# Patient Record
Sex: Male | Born: 1935 | Race: White | Hispanic: No | Marital: Married | Smoking: Former smoker
Health system: Southern US, Community
[De-identification: ages and names within clinical notes are randomized; demographics above are authoritative.]

## PROBLEM LIST (undated history)

## (undated) DIAGNOSIS — R252 Cramp and spasm: Secondary | ICD-10-CM

## (undated) DIAGNOSIS — I272 Pulmonary hypertension, unspecified: Secondary | ICD-10-CM

## (undated) DIAGNOSIS — I452 Bifascicular block: Secondary | ICD-10-CM

## (undated) DIAGNOSIS — R351 Nocturia: Secondary | ICD-10-CM

## (undated) DIAGNOSIS — B029 Zoster without complications: Secondary | ICD-10-CM

## (undated) DIAGNOSIS — I340 Nonrheumatic mitral (valve) insufficiency: Secondary | ICD-10-CM

## (undated) DIAGNOSIS — E785 Hyperlipidemia, unspecified: Secondary | ICD-10-CM

## (undated) DIAGNOSIS — I5020 Unspecified systolic (congestive) heart failure: Secondary | ICD-10-CM

## (undated) DIAGNOSIS — R7989 Other specified abnormal findings of blood chemistry: Secondary | ICD-10-CM

## (undated) DIAGNOSIS — H9190 Unspecified hearing loss, unspecified ear: Secondary | ICD-10-CM

## (undated) DIAGNOSIS — R001 Bradycardia, unspecified: Secondary | ICD-10-CM

## (undated) DIAGNOSIS — Z951 Presence of aortocoronary bypass graft: Secondary | ICD-10-CM

## (undated) DIAGNOSIS — R9430 Abnormal result of cardiovascular function study, unspecified: Secondary | ICD-10-CM

## (undated) DIAGNOSIS — R945 Abnormal results of liver function studies: Secondary | ICD-10-CM

## (undated) DIAGNOSIS — R42 Dizziness and giddiness: Secondary | ICD-10-CM

## (undated) DIAGNOSIS — I10 Essential (primary) hypertension: Secondary | ICD-10-CM

## (undated) DIAGNOSIS — R748 Abnormal levels of other serum enzymes: Secondary | ICD-10-CM

## (undated) DIAGNOSIS — E039 Hypothyroidism, unspecified: Secondary | ICD-10-CM

## (undated) DIAGNOSIS — I998 Other disorder of circulatory system: Secondary | ICD-10-CM

## (undated) DIAGNOSIS — I351 Nonrheumatic aortic (valve) insufficiency: Secondary | ICD-10-CM

## (undated) DIAGNOSIS — IMO0002 Reserved for concepts with insufficient information to code with codable children: Secondary | ICD-10-CM

## (undated) DIAGNOSIS — I219 Acute myocardial infarction, unspecified: Secondary | ICD-10-CM

## (undated) DIAGNOSIS — I2510 Atherosclerotic heart disease of native coronary artery without angina pectoris: Secondary | ICD-10-CM

## (undated) DIAGNOSIS — Z973 Presence of spectacles and contact lenses: Secondary | ICD-10-CM

## (undated) DIAGNOSIS — M1990 Unspecified osteoarthritis, unspecified site: Secondary | ICD-10-CM

## (undated) HISTORY — DX: Presence of aortocoronary bypass graft: Z95.1

## (undated) HISTORY — DX: Bifascicular block: I45.2

## (undated) HISTORY — DX: Pulmonary hypertension, unspecified: I27.20

## (undated) HISTORY — DX: Bradycardia, unspecified: R00.1

## (undated) HISTORY — DX: Atherosclerotic heart disease of native coronary artery without angina pectoris: I25.10

## (undated) HISTORY — DX: Unspecified systolic (congestive) heart failure: I50.20

## (undated) HISTORY — DX: Zoster without complications: B02.9

## (undated) HISTORY — DX: Nonrheumatic aortic (valve) insufficiency: I35.1

## (undated) HISTORY — DX: Nonrheumatic mitral (valve) insufficiency: I34.0

## (undated) HISTORY — DX: Hypothyroidism, unspecified: E03.9

## (undated) HISTORY — DX: Hyperlipidemia, unspecified: E78.5

## (undated) HISTORY — DX: Abnormal levels of other serum enzymes: R74.8

## (undated) HISTORY — DX: Cramp and spasm: R25.2

## (undated) HISTORY — DX: Reserved for concepts with insufficient information to code with codable children: IMO0002

## (undated) HISTORY — DX: Other specified abnormal findings of blood chemistry: R79.89

## (undated) HISTORY — DX: Dizziness and giddiness: R42

## (undated) HISTORY — PX: CARDIAC SURGERY: SHX584

## (undated) HISTORY — DX: Acute myocardial infarction, unspecified: I21.9

## (undated) HISTORY — DX: Essential (primary) hypertension: I10

## (undated) HISTORY — DX: Other disorder of circulatory system: I99.8

## (undated) HISTORY — DX: Abnormal results of liver function studies: R94.5

## (undated) HISTORY — DX: Abnormal result of cardiovascular function study, unspecified: R94.30

---

## 1994-05-17 HISTORY — PX: CORONARY ARTERY BYPASS GRAFT: SHX141

## 1997-05-17 HISTORY — PX: BACK SURGERY: SHX140

## 1997-10-22 ENCOUNTER — Inpatient Hospital Stay (HOSPITAL_COMMUNITY): Admission: RE | Admit: 1997-10-22 | Discharge: 1997-10-24 | Payer: Self-pay | Admitting: Neurosurgery

## 1998-01-29 ENCOUNTER — Encounter: Admission: RE | Admit: 1998-01-29 | Discharge: 1998-04-29 | Payer: Self-pay | Admitting: Anesthesiology

## 1998-03-07 ENCOUNTER — Encounter: Admission: RE | Admit: 1998-03-07 | Discharge: 1998-06-05 | Payer: Self-pay | Admitting: Anesthesiology

## 1998-05-01 ENCOUNTER — Encounter: Admission: RE | Admit: 1998-05-01 | Discharge: 1998-07-30 | Payer: Self-pay | Admitting: Anesthesiology

## 1998-05-17 HISTORY — PX: SPINAL FUSION: SHX223

## 2001-06-24 ENCOUNTER — Emergency Department (HOSPITAL_COMMUNITY): Admission: EM | Admit: 2001-06-24 | Discharge: 2001-06-24 | Payer: Self-pay | Admitting: Emergency Medicine

## 2001-06-24 ENCOUNTER — Encounter: Payer: Self-pay | Admitting: Emergency Medicine

## 2003-09-27 ENCOUNTER — Emergency Department (HOSPITAL_COMMUNITY): Admission: EM | Admit: 2003-09-27 | Discharge: 2003-09-28 | Payer: Self-pay | Admitting: Emergency Medicine

## 2004-03-12 ENCOUNTER — Encounter: Admission: RE | Admit: 2004-03-12 | Discharge: 2004-03-12 | Payer: Self-pay | Admitting: Internal Medicine

## 2004-06-09 ENCOUNTER — Ambulatory Visit: Payer: Self-pay | Admitting: Cardiology

## 2004-06-16 ENCOUNTER — Ambulatory Visit: Payer: Self-pay | Admitting: Cardiology

## 2004-07-20 ENCOUNTER — Ambulatory Visit: Payer: Self-pay | Admitting: Cardiology

## 2004-07-30 ENCOUNTER — Ambulatory Visit: Payer: Self-pay

## 2004-08-06 ENCOUNTER — Ambulatory Visit: Payer: Self-pay | Admitting: Cardiology

## 2004-08-20 ENCOUNTER — Ambulatory Visit: Payer: Self-pay | Admitting: Cardiology

## 2004-08-20 ENCOUNTER — Ambulatory Visit: Payer: Self-pay | Admitting: *Deleted

## 2004-08-20 ENCOUNTER — Ambulatory Visit (HOSPITAL_COMMUNITY): Admission: RE | Admit: 2004-08-20 | Discharge: 2004-08-20 | Payer: Self-pay | Admitting: Cardiology

## 2004-09-08 ENCOUNTER — Ambulatory Visit: Payer: Self-pay | Admitting: Cardiology

## 2004-12-01 ENCOUNTER — Ambulatory Visit: Payer: Self-pay | Admitting: Cardiology

## 2005-02-12 ENCOUNTER — Ambulatory Visit: Payer: Self-pay | Admitting: Internal Medicine

## 2005-04-05 ENCOUNTER — Ambulatory Visit: Payer: Self-pay | Admitting: Cardiology

## 2005-05-21 ENCOUNTER — Ambulatory Visit: Payer: Self-pay | Admitting: Cardiology

## 2005-07-13 ENCOUNTER — Ambulatory Visit: Payer: Self-pay | Admitting: Internal Medicine

## 2005-08-17 ENCOUNTER — Ambulatory Visit: Payer: Self-pay | Admitting: Cardiology

## 2005-09-21 ENCOUNTER — Ambulatory Visit: Payer: Self-pay | Admitting: Internal Medicine

## 2005-10-01 ENCOUNTER — Ambulatory Visit: Payer: Self-pay | Admitting: Internal Medicine

## 2005-10-14 ENCOUNTER — Ambulatory Visit: Payer: Self-pay | Admitting: Cardiology

## 2005-12-20 ENCOUNTER — Ambulatory Visit: Payer: Self-pay | Admitting: Cardiology

## 2006-07-13 ENCOUNTER — Ambulatory Visit: Payer: Self-pay | Admitting: Internal Medicine

## 2006-07-13 LAB — CONVERTED CEMR LAB
Creatinine,U: 90.6 mg/dL
Microalb Creat Ratio: 17.7 mg/g (ref 0.0–30.0)
Microalb, Ur: 1.6 mg/dL (ref 0.0–1.9)

## 2006-07-19 ENCOUNTER — Ambulatory Visit: Payer: Self-pay | Admitting: Internal Medicine

## 2006-11-22 ENCOUNTER — Ambulatory Visit: Payer: Self-pay | Admitting: Internal Medicine

## 2006-12-01 ENCOUNTER — Encounter (INDEPENDENT_AMBULATORY_CARE_PROVIDER_SITE_OTHER): Payer: Self-pay | Admitting: *Deleted

## 2006-12-12 ENCOUNTER — Ambulatory Visit: Payer: Self-pay | Admitting: Internal Medicine

## 2006-12-12 DIAGNOSIS — R42 Dizziness and giddiness: Secondary | ICD-10-CM

## 2007-01-11 ENCOUNTER — Encounter: Payer: Self-pay | Admitting: Internal Medicine

## 2007-01-13 ENCOUNTER — Telehealth (INDEPENDENT_AMBULATORY_CARE_PROVIDER_SITE_OTHER): Payer: Self-pay | Admitting: *Deleted

## 2007-01-17 ENCOUNTER — Encounter (INDEPENDENT_AMBULATORY_CARE_PROVIDER_SITE_OTHER): Payer: Self-pay | Admitting: *Deleted

## 2007-03-09 ENCOUNTER — Telehealth: Payer: Self-pay | Admitting: Internal Medicine

## 2007-03-09 ENCOUNTER — Inpatient Hospital Stay (HOSPITAL_COMMUNITY): Admission: EM | Admit: 2007-03-09 | Discharge: 2007-03-12 | Payer: Self-pay | Admitting: Emergency Medicine

## 2007-03-09 ENCOUNTER — Ambulatory Visit: Payer: Self-pay | Admitting: Cardiology

## 2007-03-14 ENCOUNTER — Ambulatory Visit: Payer: Self-pay | Admitting: Cardiology

## 2007-04-10 ENCOUNTER — Ambulatory Visit: Payer: Self-pay | Admitting: Cardiology

## 2007-06-23 ENCOUNTER — Ambulatory Visit: Payer: Self-pay | Admitting: Internal Medicine

## 2007-08-18 ENCOUNTER — Telehealth (INDEPENDENT_AMBULATORY_CARE_PROVIDER_SITE_OTHER): Payer: Self-pay | Admitting: *Deleted

## 2007-08-31 ENCOUNTER — Ambulatory Visit: Payer: Self-pay | Admitting: Cardiology

## 2007-09-26 ENCOUNTER — Telehealth (INDEPENDENT_AMBULATORY_CARE_PROVIDER_SITE_OTHER): Payer: Self-pay | Admitting: *Deleted

## 2007-10-02 ENCOUNTER — Ambulatory Visit: Payer: Self-pay | Admitting: Internal Medicine

## 2007-10-02 LAB — CONVERTED CEMR LAB
ALT: 27 units/L (ref 0–53)
AST: 24 units/L (ref 0–37)
Cholesterol: 135 mg/dL (ref 0–200)
Creatinine, Ser: 1.3 mg/dL (ref 0.4–1.5)
TSH: 3.96 microintl units/mL (ref 0.35–5.50)
Total CHOL/HDL Ratio: 4
Triglycerides: 66 mg/dL (ref 0–149)

## 2007-10-03 ENCOUNTER — Encounter: Payer: Self-pay | Admitting: Internal Medicine

## 2007-10-05 ENCOUNTER — Encounter (INDEPENDENT_AMBULATORY_CARE_PROVIDER_SITE_OTHER): Payer: Self-pay | Admitting: *Deleted

## 2007-10-16 ENCOUNTER — Ambulatory Visit: Payer: Self-pay | Admitting: Internal Medicine

## 2007-10-16 DIAGNOSIS — H40009 Preglaucoma, unspecified, unspecified eye: Secondary | ICD-10-CM | POA: Insufficient documentation

## 2007-11-16 ENCOUNTER — Ambulatory Visit: Payer: Self-pay | Admitting: Cardiology

## 2007-11-29 ENCOUNTER — Encounter: Payer: Self-pay | Admitting: Internal Medicine

## 2008-01-09 ENCOUNTER — Ambulatory Visit: Payer: Self-pay | Admitting: Internal Medicine

## 2008-01-10 LAB — CONVERTED CEMR LAB
Creatinine,U: 105.5 mg/dL
Potassium: 4 meq/L (ref 3.5–5.1)

## 2008-01-16 ENCOUNTER — Ambulatory Visit: Payer: Self-pay | Admitting: Internal Medicine

## 2008-01-16 DIAGNOSIS — E1365 Other specified diabetes mellitus with hyperglycemia: Secondary | ICD-10-CM

## 2008-01-16 DIAGNOSIS — E1351 Other specified diabetes mellitus with diabetic peripheral angiopathy without gangrene: Secondary | ICD-10-CM

## 2008-02-13 ENCOUNTER — Ambulatory Visit: Payer: Self-pay | Admitting: Cardiology

## 2008-02-13 LAB — CONVERTED CEMR LAB: Total CK: 149 units/L (ref 7–195)

## 2008-04-16 ENCOUNTER — Ambulatory Visit: Payer: Self-pay | Admitting: Internal Medicine

## 2008-04-23 ENCOUNTER — Ambulatory Visit: Payer: Self-pay | Admitting: Internal Medicine

## 2008-04-29 ENCOUNTER — Telehealth (INDEPENDENT_AMBULATORY_CARE_PROVIDER_SITE_OTHER): Payer: Self-pay | Admitting: *Deleted

## 2008-06-17 ENCOUNTER — Telehealth (INDEPENDENT_AMBULATORY_CARE_PROVIDER_SITE_OTHER): Payer: Self-pay | Admitting: *Deleted

## 2008-06-18 ENCOUNTER — Ambulatory Visit: Payer: Self-pay | Admitting: Internal Medicine

## 2008-06-19 ENCOUNTER — Ambulatory Visit: Payer: Self-pay | Admitting: Cardiology

## 2008-07-16 ENCOUNTER — Ambulatory Visit: Payer: Self-pay | Admitting: Internal Medicine

## 2008-07-16 DIAGNOSIS — T887XXA Unspecified adverse effect of drug or medicament, initial encounter: Secondary | ICD-10-CM | POA: Insufficient documentation

## 2008-07-23 LAB — CONVERTED CEMR LAB
Albumin: 4.2 g/dL (ref 3.5–5.2)
Basophils Absolute: 0 10*3/uL (ref 0.0–0.1)
Basophils Relative: 0.3 % (ref 0.0–3.0)
Cholesterol: 110 mg/dL (ref 0–200)
Creatinine,U: 90.5 mg/dL
Eosinophils Absolute: 0.1 10*3/uL (ref 0.0–0.7)
Hemoglobin: 13.5 g/dL (ref 13.0–17.0)
Hgb A1c MFr Bld: 6.2 % — ABNORMAL HIGH (ref 4.6–6.0)
MCHC: 34.2 g/dL (ref 30.0–36.0)
MCV: 93.6 fL (ref 78.0–100.0)
Microalb Creat Ratio: 110.5 mg/g — ABNORMAL HIGH (ref 0.0–30.0)
Microalb, Ur: 10 mg/dL — ABNORMAL HIGH (ref 0.0–1.9)
Neutro Abs: 4.2 10*3/uL (ref 1.4–7.7)
Neutrophils Relative %: 70.3 % (ref 43.0–77.0)
RBC: 4.22 M/uL (ref 4.22–5.81)
VLDL: 12 mg/dL (ref 0–40)

## 2008-07-24 ENCOUNTER — Encounter (INDEPENDENT_AMBULATORY_CARE_PROVIDER_SITE_OTHER): Payer: Self-pay | Admitting: *Deleted

## 2008-08-12 ENCOUNTER — Ambulatory Visit: Payer: Self-pay | Admitting: Internal Medicine

## 2008-08-12 DIAGNOSIS — D696 Thrombocytopenia, unspecified: Secondary | ICD-10-CM

## 2008-08-12 LAB — CONVERTED CEMR LAB: HDL goal, serum: 40 mg/dL

## 2008-10-04 ENCOUNTER — Encounter (INDEPENDENT_AMBULATORY_CARE_PROVIDER_SITE_OTHER): Payer: Self-pay | Admitting: *Deleted

## 2008-11-28 ENCOUNTER — Ambulatory Visit: Payer: Self-pay | Admitting: Cardiology

## 2008-11-28 DIAGNOSIS — R0602 Shortness of breath: Secondary | ICD-10-CM | POA: Insufficient documentation

## 2008-12-09 ENCOUNTER — Ambulatory Visit: Payer: Self-pay | Admitting: Internal Medicine

## 2008-12-15 LAB — CONVERTED CEMR LAB
CO2: 27 meq/L (ref 19–32)
Chloride: 107 meq/L (ref 96–112)
Sodium: 141 meq/L (ref 135–145)

## 2008-12-16 ENCOUNTER — Ambulatory Visit: Payer: Self-pay | Admitting: Internal Medicine

## 2009-02-01 ENCOUNTER — Encounter: Payer: Self-pay | Admitting: Cardiology

## 2009-02-03 ENCOUNTER — Ambulatory Visit: Payer: Self-pay | Admitting: Cardiology

## 2009-02-12 ENCOUNTER — Ambulatory Visit: Payer: Self-pay | Admitting: Internal Medicine

## 2009-02-12 DIAGNOSIS — M2550 Pain in unspecified joint: Secondary | ICD-10-CM | POA: Insufficient documentation

## 2009-02-12 DIAGNOSIS — IMO0001 Reserved for inherently not codable concepts without codable children: Secondary | ICD-10-CM

## 2009-02-14 ENCOUNTER — Telehealth (INDEPENDENT_AMBULATORY_CARE_PROVIDER_SITE_OTHER): Payer: Self-pay | Admitting: *Deleted

## 2009-02-14 LAB — CONVERTED CEMR LAB
ALT: 31 units/L (ref 0–53)
AST: 30 units/L (ref 0–37)
Alkaline Phosphatase: 61 units/L (ref 39–117)
Creatinine, Ser: 1.4 mg/dL (ref 0.4–1.5)
Eosinophils Relative: 3.2 % (ref 0.0–5.0)
HCT: 40.4 % (ref 39.0–52.0)
Hemoglobin: 13.9 g/dL (ref 13.0–17.0)
Hgb A1c MFr Bld: 6.7 % — ABNORMAL HIGH (ref 4.6–6.5)
Lymphocytes Relative: 27 % (ref 12.0–46.0)
Lymphs Abs: 1.4 10*3/uL (ref 0.7–4.0)
Monocytes Relative: 17.3 % — ABNORMAL HIGH (ref 3.0–12.0)
Neutro Abs: 2.7 10*3/uL (ref 1.4–7.7)
Platelets: 119 10*3/uL — ABNORMAL LOW (ref 150.0–400.0)
Rhuematoid fact SerPl-aCnc: 24.4 intl units/mL — ABNORMAL HIGH (ref 0.0–20.0)
Total Bilirubin: 0.8 mg/dL (ref 0.3–1.2)
Total CK: 153 units/L (ref 7–232)
Vit D, 25-Hydroxy: 25 ng/mL — ABNORMAL LOW (ref 30–89)
WBC: 5.2 10*3/uL (ref 4.5–10.5)

## 2009-02-15 ENCOUNTER — Ambulatory Visit: Payer: Self-pay | Admitting: Internal Medicine

## 2009-02-15 DIAGNOSIS — B029 Zoster without complications: Secondary | ICD-10-CM | POA: Insufficient documentation

## 2009-02-17 ENCOUNTER — Ambulatory Visit: Payer: Self-pay | Admitting: Internal Medicine

## 2009-02-17 DIAGNOSIS — E559 Vitamin D deficiency, unspecified: Secondary | ICD-10-CM | POA: Insufficient documentation

## 2009-02-18 ENCOUNTER — Ambulatory Visit: Payer: Self-pay | Admitting: Cardiology

## 2009-02-25 ENCOUNTER — Telehealth (INDEPENDENT_AMBULATORY_CARE_PROVIDER_SITE_OTHER): Payer: Self-pay | Admitting: *Deleted

## 2009-03-10 ENCOUNTER — Telehealth (INDEPENDENT_AMBULATORY_CARE_PROVIDER_SITE_OTHER): Payer: Self-pay | Admitting: *Deleted

## 2009-03-14 ENCOUNTER — Ambulatory Visit: Payer: Self-pay | Admitting: Internal Medicine

## 2009-03-15 LAB — CONVERTED CEMR LAB
BUN: 22 mg/dL (ref 6–23)
Basophils Absolute: 0 10*3/uL (ref 0.0–0.1)
Bilirubin, Direct: 0.1 mg/dL (ref 0.0–0.3)
Creatinine, Ser: 1.26 mg/dL (ref 0.40–1.50)
HCT: 38.4 % — ABNORMAL LOW (ref 39.0–52.0)
Indirect Bilirubin: 0.4 mg/dL (ref 0.0–0.9)
Lymphocytes Relative: 34 % (ref 12–46)
Lymphs Abs: 2.2 10*3/uL (ref 0.7–4.0)
Neutro Abs: 3.4 10*3/uL (ref 1.7–7.7)
Neutrophils Relative %: 51 % (ref 43–77)
Platelets: 187 10*3/uL (ref 150–400)
RDW: 14.3 % (ref 11.5–15.5)
Total Bilirubin: 0.5 mg/dL (ref 0.3–1.2)
WBC: 6.6 10*3/uL (ref 4.0–10.5)

## 2009-03-17 ENCOUNTER — Encounter (INDEPENDENT_AMBULATORY_CARE_PROVIDER_SITE_OTHER): Payer: Self-pay | Admitting: *Deleted

## 2009-03-17 ENCOUNTER — Encounter: Admission: RE | Admit: 2009-03-17 | Discharge: 2009-03-17 | Payer: Self-pay | Admitting: Internal Medicine

## 2009-03-19 ENCOUNTER — Telehealth (INDEPENDENT_AMBULATORY_CARE_PROVIDER_SITE_OTHER): Payer: Self-pay | Admitting: *Deleted

## 2009-03-26 ENCOUNTER — Ambulatory Visit: Payer: Self-pay | Admitting: Internal Medicine

## 2009-03-26 LAB — CONVERTED CEMR LAB
OCCULT 1: NEGATIVE
OCCULT 3: NEGATIVE

## 2009-03-27 ENCOUNTER — Encounter (INDEPENDENT_AMBULATORY_CARE_PROVIDER_SITE_OTHER): Payer: Self-pay | Admitting: *Deleted

## 2009-04-01 ENCOUNTER — Telehealth: Payer: Self-pay | Admitting: Internal Medicine

## 2009-04-01 ENCOUNTER — Ambulatory Visit: Payer: Self-pay | Admitting: Internal Medicine

## 2009-04-01 LAB — CONVERTED CEMR LAB
BUN: 17 mg/dL (ref 6–23)
Basophils Absolute: 0 10*3/uL (ref 0.0–0.1)
Creatinine, Ser: 1.3 mg/dL (ref 0.4–1.5)
Eosinophils Relative: 2.1 % (ref 0.0–5.0)
HCT: 37.6 % — ABNORMAL LOW (ref 39.0–52.0)
Lymphs Abs: 2 10*3/uL (ref 0.7–4.0)
Monocytes Absolute: 0.9 10*3/uL (ref 0.1–1.0)
Monocytes Relative: 11.5 % (ref 3.0–12.0)
Neutrophils Relative %: 58.6 % (ref 43.0–77.0)
Platelets: 176 10*3/uL (ref 150.0–400.0)
RDW: 15 % — ABNORMAL HIGH (ref 11.5–14.6)
WBC: 7.4 10*3/uL (ref 4.5–10.5)

## 2009-04-02 ENCOUNTER — Encounter (INDEPENDENT_AMBULATORY_CARE_PROVIDER_SITE_OTHER): Payer: Self-pay | Admitting: *Deleted

## 2009-04-04 ENCOUNTER — Ambulatory Visit: Payer: Self-pay | Admitting: Cardiology

## 2009-04-07 ENCOUNTER — Telehealth (INDEPENDENT_AMBULATORY_CARE_PROVIDER_SITE_OTHER): Payer: Self-pay | Admitting: *Deleted

## 2009-04-15 ENCOUNTER — Ambulatory Visit: Payer: Self-pay | Admitting: Cardiology

## 2009-04-17 ENCOUNTER — Ambulatory Visit: Payer: Self-pay | Admitting: Internal Medicine

## 2009-04-17 LAB — CONVERTED CEMR LAB
OCCULT 2: NEGATIVE
OCCULT 3: NEGATIVE

## 2009-04-18 ENCOUNTER — Encounter (INDEPENDENT_AMBULATORY_CARE_PROVIDER_SITE_OTHER): Payer: Self-pay | Admitting: *Deleted

## 2009-04-22 ENCOUNTER — Encounter: Payer: Self-pay | Admitting: Internal Medicine

## 2009-04-29 ENCOUNTER — Telehealth: Payer: Self-pay | Admitting: Internal Medicine

## 2009-04-29 DIAGNOSIS — B0229 Other postherpetic nervous system involvement: Secondary | ICD-10-CM

## 2009-05-13 ENCOUNTER — Encounter: Payer: Self-pay | Admitting: Internal Medicine

## 2009-06-06 ENCOUNTER — Telehealth (INDEPENDENT_AMBULATORY_CARE_PROVIDER_SITE_OTHER): Payer: Self-pay | Admitting: *Deleted

## 2009-07-07 ENCOUNTER — Telehealth (INDEPENDENT_AMBULATORY_CARE_PROVIDER_SITE_OTHER): Payer: Self-pay | Admitting: *Deleted

## 2009-07-07 ENCOUNTER — Ambulatory Visit: Payer: Self-pay | Admitting: Internal Medicine

## 2009-07-29 ENCOUNTER — Ambulatory Visit: Payer: Self-pay | Admitting: Cardiology

## 2009-09-02 ENCOUNTER — Telehealth (INDEPENDENT_AMBULATORY_CARE_PROVIDER_SITE_OTHER): Payer: Self-pay | Admitting: *Deleted

## 2009-09-08 ENCOUNTER — Ambulatory Visit: Payer: Self-pay | Admitting: Internal Medicine

## 2009-09-17 ENCOUNTER — Ambulatory Visit: Payer: Self-pay | Admitting: Internal Medicine

## 2009-10-01 ENCOUNTER — Telehealth (INDEPENDENT_AMBULATORY_CARE_PROVIDER_SITE_OTHER): Payer: Self-pay | Admitting: *Deleted

## 2009-12-25 ENCOUNTER — Ambulatory Visit: Payer: Self-pay | Admitting: Cardiology

## 2009-12-31 ENCOUNTER — Telehealth: Payer: Self-pay | Admitting: Internal Medicine

## 2010-01-12 ENCOUNTER — Ambulatory Visit: Payer: Self-pay | Admitting: Internal Medicine

## 2010-01-13 LAB — CONVERTED CEMR LAB
Alkaline Phosphatase: 43 units/L (ref 39–117)
BUN: 25 mg/dL — ABNORMAL HIGH (ref 6–23)
Bilirubin, Direct: 0.2 mg/dL (ref 0.0–0.3)
Creatinine, Ser: 1.5 mg/dL (ref 0.4–1.5)
HDL: 34.2 mg/dL — ABNORMAL LOW (ref >39.00)
Hgb A1c MFr Bld: 7.2 % — ABNORMAL HIGH (ref 4.6–6.5)
LDL Cholesterol: 91 mg/dL (ref 0–99)
Total Bilirubin: 0.8 mg/dL (ref 0.3–1.2)
Total CHOL/HDL Ratio: 4
Total Protein: 6.3 g/dL (ref 6.0–8.3)
Triglycerides: 91 mg/dL (ref 0.0–149.0)

## 2010-01-22 ENCOUNTER — Ambulatory Visit: Payer: Self-pay | Admitting: Internal Medicine

## 2010-02-09 ENCOUNTER — Encounter: Payer: Self-pay | Admitting: Internal Medicine

## 2010-02-14 DIAGNOSIS — B029 Zoster without complications: Secondary | ICD-10-CM

## 2010-02-14 HISTORY — DX: Zoster without complications: B02.9

## 2010-03-05 ENCOUNTER — Ambulatory Visit: Payer: Self-pay | Admitting: Internal Medicine

## 2010-04-02 ENCOUNTER — Ambulatory Visit: Payer: Self-pay | Admitting: Cardiology

## 2010-04-16 ENCOUNTER — Ambulatory Visit: Payer: Self-pay | Admitting: Internal Medicine

## 2010-04-20 LAB — CONVERTED CEMR LAB
Microalb Creat Ratio: 2.5 mg/g (ref 0.0–30.0)
Vit D, 25-Hydroxy: 43 ng/mL (ref 30–89)

## 2010-04-23 ENCOUNTER — Ambulatory Visit: Payer: Self-pay | Admitting: Internal Medicine

## 2010-05-05 ENCOUNTER — Telehealth: Payer: Self-pay | Admitting: Adult Health

## 2010-06-14 LAB — CONVERTED CEMR LAB
PSA: 1.34 ng/mL (ref 0.10–4.00)
Potassium: 4.8 meq/L (ref 3.5–5.1)
TSH: 3.65 microintl units/mL (ref 0.35–5.50)

## 2010-06-16 NOTE — Assessment & Plan Note (Signed)
Summary: per check out/saf      Allergies Added:   Visit Type:  Follow-up Primary Provider:  Burke Keels, MD  CC:  CAD.  History of Present Illness: The patient is seen for followup of coronary artery disease.  Fortunately his episode of shingles has resolved completely.  He is doing well.  He's not having any significant chest pain or shortness of breath.  Current Medications (verified): 1)  Levothyroxine Sodium 25 Mcg Tabs (Levothyroxine Sodium) .Marland Kitchen.. 1 By Mouth Qd 2)  Benazepril Hcl 40 Mg  Tabs (Benazepril Hcl) .... Once Daily 3)  Clonidine Hcl 0.1 Mg  Tabs (Clonidine Hcl) .Marland Kitchen.. 1 Am, 2 Evening 4)  Metformin Hcl 500 Mg Tabs (Metformin Hcl) .... Take One Tablet Two Times A Day With Largest Meals 5)  Accu-Chek Active   Strp (Glucose Blood) .... Use One Strip Daily To Test For Sugar Level 250.00 6)  Accu-Chek Soft Touch Lancets   Misc (Lancets) .... Use Daily To Check Sugar Level 7)  Carvedilol 3.125 Mg Tabs (Carvedilol) .... Take One Tablet By Mouth Twice A Day 8)  Glimepiride 2 Mg  Tabs (Glimepiride) .... 1/2 Q Am 9)  Bayer Aspirin 325 Mg Tabs (Aspirin) .Marland Kitchen.. 1 By Mouth Once Daily 10)  Vitamin D (Ergocalciferol) 50000 Unit Caps (Ergocalciferol) .Marland Kitchen.. 1 Pill Once Weekly- Due For Lab 11)  Acetaminophen 650 Mg  Tbcr (Acetaminophen) .... As Needed  Allergies (verified): 1)  ! Folic Acid 2)  ! Cardene  Past History:  Past Medical History: CAD.Marland KitchenMarland KitchenMarland KitchenBare metal stent SVG  Cx  2008 CABG Diabetes mellitus, type II Hyperlipidemia Hypertension Hypothyroidism Dizziness...mild Bradycardia...sinus RBBB.Marland Kitchen new November 28, 2008 Shortness of breath Leg and back cramps... mostly nighttime Shingles... with severe pain in the right flank....symptoms resolved. Question of swelling in the area of the shingles in the right flank.  Review of Systems       Patient denies fever, chills, headache, sweats, rash, change in vision, change in hearing, chest pain, cough, shortness of breath, nausea  vomiting, urinary symptoms.all other systems are reviewed and are negative  Vital Signs:  Patient profile:   77 year old male Height:      69 inches Weight:      185 pounds BMI:     27.42 Pulse rate:   60 / minute BP sitting:   146 / 82  (left arm) Cuff size:   regular  Vitals Entered By: Hardin Negus, RMA (July 29, 2009 9:25 AM)  Physical Exam  General:  patient looks quite good today. Eyes:  no xanthelasma. Neck:  no jugular venous distention. Lungs:  lungs are clear.  Respiratory effort is nonlabored. Heart:  cardiac exam reveals S1-S2.  No clicks or significant murmurs. Abdomen:  abdomen is soft. Extremities:  no peripheral edema. Psych:  patient is oriented to person time and place.  Affect is normal.   Impression & Recommendations:  Problem # 1:  POSTHERPETIC NEURALGIA (ICD-053.19) his post herpetic neuralgia is improved.  He is doing very well.  Problem # 2:  SHORTNESS OF BREATH (ICD-786.05) Breathing is stable.  No further workup.  Problem # 3:  BRADYCARDIA (ICD-427.89) With the adjustments we made in his medications his heart rate is stable.  Heart rate is 60.  No change in therapy.  Problem # 4:  HYPERTENSION (ICD-401.9) Blood pressure is under good control.  No change in therapy.  Problem # 5:  CORONARY ARTERY DISEASE (ICD-414.00) Coronary disease is stable.  No change in therapy.  See him  back in August, 2011.  Patient Instructions: 1)  Follow up in August   Visit Type:  Follow-up Primary Provider:  Burke Keels, MD  CC:  CAD.  History of Present Illness: The patient is seen for followup of coronary artery disease.  Fortunately his episode of shingles has resolved completely.  He is doing well.  He's not having any significant chest pain or shortness of breath.

## 2010-06-16 NOTE — Assessment & Plan Note (Signed)
Summary: review lab,diabetic check/cbs   Vital Signs:  Patient profile:   77 year old male Weight:      185.8 pounds Pulse rate:   56 / minute Resp:     17 per minute BP sitting:   148 / 80  (left arm) Cuff size:   large  Vitals Entered By: Shonna Chock (Sep 17, 2009 10:02 AM) CC: Follow-up visit: discuss labs (copy given), Hypertension Management Comments REVIEWED MED LIST, PATIENT AGREED DOSE AND INSTRUCTION CORRECT  **[facility] D [facility] per Dr.Iliyana Convey   Primary Care Provider:  Burke Keels, MD  CC:  Follow-up visit: discuss labs (copy given) and Hypertension Management.  History of Present Illness: Labs reviewed & risks discussed; A1c is @ goal . LDL minimal goal = < 100, ideally < 70 in context of  DM & PMH of CBAG & stents. Modified heart healthy diet;he has continued to drink Ensure daily since his shingles. He walks 1 mpd  2X/ week w/o symptoms.He D/Ced statin due to muscle pain.Chest wall pain  when supine @ night with changes in temp & humidity.AS Tylenol relieves that chest pain .See BP ;BP averages 142/74 @ home by hx.  Hypertension History:      He complains of chest pain, but denies headache, palpitations, dyspnea with exertion, orthopnea, PND, peripheral edema, visual symptoms, neurologic problems, and syncope.        Positive major cardiovascular risk factors include male age 84 years old or older, diabetes, hyperlipidemia, and hypertension.  Negative major cardiovascular risk factors include non-tobacco-user status.        Positive history for target organ damage include ASHD (either angina/prior MI/prior CABG).     Allergies: 1)  ! Folic Acid 2)  ! Cardene 3)  ! Lipitor (Atorvastatin Calcium)  Past History:  Past Medical History: CAD; S/P Bare metal stent SVG  Cx  2008 CABG Diabetes mellitus, type II Hyperlipidemia Hypertension Hypothyroidism Bradycardia...sinus RBBB.Marland Kitchen new November 28, 2008 Leg and back cramps... mostly nighttime Shingles  manifested by  severe pain in the right flank & subjective  swelling  Review of Systems Eyes:  Denies blurring, double vision, and vision loss-both eyes. CV:  Complains of lightheadness; denies leg cramps with exertion and near fainting; Occasional positional symptoms. Neuro:  Denies numbness, tingling, and weakness.  Physical Exam  General:  well-nourished,appears younger than age; alert,appropriate and cooperative throughout examination Chest Wall:  no tenderness.   Lungs:  Normal respiratory effort, chest expands symmetrically. Lungs are clear to auscultation, no crackles or wheezes. Heart:  normal rate, regular rhythm, no gallop, no rub, no JVD, and grade 1 /6 systolic murmur.   Pulses:  R and L carotid,radial,dorsalis pedis and posterior tibial pulses are full and equal bilaterally Extremities:  No clubbing, cyanosis, edema. Minor DIP finger  deformities  noted with normal full range of motion of all joints.   Neurologic:  alert & oriented X3.   Skin:  Intact without suspicious lesions or rashes Cervical Nodes:  No lymphadenopathy noted Axillary Nodes:  No palpable lymphadenopathy Psych:  memory intact for recent and remote, normally interactive, and good eye contact.     Impression & Recommendations:  Problem # 1:  DIABETES MELLITUS, CONTROLLED (ICD-250.00) adequate control His updated medication list for this problem includes:    Benazepril Hcl 40 Mg Tabs (Benazepril hcl) ..... Once daily    Metformin Hcl 500 Mg Tabs (Metformin hcl) .Marland Kitchen... Take one tablet two times a day with largest meals  Glimepiride 2 Mg Tabs (Glimepiride) .Marland Kitchen... 1/2 q am, lab appointment due    Bayer Aspirin 325 Mg Tabs (Aspirin) .Marland Kitchen... 1 by mouth once daily  Problem # 2:  HYPERTENSION (ICD-401.9)  not controlled His updated medication list for this problem includes:    Benazepril Hcl 40 Mg Tabs (Benazepril hcl) ..... Once daily    Clonidine Hcl 0.1 Mg Tabs (Clonidine hcl) .Marland Kitchen... 1 am, 2  evening  Orders: Prescription Created Electronically [phone])  Problem # 3:  CHEST PAIN (ICD-786.50) chest wall, non coronary artery pain suggested  Problem # 4:  VITAMIN D DEFICIENCY (ICD-268.9) corrected  Complete Medication List: 1)  Levothyroxine Sodium 25 Mcg Tabs (Levothyroxine sodium) .Marland Kitchen.. 1 by mouth qd 2)  Benazepril Hcl 40 Mg Tabs (Benazepril hcl) .... Once daily 3)  Clonidine Hcl 0.1 Mg Tabs (Clonidine hcl) .Marland Kitchen.. 1 am, 2 evening 4)  Metformin Hcl 500 Mg Tabs (Metformin hcl) .... Take one tablet two times a day with largest meals 5)  Accu-chek Active Strp (Glucose blood) .... Use one strip daily to test for sugar level 250.00 6)  Accu-chek Soft Touch Lancets Misc (Lancets) .... Use daily to check sugar level 7)  Carvedilol 6.25 Mg  .... Take one tablet by mouth twice a day 8)  Glimepiride 2 Mg Tabs (Glimepiride) .... 1/2 q am, lab appointment due 9)  Bayer Aspirin 325 Mg Tabs (Aspirin) .Marland Kitchen.. 1 by mouth once daily 10)  Vitamin D (ergocalciferol) 1000 Unit Caps (ergocalciferol)  .... 2 pills once daily 11)  Acetaminophen 650 Mg Tbcr (Acetaminophen) .... As needed 12)  Pravastatin Sodium 20 Mg Tabs (Pravastatin sodium) .Marland Kitchen.. 1 at bedtime  Hypertension Assessment/Plan:      The patient's hypertensive risk group is category C: Target organ damage and/or diabetes.  Today's blood pressure is 148/80.    Patient Instructions: 1)  Please schedule a follow-up appointment in 3 months. 2)  BUN,creat, K+ prior to visit, ICD-9:401.9 3)  Hepatic Panel ; CPK prior to visit, ICD-9:995.20 4)  Lipid Panel prior to visit, ICD-9:272.4 5)  HbgA1C prior to visit, ICD-9:250.00; vitamin D level: 268.9 Prescriptions: PRAVASTATIN SODIUM 20 MG TABS (PRAVASTATIN SODIUM) 1 at bedtime  #90 x 0   Entered and Authorized by:   Marga Melnick MD   Signed by:   Marga Melnick MD on 09/17/2009   Method used:   Print then Give to Patient   RxID:   [id number] CARVEDILOL 6.25 MG Take one tablet by mouth  twice a day  #180 x 1   Entered and Authorized by:   Marga Melnick MD   Signed by:   Marga Melnick MD on 09/17/2009   Method used:   [facility] ...       [facility]  [address] [address] [id number]       [address]       [place], [place]  [address]       Ph: [phone] or [phone]       Fax: [phone]   RxID:   [id number] VITAMIN D (ERGOCALCIFEROL) 50000 UNIT CAPS (ERGOCALCIFEROL) 1 pill ONCE WEEKLY-  #12 x 3   Entered by:   Shonna Chock   Authorized by:   Marga Melnick MD   Signed by:   Shonna Chock on 09/17/2009   Method used:   [facility]        [facility]  [address] [phone][facility]ail[facility]  [address]       [place], [place]  [address]       Ph: [phone] or [phone]       Fax: [phone]   RxID:   [id number]

## 2010-06-16 NOTE — Progress Notes (Signed)
Summary: RX  Phone Note Call from Patient Call back at Home Phone [phone]   Caller: Spouse Summary of Call: PT WIFE IS CALLING ASKING [place] TO SEND [facility] [address] [facility] [place]--[phone]. NAME GLIMEPIRIDE 2 MG, PRAVASTATAIN  Initial call taken by: Freddy Jaksch,  December 31, 2009 9:38 AM  Follow-up for Phone Call        Spouse notified and will schedule appt. Follow-up by: Lucious Groves CMA,  December 31, 2009 11:40 AM    Prescriptions: PRAVASTATIN SODIUM 20 MG TABS (PRAVASTATIN SODIUM) 1 at bedtime, **APPOINTMENT DUE**  #30 x 0   Entered by:   Lucious Groves CMA   Authorized by:   Marga Melnick MD   Signed by:   Lucious Groves CMA on 12/31/2009   Method used:   [facility]        [facility]  [address] [id number]       [address]       [place], [place]  [address]       Ph: [phone] or [phone]       Fax: [phone]   RxID:   [id number] GLIMEPIRIDE 2 MG  TABS (GLIMEPIRIDE) 1/2 q am  #30 x 0   Entered by:   Lucious Groves CMA   Authorized by:   Marga Melnick MD   Signed by:   Lucious Groves CMA on 12/31/2009   Method used:   [facility]        [facility]  [address] [address] [id number]       [address]       [place], [place]  [address]       Ph: [phone] or [phone]       Fax: [phone]   RxID:   [id number]

## 2010-06-16 NOTE — Progress Notes (Signed)
Summary: Refill Request/Appointment Due  Phone Note Refill Request Message from:  Fax from Pharmacy on September 02, 2009 2:07 PM  Refills Requested: Medication #1:  METFORMIN HCL 500 MG TABS take one tablet two times a day with largest meals [facility] [address] [place] fax [phone]   Method Requested: Fax to Local Pharmacy Next Appointment Scheduled: no appt Initial call taken by: Barb Merino,  September 02, 2009 2:09 PM  Follow-up for Phone Call        Patient is due for labs please call to schedule: Copied and pasted from 02/2009 visit Patient Instructions: 1)  Please schedule a follow-up appointment in 3 months. 2)  NMR Lipoprofile Lipid Panel prior to visit, ICD-9:272.4; vitamin D level,268.9; 3)  HbgA1C prior to visit, ICD-9:250.00. Less than 40 grams of sugar (< 10 tsp ) / day . Consider The New Sugar Busters Follow-up by: Shonna Chock,  September 02, 2009 2:50 PM  Additional Follow-up for Phone Call Additional follow up Details #1::        patient is scheduled for lab [phone] & ov [id number]  Additional Follow-up by: Okey Regal [place],  September 03, 2009 12:43 PM    Prescriptions: METFORMIN HCL 500 MG TABS (METFORMIN HCL) take one tablet two times a day with largest meals  #180 Each x 0   Entered by:   Shonna Chock   Authorized by:   Marga Melnick MD   Signed by:   Shonna Chock on 09/02/2009   Method used:   [facility]        [facility]  [address] [id number]       [address]       [place], [place]  [address]       Ph: [phone] or [phone]       Fax: [phone]   RxID:   [id number]

## 2010-06-16 NOTE — Assessment & Plan Note (Signed)
Summary: DISCUSS THE LABS//PH   Vital Signs:  Patient profile:   77 year old male Weight:      186.6 pounds BMI:     27.66 Temp:     97.7 degrees F oral Pulse rate:   72 / minute Resp:     17 per minute BP sitting:   148 / 86  (left arm) Cuff size:   large  Vitals Entered By: Shonna Chock CMA (January 22, 2010 11:45 AM) CC: Follow-up visit: discuss labs (copy given), Type 2 diabetes mellitus follow-up   Primary Care Provider:  Burke Keels, MD  CC:  Follow-up visit: discuss labs (copy given) and Type 2 diabetes mellitus follow-up.  History of Present Illness: Type 2 Diabetes Mellitus Follow-Up      This is an 77 year old man who presents for Type 2 diabetes mellitus follow-up.  The patient reports polyuria, but denies polydipsia, blurred vision, self managed hypoglycemia, weight loss, weight gain, and numbness of extremities.  Other symptoms include orthostatic symptoms occasionally .  The patient denies the following symptoms: neuropathic pain, chest pain, vomiting, poor wound healing, intermittent claudication, vision loss, and foot ulcer.  Since the last visit the patient reports poor dietary compliance( "I was eating ice cream a lot; I've quit") and compliance with medications.  The patient has been measuring capillary blood glucose before breakfast ,148- 158.  He has an eye care appt with his Ohthalmologist in near future. A1c was 7.2%. Hyperlipidemia Follow-Up      The patient also presents for Hyperlipidemia follow-up.  The patient denies muscle aches, GI upset, abdominal pain, flushing, itching, constipation, and diarrhea.  The patient denies the following symptoms: dypsnea, palpitations, syncope, and pedal edema.  Compliance with medications (by patient report) has been near 100%.  The patient reports exercising occasionally.  Adjunctive measures currently used by the patient include ASA and fish oil supplements.  Lipids are @ goal.                                                                  Vitamin D level 31 on 2000 International Units of vit D3 once daily .  Current Medications (verified): 1)  Levothyroxine Sodium 25 Mcg Tabs (Levothyroxine Sodium) .Marland Kitchen.. 1 By Mouth Qd 2)  Benazepril Hcl 40 Mg  Tabs (Benazepril Hcl) .... Once Daily 3)  Clonidine Hcl 0.1 Mg  Tabs (Clonidine Hcl) .Marland Kitchen.. 1 Am, 2 Evening 4)  Metformin Hcl 500 Mg Tabs (Metformin Hcl) .... Take One Tablet Two Times A Day With Largest Meals 5)  Accu-Chek Active   Strp (Glucose Blood) .... Use One Strip Daily To Test For Sugar Level 250.00 6)  Accu-Chek Soft Touch Lancets   Misc (Lancets) .... Use Daily To Check Sugar Level 7)  Glimepiride 2 Mg  Tabs (Glimepiride) .... 1/2 Q Am 8)  Bayer Aspirin 325 Mg Tabs (Aspirin) .Marland Kitchen.. 1 By Mouth Once Daily 9)  Vitamin D (Ergocalciferol) 1000 Unit Caps (Ergocalciferol) .... 2 Pills Once Daily 10)  Acetaminophen 650 Mg  Tbcr (Acetaminophen) .... As Needed 11)  Pravastatin Sodium 20 Mg Tabs (Pravastatin Sodium) .Marland Kitchen.. 1 At Bedtime, **appointment Due** 12)  Carvedilol 3.125 Mg Tabs (Carvedilol) .... Take One Tablet By Mouth Every Evening  Allergies: 1)  ! Folic  Acid 2)  ! Cardene 3)  ! Lipitor (Atorvastatin Calcium)  Physical Exam  General:  Appears younger than age,in no acute distress; alert,appropriate and cooperative throughout examination Lungs:  Normal respiratory effort, chest expands symmetrically. Lungs are clear to auscultation, no crackles or wheezes. Heart:  normal rate, regular rhythm, no gallop, no rub, no JVD, and grade 1 /6 systolic murmur.   Pulses:  R and L carotid,radial,dorsalis pedis and posterior tibial pulses are full and equal bilaterally Extremities:  No clubbing, cyanosis, edema. Neurologic:  alert & oriented X3.   Skin:  Intact without suspicious lesions or rashes Psych:  memory intact for recent and remote, normally interactive, and good eye contact.     Impression & Recommendations:  Problem # 1:  DIABETES MELLITUS, UNCONTROLLED  (ICD-250.02) Apparently due to dietary indiscretion His updated medication list for this problem includes:    Benazepril Hcl 40 Mg Tabs (Benazepril hcl) ..... Once daily    Metformin Hcl 500 Mg Tabs (Metformin hcl) .Marland Kitchen... Take one tablet two times a day with largest meals    Glimepiride 2 Mg Tabs (Glimepiride) .Marland Kitchen... 1/2 q am    Bayer Aspirin 325 Mg Tabs (Aspirin) .Marland Kitchen... 1 by mouth once daily  Problem # 2:  HYPERLIPIDEMIA (ICD-272.4) Lipids @ goal except HDL < 40 His updated medication list for this problem includes:    Pravastatin Sodium 20 Mg Tabs (Pravastatin sodium)   Problem # 3:  VITAMIN D DEFICIENCY (ICD-268.9)  Complete Medication List: 1)  Levothyroxine Sodium 25 Mcg Tabs (Levothyroxine sodium) .Marland Kitchen.. 1 by mouth qd 2)  Benazepril Hcl 40 Mg Tabs (Benazepril hcl) .... Once daily 3)  Clonidine Hcl 0.1 Mg Tabs (Clonidine hcl) .Marland Kitchen.. 1 am, 2 evening 4)  Metformin Hcl 500 Mg Tabs (Metformin hcl) .... Take one tablet two times a day with largest meals 5)  Accu-chek Active Strp (Glucose blood) .... Use one strip daily to test for sugar level 250.00 6)  Accu-chek Soft Touch Lancets Misc (Lancets) .... Use daily to check sugar level 7)  Glimepiride 2 Mg Tabs (Glimepiride) .... 1/2 q am 8)  Bayer Aspirin 325 Mg Tabs (Aspirin) .Marland Kitchen.. 1 by mouth once daily 9)  Vitamin D (ergocalciferol) 1000 Unit Caps (ergocalciferol)  .... 2 pills once daily except 3 on t,th & sat 10)  Acetaminophen 650 Mg Tbcr (Acetaminophen) .... As needed 11)  Pravastatin Sodium 20 Mg Tabs (Pravastatin sodium) .Marland Kitchen.. 1 at bedtime 12)  Carvedilol 3.125 Mg Tabs (Carvedilol) .... Take one tablet by mouth every evening  Patient Instructions: 1)  Consume LESS THAN 40 grams of HIgh Fructose Corn Syrup sugar / day. Isometric exercises pre standing ; support hose if no better. 2)  Please schedule a follow-up appointment in 3 months.Vit D level ,268.9. 3)  HbgA1C prior to visit, ICD-9:250.02 4)  Urine Microalbumin prior to visit,  ICD-9:250.02 Prescriptions: PRAVASTATIN SODIUM 20 MG TABS (PRAVASTATIN SODIUM) 1 at bedtime  #90 x 3   Entered and Authorized by:   Marga Melnick MD   Signed by:   Marga Melnick MD on 01/22/2010   Method used:   Print then Give to Patient   RxID:   [id number] ACCU-CHEK SOFT TOUCH LANCETS   MISC (LANCETS) use daily to check sugar level  #100 x 3   Entered by:   Shonna Chock CMA   Authorized by:   Marga Melnick MD   Signed by:   Shonna Chock CMA on 01/22/2010   Method used:   Electronically to  [facility]* (retail)       [address]       [place], [place]  [address]       Ph: [phone]       Fax: [phone]   RxID:   [id number] ACCU-CHEK ACTIVE   STRP (GLUCOSE BLOOD) use one strip daily to test for sugar level 250.00  #100 x 3   Entered by:   Shonna Chock CMA   Authorized by:   Marga Melnick MD   Signed by:   Shonna Chock CMA on 01/22/2010   Method used:   Electronically to        [facility]* (retail)       [address]       [place], [place]  [address]       Ph: [phone]       Fax: [phone]   RxID:   [id number]

## 2010-06-16 NOTE — Progress Notes (Signed)
  Phone Note Refill Request Message from:  Patient  Refills Requested: Medication #1:  GABAPENTIN 300 MG CAPS Take 1 cap  q 8 hrs as needed. pt called says he is out of meds need refill  Initial call taken by: Kandice Hams,  June 06, 2009 1:44 PM  Follow-up for Phone Call        left msg for pt rx sent to [facility], also referred to Pain Mgmt, referral ccordinator checking on status of appt .Kandice Hams  June 06, 2009 2:15 PM  Follow-up by: Kandice Hams,  June 06, 2009 2:15 PM    Prescriptions: GABAPENTIN 300 MG CAPS (GABAPENTIN) Take 1 cap  q 8 hrs as needed  #30 x 0   Entered by:   Kandice Hams   Authorized by:   Marga Melnick MD   Signed by:   Kandice Hams on 06/06/2009   Method used:   Faxed to ...       [facility] (retail)       [address]       [place], [place]  [address]       Ph: [phone]       Fax: [phone]   RxID:   [id number]

## 2010-06-16 NOTE — Letter (Signed)
Summary: [facility] Form/[facility]  [facility] Options Form/[place]   Imported By: Lanelle Bal 05/19/2009 08:23:43  _____________________________________________________________________  External Attachment:    Type:   Image     Comment:   External Document

## 2010-06-16 NOTE — Consult Note (Signed)
Summary: [facility]   Imported By: Lanelle Bal 05/29/2009 09:38:05  _____________________________________________________________________  External Attachment:    Type:   Image     Comment:   External Document

## 2010-06-16 NOTE — Progress Notes (Signed)
Summary: REFILL  Phone Note Refill Request Message from:  Pharmacy on July 07, 2009 2:56 PM  Refills Requested: Medication #1:  LEVOTHYROXINE SODIUM 25 MCG TABS 1 by mouth qd [facility] [place] FAX [phone]   Method Requested: Fax to Local Pharmacy Next Appointment Scheduled: NO APPT Initial call taken by: Barb Merino,  July 07, 2009 2:57 PM    Prescriptions: LEVOTHYROXINE SODIUM 25 MCG TABS (LEVOTHYROXINE SODIUM) 1 by mouth qd  #90 Tablet x 0   Entered by:   Shonna Chock   Authorized by:   Marga Melnick MD   Signed by:   Shonna Chock on 07/07/2009   Method used:   Electronically to        [facility] (retail)       [address]       [place], [place]  [address]       Ph: [phone]       Fax: [phone]   RxID:   [id number]  [id number]

## 2010-06-16 NOTE — Assessment & Plan Note (Signed)
Summary: COLD/POSSIBLE SINUS INFECTION/KB   Vital Signs:  Patient profile:   77 year old male Weight:      185.0 pounds BMI:     27.42 Temp:     97.9 degrees F oral Pulse rate:   72 / minute Resp:     16 per minute BP sitting:   146 / 80  (left arm) Cuff size:   large  Vitals Entered By: Shonna Chock CMA (March 05, 2010 1:37 PM) CC: Sinus Infection (?), URI symptoms   Primary Care Provider:  Burke Keels, MD  CC:  Sinus Infection (?) and URI symptoms.  History of Present Illness: RTI  Symptoms      This is an 77 year old man who presents with RTI  symptoms since 10/18, onset  as hoarseness.  The patient reports nasal congestion, purulent nasal discharge, and productive cough, but denies sore throat and earache.  The patient denies fever.  The patient denies headache.  The patient denies the following risk factors for Strep sinusitis: unilateral facial pain, tooth pain, Strep exposure, and tender adenopathy.  Rx: OTC allergy pill, Mucinex . FBS < 137 despite RTI symptoms.  Current Medications (verified): 1)  Levothyroxine Sodium 25 Mcg Tabs (Levothyroxine Sodium) .Marland Kitchen.. 1 By Mouth Qd 2)  Benazepril Hcl 40 Mg  Tabs (Benazepril Hcl) .... Once Daily 3)  Clonidine Hcl 0.1 Mg  Tabs (Clonidine Hcl) .Marland Kitchen.. 1 Am, 2 Evening 4)  Metformin Hcl 500 Mg Tabs (Metformin Hcl) .... Take One Tablet Two Times A Day With Largest Meals 5)  Accu-Chek Active   Strp (Glucose Blood) .... Use One Strip Daily To Test For Sugar Level 250.00 6)  Accu-Chek Soft Touch Lancets   Misc (Lancets) .... Use Daily To Check Sugar Level 7)  Glimepiride 2 Mg  Tabs (Glimepiride) .... 1/2 Q Am 8)  Bayer Aspirin 325 Mg Tabs (Aspirin) .Marland Kitchen.. 1 By Mouth Once Daily 9)  Vitamin D (Ergocalciferol) 1000 Unit Caps (Ergocalciferol) .... 2 Pills Once Daily Except 3 On T,th & Sat 10)  Acetaminophen 650 Mg  Tbcr (Acetaminophen) .... As Needed 11)  Pravastatin Sodium 20 Mg Tabs (Pravastatin Sodium) .Marland Kitchen.. 1 At Bedtime 12)  Carvedilol 3.125  Mg Tabs (Carvedilol) .... Take One Tablet By Mouth Every Evening  Allergies: 1)  ! Folic Acid 2)  ! Cardene 3)  ! Lipitor (Atorvastatin Calcium)  Review of Systems Allergy:  Complains of sneezing; denies itching eyes.  Physical Exam  General:  in no acute distress; alert,appropriate and cooperative throughout examination Ears:  External ear exam shows no significant lesions or deformities.  Otoscopic examination reveals clear canals, tympanic membranes are intact bilaterally without bulging, retraction, inflammation or discharge. Hearing is grossly normal bilaterally. Nose:  External nasal examination shows no deformity or inflammation. Nasal mucosa are pink and moist without lesions or exudates.Slight dilocation of septum Mouth:  Oral mucosa and oropharynx without lesions or exudates.  Hoarseness Lungs:  Normal respiratory effort, chest expands symmetrically. Lungs are clear to auscultation, no crackles or wheezes ,but dry cough. Cervical Nodes:  No lymphadenopathy noted Axillary Nodes:  No palpable lymphadenopathy   Impression & Recommendations:  Problem # 1:  BRONCHITIS-ACUTE (ICD-466.0)  His updated medication list for this problem includes:    Amoxicillin 500 Mg Caps (Amoxicillin) .Marland Kitchen... 1 three times a day    Hydromet 5-1.5 Mg/55ml Syrp (Hydrocodone-homatropine) .Marland Kitchen... 1 tsp every 6 hrs as needed for cough  Orders: Prescription Created Electronically [id number])  Problem # 2:  URI (ICD-465.9)  His updated medication list for this problem includes:    Bayer Aspirin 325 Mg Tabs (Aspirin) .Marland Kitchen... 1 by mouth once daily    Acetaminophen 650 Mg Tbcr (Acetaminophen) .Marland Kitchen... As needed    Hydromet 5-1.5 Mg/36ml Syrp (Hydrocodone-homatropine) .Marland Kitchen... 1 tsp every 6 hrs as needed for cough  Orders: Prescription Created Electronically [id number] [phone])  Complete Medication List: 1)  Levothyroxine Sodium 25 Mcg Tabs (Levothyroxine sodium) .Marland Kitchen.. 1 by mouth qd 2)  Benazepril Hcl 40 Mg Tabs (Benazepril hcl)  .... Once daily 3)  Clonidine Hcl 0.1 Mg Tabs (Clonidine hcl) .Marland Kitchen.. 1 am, 2 evening 4)  Metformin Hcl 500 Mg Tabs (Metformin hcl) .... Take one tablet two times a day with largest meals 5)  Accu-chek Active Strp (Glucose blood) .... Use one strip daily to test for sugar level 250.00 6)  Accu-chek Soft Touch Lancets Misc (Lancets) .... Use daily to check sugar level 7)  Glimepiride 2 Mg Tabs (Glimepiride) .... 1/2 q am 8)  Bayer Aspirin 325 Mg Tabs (Aspirin) .Marland Kitchen.. 1 by mouth once daily 9)  Vitamin D (ergocalciferol) 1000 Unit Caps (ergocalciferol)  .... 2 pills once daily except 3 on t,th & sat 10)  Acetaminophen 650 Mg Tbcr (Acetaminophen) .... As needed 11)  Pravastatin Sodium 20 Mg Tabs (Pravastatin sodium) .Marland Kitchen.. 1 at bedtime 12)  Carvedilol 3.125 Mg Tabs (Carvedilol) .... Take one tablet by mouth every evening 13)  Amoxicillin 500 Mg Caps (Amoxicillin) .Marland Kitchen.. 1 three times a day 14)  Fluticasone Propionate 50 Mcg/act Susp (Fluticasone propionate) .Marland Kitchen.. 1 spray two times a day after nasal rinse 15)  Hydromet 5-1.5 Mg/65ml Syrp (Hydrocodone-homatropine) .Marland Kitchen.. 1 tsp every 6 hrs as needed for cough  Patient Instructions: 1)  Use nasal spray two times a day after Neti rinse. 2)  Drink as much NON dairy  fluid as you can tolerate for the next few days. Prescriptions: HYDROMET 5-1.5 MG/5ML SYRP (HYDROCODONE-HOMATROPINE) 1 tsp every 6 hrs as needed for cough  #120cc x 0   Entered and Authorized by:   Marga Melnick MD   Signed by:   Marga Melnick MD on 03/05/2010   Method used:   Printed then faxed to ...       [facility]  [address] [address] [id number]       [address]       [place], [place]  [address]       Ph: [phone] or [phone]       Fax: [phone]   RxID:   [id number] FLUTICASONE PROPIONATE 50 MCG/ACT SUSP (FLUTICASONE PROPIONATE) 1 spray two times a day after nasal rinse  #1 x 5   Entered and Authorized by:   Marga Melnick MD   Signed by:   Marga Melnick MD  on 03/05/2010   Method used:   Printed then faxed to ...       [facility]  [address] [address] [id number]       [address]       [place], [place]  [address]       Ph: [phone] or [phone]       Fax: [phone]   RxID:   [id number] AMOXICILLIN 500 MG CAPS (AMOXICILLIN) 1 three times a day  #30 x 0   Entered and Authorized by:   Marga Melnick MD   Signed by:   Marga Melnick MD on 03/05/2010  Method used:   Printed then faxed to ...       [facility]  [address] [id number]       [address]       [place], [place]  [address]       Ph: [phone] or [phone]       Fax: [phone]   RxID:   [id number]    Orders Added: 1)  Est. Patient Level III [[address]] 2)  Prescription Created Electronically [phone]

## 2010-06-16 NOTE — Assessment & Plan Note (Signed)
Summary: 3 MONTH FOLLOWUP///SPH   Vital Signs:  Patient profile:   77 year old male Weight:      184.8 pounds BMI:     27.39 Pulse rate:   72 / minute Resp:     16 per minute BP sitting:   138 / 86  (left arm) Cuff size:   regular  Vitals Entered By: Shonna Chock CMA (April 23, 2010 10:12 AM) CC: 3 month folllow-up on labs (copy given) , Type 2 diabetes mellitus follow-up   Primary Care Keturah Yerby:  Burke Keels, MD  CC:  3 month folllow-up on labs (copy given)  and Type 2 diabetes mellitus follow-up.  History of Present Illness: Type 2 Diabetes Mellitus Follow-Up      This is an 77 year old man who presents for Type 2 diabetes mellitus follow-up.  The patient reports polyuria, but denies polydipsia, blurred vision, self managed hypoglycemia, weight loss, weight gain, and numbness of extremities.  Other symptoms include  occasional orthostatic symptoms.  The patient denies the following symptoms: neuropathic pain, chest pain, vomiting, poor wound healing, intermittent claudication, vision loss, and foot ulcer.  Since the last visit the patient reports good dietary compliance (" ice cream is gone")and not exercising regularly ( golf weekly).  The patient has been measuring capillary blood glucose before breakfast,130 -150.    Current Medications (verified): 1)  Levothyroxine Sodium 25 Mcg Tabs (Levothyroxine Sodium) .Marland Kitchen.. 1 By Mouth Qd 2)  Benazepril Hcl 40 Mg  Tabs (Benazepril Hcl) .... Once Daily 3)  Clonidine Hcl 0.1 Mg  Tabs (Clonidine Hcl) .Marland Kitchen.. 1 Am, 2 Evening 4)  Metformin Hcl 500 Mg Tabs (Metformin Hcl) .... Take One Tablet Two Times A Day With Largest Meals 5)  Accu-Chek Active   Strp (Glucose Blood) .... Use One Strip Daily To Test For Sugar Level 250.00 6)  Accu-Chek Soft Touch Lancets   Misc (Lancets) .... Use Daily To Check Sugar Level 7)  Glimepiride 2 Mg  Tabs (Glimepiride) .... 1/2 Q Am 8)  Bayer Aspirin 325 Mg Tabs (Aspirin) .Marland Kitchen.. 1 By Mouth Once Daily 9)  Vitamin D  (Ergocalciferol) 1000 Unit Caps (Ergocalciferol) .... 2 Pills Once Daily Except 3 On T,th & Sat 10)  Acetaminophen 650 Mg  Tbcr (Acetaminophen) .... As Needed 11)  Pravastatin Sodium 20 Mg Tabs (Pravastatin Sodium) .Marland Kitchen.. 1 At Bedtime 12)  Carvedilol 3.125 Mg Tabs (Carvedilol) .... Take One Tablet By Mouth Every Evening  Allergies: 1)  ! Folic Acid 2)  ! Cardene 3)  ! Lipitor (Atorvastatin Calcium)  Physical Exam  General:  well-nourished ,appears younger than age;alert,appropriate and cooperative throughout examination Heart:  normal rate, regular rhythm, no gallop, no rub, no JVD, and grade 1 /6 systolic murmur.   Pulses:  R and L carotid,radial  and posterior tibial pulses are full and equal bilaterally. decreased DPP w/o ischemic changes. Good nail health Extremities:  No clubbing, cyanosis, edema. Neurologic:  alert & oriented X3 and sensation intact to light touch over feet.   Skin:  Intact without suspicious lesions or rashes   Impression & Recommendations:  Problem # 1:  DIABETES MELLITUS, TYPE II, CONTROLLED (ICD-250.00)  His updated medication list for this problem includes:    Benazepril Hcl 40 Mg Tabs (Benazepril hcl) ..... Once daily    Metformin Hcl 500 Mg Tabs (Metformin hcl) .Marland Kitchen... Take one tablet two times a day with largest meals    Glimepiride 2 Mg Tabs (Glimepiride) .Marland Kitchen... 1/2 q am  Bayer Aspirin 325 Mg Tabs (Aspirin) .Marland Kitchen... 1 by mouth once daily  Complete Medication List: 1)  Levothyroxine Sodium 25 Mcg Tabs (Levothyroxine sodium) .Marland Kitchen.. 1 by mouth qd 2)  Benazepril Hcl 40 Mg Tabs (Benazepril hcl) .... Once daily 3)  Clonidine Hcl 0.1 Mg Tabs (Clonidine hcl) .Marland Kitchen.. 1 am, 2 evening 4)  Metformin Hcl 500 Mg Tabs (Metformin hcl) .... Take one tablet two times a day with largest meals 5)  Accu-chek Active Strp (Glucose blood) .... Use one strip daily to test for sugar level 250.00 6)  Accu-chek Soft Touch Lancets Misc (Lancets) .... Use daily to check sugar level 7)   Glimepiride 2 Mg Tabs (Glimepiride) .... 1/2 q am 8)  Bayer Aspirin 325 Mg Tabs (Aspirin) .Marland Kitchen.. 1 by mouth once daily 9)  Vitamin D (ergocalciferol) 1000 Unit Caps (ergocalciferol)  .... 2 pills once daily except 3 on t,th & sat 10)  Acetaminophen 650 Mg Tbcr (Acetaminophen) .... As needed 11)  Pravastatin Sodium 20 Mg Tabs (Pravastatin sodium) .Marland Kitchen.. 1 at bedtime 12)  Carvedilol 3.125 Mg Tabs (Carvedilol) .... Take one tablet by mouth every evening  Patient Instructions: 1)  Please schedule a follow-up appointment in 6 months. 2)  Check your blood sugars regularly. If your readings are usually above : 150 or below 90 you should contact our office. 3)  See your eye doctor yearly to check for diabetic eye damage. 4)  Check your feet each night for sore areas, calluses or signs of infection. 5)  BUN,creat,K+prior to visit, ICD-9:401.9 6)  Hepatic Panel prior to visit, ICD-9:995.20 7)  Lipid Panel prior to visit, ICD-9:272.4 8)  HbgA1C prior to visit, ICD-9:250.00 9)  Urine Microalbumin prior to visit, ICD-9:250.00   Orders Added: 1)  Est. Patient Level III [[address]]

## 2010-06-16 NOTE — Assessment & Plan Note (Signed)
Summary: 3 month rov/sl      Allergies Added:   Visit Type:  Follow-up Primary Provider:  Burke Keels, MD  CC:  CAD.  History of Present Illness: The patient is seen for followup for coronary artery disease.  He is doing well.  He was able to travel to [place] again.  He walked a lot and had no significant problems.  His dizziness is mild and chronic.  He does have some claudication but this is also chronic and stable.  He's not having any chest pain.  Current Medications (verified): 1)  Levothyroxine Sodium 25 Mcg Tabs (Levothyroxine Sodium) .Marland Kitchen.. 1 By Mouth Qd 2)  Benazepril Hcl 40 Mg  Tabs (Benazepril Hcl) .... Once Daily 3)  Clonidine Hcl 0.1 Mg  Tabs (Clonidine Hcl) .Marland Kitchen.. 1 Am, 2 Evening 4)  Metformin Hcl 500 Mg Tabs (Metformin Hcl) .... Take One Tablet Two Times A Day With Largest Meals 5)  Accu-Chek Active   Strp (Glucose Blood) .... Use One Strip Daily To Test For Sugar Level 250.00 6)  Accu-Chek Soft Touch Lancets   Misc (Lancets) .... Use Daily To Check Sugar Level 7)  Glimepiride 2 Mg  Tabs (Glimepiride) .... 1/2 Q Am 8)  Bayer Aspirin 325 Mg Tabs (Aspirin) .Marland Kitchen.. 1 By Mouth Once Daily 9)  Vitamin D (Ergocalciferol) 1000 Unit Caps (Ergocalciferol) .... 2 Pills Once Daily Except 3 On T,th & Sat 10)  Acetaminophen 650 Mg  Tbcr (Acetaminophen) .... As Needed 11)  Pravastatin Sodium 20 Mg Tabs (Pravastatin Sodium) .Marland Kitchen.. 1 At Bedtime 12)  Carvedilol 3.125 Mg Tabs (Carvedilol) .... Take One Tablet By Mouth Every Evening  Allergies (verified): 1)  ! Folic Acid 2)  ! Cardene 3)  ! Lipitor (Atorvastatin Calcium)  Past History:  Past Medical History: CAD; S/P Bare metal stent SVG  Cx  2008 CABG Diabetes mellitus, type II Hyperlipidemia Hypertension Hypothyroidism Bradycardia...sinus.. RBBB.Marland Kitchen new November 28, 2008 Leg and back cramps... mostly nighttime Shingles manifested by  severe pain in the right flank & subjective  swelling Dizziness  ... mild   August, 2011  Review of  Systems       Patient denies fever, chills, headache, sweats, rash, change in vision, change in hearing, chest pain, cough, nausea vomiting, urinary symptoms.  All other systems are reviewed and are negative.  Vital Signs:  Patient profile:   77 year old male Height:      69 inches Weight:      185 pounds BMI:     27.42 Pulse rate:   85 / minute BP sitting:   120 / 76  (left arm) Cuff size:   regular  Vitals Entered By: Hardin Negus, RMA (April 02, 2010 10:10 AM)  Physical Exam  General:  patient looks good today. Eyes:  no xanthelasma. Neck:  no jugular venous distention. Lungs:  lungs are clear.  Respiratory effort is nonlabored. Heart:  cardiac exam reveals S1-S2.  No clicks or significant murmurs. Abdomen:  abdomen is soft. Msk:  no musculoskeletal deformities. Extremities:  no peripheral edema. Psych:  patient is oriented to person time and place.  Affect is normal.   Impression & Recommendations:  Problem # 1:  * LEG AND BACK CRAMPS This is stable.  No further workup.  Problem # 2:  SHORTNESS OF BREATH (ICD-786.05)  His updated medication list for this problem includes:    Benazepril Hcl 40 Mg Tabs (Benazepril hcl) ..... Once daily    Bayer Aspirin 325 Mg Tabs (Aspirin) .Marland KitchenMarland KitchenMarland KitchenMarland Kitchen  1 by mouth once daily    Carvedilol 3.125 Mg Tabs (Carvedilol) .Marland Kitchen... Take one tablet by mouth every evening Is not having any shortness of breath.  No further workup.  Problem # 3:  DIZZINESS (ICD-780.4) His dizziness is stable.  No further workup.  Problem # 4:  CORONARY ARTERY DISEASE (ICD-414.00)  His updated medication list for this problem includes:    Benazepril Hcl 40 Mg Tabs (Benazepril hcl) ..... Once daily    Bayer Aspirin 325 Mg Tabs (Aspirin) .Marland Kitchen... 1 by mouth once daily    Carvedilol 3.125 Mg Tabs (Carvedilol) .Marland Kitchen... Take one tablet by mouth every evening Coronary disease is stable.  No change in therapy.  I will be reviewing the records to find when his left ventricular  function was assessed last.  I will then decide about the timing of his next echo.  Patient Instructions: 1)  Follow up in 3 months

## 2010-06-16 NOTE — Assessment & Plan Note (Signed)
Summary: f/u in Aug  Medications Added GLIMEPIRIDE 2 MG  TABS (GLIMEPIRIDE) 1/2 q am CARVEDILOL 3.125 MG TABS (CARVEDILOL) Take one tablet by mouth every evening        Visit Type:  Follow-up Primary Provider:  Burke Keels, MD  CC:  CAD.  History of Present Illness: The patient returns today for followup of coronary disease.  He's feeling well.  He is getting ready to make his 15 trip to [place].  He denied any chest pain or shortness of breath.  He has some mild dizziness.  This can occur randomly for a few seconds possibly a minute.  He does not have syncope.  This does not necessarily occur when he is standing up.  There is no room spinning.  Current Medications (verified): 1)  Levothyroxine Sodium 25 Mcg Tabs (Levothyroxine Sodium) .Marland Kitchen.. 1 By Mouth Qd 2)  Benazepril Hcl 40 Mg  Tabs (Benazepril Hcl) .... Once Daily 3)  Clonidine Hcl 0.1 Mg  Tabs (Clonidine Hcl) .Marland Kitchen.. 1 Am, 2 Evening 4)  Metformin Hcl 500 Mg Tabs (Metformin Hcl) .... Take One Tablet Two Times A Day With Largest Meals 5)  Accu-Chek Active   Strp (Glucose Blood) .... Use One Strip Daily To Test For Sugar Level 250.00 6)  Accu-Chek Soft Touch Lancets   Misc (Lancets) .... Use Daily To Check Sugar Level 7)  Glimepiride 2 Mg  Tabs (Glimepiride) .... 1/2 Q Am 8)  Bayer Aspirin 325 Mg Tabs (Aspirin) .Marland Kitchen.. 1 By Mouth Once Daily 9)  Vitamin D (Ergocalciferol) 1000 Unit Caps (Ergocalciferol) .... 2 Pills Once Daily 10)  Acetaminophen 650 Mg  Tbcr (Acetaminophen) .... As Needed 11)  Pravastatin Sodium 20 Mg Tabs (Pravastatin Sodium) .Marland Kitchen.. 1 At Bedtime, **appointment Due** 12)  Carvedilol 3.125 Mg Tabs (Carvedilol) .... Take One Tablet By Mouth Twice A Day  Allergies: 1)  ! Folic Acid 2)  ! Cardene 3)  ! Lipitor (Atorvastatin Calcium)  Past History:  Past Medical History: CAD; S/P Bare metal stent SVG  Cx  2008 CABG Diabetes mellitus, type II Hyperlipidemia Hypertension Hypothyroidism Bradycardia...sinus RBBB.Marland Kitchen new  November 28, 2008 Leg and back cramps... mostly nighttime Shingles manifested by  severe pain in the right flank & subjective  swelling Dizziness  ... mild   August, 2011  Review of Systems       Patient denies fever, chills, headache, sweats, rash, change in vision, change in hearing, chest pain, cough, nausea vomiting, urinary symptoms.  All of the systems are reviewed and are negative.  Vital Signs:  Patient profile:   77 year old male Height:      69 inches Weight:      188 pounds BMI:     27.86 Pulse rate:   52 / minute BP sitting:   142 / 84  (left arm)  Vitals Entered By: Laurance Flatten CMA (December 25, 2009 10:25 AM)  Physical Exam  General:  patient looks great today. Head:  head is atraumatic. Eyes:  no xanthelasma. Neck:  no jugular venous distention.  Chest Wall:  no chest wall tenderness. Lungs:  lungs are clear respiratory effort is nonlabored. Heart:  cardiac exam reveals S1-S2.  No clicks or significant murmurs. Abdomen:  abdomen is soft. Msk:  no musculoskeletal deformities. Extremities:  no peripheral edema. Skin:  no skin rashes. Psych:  patient is oriented to person time and place.  Affect is normal.   Impression & Recommendations:  Problem # 1:  RBBB (ICD-426.4)  His updated medication list  for this problem includes:    Benazepril Hcl 40 Mg Tabs (Benazepril hcl) ..... Once daily    Bayer Aspirin 325 Mg Tabs (Aspirin) .Marland Kitchen... 1 by mouth once daily    Carvedilol 3.125 Mg Tabs (Carvedilol) .Marland Kitchen... Take one tablet by mouth every evening EKG is done today reviewed by me.  He has old right bundle branch block.  He does have sinus bradycardia.  Problem # 2:  SHORTNESS OF BREATH (ICD-786.05)  His updated medication list for this problem includes:    Benazepril Hcl 40 Mg Tabs (Benazepril hcl) ..... Once daily    Bayer Aspirin 325 Mg Tabs (Aspirin) .Marland Kitchen... 1 by mouth once daily    Carvedilol 3.125 Mg Tabs (Carvedilol) .Marland Kitchen... Take one tablet by mouth every evening There  is no shortness of breath at this time.  Problem # 3:  DIZZINESS (ICD-780.4) The patient mentions his dizziness today.  This is a new problem as we have not seen it during recent visits.  I very hesitant to change his medications relative to his hypertension.  He does have sinus bradycardia.  He is on very low-dose beta blockade.  His carvedilol will be cut to one time daily with an evening dose only.  Problem # 4:  BRADYCARDIA (ICD-427.89)  His updated medication list for this problem includes:    Benazepril Hcl 40 Mg Tabs (Benazepril hcl) ..... Once daily    Bayer Aspirin 325 Mg Tabs (Aspirin) .Marland Kitchen... 1 by mouth once daily    Carvedilol 3.125 Mg Tabs (Carvedilol) .Marland Kitchen... Take one tablet by mouth every evening There is mild bradycardia.  He's had this before.  His carvedilol dose will be decreased further.  Orders: EKG w/ Interpretation (93000)  Problem # 5:  HYPERTENSION (ICD-401.9)  His updated medication list for this problem includes:    Benazepril Hcl 40 Mg Tabs (Benazepril hcl) ..... Once daily    Clonidine Hcl 0.1 Mg Tabs (Clonidine hcl) .Marland Kitchen... 1 am, 2 evening    Bayer Aspirin 325 Mg Tabs (Aspirin) .Marland Kitchen... 1 by mouth once daily    Carvedilol 3.125 Mg Tabs (Carvedilol) .Marland Kitchen... Take one tablet by mouth every evening Blood pressure is under reasonable control.  He is allowed to adjust his clonidine dose little bit during the day.  Problem # 6:  CORONARY ARTERY DISEASE (ICD-414.00)  His updated medication list for this problem includes:    Benazepril Hcl 40 Mg Tabs (Benazepril hcl) ..... Once daily    Bayer Aspirin 325 Mg Tabs (Aspirin) .Marland Kitchen... 1 by mouth once daily    Carvedilol 3.125 Mg Tabs (Carvedilol) .Marland Kitchen... Take one tablet by mouth every evening Coronary disease is stable.  EKG reveals old right bundle branch block.  No further workup is needed.  I have given him permission to make his trip to [place].  Patient Instructions: 1)  Stop AM dose of Carvedilol, take only 3.125mg  every  PM 2)  Follow up in 3 months

## 2010-06-16 NOTE — Letter (Signed)
Summary: CMN for Diabetic Supplies/[facility]  CMN for Diabetic Supplies/ Apothecary   Imported By: Lennie Odor 02/16/2010 10:42:13  _____________________________________________________________________  External Attachment:    Type:   Image     Comment:   External Document

## 2010-06-16 NOTE — Progress Notes (Signed)
Summary: refill  new pharmacy  Phone Note Refill Request   Refills Requested: Medication #1:  LEVOTHYROXINE SODIUM 25 MCG TABS 1 by mouth qd patient [facility] pharmacy - [facility] [id number]   Method Requested: Fax to Local Pharmacy Initial call taken by: Okey Regal [place],  Oct 01, 2009 10:35 AM    Prescriptions: LEVOTHYROXINE SODIUM 25 MCG TABS (LEVOTHYROXINE SODIUM) 1 by mouth qd  #90 Tablet x 1   Entered by:   Shonna Chock   Authorized by:   Marga Melnick MD   Signed by:   Shonna Chock on 10/01/2009   Method used:   [facility] to        [facility]  [address] [id number]       [address]       [place], [place]  [address]       Ph: [phone] or [phone]       Fax: [phone]   RxID:   [id number]

## 2010-06-18 NOTE — Progress Notes (Signed)
   Phone Note Call from Patient   Reason for Call: Acute Illness Action Taken: Patient advised to call 911 Summary of Call: Mr. [name] daughter [name] called to inform us that her father's BP was extremely high at 220/110.  She said he also stared off into space in the middle of taking his medications and was not responsive.  She rechecked his BP and it remained elevated.  I have advised her to give him a second dose of benezepril (20mg ) which is half of the dose he normally takes in the am.  I have advised that they call EMS to check him.  I am concerned about possible TIA vs pending CVA.  He has been compliant with his medications.   Initial call taken by: Joni Reining NP     Appended Document:  Herbert Seta, could you please follow up on this.  Appended Document:  spoke w/pts wife she states pt is doing better, he took the extra benazepril as directed and rested and his BP returned to normal, he has been watching his salt intake and BP has been doing fine, will cont. to monitor adn let us know if has anymore problems

## 2010-07-08 ENCOUNTER — Encounter: Payer: Self-pay | Admitting: Cardiology

## 2010-07-08 ENCOUNTER — Ambulatory Visit (INDEPENDENT_AMBULATORY_CARE_PROVIDER_SITE_OTHER): Payer: Medicare Other | Admitting: Cardiology

## 2010-07-08 DIAGNOSIS — I10 Essential (primary) hypertension: Secondary | ICD-10-CM

## 2010-07-08 DIAGNOSIS — I2510 Atherosclerotic heart disease of native coronary artery without angina pectoris: Secondary | ICD-10-CM

## 2010-07-14 NOTE — Assessment & Plan Note (Signed)
Summary: rov/sl/kl  Medications Added CLONIDINE HCL 0.1 MG  TABS (CLONIDINE HCL) take one by mouth two times a day AMLODIPINE BESYLATE 2.5 MG TABS (AMLODIPINE BESYLATE) Take one tablet by mouth daily        Visit Type:  Follow-up Primary Provider:  Burke Keels, MD  CC:  CAD.  History of Present Illness: Patient is seen for followup of coronary artery disease.  He has hypertension.  There is bradycardia we've been watching carefully.  He also has significant coronary disease.  He does mention having some chest tightness when walking his dog yesterday.  This has not been a recurrent problem.  He had no rest pain.  Current Medications (verified): 1)  Levothyroxine Sodium 25 Mcg Tabs (Levothyroxine Sodium) .Marland Kitchen.. 1 By Mouth Qd 2)  Benazepril Hcl 40 Mg  Tabs (Benazepril Hcl) .... Once Daily 3)  Clonidine Hcl 0.1 Mg  Tabs (Clonidine Hcl) .Marland Kitchen.. 1 Am, 2 Evening 4)  Metformin Hcl 500 Mg Tabs (Metformin Hcl) .... Take One Tablet Two Times A Day With Largest Meals 5)  Accu-Chek Active   Strp (Glucose Blood) .... Use One Strip Daily To Test For Sugar Level 250.00 6)  Accu-Chek Soft Touch Lancets   Misc (Lancets) .... Use Daily To Check Sugar Level 7)  Glimepiride 2 Mg  Tabs (Glimepiride) .... 1/2 Q Am 8)  Bayer Aspirin 325 Mg Tabs (Aspirin) .Marland Kitchen.. 1 By Mouth Once Daily 9)  Vitamin D (Ergocalciferol) 1000 Unit Caps (Ergocalciferol) .... 2 Pills Once Daily Except 3 On T,th & Sat 10)  Acetaminophen 650 Mg  Tbcr (Acetaminophen) .... As Needed 11)  Carvedilol 3.125 Mg Tabs (Carvedilol) .... Take One Tablet By Mouth Every Evening  Allergies: 1)  ! Folic Acid 2)  ! Cardene 3)  ! Lipitor (Atorvastatin Calcium)  Past History:  Past Medical History: CAD; S/P Bare metal stent SVG  Cx  2008.Marland Kitchen CABG Diabetes mellitus, type II Hyperlipidemia Hypertension Hypothyroidism Bradycardia...sinus.. RBBB.Marland Kitchen new November 28, 2008 Leg and back cramps... mostly nighttime Shingles manifested by  severe pain in  the right flank & subjective  swelling Dizziness  ... mild   August, 2011  Review of Systems       Patient denies fever, chills, headache, sweats, rash, change in vision, change in hearing, cough, nausea vomiting, urinary symptoms.  All other systems are reviewed and are negative  Vital Signs:  Patient profile:   77 year old male Height:      69 inches Weight:      186 pounds BMI:     27.57 Pulse rate:   48 / minute BP sitting:   158 / 74  (left arm)  Vitals Entered By: Laurance Flatten CMA (July 08, 2010 10:20 AM)  Physical Exam  General:  patient is stable today. Head:  head is atraumatic. Eyes:  no xanthelasma. Neck:  no jugular venous distention. Chest Wall:  no chest wall tenderness. Lungs:  lungs are clear.  Respiratory effort is not labored. Heart:  cardiac exam reveals S1-S2.  No clicks or significant murmurs. Abdomen:  abdomen is soft. Msk:  no musculoskeletal deformities. Extremities:  no peripheral edema. Skin:  no skin rashes. Psych:  patient is oriented to person time and place.  Affect is normal.   Impression & Recommendations:  Problem # 1:  CORONARY ARTERY DISEASE (ICD-414.00)  The patient has known coronary disease.  He did have some chest tightness with walking yesterday.  It is time for [place] to proceed with a pharmacologic stress  nuclear study.  LV function also needs to be reassessed with an echo.  These studies will be done and all seem for early followup.  Orders: Nuclear Stress Test (Nuc Stress Test) Echocardiogram (Echo)  Problem # 2:  RBBB (ICD-426.4) EKG is done today and reviewed by me.  There is sinus bradycardia.  There is no significant change from prior tracing.  There is old right bundle branch block with left axis deviation.  Problem # 3:  BRADYCARDIA (ICD-427.89) I am concerned about the patient's bradycardia.  There's been no syncope or presyncope.  However he may feel better with a higher heart rate.  He is on very low-dose beta  blocker.  Hesitant to stop this because of his chest discomfort.  He also uses clonidine.  He would be optimal to stop clonidine.  I will cut back the dose of his clonidine.  I discussed with him the fact that he may need a pacemaker.  I may have to try to walk in on a standard treadmill to see his heart rate response.  Problem # 4:  HYPERTENSION (ICD-401.9) We continue to adjust medications for his blood pressure.  Problem # 5:  I  Patient Instructions: 1)  .Your physician recommends that you schedule a follow-up appointment in: 2 weeks with Dr. Myrtis Ser 2)  Your physician has recommended you make the following change in your medication: Decrease Clonidine to 0.1 mg by mouth two times a day 3)  Start amlodipine 2.5 mg by mouth daily. 4)  Your physician has requested that you have an echocardiogram.  Echocardiography is a painless test that uses sound waves to create images of your heart. It provides your doctor with information about the size and shape of your heart and how well your heart's chambers and valves are working.  This procedure takes approximately one hour. There are no restrictions for this procedure. 5)  Your physician has requested that you have an adenosine myoview.  For further information please visit [email or web address]  Please follow instruction sheet, as given. Prescriptions: AMLODIPINE BESYLATE 2.5 MG TABS (AMLODIPINE BESYLATE) Take one tablet by mouth daily  #30 x 6   Entered by:   Dossie Arbour, RN, BSN   Authorized by:   Talitha Givens, MD, [facility]   Signed by:   Dossie Arbour, RN, BSN on 07/08/2010   Method used:   [facility]ron[facility] to        [facility]  [address] [address] [id number]       [address]       [place], [place]  [address]       Ph: [phone] or [phone]       Fax: [phone]   RxID:   [id number] CLONIDINE HCL 0.1 MG  TABS (CLONIDINE HCL) take one by mouth two times a day  #60 x 6   Entered by:   Dossie Arbour, RN,  BSN   Authorized by:   Talitha Givens, MD, [facility]   Signed by:   Dossie Arbour, RN, BSN on 07/08/2010   Method used:   [facility]  [address] [id number]       [address]       [place], [place]  [address]       Ph: [phone] or [phone]  Fax: [phone]   RxID:   [id number]

## 2010-07-16 ENCOUNTER — Telehealth (INDEPENDENT_AMBULATORY_CARE_PROVIDER_SITE_OTHER): Payer: Self-pay | Admitting: Radiology

## 2010-07-20 ENCOUNTER — Encounter: Payer: Self-pay | Admitting: Cardiovascular Disease

## 2010-07-20 ENCOUNTER — Ambulatory Visit (HOSPITAL_COMMUNITY): Payer: Medicare Other | Attending: Cardiology

## 2010-07-20 ENCOUNTER — Encounter: Payer: Self-pay | Admitting: Internal Medicine

## 2010-07-20 DIAGNOSIS — I10 Essential (primary) hypertension: Secondary | ICD-10-CM | POA: Insufficient documentation

## 2010-07-20 DIAGNOSIS — R42 Dizziness and giddiness: Secondary | ICD-10-CM | POA: Insufficient documentation

## 2010-07-20 DIAGNOSIS — I080 Rheumatic disorders of both mitral and aortic valves: Secondary | ICD-10-CM | POA: Insufficient documentation

## 2010-07-20 DIAGNOSIS — R0789 Other chest pain: Secondary | ICD-10-CM

## 2010-07-20 DIAGNOSIS — E039 Hypothyroidism, unspecified: Secondary | ICD-10-CM | POA: Insufficient documentation

## 2010-07-20 DIAGNOSIS — I2510 Atherosclerotic heart disease of native coronary artery without angina pectoris: Secondary | ICD-10-CM | POA: Insufficient documentation

## 2010-07-20 DIAGNOSIS — E785 Hyperlipidemia, unspecified: Secondary | ICD-10-CM | POA: Insufficient documentation

## 2010-07-20 DIAGNOSIS — I4510 Unspecified right bundle-branch block: Secondary | ICD-10-CM | POA: Insufficient documentation

## 2010-07-20 DIAGNOSIS — R0609 Other forms of dyspnea: Secondary | ICD-10-CM | POA: Insufficient documentation

## 2010-07-20 DIAGNOSIS — D696 Thrombocytopenia, unspecified: Secondary | ICD-10-CM | POA: Insufficient documentation

## 2010-07-20 DIAGNOSIS — E119 Type 2 diabetes mellitus without complications: Secondary | ICD-10-CM | POA: Insufficient documentation

## 2010-07-20 DIAGNOSIS — I498 Other specified cardiac arrhythmias: Secondary | ICD-10-CM | POA: Insufficient documentation

## 2010-07-20 DIAGNOSIS — R0989 Other specified symptoms and signs involving the circulatory and respiratory systems: Secondary | ICD-10-CM | POA: Insufficient documentation

## 2010-07-20 DIAGNOSIS — I25810 Atherosclerosis of coronary artery bypass graft(s) without angina pectoris: Secondary | ICD-10-CM | POA: Insufficient documentation

## 2010-07-23 NOTE — Progress Notes (Signed)
Summary: Nuclear Pre-Procedure  Phone Note Outgoing Call Call back at [facility] Phone [phone]   Call placed by: Stanton Kidney, EMT-P,  July 16, 2010 11:36 AM Action Taken: Phone Call Completed Reason for Call: Confirm/change Appt Summary of Call: Left message with information on Myoview Information Sheet (see scanned document for details). Stanton Kidney, EMT-P  July 16, 2010 11:36 AM      Nuclear Med Background Indications for Stress Test: Evaluation for Ischemia, Graft Patency, Stent Patency   History: CABG, Heart Catheterization, Myocardial Infarction, Myocardial Perfusion Study, Stents  History Comments: '94 Cabg ; '06 MPS -EF=44%; '06/"08 Cath SVG>Diag. occ.; '08 MI NSTEMI /Stent- SVG >CFX.  Symptoms: Chest Tightness with Exertion, Dizziness    Nuclear Pre-Procedure Cardiac Risk Factors: Hypertension, Lipids, NIDDM, RBBB Height (in): 69

## 2010-07-28 NOTE — Assessment & Plan Note (Addendum)
Summary: Cardiology Nuclear Testing  Nuclear Med Background Indications for Stress Test: Evaluation for Ischemia, Graft Patency, Stent Patency   History: CABG, Heart Catheterization, Myocardial Infarction, Myocardial Perfusion Study, Stents  History Comments: '94 CABG; '06 MPS:(+), EF=44%; "08 NSTEMI>Stent-SVG>CFX  Symptoms: Chest Tightness, Chest Tightness with Exertion, Dizziness, Near Syncope  Symptoms Comments: Last episode of XL:KGMWNUUV, 07/18/10.   Nuclear Pre-Procedure Cardiac Risk Factors: Family History - CAD, History of Smoking, Hypertension, Lipids, NIDDM, RBBB Caffeine/Decaff Intake: None NPO After: 8:00 AM Lungs: Clear.  O2 Sat 98% on RA. IV 0.9% NS with Angio Cath: 18g     IV Site: R Antecubital IV Started by: Stanton Kidney, EMT-P Chest Size (in) 42     Height (in): 69 Weight (lb): 182 BMI: 26.97 Tech Comments: Carvedilol held this a.m., per patient.  Nuclear Med Study 1 or 2 day study:  1 day     Stress Test Type:  Eugenie Birks Reading MD:  Dietrich Pates, MD     Referring MD:  Willa Rough, MD Resting Radionuclide:  Technetium 38m Tetrofosmin     Resting Radionuclide Dose:  11 mCi  Stress Radionuclide:  Technetium 35m Tetrofosmin     Stress Radionuclide Dose:  33 mCi   Stress Protocol   Lexiscan: 0.4 mg   Stress Test Technologist:  [employer]a [employer], CMA-N     Nuclear Technologist:  Domenic Polite, CNMT  Rest Procedure  Myocardial perfusion imaging was performed at rest 45 minutes following the intravenous administration of Technetium 61m Tetrofosmin.  Stress Procedure  The patient received IV Lexiscan 0.4 mg over 15-seconds.  Technetium 44m Tetrofosmin injected at 30-seconds.  There were no significant changes with infusion.  He did have a hypertensive response, 221/95.  Quantitative spect images were obtained after a 45 minute delay.  Patient given Carvedilol 3.125 mg after recovery.  QPS Raw Data Images:  Soft tissue (diaphragm) underlies heart. Stress Images:   Defect in the distal anterior, anterolateral and apical walls.  Also defect in the infeior /inferolateral base. Rest Images:  Visually no significant ischemia. Subtraction (SDS):  Borderline for ischemia. Transient Ischemic Dilatation:  1.13  (Normal <1.22)  Lung/Heart Ratio:  0.25  (Normal <0.45)  Quantitative Gated Spect Images QGS EDV:  178 ml QGS ESV:  103 ml QGS EF:  42 % QGS cine images:  Hypokinesis in the distal anterior and apical walls.   Overall Impression  Exercise Capacity: Lexiscan with no exercise. BP Response: Hypertensive blood pressure response. Clinical Symptoms: Chest tightness ECG Impression: No significant ST segment change suggestive of ischemia. Overall Impression Comments: Scar in the distal anterior/anterolateral and apical walls.  Basal inferior changes consistent with soft tissue attenuation and/or scar.  No ischemia.  LVEF calculated at 42%.  Appended Document: Cardiology Nuclear Testing pt aware

## 2010-07-29 ENCOUNTER — Encounter: Payer: Self-pay | Admitting: Cardiology

## 2010-07-30 ENCOUNTER — Ambulatory Visit (INDEPENDENT_AMBULATORY_CARE_PROVIDER_SITE_OTHER): Payer: Medicare Other | Admitting: Cardiology

## 2010-07-30 ENCOUNTER — Encounter: Payer: Self-pay | Admitting: Cardiology

## 2010-07-30 DIAGNOSIS — I10 Essential (primary) hypertension: Secondary | ICD-10-CM

## 2010-07-30 DIAGNOSIS — I2510 Atherosclerotic heart disease of native coronary artery without angina pectoris: Secondary | ICD-10-CM

## 2010-08-04 NOTE — Miscellaneous (Signed)
  Clinical Lists Changes  Observations: Added new observation of PAST MED HX: CAD; S/P Bare metal stent SVG  Cx  2008.. /  nuclear July 20, 2010... scar-distal anterior and anterolateral and apical... no ischemia... EF 42% EF 45%   echo... July 20, 2010 Aortic insufficiency   moderate... echo... March, 2012 Mitral regurgitation   moderate.... echo... March, 2012 Pulmonary hypertension     echo... March, 2012... 42 mmHg CABG Diabetes mellitus, type II Hyperlipidemia Hypertension Hypothyroidism Bradycardia...sinus.. RBBB.Marland Kitchen new November 28, 2008 Leg and back cramps... mostly nighttime Shingles manifested by  severe pain in the right flank & subjective  swelling Dizziness  ... mild   August, 2011 (07/29/2010 10:31) Added new observation of PRIMARY MD: Burke Keels, MD (07/29/2010 10:31)       Past History:  Past Medical History: CAD; S/P Bare metal stent SVG  Cx  2008.. /  nuclear July 20, 2010... scar-distal anterior and anterolateral and apical... no ischemia... EF 42% EF 45%   echo... July 20, 2010 Aortic insufficiency   moderate... echo... March, 2012 Mitral regurgitation   moderate.... echo... March, 2012 Pulmonary hypertension     echo... March, 2012... 42 mmHg CABG Diabetes mellitus, type II Hyperlipidemia Hypertension Hypothyroidism Bradycardia...sinus.. RBBB.Marland Kitchen new November 28, 2008 Leg and back cramps... mostly nighttime Shingles manifested by  severe pain in the right flank & subjective  swelling Dizziness  ... mild   August, 2011

## 2010-08-04 NOTE — Assessment & Plan Note (Signed)
Summary: f2w/per check out/jml  Medications Added AMLODIPINE BESYLATE 5 MG TABS (AMLODIPINE BESYLATE) Take one tablet by mouth daily      Allergies Added:   Visit Type:  Follow-up Primary Provider:  Burke Keels, MD  CC:  CAD.  History of Present Illness: The patient is seen for followup of coronary artery disease.  I also follow his hypertension.  I saw him last July 08, 2010.  At that time I added 2.5 mg of amlodipine for his blood pressure.  I am hopeful that this can be titrated up and his clonidine titrated off.  It was also necessary to proceed with further workup of his left ventricular function and to rule out ischemia.  Two-dimensional echo was done and there was a good result.  His ejection fraction is 45%.  His nuclear scan reveals old scar but no significant ischemia.  He feels well today.  Problems Prior to Update: 1)  Diabetes Mellitus, Type II, Controlled  (ICD-250.00) 2)  Chest Pain  (ICD-786.50) 3)  Postherpetic Neuralgia  (ICD-053.19) 4)  Vitamin D Deficiency  (ICD-268.9) 5)  Shingles  (ICD-053.9) 6)  Unspecified Myalgia and Myositis  (ICD-729.1) 7)  Arthralgia  (ICD-719.40) 8)  Rbbb  (ICD-426.4) 9)  Leg and Back Cramps  () 10)  Shortness of Breath  (ICD-786.05) 11)  Bradycardia  (ICD-427.89) 12)  Dizziness  (ICD-780.4) 13)  Hypertension  (ICD-401.9) 14)  Coronary Artery Bypass Graft, Hx of  (ICD-V45.81) 15)  Thrombocytopenia  (ICD-287.5) 16)  Adverse Drug Reaction  (ICD-995.20) 17)  Hypertension  (ICD-401.9) 18)  Hyperlipidemia  (ICD-272.4) 19)  Diabetes Mellitus, Type II  (ICD-250.00) 20)  Coronary Artery Disease  (ICD-414.00) 21)  Glaucoma, Borderline  (ICD-365.00) 22)  Orthostatic Dizziness  (ICD-780.4) 23)  Hypothyroidism  (ICD-244.9) 24)  Family History Diabetes 1st Degree Relative  (ICD-V18.0)  Current Medications (verified): 1)  Levothyroxine Sodium 25 Mcg Tabs (Levothyroxine Sodium) .Marland Kitchen.. 1 By Mouth Qd 2)  Benazepril Hcl 40 Mg  Tabs  (Benazepril Hcl) .... Once Daily 3)  Clonidine Hcl 0.1 Mg  Tabs (Clonidine Hcl) .... Take One By Mouth Two Times A Day 4)  Metformin Hcl 500 Mg Tabs (Metformin Hcl) .... Take One Tablet Two Times A Day With Largest Meals 5)  Accu-Chek Active   Strp (Glucose Blood) .... Use One Strip Daily To Test For Sugar Level 250.00 6)  Accu-Chek Soft Touch Lancets   Misc (Lancets) .... Use Daily To Check Sugar Level 7)  Glimepiride 2 Mg  Tabs (Glimepiride) .... 1/2 Q Am 8)  Bayer Aspirin 325 Mg Tabs (Aspirin) .Marland Kitchen.. 1 By Mouth Once Daily 9)  Vitamin D (Ergocalciferol) 1000 Unit Caps (Ergocalciferol) .... 2 Pills Once Daily Except 3 On T,th & Sat 10)  Acetaminophen 650 Mg  Tbcr (Acetaminophen) .... As Needed 11)  Carvedilol 3.125 Mg Tabs (Carvedilol) .... Take One Tablet By Mouth Every Evening 12)  Amlodipine Besylate 2.5 Mg Tabs (Amlodipine Besylate) .... Take One Tablet By Mouth Daily  Allergies (verified): 1)  ! Folic Acid 2)  ! Cardene 3)  ! Lipitor (Atorvastatin Calcium)  Past History:  Past Medical History: CAD; S/P Bare metal stent SVG  Cx  2008.. /  nuclear July 20, 2010... scar-distal anterior and anterolateral and apical... no ischemia... EF 42% EF 45%   echo... July 20, 2010 Aortic insufficiency   moderate... echo... March, 2012 Mitral regurgitation   moderate.... echo... March, 2012 Pulmonary hypertension     echo... March, 2012... 42 mmHg CABG Diabetes mellitus, type  II Hyperlipidemia Hypertension'' Hypothyroidism Bradycardia...sinus.. RBBB.Marland Kitchen new November 28, 2008 Leg and back cramps... mostly nighttime Shingles manifested by  severe pain in the right flank & subjective  swelling Dizziness  ... mild   August, 2011  Review of Systems       Patient denies fever, chills, headache, sweats, rash, change in vision, change in hearing, chest pain, cough, nausea vomiting, urinary symptoms.  All other systems are reviewed and are negative.  Vital Signs:  Patient profile:   77 year old  male Height:      69 inches Weight:      182.50 pounds BMI:     27.05 Pulse rate:   88 / minute Resp:     18 per minute BP sitting:   180 / 92  (left arm) Cuff size:   regular  Vitals Entered By: Celestia Khat, CMA (July 30, 2010 9:38 AM)  Physical Exam  General:  patient is stable today. Eyes:  no xanthelasma. Neck:  no jugular venous distention. Lungs:  lungs are clear.  Respiratory effort is nonlabored. Heart:  cardiac exam reveals an S1-S2.  There are no clicks or significant murmurs Abdomen:  abdomen is soft. Extremities:  no peripheral edema. Psych:  patient is oriented to person time and place.  Affect is normal.  He is here with his wife as always.   Impression & Recommendations:  Problem # 1:  SHORTNESS OF BREATH (ICD-786.05) catheters no shortness of breath.  He is doing well.  Problem # 2:  HYPERTENSION (ICD-401.9) Is tolerating low-dose amlodipine.  The dose will be increased to 5 mg.  We will then be in touch with him by telephone over time.  After one week I will see if he needs 10 mg daily.  Problem # 3:  CORONARY ARTERY DISEASE (ICD-414.00) Coronary disease is stable.  His nuclear scan reveals no ischemia.  Ejection fraction by echo was 45%.  No further workup needed.  Patient Instructions: 1)  Increase Amlodipine to 5mg  daily 2)  Call me on Mon. with update on your blood pressure 3)  Follow up in 4 weeks Prescriptions: AMLODIPINE BESYLATE 5 MG TABS (AMLODIPINE BESYLATE) Take one tablet by mouth daily  #30 x 6   Entered by:   Meredith Staggers, RN   Authorized by:   Talitha Givens, MD, [facility]   Signed by:   Meredith Staggers, RN on 07/30/2010   Method used:   [facility]  [address] [id number]       [address]       [place], [place]  [address]       Ph: [phone] or [phone]       Fax: [phone]   RxID:   [id number]

## 2010-08-07 ENCOUNTER — Telehealth: Payer: Self-pay | Admitting: Cardiology

## 2010-08-07 NOTE — Telephone Encounter (Signed)
3/15 amlodipine increased to 5mg  daily, pt was instructed to call back today w/update on BP, have called pt and Left message to call back

## 2010-08-07 NOTE — Telephone Encounter (Signed)
Keep at amlodipine 5 mg daily. Call in more blood pressures next week

## 2010-08-07 NOTE — Telephone Encounter (Signed)
Spoke w/pts wife BP 150-162/80s, before he takes meds in AM, in afternoon running about the same, never lower than 150 systolic, will discuss w/Dr Myrtis Ser and call her back

## 2010-08-07 NOTE — Telephone Encounter (Signed)
pts wife is aware and she will c/b next week w/readings

## 2010-08-07 NOTE — Telephone Encounter (Signed)
Was told by dr. Myrtis Ser  to called this am - regarding pt b/p today 158/70.

## 2010-08-31 ENCOUNTER — Other Ambulatory Visit: Payer: Self-pay | Admitting: Internal Medicine

## 2010-08-31 NOTE — Telephone Encounter (Signed)
a1c 250.00  

## 2010-09-04 ENCOUNTER — Encounter: Payer: Self-pay | Admitting: Cardiology

## 2010-09-04 DIAGNOSIS — E039 Hypothyroidism, unspecified: Secondary | ICD-10-CM | POA: Insufficient documentation

## 2010-09-04 DIAGNOSIS — R42 Dizziness and giddiness: Secondary | ICD-10-CM | POA: Insufficient documentation

## 2010-09-04 DIAGNOSIS — R252 Cramp and spasm: Secondary | ICD-10-CM | POA: Insufficient documentation

## 2010-09-04 DIAGNOSIS — B029 Zoster without complications: Secondary | ICD-10-CM | POA: Insufficient documentation

## 2010-09-04 DIAGNOSIS — I351 Nonrheumatic aortic (valve) insufficiency: Secondary | ICD-10-CM | POA: Insufficient documentation

## 2010-09-04 DIAGNOSIS — E785 Hyperlipidemia, unspecified: Secondary | ICD-10-CM | POA: Insufficient documentation

## 2010-09-04 DIAGNOSIS — I272 Pulmonary hypertension, unspecified: Secondary | ICD-10-CM | POA: Insufficient documentation

## 2010-09-04 DIAGNOSIS — I10 Essential (primary) hypertension: Secondary | ICD-10-CM | POA: Insufficient documentation

## 2010-09-04 DIAGNOSIS — E119 Type 2 diabetes mellitus without complications: Secondary | ICD-10-CM | POA: Insufficient documentation

## 2010-09-04 DIAGNOSIS — R001 Bradycardia, unspecified: Secondary | ICD-10-CM | POA: Insufficient documentation

## 2010-09-04 DIAGNOSIS — I452 Bifascicular block: Secondary | ICD-10-CM | POA: Insufficient documentation

## 2010-09-04 DIAGNOSIS — I340 Nonrheumatic mitral (valve) insufficiency: Secondary | ICD-10-CM | POA: Insufficient documentation

## 2010-09-04 DIAGNOSIS — Z951 Presence of aortocoronary bypass graft: Secondary | ICD-10-CM | POA: Insufficient documentation

## 2010-09-05 ENCOUNTER — Encounter: Payer: Self-pay | Admitting: Cardiology

## 2010-09-07 ENCOUNTER — Ambulatory Visit (INDEPENDENT_AMBULATORY_CARE_PROVIDER_SITE_OTHER): Payer: Medicare Other | Admitting: Cardiology

## 2010-09-07 ENCOUNTER — Encounter: Payer: Self-pay | Admitting: Cardiology

## 2010-09-07 DIAGNOSIS — I498 Other specified cardiac arrhythmias: Secondary | ICD-10-CM

## 2010-09-07 DIAGNOSIS — I10 Essential (primary) hypertension: Secondary | ICD-10-CM

## 2010-09-07 DIAGNOSIS — R001 Bradycardia, unspecified: Secondary | ICD-10-CM

## 2010-09-07 DIAGNOSIS — I2510 Atherosclerotic heart disease of native coronary artery without angina pectoris: Secondary | ICD-10-CM

## 2010-09-07 NOTE — Assessment & Plan Note (Signed)
Coronary disease is stable. No change in therapy. 

## 2010-09-07 NOTE — Assessment & Plan Note (Signed)
Patient continues to have resting bradycardia.  He is not having any problems with this.  No change in therapy.

## 2010-09-07 NOTE — Assessment & Plan Note (Signed)
Hold blood pressure measurements reveal that his blood pressure is under reasonable control for him at this time.  It has been very difficult to fully noted his blood pressure.  I feel it most prudent that we not change his medicine at this time.

## 2010-09-07 NOTE — Progress Notes (Signed)
HPI The patient is seen for followup of hypertension and coronary disease.  Norvasc has been added to his medications.  He is now up to 5 mg daily.  He is tolerating this and his blood pressure is under reasonable control for him at home.  My goal had been to get him off clonidine.  He is only on a small dose. Allergies  Allergen Reactions  . Atorvastatin     REACTION: myalgias  . Folic Acid     REACTION: Face burns  . Nicardipine Hcl     REACTION: Gingival hypertrophy    Current Outpatient Prescriptions  Medication Sig Dispense Refill  . acetaminophen (TYLENOL) 650 MG CR tablet Take 650 mg by mouth as needed.        Marland Kitchen aspirin 325 MG tablet Take 325 mg by mouth daily.        . benazepril (LOTENSIN) 40 MG tablet Take 40 mg by mouth daily.        . carvedilol (COREG) 3.125 MG tablet Take 3.125 mg by mouth every evening.        . Cholecalciferol (VITAMIN D) 1000 UNITS capsule 2 pills once daily except 3 on T, TH, & Sat       . cloNIDine (CATAPRES) 0.1 MG tablet Take 0.1 mg by mouth 2 (two) times daily.       Marland Kitchen glimepiride (AMARYL) 2 MG tablet Take half a tablet every morning.       Marland Kitchen glucose blood (ACCU-CHEK ACTIVE STRIPS) test strip Use one strip daily to test for sugar level 250.00       . Lancets (ACCU-CHEK SOFT TOUCH) lancets Use daily to check sugar level.       . levothyroxine (SYNTHROID, LEVOTHROID) 25 MCG tablet Take 25 mcg by mouth daily.        . metFORMIN (GLUCOPHAGE) 500 MG tablet TAKE ONE TABLET TWO TIMES A DAY WITH LARGEST MEALS  180 tablet  0  . pravastatin (PRAVACHOL) 20 MG tablet Take 20 mg by mouth daily.        Marland Kitchen DISCONTD: amLODipine (NORVASC) 5 MG tablet Take 5 mg by mouth daily.          History   Social History  . Marital Status: Married    Spouse Name: N/A    Number of Children: N/A  . Years of Education: N/A   Occupational History  . Not on file.   Social History Main Topics  . Smoking status: Former Games developer  . Smokeless tobacco: Not on file  . Alcohol  Use: Not on file  . Drug Use: Not on file  . Sexually Active: Not on file   Other Topics Concern  . Not on file   Social History Narrative  . No narrative on file    Family History  Problem Relation Age of Onset  . Diabetes    . Coronary artery disease Father     Past Medical History  Diagnosis Date  . CAD (coronary artery disease)     Bare-metal stent to SVG circumflex 2008 / nuclear March, 2012, scar distal anterior, anterolateral and apical, no ischemia  . Hypertension   . Ejection fraction     45%, echo, March, 2012  . Aortic insufficiency     Moderate, echo, March, 2012  . Mitral regurgitation     Moderate, echo, March, 2012  . Pulmonary hypertension     Echo, March, 2012, 42 mmHg  . Hx of CABG   . Diabetes mellitus   .  Dyslipidemia   . Hypothyroidism   . Bradycardia     Sinus bradycardia  . RBBB (right bundle branch block with left anterior fascicular block)     New July, 2010  . Leg cramps     Nighttime  . Shingles     Severe pain to right flank treated  . Dizziness     Mild, August, 2011  . Ischemia     .Marland KitchenMarland KitchenEF 42%  . Hyperlipidemia   . History of colonoscopy 2005    Past Surgical History  Procedure Date  . Coronary artery bypass graft 1996    5 vessels  . Back surgery 1999    LS sx- 1999  . Spinal fusion 2000    scar tissue, plate insertion/fusion @ [facility] spine - 2000    ROS  Patient denies fever, chills, headache, sweats, rash, change in vision, change in hearing, chest pain, cough, nausea vomiting, urinary symptoms.  All of the systems are reviewed and are negative.  PHYSICAL EXAM Patient is here with his wife today as always.  He is oriented to person time and place.  Affect is normal.  There is no xanthelasma.  There no carotid bruits.  Lungs are clear.  Respiratory effort is unlabored.  Cardiac exam reveals S1-S2.  No clicks or significant murmurs.  The abdomen is soft.  There is no peripheral edema. Filed Vitals:   09/07/10 1019  BP: 172/92   Pulse: 56  Resp: 18  Height: 5\' 9"  (1.753 m)  Weight: 182 lb 12.8 oz (82.918 kg)    EKG No EKG is done today.  ASSESSMENT & PLAN

## 2010-09-07 NOTE — Patient Instructions (Signed)
Your physician recommends that you schedule a follow-up appointment in: 3  Months with Dr. Myrtis Ser

## 2010-09-14 ENCOUNTER — Other Ambulatory Visit: Payer: Self-pay | Admitting: Cardiology

## 2010-09-29 NOTE — Assessment & Plan Note (Signed)
[facility]                            CARDIOLOGY OFFICE NOTE   NAME:[name], [name]                     MRN:          [id number]  DATE:11/16/2007                            DOB:          11-13-1925    Mr. Behar is actually doing well.  I saw him last in the office in  April 2009.  He is doing very well.  His blood pressure is under good  control.   He is feeling well and looking well.  He is talking about looking  forward to being able to go to visit [place] again in March 2010, and  this will be his fourteenth trip.  He would love to live in the area of  the [place] if he could in [place].   His last cardiac event was in October 2008.  At that time, he received a  bare-metal stent to the vein graft and the circumflex.  He has done very  well.  He is going about full activities.  He is not having chest pain  or shortness of breath.  He has had no syncope or presyncope.   PAST MEDICAL HISTORY:   ALLERGIES:  FOLIC ACID, LIPITOR, and CODEINE.   MEDICATIONS:  Coreg 12.5 b.i.d., levothyroxine, benazepril, metformin,  Plavix, aspirin, Zocor, and clonidine.   OTHER MEDICAL PROBLEMS:  See the list on my note of April 10, 2007.   REVIEW OF SYSTEMS:  He is really doing well and has no significant  complaints.   PHYSICAL EXAMINATION:  Weight is 182 pounds.  Blood pressure is 142/76.  Pulse today is 49.  The patient is oriented to person, time, and place.  Affect is normal.  HEENT reveals no xanthelasma.  He has normal  extraocular motion.  There are no carotid bruits.  There is no jugular  venous distention.  Lungs are clear.  Respiratory effort is not labored.  Cardiac exam reveals an S1 with an S2.  There are no clicks or  significant murmurs.  Abdomen is soft.  He has no peripheral edema.   EKG reveals old diffuse ST-T wave changes with sinus bradycardia.   Problems are listed completely on my note of April 10, 2007.  His  coronary  status is stable.  He does have bradycardia.  He is not  symptomatic.  I have chosen not to change his medicines.  I will see him  back in 6 months.     Luis Abed, MD, [facility]  Electronically Signed    JDK/MedQ  DD: 11/16/2007  DT: 11/17/2007  Job #: [id number]

## 2010-09-29 NOTE — Assessment & Plan Note (Signed)
[facility]                            CARDIOLOGY OFFICE NOTE   NAME:[name]                     MRN:          [id number]  DATE:06/23/2007                            DOB:          1926/04/28    [name] returns today and he is feeling well.  Unfortunately,  his systolic blood pressure is significantly elevated.  His overall  pressure is 220/98.  When I rechecked it, it was 210/95.  He says that  he took his morning medicines approximately 3 hours ago.  We have moved  away from using his morning clonidine dose because he said it made him  feel poorly.  He says that his blood pressure when checked at home has  been in the 150/80 range.   He is not having any chest pain.  He has had no significant shortness of  breath.   PAST MEDICAL HISTORY:  Allergies:  1. FOLIC ACID.  2. LIPITOR.  3. CODEINE.   Medications:  Coreg, thyroid, benazepril, metformin, Plavix, aspirin,  Zocor and clonidine.  Currently he is taking 0.2 of clonidine in the  evening.   Other medical problems:  See the complete list on the note of April 10, 2007.   REVIEW OF SYSTEMS:  He feels fine and his review of systems is negative.   PHYSICAL EXAMINATION:  Blood pressure today reveals pressure of 220/98  in his left arm.  He is oriented to person, time and place.  Affect is  normal.  He is here with his wife today.  LUNGS:  Clear.  Respiratory effort is not labored.  CARDIAC EXAM:  Reveals an S1 with an S2.  There are no clicks or  significant murmurs.  ABDOMEN:  Soft.  He has no peripheral edema.   I am concerned about his blood pressure.  He will add clonidine to his  medications as soon as he arrives home.  He will then take his evening  dose of clonidine later.  He will check his blood pressure carefully  over the next few days and increase his clonidine dose if his pressure  is not under control.  If there are any ongoing questions he will call  us.   I will see him back in 8 weeks for follow-up.     Luis Abed, MD, [facility]  Electronically Signed    JDK/MedQ  DD: 06/23/2007  DT: 06/24/2007  Job #: [id number]

## 2010-09-29 NOTE — Assessment & Plan Note (Signed)
[facility]                            CARDIOLOGY OFFICE NOTE   NAME:VENABLEDelia, Sitar                     MRN:          [id number]  DATE:08/31/2007                            DOB:          1925/12/06    Rev. Mcbroom is doing well.  His blood pressure is under much better  control today.  He is not having any chest pain, shortness of breath,  syncope or presyncope.  I had seen him last in February 2009.  He is  doing very well at this time.   PAST MEDICAL HISTORY:   ALLERGIES:  FOLIC ACID, LIPITOR, CODEINE.   MEDICATIONS:  Coreg, thyroid, benazepril, terbinafine, metformin,  Plavix, aspirin, Zocor.  Clonidine at 0.2 mg at night.   OTHER MEDICAL PROBLEMS:  See the complete list on the note of April 10, 2007.   REVIEW OF SYSTEMS:  He is doing very well and feels well today.  He is  here with his family, as always.   PHYSICAL EXAMINATION:  Weight is down a few pounds at 178.  Blood  pressure is excellent at 130/82, with a pulse of 56.  The patient is  oriented to person, time and place.  Affect is normal.  HEENT:  Reveals no xanthelasma.  He has normal extraocular motion.  There are no carotid bruits.  There is no jugular venous distention.  LUNGS:  Clear.  Respiratory effort is not labored.  CARDIAC:  Exam reveals S1 and S2.  There are no clicks or significant  murmurs.  ABDOMEN:  Soft.  EXTREMITIES:  No peripheral edema.   Rev. Grochowski is stable.  I will see him back in 3 months.  No change in  his medications.     Luis Abed, MD, [facility]  Electronically Signed    JDK/MedQ  DD: 08/31/2007  DT: 08/31/2007  Job #: [id number]

## 2010-09-29 NOTE — Assessment & Plan Note (Signed)
[facility]                            CARDIOLOGY OFFICE NOTE   NAME:Barile, KALEL HARTY                     MRN:          [id number]  DATE:02/13/2008                            DOB:          1925/05/25    [name] is here for followup.  He has noted a new problem with  ecchymoses in his arm.  He has had some on his face.  He has known  coronary disease.  His last intervention was done in October 2008.  He  received a Vision stent at that time to the vein graft in the  circumflex.  This is a bare-metal stent.  Since that time, he says he  has had some ecchymoses in his skin but it is getting worse.  He wants  my advice concerning this.   An additional new problem is that of some gait abnormalities.  He is not  having any true falling episodes.  He has mild dizziness.  I am not  convinced that this represents a significant problem at this time.  He  is not having any significant chest pain.  He is not having any  significant shortness of breath.  He has had dizziness as mentioned.  He  is not feeling any significant palpitations.  His blood pressure is a  chronic problem, but it is controlled.  He is not having any problems  with bradycardia.  His GERD is stable.  The patient also mentions some  mild muscle aches.   ALLERGIES:  FOLIC ACID, LIPITOR, and CODEINE.   MEDICATIONS:  Coreg, thyroid, benazepril, metformin, Plavix (to be  stopped), aspirin 325, Zocor 40, clonidine 0.2 in the morning and 0.4 in  the evening, and glimepiride.   PAST MEDICAL HISTORY:  He had significant low back pain in the past.  Fortunately, this is stabilized over time.   REVIEW OF SYSTEMS:  He is not having any GI symptoms or GU symptoms.  There is no fever.  Otherwise, his review of systems is negative.   PHYSICAL EXAMINATION:  VITAL SIGNS:  Blood pressure is 149/75 with a  pulse of 52.  GENERAL:  The patient is oriented to person, time, and place.  Affect is  normal.  HEENT:  No xanthelasma.  He has normal extraocular motion.  NECK:  There are no carotid bruits.  There is no jugular venous  distention.  LUNGS:  Clear.  Respiratory effort is not labored.  CARDIAC:  S1-S2.  There are no clicks, rubs, or significant murmurs.  ABDOMEN:  Soft.  He has no significant peripheral edema.   No labs were done today.   PROBLEMS INCLUDE:  1. Coronary artery disease.  I have re-reviewed his records.  He is      post CABG.  He also had a bare-metal stent placed to his SVG to the      circumflex in October 2008.  He is now 1 year out and because of      his significant ecchymosis, his Plavix can be stopped.  2. Ecchymoses.  It is safe to stop his Plavix at this time  and this      will be done.  3. Hypercholesterolemia.  On medication.  4. Hypertension.  I spoke with him again about the dosing of his      clonidine.  This sometimes he will vary this during the day in      terms of timing of the meds.  There is no problem with this as long      as he continues to take clonidine.  5. History of a soft systolic murmur.  I do not hear murmur today.  6. History of mild dizziness and some slight gait disturbance.  I have      not inclined to work this up any further at this point.  The      patient will stop his Plavix.  We will check a CPK for his muscle      weakness.  He will continue however to go about full activities.  I      will then see him back for followup.     Luis Abed, MD, [facility]  Electronically Signed    JDK/MedQ  DD: 02/13/2008  DT: 02/13/2008  Job #: [id number]

## 2010-09-29 NOTE — Assessment & Plan Note (Signed)
[facility]                            CARDIOLOGY OFFICE NOTE   NAME:Crowell, SY SAINTJEAN                     MRN:          [id number]  DATE:06/19/2008                            DOB:          [birth year]    [name] is doing very well.  He is now 77 years of age.  He is  getting ready to make his fourteenth trip to [place].  He says this will  be his last one.  When he goes, he takes others, and that has very  educational experience for them.  He has known coronary disease.  His  last intervention was done in October 2008.  He is stable.  He is not  having any chest pain.  He has had no syncope or presyncope.  He has no  significant shortness of breath.   PAST MEDICAL HISTORY:   ALLERGIES:  FOLIC ACID, LIPITOR, and CODEINE.   MEDICATIONS:  Simvastatin, Coreg, thyroid, benazepril, metformin,  aspirin, glimepiride, and clonidine.   OTHER MEDICAL PROBLEMS:  See the list below.   REVIEW OF SYSTEMS:  He is not having any fevers or chills.  He is not  having any headaches.  There is no GI or GU symptoms.  There are no skin  rashes.  His review of systems otherwise is negative.   PHYSICAL EXAMINATION:  VITAL SIGNS:  Blood pressure today 136/76 is  quite good for him.  His pulse is 50.  GENERAL:  He has chronic bradycardia and is stable with this.  HEENT:  No xanthelasma.  He has normal extraocular motion.  NECK:  There are no carotid bruits.  There is no jugular venous  distention.  LUNGS:  Clear.  Respiratory effort is not labored.  CARDIAC:  An S1 with an S2.  There are no clicks or significant murmurs.  ABDOMEN:  Soft.  EXTREMITIES:  He has no peripheral edema.   EKG reveals old diffuse ST-T wave changes and old significant sinus  bradycardia.   PROBLEMS:  #1.  Coronary artery disease.  He is post coronary artery  bypass graft in the past.  He had a bare-metal stent placed to saphenous  vein graft and the circumflex in October 2008.  He has  done very well.  #2.  Hypercholesterolemia, on medications.  #3.  Hypertension.  His blood pressure is under better control than  usual.  I am not inclined to change his medications.  #4.  Systolic murmur.  He has no significant murmurs today.  #5.  History of some slight dizziness.  This is unchanged.  #6.  Sinus bradycardia.  I have consider changing his medications.  However, he is perfectly stable and I have chosen not to.  I will see  him back for followup.     Luis Abed, MD, [facility]  Electronically Signed    JDK/MedQ  DD: 06/19/2008  DT: 06/19/2008  Job #: [id number]

## 2010-09-29 NOTE — Discharge Summary (Signed)
NAMEBROOKES, CRAINE NO.:  [id number]   MEDICAL RECORD NO.:  [id number]          PATIENT TYPE:  INP   LOCATION:  2301                         FACILITY:  [facility]   PHYSICIAN:  Jesse Sans. Wall, MD, FACCDATE OF BIRTH:  1925/05/28   DATE OF ADMISSION:  03/10/2007  DATE OF DISCHARGE:  03/12/2007                               DISCHARGE SUMMARY   PROCEDURES:  1. Cardiac catheterization.  2. Coronary arteriogram.  3. Left ventriculogram.  4. LIMA arteriogram.  5. PTCA and stent of the SVG to the distal circumflex.  6. Saphenous vein graft angiography x2.   FINAL DISCHARGE DIAGNOSIS:  Non-ST segment elevation MI.   SECONDARY DIAGNOSES:  1. Status post aortocoronary bypass surgery in 1994 with LIMA to LAD,      SVG to a D1, SVG to OM-1 and OM-2 and OM-3.  2. Hypertension.  3. History of palpitations.  4. 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  5. Obesity.  6. Allergy or intolerance to FOLIC ACID, NORVASC and CARDENE.  7. Hypothyroidism.  8. History of dizziness.  9. History of systolic murmur and bradycardia.  10.Gastroesophageal reflux disease.  11.History of low back pain.   TIME AT DISCHARGE:  42 minutes.   HOSPITAL COURSE:  Mr. Sada is a 77 year old male with a history of  coronary artery disease.  He had significant chest pain the night before  admission and came to the emergency room the next day.  His initial  cardiac enzymes were elevated indicating a non-ST segment elevation MI,  likely with the episode of severe pain last p.m. He was admitted and  taken to the cath lab.   His troponin was 10.6 with a CK-MB of 1051/108.8.  Lipid profile showed  a total cholesterol of 182, triglycerides 84, HDL 35, LDL 130.  His TSH  is only minimally elevated at 5.53.  He was taken to the cath lab where  he had severe native three-vessel disease with a total LAD, circumflex  and RCA.  The SVG to diagonal had been occluded on  catheterization in  2006. The SVG to OM-2, OM-3 and posterolateral was patent in the first  and third limbs but the second limb was totally occluded. This was  chronic.  There was also a new 95% lesion in the ostium of the SVG.  The  LIMA to LAD was patent and there was some mild diffuse disease in the  distal LAD.  His EF was 60%.  The focal aneurysm at the apex was  unchanged.  Dr. Gala Romney evaluated the films and felt that percutaneous  intervention of the vein graft was indicated.  This was performed with a  bare metal stent reducing the stenosis to zero.  He tolerated the  procedure well.   Mr. Ono was held until March 12, 2007.  He needed some p.r.n.  medication for blood pressure control but this was discontinued. He was  continued on his home dose of Synthroid. He was on an ACE inhibitor and  a beta blocker as well as Pravachol 40 mg.  By March 12, 2007, he was  ambulating without chest pain or shortness of breath and considered  stable for discharge with outpatient follow-up arranged.   DISCHARGE INSTRUCTIONS:  His activity level is to be increased  gradually.  He is to call our office for any problems with the cath  site.  He is to follow up with Dr. Myrtis Ser and a message will be left at  the office.  He is to follow up with his family physician as well.   DISCHARGE MEDICATIONS:  1. Levothyroxine 25 mcg daily.  2. Benazepril 40 mg a day.  3. Catapres 0.1 mg a.m. and 0.2 mg p.m.  4. Plavix 75 mg daily.  5. Lamisil 250 mg daily for 2 weeks.  6. Actos 45 mg a day.  7. Avandamet is on hold until tomorrow.  8. Toprol XL was discontinued.  9. Omega III as prior to admission.  10.Plavix 75 mg daily.  11.Nitroglycerin sublingual p.r.n.  12.Pravachol 40 mg a day.  13.Aspirin 325 mg daily.  14.Coreg 12.5 mg b.i.d.      Theodore Demark, PA-C      Jesse Sans. Daleen Squibb, MD, [facility]  Electronically Signed    RB/MEDQ  D:  03/12/2007  T:  03/13/2007  Job:  [id number]   cc:    Luis Abed, MD, [facility]  Titus Dubin. Alwyn Ren, MD,FACP,FCCP

## 2010-09-29 NOTE — Assessment & Plan Note (Signed)
[facility]                            CARDIOLOGY OFFICE NOTE   NAME:[name], [name]                     MRN:          [id number]  DATE:04/10/2007                            DOB:          [birth year]    [name] is seen for followup.  See my complete note of March 14, 2007.  He is actually doing well.  He did have a cardiac event.  He  was treated and eventually received a bare metal stent to the vein graft  to the circumflex on March 10, 2007.  He is doing well, and he can now  increase his activities.  He is not having any chest pain.  There is no  syncope or presyncope.  He is not having any significant shortness of  breath.  He and I have talked about increasing his activities, and he is  stable with this.  We have also talked about his blood pressure, and we  continue to modify the approach to his medications very carefully.  He  is very attentive to checking his own blood pressure.   PAST MEDICAL HISTORY:   ALLERGIES:  FOLIC ACID, LIPITOR, AND CODEINE.   MEDICATIONS:  1. Coreg 12.5.  2. Levothyroxine.  3. Benazepril.  4. Omega-3.  5. Vitamins.  6. Metformin.  7. Plavix.  8. Aspirin.  9. Zocor.  10.Clonidine.  11.We have decided to have him take his Clonidine in an unusual      fashion.  He will take only 1 dose a day of 0.2 mg later in the      day.  He will take all of his other medicines in the morning.   OTHER MEDICAL PROBLEMS:  See the list below.   REVIEW OF SYSTEMS:  He is feeling well other than some mild weakness and  dizziness in the late morning after he takes his morning medicines.   PHYSICAL EXAM:  Weight is 179 pounds.  Blood pressure is 140/87 with a  pulse of 50.  The patient is oriented to person, time, and place.  Affect is normal.  HEENT:  No xanthelasma.  He has normal extraocular motion.  There are no carotid bruits.  There is no jugular venous distention.  LUNGS:  Clear.  Respiratory effort is  not labored.  CARDIAC:  An S1 with an S2.  There are no clicks or significant murmurs.  ABDOMEN:  Soft.  He has no peripheral edema.   His EKG reveals that he does have sinus bradycardia.  We will follow  this.  We may have to decrease his Coreg dose, although he appears to be  stable with the current dose.   Problems include:  1. History of CABG in 1994.  2. Status post percutaneous coronary interventions with a bare metal      stent to the vein graft to the circumflex on March 10, 2007.  3. Hypercholesterolemia, treated.  4. Hypertension.  His hypertension is treated.  We continue to modify      his medications, and he will not take his morning dose of  Clonidine, but he is stable to take 0.2 of Clonidine later in the      day.  5. History of soft, systolic murmur.  6. History of mild dizziness.  See the discussion above.  7. History of gingival hypertrophy from Cardene in the past.  8. Mild bradycardia on his beta blocker, and this is followed.  9. Gastroesophageal reflux disease.  10.History of low back pain, and it is stable.   He is stable, and I will see him back in 3 months.     Luis Abed, MD, [facility]  Electronically Signed    JDK/MedQ  DD: 04/10/2007  DT: 04/10/2007  Job #: [id number]

## 2010-09-29 NOTE — H&P (Signed)
NAMEAYVIN, LIPINSKI NO.:  [id number]   MEDICAL RECORD NO.:  [id number]          PATIENT TYPE:  EMS   LOCATION:  MAJO                         FACILITY:  [facility]   PHYSICIAN:  Luis Abed, MD, FACCDATE OF BIRTH:  1926-01-16   DATE OF ADMISSION:  03/09/2007  DATE OF DISCHARGE:                              HISTORY & PHYSICAL   Mr. Claw came to the emergency room today with a vague chest  sensation.  He is a difficult historian.  After talking to him at great  length, it now becomes clear that the patient had significant nausea and  chest discomfort all night last night.  During the day today he did not  feel as well as usual and there was a slight sensation that he thought  was mild indigestion.  He was strongly encouraged to come to the  emergency room and he actually had his family drive him back from  [place], [place] to the emergency room here.  His symptoms now  are 99% gone.  His enzymes are definitely abnormal.  EKG reveals diffuse  nonspecific ST, T wave changes.  These do look like ischemia.  However,  he has had changes like this before.   The patient has known coronary disease.  His last cath was done in 2006.  At that time he had severe native coronary disease.  There was preserved  LV function with an apical aneurysm.  He had a patent LIMA to the LAD.  There was an occluded SVG to the diagonal.  There was a patent jump  graft to the first OM with occlusion after the first anastomosis.   The patient also has severe hypertension.   PAST MEDICAL HISTORY:   ALLERGIES:  QUESTION OF ALLERGY TO FOLIC ACID.  SOME SWELLING FROM  NORVASC.  DIFFICULTY WITH LIPITOR BUT HE TAKES ZOCOR.  HISTORY OF  GINGIVAL HYPERTROPHY FROM CARDENE IN THE PAST.   MEDICATIONS:  Thyroid, Zocor, benazepril, Omega-3, eye drops, Toprol XL  50, Avandamet and Clonidine.   OTHER MEDICAL PROBLEMS:  See the list below.   SOCIAL HISTORY:  The patient is a traveling  Warehouse manager.  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  He is married.  He does  not smoke.   FAMILY HISTORY:  The family history is noncontributory.   REVIEW OF SYSTEMS:  As mentioned, some of his symptoms seemed like  nausea.  He is not having any significant GI or GU symptoms.  His review  of systems otherwise are negative.   PHYSICAL EXAMINATION:  VITAL SIGNS:  Blood pressure is 170/98.  His  pulse rate is 50.  Respiration is 21.  Temperature is 98.  GENERAL APPEARANCE:  The patient is oriented to person, time and place.  Affect is normal.  He is here with his wife and son.  HEENT:  Reveals no xanthelasma.  He has normal extraocular motion.  NECK:  There are no carotid bruits.  There is no jugular venous  distension.  CARDIAC EXAM:  Reveals an S1 with an S2. There are  no clicks or  significant murmurs.  ABDOMEN:  Soft.  He has no masses or bruits.  He has no significant  peripheral edema.  He has 1+ distal pulses.  MUSCULOSKELETAL:  There are no major musculoskeletal deformities.   EKG is described above with ST, T wave changes.  His MB is 59 and 61.  Troponin is 2.3 and 3.3.   IMPRESSION:  1. Question of allergy to folic acid, difficulties with Lipitor,      gingival hypertrophy from Cardene in the past.  2. Coronary disease.  See the anatomy as described above.  3. The patient is post CABG.  4. Hypercholesterolemia on medication.  5. Hypertension.  6. Hypothyroidism.  He currently is listed as being on thyroid      medicine. This must be a recent addition to his medicine since I      have seen him last.  7. Soft systolic murmur by history.  8. Mild dizziness by history.  9. Mild bradycardia, stable.  10.GERD.  11.Low back pain.  **12.  Non STEMI.  The patient clearly had his worst symptoms last  night.  I believe he infarcted at that time.  He is not having  significant symptoms right now.  I am concerned that there has been some  troponin rise  between blood studies today.  However, I feel that he is  not a candidate at this point for immediate trip to the cath lab.  The  patient will be treated with aspirin, a Statin.  He can receive a beta  blocker.  His heart rate is slow but he is on some baseline beta blocker  and this will be continued.  He will receive aspirin, IV heparin and he  will be loaded with Plavix.  He will also receive IV nitroglycerin.  Also he will receive an ACE inhibitor.   We are concerned about his overall status but he is hemodynamically  stable at this time.  He will need catheterization tomorrow.      Luis Abed, MD, [facility]  Electronically Signed     JDK/MEDQ  D:  03/09/2007  T:  03/09/2007  Job:  [id number]   cc:   Titus Dubin. Alwyn Ren, MD,FACP,FCCP

## 2010-09-29 NOTE — Cardiovascular Report (Signed)
NAMEBURNETTE, SAUTTER NO.:  [id number]   MEDICAL RECORD NO.:  [id number]          PATIENT TYPE:  INP   LOCATION:  2301                         FACILITY:  [facility]   PHYSICIAN:  Bevelyn Buckles. Bensimhon, MDDATE OF BIRTH:  June 23, 1925   DATE OF PROCEDURE:  03/10/2007  DATE OF DISCHARGE:                            CARDIAC CATHETERIZATION   PATIENT INDICATIONS:  [name] is a very pleasant, 77 year old male  with known coronary artery disease status post bypass surgery in 1994.  He also has a history of diabetes and hypertension. He was admitted with  a 2-day history of chest pain. Cardiac enzymes were positive with a  troponin of about 9.  He was brought to the lab due to ongoing mild  ongoing chest pain.  On arrival to the lab, we was painfree.  Diagnostic  angiography was performed.   PROCEDURES PERFORMED:  1. Selective coronary angiography.  2. LIMA angiography.  3. Saphenous vein graft angiography x2.  4. Left heart catheterization.  5. Left ventriculogram.   DESCRIPTION OF PROCEDURE:  The risks and indication of the  catheterization were explained, consent was signed and placed on the  chart.  A 6-French arterial sheath was placed in the right femoral  artery using a modified Seldinger technique.  A JL-4 was used to image  the left coronary system and JR-4 was used to image the right coronary  system. An IM  catheter was used to image the LIMA and low saphenous  vein grafts.  All catheter exchanges made over a wire.  There were no  apparent complications.   HEMODYNAMIC RESULTS:  Central aortic pressure is 164/79 with a mean of  114.  LV pressure was 153/9 with an EDP of 16.  There was no aortic  stenosis on pullback across the aortic valve.   CORONARY ANATOMY.:  The left main had some minor luminal irregularities.   LAD was totally occluded proximally after the takeoff of septal  perforator.  There was a tubular 90% stenosis in the ostial LAD.   The left  circumflex gave off a small but long ramus branch as well as a  small OM-1 before being totally occluded. There was an 80% lesion in the  ostial ramus.   The right coronary artery was a small vessel that was totally occluded  proximally.   The saphenous vein to the diagonal was totally occluded.  This was  chronic.   The saphenous vein graft to the OM-2 was sequential to the OM-3 and  another sequential to the posterolateral was patent in its first and  third limbs. The second limb was totally occluded.  This was chronic.  There was a new 95% lesion in the ostium of the saphenous vein graft.   The LIMA to the LAD was widely patent. There was some mild diffuse  disease in the native LAD.   Left ventriculogram done in the RAO position showed an ejection fraction  of about 60%.  There was a focal aneurysm at the apex.   ASSESSMENT:  1. Severe native three-vessel coronary artery disease.  2. The left internal mammary artery to  the left anterior descending is      patent.  3. Saphenous vein graft to the diagonal is chronically totally      occluded.  4. The saphenous vein sequential graft to the OM-2, OM-3 and      posterolateral is patent in the first and third limbs. The second      limb is chronically occluded. The third limb fills through the      native circumflex.  There is a 95% ostial lesion in the saphenous      vein graft which is new.  5. Normal left ventricular ejection fraction with focal apical      aneurysm.   PLAN/DISCUSSION:  I have reviewed the films with Dr. Riley Kill. We will  plan for percutaneous intervention of the saphenous vein graft to the OM-  2 system.      Bevelyn Buckles. Bensimhon, MD  Electronically Signed     DRB/MEDQ  D:  03/10/2007  T:  03/11/2007  Job:  [id number]

## 2010-09-29 NOTE — Assessment & Plan Note (Signed)
[facility]                            CARDIOLOGY OFFICE NOTE   NAME:[name], [name]                     MRN:          [id number]  DATE:03/14/2007                            DOB:          December 01, 1925    [name] is seen for cardiology followup.  I had seen him last  on December 20, 2005.  At that time, he was stable.  He has known  significant coronary disease.  He presented to [facility] on March 09, 2005.  He had had significant chest pain the night before.  This had  been both nausea and chest pain.  During the day of admission, he was  actually in [place], [place].  Ultimately, he came back to be  seen at [facility].  His first troponin was 2.3 and his first MB was 59,  and he was admitted.  He stated that he felt better at that point.  On  that basis, we decided not to take him to the cath lab urgently.  He did  undergo catheterization the following day.  He had a total LAD,  circumflex, and right vein graft to the diagonal was occluded in the  past.  Vein graft to OM2 and OM3, and the posterior lateral was patent  in the first and third limbs, but the second was occluded.  This was  chronic.  There was a new 95% osteal lesion of a vein graft.  LIMA to  the LAD was patent, and there was mild diffuse distal disease of the LAD  and his ejection fraction was 60%.  There was a small focal aneurysm at  the apex that was unchanged.  It was felt that he should undergo  intervention.  He received a bare metal stent by Dr. Riley Kill.  The area  was reduced from 95% to 0% with an excellent angiographic appearance.  There were no complications.  Therefore, this was a successful PCI with  stenting using distal protection of the saphenous vein graft to the  circumflex.  He was put on aspirin and Plavix.  The patient stabilized  and was discharged home.  He is now seen in the hospital very early post  discharge and he is doing well.  He is not  having any recurrent  symptoms.   PAST MEDICAL HISTORY:   ALLERGIES:  Question of FOLIC ACID.  He had swelling from [facility].  He  had difficulties from LIPITOR, but he takes Zocor.  He had gingival  hypertrophy from [facility] in the past.   MEDICATIONS:  Coreg.  Levothyroxine.  Benazepril.  Omega 3.  Garlic.  Metformin.  Plavix.  Aspirin.  Zocor.  Clonidine.   OTHER MEDICAL PROBLEMS:  See the list below.   REVIEW OF SYSTEMS:  Fortunately, he is feeling better and he is stable  today.   PHYSICAL EXAM:  Blood pressure 140/80, pulse 75.  The patient was oriented to person, time, and place and he was here with  his wife.  HEENT:  Reveals no xanthelasma.  He has normal extraocular motions.  There are no carotid bruits.  There is no jugular venous distension.  LUNGS:  Clear. CARDIAC:  Reveals an S1 with an S2 but no clicks or  significant murmurs.  ABDOMEN:  Soft.  His cath site was nicely healed.  He had no significant  peripheral edema.   I carefully reviewed all of his medications.  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   PROBLEMS:  1. Coronary artery disease post CABG in the past, post PCI on March 10, 2005.  2. Hypercholesterolemia on medicine.  3. Hypertension.  Untreated.  4. Systolic murmur.  5. History of mild dizziness.  6. History of gingival hypertrophy from CARDENE.  7. Mild bradycardia, stable.  8. Gastroesophageal reflux disease, stable.  9. Low back pain, stable.   I will see him back for cardiology followup over the next several weeks.     Luis Abed, MD, [facility]  Electronically Signed    JDK/MedQ  DD: 03/14/2007  DT: 03/14/2007  Job #: [id number]   cc:   Titus Dubin. Alwyn Ren, MD,FACP,FCCP

## 2010-09-29 NOTE — Cardiovascular Report (Signed)
NAMEAMELIO, BROSKY NO.:  [id number]   MEDICAL RECORD NO.:  [id number]          PATIENT TYPE:  INP   LOCATION:  2301                         FACILITY:  [facility]   PHYSICIAN:  Arturo Morton. Riley Kill, MD, FACCDATE OF BIRTH:  1926/04/26   DATE OF PROCEDURE:  03/10/2007  DATE OF DISCHARGE:                            CARDIAC CATHETERIZATION   INDICATIONS:  The patient is 77 year old followed by Dr. Myrtis Ser.  He has  had prior revascularization surgery.  He presented with some ongoing  pain and elevated enzymes.  Current study was done by Dr. Gala Romney.  This revealed a high-grade ostial stenosis in his graft.  He also has a  ramus intermedius lesion of a moderately severe degree.  However, the  saphenous vein graft appeared to be critical and probably a thrombotic  stenosis.  He appeared quite large.  I discussed this with the patient,  then subsequently with his family.  He was agreeable to proceed.   PROCEDURE:  Percutaneous stenting of the saphenous vein graft to the  distal circumflex.   DESCRIPTION OF PROCEDURE:  The patient was brought to the  catheterization laboratory by Dr. Gala Romney and diagnostic procedure  performed.  There was an indwelling sheath.  After the above  discussions, we initially used a JR-4 with side holes which did not  provide good back up.  I then used a left bypass catheter.  This was  somewhat better, and we initially attempted to pass the spider device,  but there did not remain good seating.  Therefore, the spider device was  removed.  An AL-1 with side holes was then utilized, and provided  reasonable backup.  The BMW was placed down the vessel and then the  spider catheter delivered.  The spider wire was then placed in the  distal vessel.  There was good anchoring distally.  Dilatation was then  done with a 3.5-mm balloon.  It appear to be much larger than this, so a  4-mm Vision stent x 18 mm in length was carefully placed.  This was then  delivered at 16 atmospheres.  We can consider flaring the ostium, but at  this point, the patient was nearly off the table due to confusion and  discomfort despite combinations of Versed and fentanyl.  There appeared  to be an excellent angiographic result.  As a result, we recovered the  spider catheter, and all catheters were subsequently removed, and the  femoral sheath was sewn into place.  There was a moderate amount of  atheromatous debris in the basket.  I subsequently showed this to the  patient's family as well as the pre and post pictures.  Femoral sheath  was sewn into place, and he was taken to the holding area in  satisfactory clinical condition.   ANGIOGRAPHIC DATA:  Prior to intervention, there was a 90-95% stenosis  at the ostium of the saphenous vein graft.  This travels distally to the  circumflex system.  A distal limb was occluded and then fills back  through the native circumflex system where the second arm of the limb is  patent.  Following stenting,  this is reduced from 95 to 0% with an  excellent angiographic appearance.  There were no major complications.   CONCLUSION:  Successful percutaneous stenting using Spider protection,  the saphenous vein graft to the circumflex coronary artery.   DISPOSITION:  The patient will require aspirin and Plavix.  Follow-up  will be with Dr. Myrtis Ser.      Arturo Morton. Riley Kill, MD, [facility]  Electronically Signed     TDS/MEDQ  D:  03/10/2007  T:  03/11/2007  Job:  [id number]   cc:   Arturo Morton. Riley Kill, MD, [facility]  CV Laboratory  Luis Abed, MD, [facility]

## 2010-10-02 ENCOUNTER — Other Ambulatory Visit: Payer: Self-pay | Admitting: Internal Medicine

## 2010-10-02 ENCOUNTER — Telehealth: Payer: Self-pay | Admitting: Internal Medicine

## 2010-10-02 MED ORDER — BENAZEPRIL HCL 40 MG PO TABS: 40.0000 mg | ORAL_TABLET | Freq: Every day | ORAL | Status: DC

## 2010-10-02 NOTE — Cardiovascular Report (Signed)
NAMEXAVIAR, LUNN NO.:  [id number]   MEDICAL RECORD NO.:  [id number]          PATIENT TYPE:  OIB   LOCATION:  2899                         FACILITY:  [facility]   PHYSICIAN:  Vida Roller, M.D.   DATE OF BIRTH:  09-18-1925   DATE OF PROCEDURE:  08/20/2004  DATE OF DISCHARGE:                              CARDIAC CATHETERIZATION   HISTORY OF PRESENT ILLNESS:  Mr. Matsuoka is a 77 year old man status post  bypass surgery in 1994 where he received a LIMA to his LAD, saphenous vein  graft to the first diagonal, saphenous vein graft to the first obtuse  marginal, jump graft to the second obtuse marginal, and jump graft to the  third obtuse marginal.  He has hypertension and palpitations.  He had an  episode of palpitations, was evaluated, had a perfusion scan which showed an  anterior defect and was referred for heart catheterization.   PROCEDURE PERFORMED:  1.  Left heart catheterization.  2.  Selective coronary angiography.  3.  Saphenous vein graft angiography.  4.  Left internal mammary artery angiography.  5.  Left ventriculography.   DESCRIPTION OF PROCEDURE:  The patient was brought to the cardiac  catheterization laboratory in a fasting state.  There, he was prepped and  draped in the usual sterile manner and local anesthetic was obtained over  the right groin using 1% lidocaine without epinephrine.  The right femoral  artery was cannulated using the modified Seldinger technique with a 6 French  10 cm sheath and left heart catheterization was performed using a 6 French  Judkins left #4 which cannulated the left main coronary artery, a 6 French  Judkins right #4 which cannulated the right coronary artery, a 6 French  Amplatz left #1 which cannulated the saphenous vein graft to the diagonal  branch, a left coronary artery bypass catheter which cannulated the  saphenous vein graft to the obtuse marginal system, and a left internal  mammary artery catheter  which cannulated the left internal mammary artery.  A pigtail catheter was used for left ventriculography in the 30 degree RAO  view.  At the conclusion of the procedure, the catheters were removed, the  patient was moved back to the cardiology holding area where the femoral  artery sheath was removed.  Hemostasis was obtained using direct manual  pressure.  At the conclusion of the hold, there was no evidence of  ecchymosis or hematoma formation and distal pulses were intact.  Total  fluoroscopic time was 16.3 minutes.  Total iodinized contrast was 20 mL.   RESULTS:  Initial aortic pressure was measured at 215/89 with a mean  arterial pressure of 132 mmHg.  The patient required 10 mg Hydralazine and  20 mg Labetalol to bring his pressure under control.  His arterial pressure  after that was 141/62 with a mean arterial pressure of 94 mmHg.  The left  ventricular  pressure was 142/10 with an end diastolic pressure of 19 mmHg.   CORONARY ANGIOGRAPHY:  The left main coronary artery is a moderate caliber vessel which has luminal  irregularities.   The  left anterior descending  coronary artery is a moderate caliber vessel  which is occluded at its take off of the first septal perforator, it is seen  to fill the injections of the left internal mammary artery and is a  transapical vessel which has luminal irregularities.   The left circumflex coronary artery is a moderate caliber vessel which I  believe is a dominant vessel, it is occluded after a branching first obtuse  marginal.  It is seen to fill via injections of the saphenous vein graft  which anastomosis to the first obtuse marginal.  The ongoing AV groove  circumflex is severely diseased.  The first obtuse marginal appears to be  widely patent.  The second and third obtuse marginal are seen to fill late  and appear to be only partially filled but are adequate size vessels.  The  distal portion of the jump graft can be seen, it is the  portion between the  first and second anastomosis which is not well seen.   The right coronary artery is a small nondominant vessel which is severely  diseased.   The left internal mammary to the LAD touches down in the mid portion of the  vessel, it is patent in its ostium, body, and anastomosis.   The saphenous vein graft to the diagonal branch is occluded at its proximal  portion, the diagonal branch is never seen, there is a large ramus  intermedius branch which is not grafted and fills via perfusion through the  left main coronary artery.   The saphenous vein graft to the obtuse marginal system is patent in its  ostium, body, and initial anastomosis, it is occluded after that portion and  seen to fill distally as previously described.   Left ventriculography reveals 60% ejection fraction with a focal apical  aneurysm, no mitral regurgitation.   ASSESSMENT:  1.  Severe native coronary artery disease.  2.  Preserved left ventricular systolic function with an apical aneurysm.  3.  Patent LIMA to the LAD.  4.  Occluded saphenous vein graft to the diagonal branch.  5.  Patent jump graft to the first obtuse marginal with occlusion after the      first anastomosis.  6.  Severe hypertension.   PLAN:  My plan after discussing this case with Dr. Jerral Bonito is to manage  this man medically.  I  will add a beta blocker to his medical regimen and  will follow him from there.      JH/MEDQ  D:  08/20/2004  T:  08/20/2004  Job:  [id number]   cc:   Titus Dubin. Alwyn Ren, M.D. [facility]   Willa Rough, M.D.

## 2010-10-02 NOTE — Telephone Encounter (Signed)
Prescription was called to pharmacy on [address] and they say they havent heard from us--patient needs Benazepril 40 called into [facility], [address], [place], [place]

## 2010-10-02 NOTE — Telephone Encounter (Signed)
Left message on voicemail informing patient's wife rx called in to the pharmacy

## 2010-10-02 NOTE — Assessment & Plan Note (Signed)
[name]                              CARDIOLOGY OFFICE NOTE   NAME:[name]                     MRN:          [id number]  DATE:12/20/2005                            DOB:          09-09-1925    [name] is seen for cardiology followup.  He is adjusting the way he is  using his clonidine, and today his blood pressure is under better control.  I have given him permission to vary when he takes his very small dose of  clonidine.  I believe that this is safe for him.   He is not having any chest pain.  We discussed at length his coronary  status.  I made it clear that if he has return of symptoms we will push  harder and consider whether other interventions are needed.  In the  meantime, he needs blood pressure control and exercise and dietary control  and cholesterol control.   PAST MEDICAL HISTORY:   ALLERGIES:  1.  Question of allergy to FOLIC ACID.  2.  Some swelling from NORVASC>  3.  Difficulties with LIPITOR.  He tolerates Zocor.  4.  He had gingival hypertrophy from CARDENE in the past.   MEDICATIONS:  Thyroid, Zocor, benazepril, Omega-3, eye drops, Toprol-XL 50,  Avandamet, and clonidine.   OTHER MEDICAL PROBLEMS:  See the complete list below.   REVIEW OF SYSTEMS:  [name] asked me about Avandia.  I asked him to  check with Dr. Alwyn Ren about this.  Otherwise, his review of systems is  negative.   PHYSICAL EXAMINATION:  VITAL SIGNS:  Blood pressure is 128/78, with a  pulse  of 50.  LUNGS:  Clear.  Respiratory effort is not labored.  HEENT:  Reveals no xanthelasma.  He has normal extraocular motion.  CARDIAC:  Reveals an S1 with an S2.  There is a soft systolic murmur.  ABDOMEN:  Soft.  There are no masses or bruits.  He has no peripheral edema.   No labs are done today.   PROBLEMS:  1.  Coronary disease, stable.  I failed to mention aspirin in his medication      list.  I am certain that he is on aspirin, and we  will recheck this with      him and be sure it gets to his list.  2.  Hypercholesterolemia, on medication.  3.  Hypertension.  See the description above.  4.  Systolic murmur.  5.  Mild dizziness.  6.  History of gingival hypertrophy from Cardene.  7.  Mild bradycardia, stable.  8.  Gastroesophageal reflux disease.  9.  Low back pain, stable.   I will see the patient back for followup.                               Luis Abed, MD, [facility]    JDK/MedQ  DD:  12/20/2005  DT:  12/20/2005  Job #:  [id number]   cc:   Titus Dubin. Alwyn Ren, MD, FCCP

## 2010-10-19 ENCOUNTER — Other Ambulatory Visit: Payer: Self-pay | Admitting: Internal Medicine

## 2010-10-19 ENCOUNTER — Other Ambulatory Visit: Payer: Self-pay | Admitting: *Deleted

## 2010-10-19 DIAGNOSIS — E785 Hyperlipidemia, unspecified: Secondary | ICD-10-CM

## 2010-10-19 DIAGNOSIS — T887XXA Unspecified adverse effect of drug or medicament, initial encounter: Secondary | ICD-10-CM

## 2010-10-19 DIAGNOSIS — I10 Essential (primary) hypertension: Secondary | ICD-10-CM

## 2010-10-19 DIAGNOSIS — E119 Type 2 diabetes mellitus without complications: Secondary | ICD-10-CM

## 2010-10-20 ENCOUNTER — Other Ambulatory Visit (INDEPENDENT_AMBULATORY_CARE_PROVIDER_SITE_OTHER): Payer: Medicare Other

## 2010-10-20 DIAGNOSIS — I10 Essential (primary) hypertension: Secondary | ICD-10-CM

## 2010-10-20 DIAGNOSIS — T887XXA Unspecified adverse effect of drug or medicament, initial encounter: Secondary | ICD-10-CM

## 2010-10-20 DIAGNOSIS — E119 Type 2 diabetes mellitus without complications: Secondary | ICD-10-CM

## 2010-10-20 DIAGNOSIS — E785 Hyperlipidemia, unspecified: Secondary | ICD-10-CM

## 2010-10-20 LAB — HEPATIC FUNCTION PANEL
ALT: 18 U/L (ref 0–53)
AST: 19 U/L (ref 0–37)
Alkaline Phosphatase: 52 U/L (ref 39–117)
Total Bilirubin: 0.5 mg/dL (ref 0.3–1.2)

## 2010-10-20 LAB — LIPID PANEL
Cholesterol: 180 mg/dL (ref 0–200)
LDL Cholesterol: 125 mg/dL — ABNORMAL HIGH (ref 0–99)
Total CHOL/HDL Ratio: 5

## 2010-10-20 LAB — HEMOGLOBIN A1C: Hgb A1c MFr Bld: 7.4 % — ABNORMAL HIGH (ref 4.6–6.5)

## 2010-10-20 MED ORDER — LEVOTHYROXINE SODIUM 25 MCG PO TABS: 25.0000 ug | ORAL_TABLET | Freq: Every day | ORAL | Status: DC

## 2010-10-20 NOTE — Progress Notes (Signed)
Labs only

## 2010-10-20 NOTE — Telephone Encounter (Signed)
Patient is due for TSH check 244.9, this is mandatory to continue refilling meds

## 2010-10-21 LAB — MICROALBUMIN / CREATININE URINE RATIO
Creatinine,U: 158.8 mg/dL
Microalb, Ur: 2.5 mg/dL — ABNORMAL HIGH (ref 0.0–1.9)

## 2010-10-22 NOTE — Telephone Encounter (Signed)
Patient has appt for 10/27/10 at 9:00---I added TSH 244.9 to reason for visit

## 2010-10-23 NOTE — Telephone Encounter (Signed)
Noted  

## 2010-10-24 ENCOUNTER — Encounter: Payer: Self-pay | Admitting: Internal Medicine

## 2010-10-27 ENCOUNTER — Encounter: Payer: Self-pay | Admitting: Internal Medicine

## 2010-10-27 ENCOUNTER — Ambulatory Visit (INDEPENDENT_AMBULATORY_CARE_PROVIDER_SITE_OTHER): Payer: Medicare Other | Admitting: Internal Medicine

## 2010-10-27 VITALS — BP 140/78 | HR 56 | Wt 186.2 lb

## 2010-10-27 DIAGNOSIS — E039 Hypothyroidism, unspecified: Secondary | ICD-10-CM

## 2010-10-27 DIAGNOSIS — E119 Type 2 diabetes mellitus without complications: Secondary | ICD-10-CM

## 2010-10-27 DIAGNOSIS — E785 Hyperlipidemia, unspecified: Secondary | ICD-10-CM

## 2010-10-27 DIAGNOSIS — I10 Essential (primary) hypertension: Secondary | ICD-10-CM

## 2010-10-27 DIAGNOSIS — I2510 Atherosclerotic heart disease of native coronary artery without angina pectoris: Secondary | ICD-10-CM

## 2010-10-27 NOTE — Assessment & Plan Note (Signed)
LDL goal = < 100, ideally < 70. Risks discussed

## 2010-10-27 NOTE — Progress Notes (Signed)
  Subjective:    Patient ID: [name], male    DOB: [birth year], 77 y.o.   MRN: [id number]  HPI  #1 HYPERTENSION Disease Monitoring: Blood pressure range-140/78 ; Dr Myrtis Ser adjusred meds  Chest pain, palpitations- no       Dyspnea- no Medications: Compliance- yes  Lightheadedness,Syncope- no    Edema- only after prolonged standing    #2 DIABETES Disease Monitoring: Blood Sugar ranges-140-160  Polyuria/phagia/phagia/dipsia- polyuria only       Visual problems- no Medications: Compliance- yes  Hypoglycemic symptoms- no Diet: increased sweets     #3 HYPERLIPIDEMIA   Disease Monitoring: See symptoms for Hypertension Medications: Compliance- he D/Ced statin 2 months ago due to musculoskeletal pain  Abd pain, bowel changes- no   Muscle aches- spasms in legs @ night  ROS See HPI above   PMH Smoking Status noted :quit age 4       Review of Systems     Objective:   Physical Exam Gen.: Healthy and well-nourished in appearance. Alert, appropriate and cooperative throughout exam. Appears younger than age Eyes: No corneal or conjunctival inflammation noted.  Neck: No deformities, masses, or tenderness noted.  Thyroid  Normal Lungs: Normal respiratory effort; chest expands symmetrically. Lungs are clear to auscultation without rales, wheezes, or increased work of breathing. Heart: Normal rate and rhythm. Normal S1 and S2. No gallop, click, or rub. Grade 1/6 systolic murmur. Abdomen: Bowel sounds normal; abdomen soft and nontender. No masses, organomegaly or hernias noted. No clubbing or  cyanosis noted. OA finger changes. Trace edema. Nail health  good. Vascular: Carotid, radial artery, dorsalis pedis and dorsalis posterior tibial pulses are full and equal. No bruits present.Radiation of murmur into neck . Neurologic: Alert and oriented x3. Light touch normal over feet. Skin: Intact without suspicious lesions or rashes. Lymph: No cervical, axillary, or inguinal  lymphadenopathy present. Psych: Mood and affect are normal. Normally interactive                                                                                         Assessment & Plan:  See Problem List Assessments/Recommendations

## 2010-10-27 NOTE — Patient Instructions (Signed)
Review risks as discussed; reconsider  The diet changes & pravastatin 20 mg as discussed. Please  schedule fasting Labs : BMET,Lipids, hepatic panel,A1c, CK  In 10 weeks.  Preventive Health Care: Exercise at least 30-45 minutes a day,  3-4 days a week.  Eat a low-fat diet with lots of fruits and vegetables, up to 7-9 servings per day. Avoid obesity; your goal is waist measurement < 40 inches.Consume less than 40 grams of sugar per day from foods & drinks with High Fructose Corn Sugar as #2,3 or # 4 on label. Marland Kitchen

## 2010-10-27 NOTE — Assessment & Plan Note (Signed)
A1c goal = < 8% , ideally < 7% as long as no Hypoglycemia

## 2010-11-13 ENCOUNTER — Other Ambulatory Visit: Payer: Self-pay | Admitting: Internal Medicine

## 2010-11-13 ENCOUNTER — Telehealth: Payer: Self-pay | Admitting: *Deleted

## 2010-11-13 MED ORDER — LEVOTHYROXINE SODIUM 25 MCG PO TABS: 25.0000 ug | ORAL_TABLET | Freq: Every day | ORAL | Status: DC

## 2010-11-13 NOTE — Telephone Encounter (Signed)
mssg left on VM at home     KP

## 2010-11-13 NOTE — Telephone Encounter (Signed)
Pt called and states that his muscle pains and spasms are getting worse. He states the pain starts in his neck and goes all the way down his body. He states he is physically unable to come in today. He would like to know what he should do? Should he d/c pravastatin?

## 2010-11-13 NOTE — Telephone Encounter (Signed)
He MUST get CPK here or @ [facility] lab to assess this; he can stop statin but we need the CPK to make a definitive diagnosis. Code: [phone]

## 2010-11-16 NOTE — Telephone Encounter (Signed)
Left message to call office

## 2010-11-16 NOTE — Telephone Encounter (Signed)
Spoke w/ pt aware of information.  

## 2010-11-17 ENCOUNTER — Other Ambulatory Visit (INDEPENDENT_AMBULATORY_CARE_PROVIDER_SITE_OTHER): Payer: Medicare Other

## 2010-11-17 DIAGNOSIS — IMO0001 Reserved for inherently not codable concepts without codable children: Secondary | ICD-10-CM

## 2010-11-17 LAB — CK: Total CK: 228 U/L (ref 7–232)

## 2010-11-17 NOTE — Progress Notes (Signed)
Labs only

## 2010-11-27 ENCOUNTER — Other Ambulatory Visit: Payer: Self-pay | Admitting: Internal Medicine

## 2010-11-30 ENCOUNTER — Encounter: Payer: Self-pay | Admitting: Internal Medicine

## 2010-11-30 ENCOUNTER — Ambulatory Visit (INDEPENDENT_AMBULATORY_CARE_PROVIDER_SITE_OTHER): Payer: Medicare Other | Admitting: Internal Medicine

## 2010-11-30 DIAGNOSIS — M542 Cervicalgia: Secondary | ICD-10-CM

## 2010-11-30 DIAGNOSIS — Z951 Presence of aortocoronary bypass graft: Secondary | ICD-10-CM

## 2010-11-30 DIAGNOSIS — E785 Hyperlipidemia, unspecified: Secondary | ICD-10-CM

## 2010-11-30 DIAGNOSIS — M791 Myalgia, unspecified site: Secondary | ICD-10-CM

## 2010-11-30 DIAGNOSIS — E559 Vitamin D deficiency, unspecified: Secondary | ICD-10-CM

## 2010-11-30 DIAGNOSIS — M2550 Pain in unspecified joint: Secondary | ICD-10-CM

## 2010-11-30 DIAGNOSIS — IMO0001 Reserved for inherently not codable concepts without codable children: Secondary | ICD-10-CM

## 2010-11-30 DIAGNOSIS — Z9889 Other specified postprocedural states: Secondary | ICD-10-CM

## 2010-11-30 DIAGNOSIS — I2510 Atherosclerotic heart disease of native coronary artery without angina pectoris: Secondary | ICD-10-CM

## 2010-11-30 LAB — CK: Total CK: 161 U/L (ref 7–232)

## 2010-11-30 LAB — SEDIMENTATION RATE: Sed Rate: 13 mm/hr (ref 0–22)

## 2010-11-30 NOTE — Progress Notes (Signed)
Subjective:    Patient ID: [name], male    DOB: 1926-02-25, 77 y.o.   MRN: [id number]  HPI he states that he began to have diffuse muscle pain ("every muscle in my body") after  the Pravastatin  was started. He stopped pravastatin on 7/ 1; CK done on 7/3 was 228, within normal limits.  He continues to have muscle pain particularly in the neck. He states when  he turns  his head he has severe pain in the spine.  His past medical history was reviewed. Of concern is his history of coronary disease with previous bypass grafting. Additionally he is a diabetic.  In the context of these major risks; his LDL minimally less than 100, ideally less than 70.    Review of Systems minimally the neck pain will radiate down the left arm into the lateral aspect of the  hand. The arthralgia type pain responds to arthritis strength Tylenol.     Objective:   Physical Exam Gen.: Healthy and well-nourished in appearance. Alert, appropriate and cooperative throughout exam. Appears much younger than age Head: Normocephalic without obvious abnormalities Neck: No deformities, masses, or tenderness noted. Range of motion decreased. Thyroid normal. Lungs: Normal respiratory effort; chest expands symmetrically. Lungs are clear to auscultation without rales, wheezes, or increased work of breathing. Heart: Normal rate and rhythm. Normal S1 and S2. No gallop, click, or rub. Grade 1/6 systolic  murmur. Abdomen: Bowel sounds normal; abdomen soft and nontender. No masses, organomegaly or hernias noted.No AAA or bruits                                                                                  Musculoskeletal/extremities: No deformity or scoliosis noted of  the thoracic or lumbar spine but R thoracic muscles > L. No clubbing, cyanosis, edema, or deformity noted. Range of motion  normal .Tone & strength  Normal.Joints: DIP OA  changes . Nail health  good. Vascular: Carotid, radial artery, dorsalis pedis and   posterior tibial pulses are full and equal. No bruits present. Neurologic: Alert and oriented x3. Deep tendon reflexes symmetrical and normal.          Skin: Intact without suspicious lesions or rashes. Lymph: No cervical, axillary lymphadenopathy present. Psych: Mood and affect are normal. Normally interactive                                                                                         Assessment & Plan:  #1 subjective  Myalgias with normal CK  #2 degenerative joint disease on physical; in view of neck pain polymyalgia rheumatica must be ruled out. Possible cervical radiculopathy at C8 is suggested.  #3 coronary disease, status post bypass grafting  #4 diabetes  Plan: The pathophysiology of statin induced myalgias and risk/benefit of statins was discussed. One consideration would  be extremely low dose Crestor once a week.

## 2010-11-30 NOTE — Patient Instructions (Signed)
With Diabetes or Coronary Artery Disease , LDL or BAD cholesterol goal = < 100, ideally < 70. [employer]

## 2010-12-01 LAB — RHEUMATOID FACTOR: Rhuematoid fact SerPl-aCnc: <10 IU/mL (ref <=14)

## 2010-12-01 LAB — VITAMIN D 25 HYDROXY (VIT D DEFICIENCY, FRACTURES): Vit D, 25-Hydroxy: 45 ng/mL (ref 30–89)

## 2010-12-15 ENCOUNTER — Encounter: Payer: Self-pay | Admitting: Cardiology

## 2010-12-15 ENCOUNTER — Other Ambulatory Visit: Payer: Self-pay | Admitting: Internal Medicine

## 2010-12-15 ENCOUNTER — Ambulatory Visit (INDEPENDENT_AMBULATORY_CARE_PROVIDER_SITE_OTHER): Payer: Medicare Other | Admitting: Cardiology

## 2010-12-15 DIAGNOSIS — I2510 Atherosclerotic heart disease of native coronary artery without angina pectoris: Secondary | ICD-10-CM

## 2010-12-15 DIAGNOSIS — I10 Essential (primary) hypertension: Secondary | ICD-10-CM

## 2010-12-15 MED ORDER — LEVOTHYROXINE SODIUM 25 MCG PO TABS: 25.0000 ug | ORAL_TABLET | Freq: Every day | ORAL | Status: DC

## 2010-12-15 NOTE — Telephone Encounter (Signed)
Patient was suppose to have TSH in 10/2010 at [facility], it appears that labs were never done. Patient should have TSH 244.9

## 2010-12-15 NOTE — Patient Instructions (Signed)
Your physician recommends that you schedule a follow-up appointment in: 4 months  

## 2010-12-15 NOTE — Progress Notes (Signed)
HPI Patient is seen for followup of coronary disease and hypertension.  Actually doing well.  Blood pressure home has been stable. Allergies  Allergen Reactions  . Atorvastatin     REACTION: myalgias  . Folic Acid     REACTION: Face burns  . Nicardipine Hcl     REACTION: Gingival hypertrophy  . Pravastatin     myalgias    Current Outpatient Prescriptions  Medication Sig Dispense Refill  . acetaminophen (TYLENOL) 650 MG CR tablet Take 650 mg by mouth as needed.        Marland Kitchen aspirin 325 MG tablet Take 325 mg by mouth daily.        . benazepril (LOTENSIN) 40 MG tablet TAKE 1 TABLET BY MOUTH EVERY DAY  90 tablet  1  . carvedilol (COREG) 3.125 MG tablet        . Cholecalciferol (VITAMIN D) 1000 UNITS capsule 2 pills once daily except 3 on T, TH, & Sat       . cloNIDine (CATAPRES) 0.1 MG tablet Take 0.1 mg by mouth 2 (two) times daily.       Marland Kitchen glimepiride (AMARYL) 2 MG tablet Take half a tablet every morning.       Marland Kitchen glucose blood (ACCU-CHEK ACTIVE STRIPS) test strip Use one strip daily to test for sugar level 250.00       . Lancets (ACCU-CHEK SOFT TOUCH) lancets Use daily to check sugar level.       . metFORMIN (GLUCOPHAGE) 500 MG tablet TAKE 1 TABLET BY MOUTH TWICE A DAY WITH LARGEST MEALS  180 tablet  0  . levothyroxine (SYNTHROID, LEVOTHROID) 25 MCG tablet Take 1 tablet (25 mcg total) by mouth daily.  30 tablet  4  . pravastatin (PRAVACHOL) 20 MG tablet Take 20 mg by mouth daily.          History   Social History  . Marital Status: Married    Spouse Name: N/A    Number of Children: N/A  . Years of Education: N/A   Occupational History  . Not on file.   Social History Main Topics  . Smoking status: Former Games developer  . Smokeless tobacco: Not on file   Comment: Quit at age 25  . Alcohol Use: No  . Drug Use: No  . Sexually Active: Not on file   Other Topics Concern  . Not on file   Social History Narrative  . No narrative on file    Family History  Problem Relation Age of  Onset  . Diabetes    . Coronary artery disease Father     Past Medical History  Diagnosis Date  . CAD (coronary artery disease)     Bare-metal stent to SVG circumflex 2008 / nuclear March, 2012, scar distal anterior, anterolateral and apical, no ischemia  . Hypertension   . Ejection fraction     45%, echo, March, 2012  . Aortic insufficiency     Moderate, echo, March, 2012  . Mitral regurgitation     Moderate, echo, March, 2012  . Pulmonary hypertension     Echo, March, 2012, 42 mmHg  . Hx of CABG   . Diabetes mellitus   . Dyslipidemia   . Hypothyroidism   . Bradycardia     Sinus bradycardia  . RBBB (right bundle branch block with left anterior fascicular block)     New July, 2010  . Leg cramps     Nighttime  . Shingles     Severe pain  to right flank treated  . Dizziness     Mild, August, 2011  . Ischemia     .Marland KitchenMarland KitchenEF 42%  . Hyperlipidemia   . History of colonoscopy 2005    Past Surgical History  Procedure Date  . Coronary artery bypass graft 1996    5 vessels  . Back surgery 1999    LS sx- 1999  . Spinal fusion 2000    scar tissue, plate insertion/fusion @ [facility] - 2000    ROS  Patient denies fever, chills, headache, sweats, rash, change in vision, change in hearing, chest pain, cough, nausea vomiting, urinary symptoms.  All other systems are reviewed and are negative  PHYSICAL EXAM Patient is here with his wife.  He is oriented to person time and place.  Affect is normal.  Head is atraumatic.  Lungs are clear.  Respiratory effort is nonlabored.  Cardiac exam reveals S1-S2.  No clicks or significant murmurs.  Abdomen is soft.  There is no peripheral edema. Filed Vitals:   12/15/10 0957  BP: 166/90  Pulse: 61  Height: 5\' 9"  (1.753 m)  Weight: 181 lb (82.101 kg)    EKG Is not done today  ASSESSMENT & PLAN

## 2010-12-15 NOTE — Assessment & Plan Note (Signed)
Blood pressure stable. No change in therapy. 

## 2010-12-15 NOTE — Assessment & Plan Note (Signed)
Coronary disease is stable. No change in therapy. 

## 2011-01-11 ENCOUNTER — Other Ambulatory Visit: Payer: Self-pay | Admitting: Internal Medicine

## 2011-01-11 DIAGNOSIS — E119 Type 2 diabetes mellitus without complications: Secondary | ICD-10-CM

## 2011-01-11 DIAGNOSIS — E039 Hypothyroidism, unspecified: Secondary | ICD-10-CM

## 2011-01-11 DIAGNOSIS — E785 Hyperlipidemia, unspecified: Secondary | ICD-10-CM

## 2011-01-11 DIAGNOSIS — I2510 Atherosclerotic heart disease of native coronary artery without angina pectoris: Secondary | ICD-10-CM

## 2011-01-11 DIAGNOSIS — I10 Essential (primary) hypertension: Secondary | ICD-10-CM

## 2011-01-12 ENCOUNTER — Other Ambulatory Visit (INDEPENDENT_AMBULATORY_CARE_PROVIDER_SITE_OTHER): Payer: Medicare Other

## 2011-01-12 DIAGNOSIS — I10 Essential (primary) hypertension: Secondary | ICD-10-CM

## 2011-01-12 DIAGNOSIS — E039 Hypothyroidism, unspecified: Secondary | ICD-10-CM

## 2011-01-12 DIAGNOSIS — E119 Type 2 diabetes mellitus without complications: Secondary | ICD-10-CM

## 2011-01-12 DIAGNOSIS — I2510 Atherosclerotic heart disease of native coronary artery without angina pectoris: Secondary | ICD-10-CM

## 2011-01-12 DIAGNOSIS — E785 Hyperlipidemia, unspecified: Secondary | ICD-10-CM

## 2011-01-12 LAB — HEPATIC FUNCTION PANEL
AST: 19 U/L (ref 0–37)
Albumin: 3.9 g/dL (ref 3.5–5.2)
Alkaline Phosphatase: 49 U/L (ref 39–117)
Total Protein: 6.7 g/dL (ref 6.0–8.3)

## 2011-01-12 LAB — LIPID PANEL
Cholesterol: 173 mg/dL (ref 0–200)
LDL Cholesterol: 121 mg/dL — ABNORMAL HIGH (ref 0–99)
Triglycerides: 83 mg/dL (ref 0.0–149.0)

## 2011-01-12 LAB — BASIC METABOLIC PANEL
Calcium: 8.8 mg/dL (ref 8.4–10.5)
Chloride: 111 mEq/L (ref 96–112)
Creatinine, Ser: 1.4 mg/dL (ref 0.4–1.5)

## 2011-01-12 NOTE — Progress Notes (Signed)
Labs only

## 2011-01-18 ENCOUNTER — Other Ambulatory Visit: Payer: Self-pay | Admitting: Internal Medicine

## 2011-02-19 ENCOUNTER — Other Ambulatory Visit: Payer: Self-pay | Admitting: Internal Medicine

## 2011-02-24 LAB — CARDIAC PANEL(CRET KIN+CKTOT+MB+TROPI)
CK, MB: 108.8 — ABNORMAL HIGH
CK, MB: 62 — ABNORMAL HIGH
Relative Index: 10.3 — ABNORMAL HIGH
Relative Index: 7.5 — ABNORMAL HIGH
Total CK: 825 — ABNORMAL HIGH

## 2011-02-24 LAB — D-DIMER, QUANTITATIVE: D-Dimer, Quant: 0.25

## 2011-02-24 LAB — CBC
HCT: 39.2
Hemoglobin: 12.5 — ABNORMAL LOW
Hemoglobin: 13.6
MCHC: 34.5
MCHC: 34.8
RBC: 3.97 — ABNORMAL LOW
RBC: 4.18 — ABNORMAL LOW
RDW: 13.5
WBC: 11.2 — ABNORMAL HIGH
WBC: 8.8

## 2011-02-24 LAB — COMPREHENSIVE METABOLIC PANEL
AST: 70 — ABNORMAL HIGH
Albumin: 4
Alkaline Phosphatase: 57
BUN: 13
CO2: 23
Calcium: 9.5
Creatinine, Ser: 1
Creatinine, Ser: 1.04
GFR calc Af Amer: >60
GFR calc non Af Amer: >60
Glucose, Bld: 146 — ABNORMAL HIGH
Potassium: 4.6
Sodium: 139
Total Protein: 6.5
Total Protein: 6.7

## 2011-02-24 LAB — BASIC METABOLIC PANEL
Calcium: 8.5
GFR calc Af Amer: >60
GFR calc non Af Amer: 50 — ABNORMAL LOW
Sodium: 135

## 2011-02-24 LAB — DIFFERENTIAL
Basophils Absolute: 0
Basophils Relative: 0
Eosinophils Absolute: 0.1
Eosinophils Relative: 1
Monocytes Absolute: 0.7

## 2011-02-24 LAB — CK TOTAL AND CKMB (NOT AT ARMC)
CK, MB: 120 — ABNORMAL HIGH
Relative Index: 11.9 — ABNORMAL HIGH

## 2011-02-24 LAB — POCT CARDIAC MARKERS
CKMB, poc: 59.3
Myoglobin, poc: >500
Operator id: 257131
Troponin i, poc: 3.31

## 2011-02-24 LAB — B-NATRIURETIC PEPTIDE (CONVERTED LAB): Pro B Natriuretic peptide (BNP): 348 — ABNORMAL HIGH

## 2011-02-24 LAB — TSH: TSH: 5.553 — ABNORMAL HIGH

## 2011-02-24 LAB — LIPID PANEL: Triglycerides: 84

## 2011-02-24 LAB — HEPARIN LEVEL (UNFRACTIONATED): Heparin Unfractionated: 0.55

## 2011-03-04 ENCOUNTER — Other Ambulatory Visit: Payer: Self-pay | Admitting: Internal Medicine

## 2011-03-09 ENCOUNTER — Other Ambulatory Visit: Payer: Self-pay | Admitting: Cardiology

## 2011-03-20 ENCOUNTER — Other Ambulatory Visit: Payer: Self-pay | Admitting: Internal Medicine

## 2011-03-22 NOTE — Telephone Encounter (Signed)
TSH 244.8

## 2011-03-31 ENCOUNTER — Other Ambulatory Visit: Payer: Self-pay | Admitting: Internal Medicine

## 2011-03-31 MED ORDER — GLUCOSE BLOOD VI STRP: ORAL_STRIP | Status: DC

## 2011-03-31 NOTE — Telephone Encounter (Signed)
RX sent

## 2011-04-05 ENCOUNTER — Other Ambulatory Visit: Payer: Self-pay | Admitting: Cardiology

## 2011-04-06 ENCOUNTER — Other Ambulatory Visit: Payer: Self-pay | Admitting: Internal Medicine

## 2011-04-06 MED ORDER — BENAZEPRIL HCL 40 MG PO TABS: ORAL_TABLET | ORAL | Status: DC

## 2011-04-06 NOTE — Telephone Encounter (Signed)
RX sent

## 2011-04-20 ENCOUNTER — Ambulatory Visit (INDEPENDENT_AMBULATORY_CARE_PROVIDER_SITE_OTHER): Payer: Medicare Other | Admitting: Cardiology

## 2011-04-20 ENCOUNTER — Encounter: Payer: Self-pay | Admitting: Cardiology

## 2011-04-20 DIAGNOSIS — R55 Syncope and collapse: Secondary | ICD-10-CM

## 2011-04-20 DIAGNOSIS — R42 Dizziness and giddiness: Secondary | ICD-10-CM

## 2011-04-20 DIAGNOSIS — I2510 Atherosclerotic heart disease of native coronary artery without angina pectoris: Secondary | ICD-10-CM

## 2011-04-20 NOTE — Patient Instructions (Signed)
Your physician has recommended that you wear an event monitor. Event monitors are medical devices that record the heart's electrical activity. Doctors most often [place] these monitors to diagnose arrhythmias. Arrhythmias are problems with the speed or rhythm of the heartbeat. The monitor is a small, portable device. You can wear one while you do your normal daily activities. This is usually used to diagnose what is causing palpitations/syncope (passing out).  Your physician recommends that you schedule a follow-up appointment in: 3-4 weeks

## 2011-04-20 NOTE — Assessment & Plan Note (Signed)
The patient has had some mild dizziness.  However he does not describe classic vertigo.  Along with this he has had definite presyncope.  He has not had a true syncopal episode.  He has documented coronary disease.  He also has had some significant bradycardia in the past.  With these findings we need to place a monitor to assess his heart rhythm better.  This will be arranged.

## 2011-04-20 NOTE — Assessment & Plan Note (Signed)
Coronary disease is stable.  He is current event she did not sound ischemic in origin.

## 2011-04-20 NOTE — Progress Notes (Signed)
HPI  Patient is seen today for followup of coronary disease.  He is also had some dizziness with some presyncope.  He's not had any more palpitations.  He has not had complete syncope.  He is a very reliable historian.  He has had his blood pressure taken around the time of these events.  There is been no documented prolonged hypotension or prolonged bradycardia.  Allergies  Allergen Reactions  . Atorvastatin     REACTION: myalgias  . Folic Acid     REACTION: Face burns  . Nicardipine Hcl     REACTION: Gingival hypertrophy  . Pravastatin     myalgias    Current Outpatient Prescriptions  Medication Sig Dispense Refill  . acetaminophen (TYLENOL) 650 MG CR tablet Take 650 mg by mouth as needed.        Marland Kitchen amLODipine (NORVASC) 5 MG tablet TAKE ONE TABLET BY MOUTH DAILY  30 tablet  6  . aspirin 325 MG tablet Take 325 mg by mouth daily.        . benazepril (LOTENSIN) 40 MG tablet TAKE 1 TABLET BY MOUTH EVERY DAY  90 tablet  1  . carvedilol (COREG) 3.125 MG tablet daily.       . Cholecalciferol (VITAMIN D) 1000 UNITS capsule 2 pills once daily except 3 on T, TH, & Sat       . cloNIDine (CATAPRES) 0.1 MG tablet TAKE 1 TABLET BY MOUTH TWO TIMES A DAY  60 tablet  6  . dorzolamide (TRUSOPT) 2 % ophthalmic solution 1 drop both eyes in the evening      . glimepiride (AMARYL) 2 MG tablet TAKE 1/2 TABLET EVERY MORNING  45 tablet  1  . glucose blood (ACCU-CHEK ACTIVE STRIPS) test strip Use one strip daily to test for sugar level 250.00  100 each  3  . Lancets (ACCU-CHEK SOFT TOUCH) lancets Use daily to check sugar level.       Marland Kitchen LUMIGAN 0.03 % ophthalmic solution 1 drop both eyes in the am      . metFORMIN (GLUCOPHAGE) 500 MG tablet TAKE 1 TABLET BY MOUTH TWICE A DAY WITH LARGEST MEALS  180 tablet  0  . SYNTHROID 25 MCG tablet TAKE 1 TABLET BY MOUTH DAILY  30 tablet  0  . pravastatin (PRAVACHOL) 20 MG tablet Take 20 mg by mouth daily.          History   Social History  . Marital Status: Married    Spouse Name: N/A    Number of Children: N/A  . Years of Education: N/A   Occupational History  . Not on file.   Social History Main Topics  . Smoking status: Former Games developer  . Smokeless tobacco: Not on file   Comment: Quit at age 81  . Alcohol Use: No  . Drug Use: No  . Sexually Active: Not on file   Other Topics Concern  . Not on file   Social History Narrative  . No narrative on file    Family History  Problem Relation Age of Onset  . Diabetes    . Coronary artery disease Father     Past Medical History  Diagnosis Date  . CAD (coronary artery disease)     Bare-metal stent to SVG circumflex 2008 / nuclear March, 2012, scar distal anterior, anterolateral and apical, no ischemia  . Hypertension   . Ejection fraction     45%, echo, March, 2012  . Aortic insufficiency  Moderate, echo, March, 2012  . Mitral regurgitation     Moderate, echo, March, 2012  . Pulmonary hypertension     Echo, March, 2012, 42 mmHg  . Hx of CABG   . Diabetes mellitus   . Dyslipidemia   . Hypothyroidism   . Bradycardia     Sinus bradycardia  . RBBB (right bundle branch block with left anterior fascicular block)     New July, 2010  . Leg cramps     Nighttime  . Shingles     Severe pain to right flank treated  . Dizziness     Mild, August, 2011  . Ischemia     .Marland KitchenMarland KitchenEF 42%  . Hyperlipidemia   . History of colonoscopy 2005    Past Surgical History  Procedure Date  . Coronary artery bypass graft 1996    5 vessels  . Back surgery 1999    LS sx- 1999  . Spinal fusion 2000    scar tissue, plate insertion/fusion @ [facility] - 2000    ROS   Patient denies fever, chills, headache, sweats, rash, change in vision, change in hearing, chest pain, cough, nausea vomiting, urinary symptoms.  All other systems are reviewed and are negative other than the history of present illness.  PHYSICAL EXAM  Patient is oriented to person time and place.  Affect is normal.  He is here with his wife  today.  Head is atraumatic.  There is no xanthelasma.  There is no jugular venous distention.  Lungs are clear.  Respiratory effort is unlabored.  Cardiac exam reveals S1 and S2.  There are no clicks or significant murmurs.  Abdomen is soft.  There is no peripheral edema.  There no musculoskeletal deformities.  There is no skin rash.  Filed Vitals:   04/20/11 1055  BP: 143/79  Pulse: 57  Height: 5\' 9"  (1.753 m)  Weight: 184 lb (83.462 kg)     ASSESSMENT & PLAN

## 2011-04-23 ENCOUNTER — Other Ambulatory Visit: Payer: Self-pay | Admitting: Internal Medicine

## 2011-04-23 NOTE — Telephone Encounter (Signed)
TSH 244.9 

## 2011-04-27 ENCOUNTER — Encounter (INDEPENDENT_AMBULATORY_CARE_PROVIDER_SITE_OTHER): Payer: Medicare Other

## 2011-04-27 DIAGNOSIS — R42 Dizziness and giddiness: Secondary | ICD-10-CM

## 2011-04-27 DIAGNOSIS — R55 Syncope and collapse: Secondary | ICD-10-CM

## 2011-05-18 HISTORY — PX: CARDIAC CATHETERIZATION: SHX172

## 2011-05-24 ENCOUNTER — Other Ambulatory Visit: Payer: Self-pay | Admitting: Internal Medicine

## 2011-05-24 NOTE — Telephone Encounter (Signed)
TSH 244.9 

## 2011-05-27 ENCOUNTER — Ambulatory Visit (INDEPENDENT_AMBULATORY_CARE_PROVIDER_SITE_OTHER): Payer: Medicare Other | Admitting: Cardiology

## 2011-05-27 ENCOUNTER — Encounter: Payer: Self-pay | Admitting: Cardiology

## 2011-05-27 DIAGNOSIS — I2510 Atherosclerotic heart disease of native coronary artery without angina pectoris: Secondary | ICD-10-CM

## 2011-05-27 DIAGNOSIS — R42 Dizziness and giddiness: Secondary | ICD-10-CM

## 2011-05-27 NOTE — Assessment & Plan Note (Signed)
Coronary disease is stable. No change in therapy. 

## 2011-05-27 NOTE — Assessment & Plan Note (Signed)
The patient has not had any significant recurrent spells. His event recorder reveals no tachycardia. There is a resting heart rate averages 57. I had placed him on a very small dose of carvedilol. This will now be stopped. Otherwise no further workup.

## 2011-05-27 NOTE — Patient Instructions (Signed)
Your physician recommends that you schedule a follow-up appointment in: 8 weeks Your physician has recommended you make the following change in your medication: STOP Carvedilol

## 2011-05-27 NOTE — Progress Notes (Signed)
HPI  Patient is seen today to followup coronary disease and recent dizziness. When I saw him last week decided to have him wear an event recorder to be sure that he was not having any marked bradycardia or tachycardia arrhythmias. He did not have any significant pauses or tachyarrhythmias. I have reviewed the entire monitor completely myself. He's not had any recurrent marked spells. The monitor did show that his resting heart rate average is in the range of approximately 57.  Allergies  Allergen Reactions  . Atorvastatin     REACTION: myalgias  . Folic Acid     REACTION: Face burns  . Nicardipine Hcl     REACTION: Gingival hypertrophy  . Pravastatin     myalgias    Current Outpatient Prescriptions  Medication Sig Dispense Refill  . acetaminophen (TYLENOL) 650 MG CR tablet Take 650 mg by mouth as needed.        Marland Kitchen amLODipine (NORVASC) 5 MG tablet TAKE ONE TABLET BY MOUTH DAILY  30 tablet  6  . aspirin 325 MG tablet Take 325 mg by mouth daily.        . benazepril (LOTENSIN) 40 MG tablet TAKE 1 TABLET BY MOUTH EVERY DAY  90 tablet  1  . carvedilol (COREG) 3.125 MG tablet Take 3.125 mg by mouth daily.       . Cholecalciferol (VITAMIN D) 1000 UNITS capsule 2 pills once daily except 3 on T, TH, & Sat       . cloNIDine (CATAPRES) 0.1 MG tablet TAKE 1 TABLET BY MOUTH TWO TIMES A DAY  60 tablet  6  . dorzolamide (TRUSOPT) 2 % ophthalmic solution 1 drop both eyes in the evening      . glimepiride (AMARYL) 2 MG tablet TAKE 1/2 TABLET EVERY MORNING  45 tablet  1  . glucose blood (ACCU-CHEK ACTIVE STRIPS) test strip Use one strip daily to test for sugar level 250.00  100 each  3  . Lancets (ACCU-CHEK SOFT TOUCH) lancets Use daily to check sugar level.       Marland Kitchen LUMIGAN 0.03 % ophthalmic solution 1 drop both eyes in the am      . metFORMIN (GLUCOPHAGE) 500 MG tablet TAKE 1 TABLET BY MOUTH TWICE A DAY WITH LARGEST MEALS  180 tablet  0  . pravastatin (PRAVACHOL) 20 MG tablet Take 20 mg by mouth daily.         Marland Kitchen SYNTHROID 25 MCG tablet TAKE 1 TABLET BY MOUTH DAILY (DUE FOR LABS)  30 tablet  0    History   Social History  . Marital Status: Married    Spouse Name: N/A    Number of Children: N/A  . Years of Education: N/A   Occupational History  . Not on file.   Social History Main Topics  . Smoking status: Former Games developer  . Smokeless tobacco: Not on file   Comment: Quit at age 31  . Alcohol Use: No  . Drug Use: No  . Sexually Active: Not on file   Other Topics Concern  . Not on file   Social History Narrative  . No narrative on file    Family History  Problem Relation Age of Onset  . Diabetes    . Coronary artery disease Father     Past Medical History  Diagnosis Date  . CAD (coronary artery disease)     Bare-metal stent to SVG circumflex 2008 / nuclear March, 2012, scar distal anterior, anterolateral and apical, no ischemia  .  Hypertension   . Ejection fraction     45%, echo, March, 2012  . Aortic insufficiency     Moderate, echo, March, 2012  . Mitral regurgitation     Moderate, echo, March, 2012  . Pulmonary hypertension     Echo, March, 2012, 42 mmHg  . Hx of CABG   . Diabetes mellitus   . Dyslipidemia   . Hypothyroidism   . Bradycardia     Sinus bradycardia  . RBBB (right bundle branch block with left anterior fascicular block)     New July, 2010  . Leg cramps     Nighttime  . Shingles     Severe pain to right flank treated  . Dizziness     Mild, August, 2011  . Ischemia     .Marland KitchenMarland KitchenEF 42%  . Hyperlipidemia   . History of colonoscopy 2005  . Dizziness     December, 2012    Past Surgical History  Procedure Date  . Coronary artery bypass graft 1996    5 vessels  . Back surgery 1999    LS sx- 1999  . Spinal fusion 2000    scar tissue, plate insertion/fusion @ [facility] - 2000    ROS   Patient denies fever, chills, headache, sweats, rash, change in vision, change in hearing, chest pain, cough, nausea vomiting, urinary symptoms. All the systems  are reviewed and are negative.  PHYSICAL EXAM  Patient is stable. He is oriented to person time and place. Affect is normal. He's here with his wife. There is no jugular venous distention. Lungs are clear. Respiratory effort is nonlabored. Cardiac exam reveals S1 and S2. There no clicks or significant murmurs. Abdomen is soft. There is no peripheral edema.  Filed Vitals:   05/27/11 1430  BP: 130/80  Pulse: 58  Height: 5\' 9"  (1.753 m)  Weight: 187 lb 6.4 oz (85.004 kg)    EKG  EKG is not done today. As outlined above I have reviewed completely his event recorder.  ASSESSMENT & PLAN

## 2011-05-31 ENCOUNTER — Other Ambulatory Visit: Payer: Self-pay | Admitting: Internal Medicine

## 2011-06-11 DIAGNOSIS — H409 Unspecified glaucoma: Secondary | ICD-10-CM | POA: Diagnosis not present

## 2011-06-11 DIAGNOSIS — H4011X0 Primary open-angle glaucoma, stage unspecified: Secondary | ICD-10-CM | POA: Diagnosis not present

## 2011-06-11 DIAGNOSIS — E119 Type 2 diabetes mellitus without complications: Secondary | ICD-10-CM | POA: Diagnosis not present

## 2011-06-21 ENCOUNTER — Other Ambulatory Visit: Payer: Self-pay | Admitting: Internal Medicine

## 2011-06-21 NOTE — Telephone Encounter (Signed)
Patient is due for TSH 244.9

## 2011-07-12 ENCOUNTER — Other Ambulatory Visit: Payer: Self-pay | Admitting: Internal Medicine

## 2011-07-19 ENCOUNTER — Other Ambulatory Visit: Payer: Self-pay | Admitting: *Deleted

## 2011-07-22 ENCOUNTER — Encounter: Payer: Self-pay | Admitting: Cardiology

## 2011-07-22 ENCOUNTER — Telehealth: Payer: Self-pay | Admitting: Cardiology

## 2011-07-22 ENCOUNTER — Ambulatory Visit (INDEPENDENT_AMBULATORY_CARE_PROVIDER_SITE_OTHER): Payer: Medicare Other | Admitting: Cardiology

## 2011-07-22 VITALS — BP 142/70 | HR 54 | Ht 69.0 in | Wt 183.8 lb

## 2011-07-22 DIAGNOSIS — I498 Other specified cardiac arrhythmias: Secondary | ICD-10-CM | POA: Diagnosis not present

## 2011-07-22 DIAGNOSIS — I2510 Atherosclerotic heart disease of native coronary artery without angina pectoris: Secondary | ICD-10-CM | POA: Diagnosis not present

## 2011-07-22 DIAGNOSIS — R001 Bradycardia, unspecified: Secondary | ICD-10-CM

## 2011-07-22 NOTE — Assessment & Plan Note (Signed)
Coronary disease is stable. No further workup is needed. He is off beta-blockade.

## 2011-07-22 NOTE — Assessment & Plan Note (Signed)
Beta-blockade has been stopped completely. His heart rate is 55. No further changes.

## 2011-07-22 NOTE — Progress Notes (Signed)
HPI Patient is seen today for the followup of coronary artery disease. He's also seen for followup of bradycardia and some dizziness. We had stopped his low dose beta blocker last time. He's doing well. He is not having any chest pain. He's not had any syncope or presyncope.  Allergies  Allergen Reactions  . Atorvastatin     REACTION: myalgias  . Folic Acid     REACTION: Face burns  . Nicardipine Hcl     REACTION: Gingival hypertrophy  . Pravastatin     myalgias    Current Outpatient Prescriptions  Medication Sig Dispense Refill  . acetaminophen (TYLENOL) 650 MG CR tablet Take 650 mg by mouth as needed.        Marland Kitchen amLODipine (NORVASC) 5 MG tablet TAKE ONE TABLET BY MOUTH DAILY  30 tablet  6  . aspirin 325 MG tablet Take 325 mg by mouth daily.        . benazepril (LOTENSIN) 40 MG tablet TAKE 1 TABLET BY MOUTH EVERY DAY  90 tablet  1  . carvedilol (COREG) 3.125 MG tablet Take 3.125 mg by mouth daily.      . Cholecalciferol (VITAMIN D) 1000 UNITS capsule 2 pills once daily except 3 on T, TH, & Sat       . cloNIDine (CATAPRES) 0.1 MG tablet TAKE 1 TABLET BY MOUTH TWO TIMES A DAY  60 tablet  6  . dorzolamide (TRUSOPT) 2 % ophthalmic solution 1 drop both eyes in the evening      . glimepiride (AMARYL) 2 MG tablet TAKE 1/2 TABLET EVERY MORNING  45 tablet  2  . glucose blood (ACCU-CHEK ACTIVE STRIPS) test strip Use one strip daily to test for sugar level 250.00  100 each  3  . Lancets (ACCU-CHEK SOFT TOUCH) lancets Use daily to check sugar level.       Marland Kitchen LUMIGAN 0.03 % ophthalmic solution 1 drop both eyes in the am      . metFORMIN (GLUCOPHAGE) 500 MG tablet TAKE 1 TABLET BY MOUTH TWICE A DAY WITH LARGEST MEALS  180 tablet  0  . pravastatin (PRAVACHOL) 20 MG tablet Take 20 mg by mouth daily.        Marland Kitchen SYNTHROID 25 MCG tablet TAKE 1 TABLET BY MOUTH DAILY (DUE FOR LABS)  30 tablet  0    History   Social History  . Marital Status: Married    Spouse Name: N/A    Number of Children: N/A  .  Years of Education: N/A   Occupational History  . Not on file.   Social History Main Topics  . Smoking status: Former Games developer  . Smokeless tobacco: Not on file   Comment: Quit at age 45  . Alcohol Use: No  . Drug Use: No  . Sexually Active: Not on file   Other Topics Concern  . Not on file   Social History Narrative  . No narrative on file    Family History  Problem Relation Age of Onset  . Diabetes    . Coronary artery disease Father     Past Medical History  Diagnosis Date  . CAD (coronary artery disease)     Bare-metal stent to SVG circumflex 2008 / nuclear March, 2012, scar distal anterior, anterolateral and apical, no ischemia  . Hypertension   . Ejection fraction     45%, echo, March, 2012  . Aortic insufficiency     Moderate, echo, March, 2012  . Mitral regurgitation  Moderate, echo, March, 2012  . Pulmonary hypertension     Echo, March, 2012, 42 mmHg  . Hx of CABG   . Diabetes mellitus   . Dyslipidemia   . Hypothyroidism   . Bradycardia     Sinus bradycardia  . RBBB (right bundle branch block with left anterior fascicular block)     New July, 2010  . Leg cramps     Nighttime  . Shingles     Severe pain to right flank treated  . Dizziness     Mild, August, 2011  . Ischemia     .Marland KitchenMarland KitchenEF 42%  . Hyperlipidemia   . History of colonoscopy 2005  . Dizziness     December, 2012    Past Surgical History  Procedure Date  . Coronary artery bypass graft 1996    5 vessels  . Back surgery 1999    LS sx- 1999  . Spinal fusion 2000    scar tissue, plate insertion/fusion @ [facility] spine - 2000    ROS  Patient denies fever, chills, headache, sweats, rash, change in vision, change in hearing, chest pain, cough, nausea vomiting, urinary symptoms. All other systems are reviewed and are negative.  PHYSICAL EXAM Patient's oriented to person time and place. Affect is normal. There is no jugulovenous distention. Lungs are clear. Respiratory effort is nonlabored.  He's here with his wife today. The cardiac exam reveals S1 and S2. There no clicks. There is a soft systolic murmur. The abdomen is soft. There is no peripheral edema.  Filed Vitals:   07/22/11 1128  BP: 142/70  Pulse: 54  Height: 5\' 9"  (1.753 m)  Weight: 183 lb 12.8 oz (83.371 kg)   EKG is done today and reviewed by me. There is sinus bradycardia with a rate of 54. There is old right bundle branch block. There is no significant change.  ASSESSMENT & PLAN

## 2011-07-22 NOTE — Patient Instructions (Signed)
Your physician recommends that you schedule a follow-up appointment in: 3 months.  

## 2011-07-22 NOTE — Telephone Encounter (Signed)
Pt's wife calling to give you info re meds

## 2011-07-23 NOTE — Telephone Encounter (Signed)
Mrs Eckels states that she has not been giving Mr Runkle the amlodipine but has been giving him the carvedilol.  She states she was mixed up as to which medication she was supposed to stop.  Per Dr Myrtis Ser, Mr Bartus is to take the amlodipine and stop the carvedilol.  Mrs Shean states she understands.

## 2011-08-03 ENCOUNTER — Other Ambulatory Visit: Payer: Self-pay

## 2011-08-03 ENCOUNTER — Telehealth: Payer: Self-pay | Admitting: Internal Medicine

## 2011-08-03 ENCOUNTER — Encounter (HOSPITAL_COMMUNITY): Payer: Self-pay | Admitting: *Deleted

## 2011-08-03 ENCOUNTER — Inpatient Hospital Stay (HOSPITAL_COMMUNITY)
Admission: EM | Admit: 2011-08-03 | Discharge: 2011-08-11 | DRG: 250 | Disposition: A | Discharge status: Home / Self Care | Payer: Medicare Other | Source: Ambulatory Visit | Attending: Cardiology | Admitting: Cardiology

## 2011-08-03 ENCOUNTER — Ambulatory Visit (HOSPITAL_COMMUNITY): Admit: 2011-08-03 | Payer: Self-pay | Admitting: Cardiology

## 2011-08-03 ENCOUNTER — Emergency Department (HOSPITAL_COMMUNITY): Payer: Medicare Other

## 2011-08-03 ENCOUNTER — Encounter (HOSPITAL_COMMUNITY)
Admission: EM | Disposition: A | Discharge status: Home / Self Care | Payer: Self-pay | Source: Ambulatory Visit | Attending: Cardiology

## 2011-08-03 DIAGNOSIS — K5900 Constipation, unspecified: Secondary | ICD-10-CM | POA: Diagnosis present

## 2011-08-03 DIAGNOSIS — Z9861 Coronary angioplasty status: Secondary | ICD-10-CM

## 2011-08-03 DIAGNOSIS — E039 Hypothyroidism, unspecified: Secondary | ICD-10-CM | POA: Diagnosis present

## 2011-08-03 DIAGNOSIS — E876 Hypokalemia: Secondary | ICD-10-CM | POA: Diagnosis not present

## 2011-08-03 DIAGNOSIS — R0602 Shortness of breath: Secondary | ICD-10-CM | POA: Diagnosis not present

## 2011-08-03 DIAGNOSIS — F05 Delirium due to known physiological condition: Secondary | ICD-10-CM | POA: Diagnosis not present

## 2011-08-03 DIAGNOSIS — Z794 Long term (current) use of insulin: Secondary | ICD-10-CM

## 2011-08-03 DIAGNOSIS — I5023 Acute on chronic systolic (congestive) heart failure: Secondary | ICD-10-CM | POA: Diagnosis not present

## 2011-08-03 DIAGNOSIS — I25810 Atherosclerosis of coronary artery bypass graft(s) without angina pectoris: Secondary | ICD-10-CM | POA: Diagnosis present

## 2011-08-03 DIAGNOSIS — R7401 Elevation of levels of liver transaminase levels: Secondary | ICD-10-CM | POA: Diagnosis not present

## 2011-08-03 DIAGNOSIS — E860 Dehydration: Secondary | ICD-10-CM | POA: Diagnosis not present

## 2011-08-03 DIAGNOSIS — I452 Bifascicular block: Secondary | ICD-10-CM | POA: Diagnosis present

## 2011-08-03 DIAGNOSIS — T82897A Other specified complication of cardiac prosthetic devices, implants and grafts, initial encounter: Secondary | ICD-10-CM | POA: Diagnosis not present

## 2011-08-03 DIAGNOSIS — I4510 Unspecified right bundle-branch block: Secondary | ICD-10-CM | POA: Diagnosis present

## 2011-08-03 DIAGNOSIS — Z87891 Personal history of nicotine dependence: Secondary | ICD-10-CM | POA: Diagnosis not present

## 2011-08-03 DIAGNOSIS — J9819 Other pulmonary collapse: Secondary | ICD-10-CM | POA: Diagnosis not present

## 2011-08-03 DIAGNOSIS — I509 Heart failure, unspecified: Secondary | ICD-10-CM | POA: Diagnosis present

## 2011-08-03 DIAGNOSIS — I10 Essential (primary) hypertension: Secondary | ICD-10-CM | POA: Diagnosis present

## 2011-08-03 DIAGNOSIS — N289 Disorder of kidney and ureter, unspecified: Secondary | ICD-10-CM | POA: Diagnosis present

## 2011-08-03 DIAGNOSIS — R7989 Other specified abnormal findings of blood chemistry: Secondary | ICD-10-CM | POA: Diagnosis not present

## 2011-08-03 DIAGNOSIS — I2129 ST elevation (STEMI) myocardial infarction involving other sites: Principal | ICD-10-CM | POA: Diagnosis present

## 2011-08-03 DIAGNOSIS — R7309 Other abnormal glucose: Secondary | ICD-10-CM | POA: Diagnosis not present

## 2011-08-03 DIAGNOSIS — R0989 Other specified symptoms and signs involving the circulatory and respiratory systems: Secondary | ICD-10-CM | POA: Diagnosis not present

## 2011-08-03 DIAGNOSIS — I359 Nonrheumatic aortic valve disorder, unspecified: Secondary | ICD-10-CM | POA: Diagnosis present

## 2011-08-03 DIAGNOSIS — I213 ST elevation (STEMI) myocardial infarction of unspecified site: Secondary | ICD-10-CM

## 2011-08-03 DIAGNOSIS — R918 Other nonspecific abnormal finding of lung field: Secondary | ICD-10-CM | POA: Diagnosis not present

## 2011-08-03 DIAGNOSIS — E1351 Other specified diabetes mellitus with diabetic peripheral angiopathy without gangrene: Secondary | ICD-10-CM | POA: Diagnosis present

## 2011-08-03 DIAGNOSIS — R001 Bradycardia, unspecified: Secondary | ICD-10-CM | POA: Insufficient documentation

## 2011-08-03 DIAGNOSIS — I219 Acute myocardial infarction, unspecified: Secondary | ICD-10-CM | POA: Diagnosis not present

## 2011-08-03 DIAGNOSIS — I498 Other specified cardiac arrhythmias: Secondary | ICD-10-CM | POA: Diagnosis not present

## 2011-08-03 DIAGNOSIS — E872 Acidosis: Secondary | ICD-10-CM | POA: Diagnosis not present

## 2011-08-03 DIAGNOSIS — R079 Chest pain, unspecified: Secondary | ICD-10-CM | POA: Diagnosis not present

## 2011-08-03 DIAGNOSIS — R5381 Other malaise: Secondary | ICD-10-CM | POA: Diagnosis not present

## 2011-08-03 DIAGNOSIS — I517 Cardiomegaly: Secondary | ICD-10-CM | POA: Diagnosis not present

## 2011-08-03 DIAGNOSIS — E119 Type 2 diabetes mellitus without complications: Secondary | ICD-10-CM | POA: Diagnosis present

## 2011-08-03 DIAGNOSIS — I2510 Atherosclerotic heart disease of native coronary artery without angina pectoris: Secondary | ICD-10-CM

## 2011-08-03 DIAGNOSIS — IMO0001 Reserved for inherently not codable concepts without codable children: Secondary | ICD-10-CM | POA: Diagnosis not present

## 2011-08-03 DIAGNOSIS — Y849 Medical procedure, unspecified as the cause of abnormal reaction of the patient, or of later complication, without mention of misadventure at the time of the procedure: Secondary | ICD-10-CM | POA: Diagnosis present

## 2011-08-03 DIAGNOSIS — R945 Abnormal results of liver function studies: Secondary | ICD-10-CM

## 2011-08-03 DIAGNOSIS — R739 Hyperglycemia, unspecified: Secondary | ICD-10-CM

## 2011-08-03 HISTORY — PX: LEFT HEART CATHETERIZATION WITH CORONARY ANGIOGRAM: SHX5451

## 2011-08-03 LAB — POCT I-STAT, CHEM 8
Calcium, Ion: 1.25 mmol/L (ref 1.12–1.32)
Chloride: 110 mEq/L (ref 96–112)
Creatinine, Ser: 1.6 mg/dL — ABNORMAL HIGH (ref 0.50–1.35)
Glucose, Bld: 366 mg/dL — ABNORMAL HIGH (ref 70–99)
HCT: 44 % (ref 39.0–52.0)
Hemoglobin: 15 g/dL (ref 13.0–17.0)

## 2011-08-03 LAB — COMPREHENSIVE METABOLIC PANEL
ALT: 113 U/L — ABNORMAL HIGH (ref 0–53)
AST: 417 U/L — ABNORMAL HIGH (ref 0–37)
Alkaline Phosphatase: 67 U/L (ref 39–117)
CO2: 17 mEq/L — ABNORMAL LOW (ref 19–32)
GFR calc Af Amer: 34 mL/min — ABNORMAL LOW (ref >90)
GFR calc non Af Amer: 29 mL/min — ABNORMAL LOW (ref >90)
Glucose, Bld: 400 mg/dL — ABNORMAL HIGH (ref 70–99)
Potassium: 5.3 mEq/L — ABNORMAL HIGH (ref 3.5–5.1)
Sodium: 134 mEq/L — ABNORMAL LOW (ref 135–145)

## 2011-08-03 LAB — DIFFERENTIAL
Basophils Absolute: 0 10*3/uL (ref 0.0–0.1)
Lymphocytes Relative: 11 % — ABNORMAL LOW (ref 12–46)
Lymphs Abs: 1.8 10*3/uL (ref 0.7–4.0)
Neutro Abs: 14.1 10*3/uL — ABNORMAL HIGH (ref 1.7–7.7)
Neutrophils Relative %: 83 % — ABNORMAL HIGH (ref 43–77)

## 2011-08-03 LAB — MRSA PCR SCREENING: MRSA by PCR: NEGATIVE

## 2011-08-03 LAB — GLUCOSE, CAPILLARY
Glucose-Capillary: 390 mg/dL — ABNORMAL HIGH (ref 70–99)
Glucose-Capillary: 323 mg/dL — ABNORMAL HIGH (ref 70–99)

## 2011-08-03 LAB — CBC
HCT: 40 % (ref 39.0–52.0)
MCV: 89 fL (ref 78.0–100.0)
MCV: 87.9 fL (ref 78.0–100.0)
Platelets: 221 10*3/uL (ref 150–400)
Platelets: 214 10*3/uL (ref 150–400)
RBC: 4.71 MIL/uL (ref 4.22–5.81)
RBC: 4.55 MIL/uL (ref 4.22–5.81)
RDW: 14.4 % (ref 11.5–15.5)
WBC: 17 10*3/uL — ABNORMAL HIGH (ref 4.0–10.5)
WBC: 18.9 10*3/uL — ABNORMAL HIGH (ref 4.0–10.5)

## 2011-08-03 LAB — BASIC METABOLIC PANEL
CO2: 13 mEq/L — ABNORMAL LOW (ref 19–32)
Calcium: 9.2 mg/dL (ref 8.4–10.5)
Creatinine, Ser: 1.67 mg/dL — ABNORMAL HIGH (ref 0.50–1.35)
GFR calc non Af Amer: 36 mL/min — ABNORMAL LOW (ref >90)
Glucose, Bld: 313 mg/dL — ABNORMAL HIGH (ref 70–99)

## 2011-08-03 LAB — CARDIAC PANEL(CRET KIN+CKTOT+MB+TROPI)
Relative Index: 4.4 — ABNORMAL HIGH (ref 0.0–2.5)
Total CK: 5370 U/L — ABNORMAL HIGH (ref 7–232)
Troponin I: >25 ng/mL (ref <0.30)

## 2011-08-03 LAB — POCT I-STAT TROPONIN I

## 2011-08-03 LAB — CREATININE, SERUM
GFR calc Af Amer: 42 mL/min — ABNORMAL LOW (ref >90)
GFR calc non Af Amer: 36 mL/min — ABNORMAL LOW (ref >90)

## 2011-08-03 LAB — PROTIME-INR: INR: 2.5 — ABNORMAL HIGH (ref 0.00–1.49)

## 2011-08-03 SURGERY — LEFT HEART CATHETERIZATION WITH CORONARY ANGIOGRAM
Anesthesia: LOCAL

## 2011-08-03 MED ORDER — ASPIRIN 81 MG PO CHEW
CHEWABLE_TABLET | ORAL | Status: AC
  Filled 2011-08-03: qty 4

## 2011-08-03 MED ORDER — FENTANYL CITRATE 0.05 MG/ML IJ SOLN
INTRAMUSCULAR | Status: AC
  Filled 2011-08-03: qty 0.5

## 2011-08-03 MED ORDER — LIDOCAINE HCL (PF) 1 % IJ SOLN
INTRAMUSCULAR | Status: AC
  Filled 2011-08-03: qty 30

## 2011-08-03 MED ORDER — BENAZEPRIL HCL 40 MG PO TABS
40.0000 mg | ORAL_TABLET | Freq: Every day | ORAL | Status: DC
  Filled 2011-08-03: qty 1

## 2011-08-03 MED ORDER — INSULIN ASPART 100 UNIT/ML ~~LOC~~ SOLN
0.0000 [IU] | Freq: Every day | SUBCUTANEOUS | Status: DC
  Administered 2011-08-03: 4 [IU] via SUBCUTANEOUS

## 2011-08-03 MED ORDER — ONDANSETRON HCL 4 MG/2ML IJ SOLN: 4.0000 mg | Freq: Four times a day (QID) | INTRAMUSCULAR | Status: DC | PRN

## 2011-08-03 MED ORDER — NITROGLYCERIN IN D5W 200-5 MCG/ML-% IV SOLN
INTRAVENOUS | Status: AC
  Filled 2011-08-03: qty 250

## 2011-08-03 MED ORDER — INSULIN ASPART 100 UNIT/ML ~~LOC~~ SOLN
0.0000 [IU] | Freq: Three times a day (TID) | SUBCUTANEOUS | Status: DC
  Administered 2011-08-04: 11 [IU] via SUBCUTANEOUS
  Administered 2011-08-04: 5 [IU] via SUBCUTANEOUS
  Administered 2011-08-04: 11 [IU] via SUBCUTANEOUS
  Administered 2011-08-05: 8 [IU] via SUBCUTANEOUS
  Administered 2011-08-05 (×2): 5 [IU] via SUBCUTANEOUS
  Administered 2011-08-06: 2 [IU] via SUBCUTANEOUS
  Administered 2011-08-06: 5 [IU] via SUBCUTANEOUS
  Administered 2011-08-06: 11 [IU] via SUBCUTANEOUS
  Administered 2011-08-07 (×2): 3 [IU] via SUBCUTANEOUS
  Administered 2011-08-07: 8 [IU] via SUBCUTANEOUS
  Administered 2011-08-08 (×2): 5 [IU] via SUBCUTANEOUS
  Administered 2011-08-08: 3 [IU] via SUBCUTANEOUS
  Administered 2011-08-09: 2 [IU] via SUBCUTANEOUS
  Administered 2011-08-09: 5 [IU] via SUBCUTANEOUS

## 2011-08-03 MED ORDER — ONDANSETRON HCL 4 MG/2ML IJ SOLN
INTRAMUSCULAR | Status: AC
  Administered 2011-08-03: 4 mg via INTRAVENOUS
  Filled 2011-08-03: qty 2

## 2011-08-03 MED ORDER — ROSUVASTATIN CALCIUM 40 MG PO TABS
40.0000 mg | ORAL_TABLET | Freq: Every day | ORAL | Status: DC
  Administered 2011-08-03: 40 mg via ORAL
  Filled 2011-08-03 (×2): qty 1

## 2011-08-03 MED ORDER — AMLODIPINE BESYLATE 5 MG PO TABS
5.0000 mg | ORAL_TABLET | Freq: Every day | ORAL | Status: DC
  Administered 2011-08-04 – 2011-08-06 (×3): 5 mg via ORAL
  Filled 2011-08-03 (×4): qty 1

## 2011-08-03 MED ORDER — ACETAMINOPHEN 325 MG PO TABS
650.0000 mg | ORAL_TABLET | ORAL | Status: DC | PRN
  Administered 2011-08-04: 650 mg via ORAL
  Filled 2011-08-03: qty 2

## 2011-08-03 MED ORDER — SODIUM CHLORIDE 0.9 % IV SOLN
INTRAVENOUS | Status: DC
  Administered 2011-08-03: 13:00:00 via INTRAVENOUS

## 2011-08-03 MED ORDER — ACETAMINOPHEN 325 MG PO TABS: 650.0000 mg | ORAL_TABLET | ORAL | Status: DC | PRN

## 2011-08-03 MED ORDER — ASPIRIN EC 81 MG PO TBEC: 81.0000 mg | DELAYED_RELEASE_TABLET | Freq: Every day | ORAL | Status: DC

## 2011-08-03 MED ORDER — MORPHINE SULFATE 4 MG/ML IJ SOLN
1.0000 mg | INTRAMUSCULAR | Status: DC | PRN
  Administered 2011-08-03: 2 mg via INTRAVENOUS
  Administered 2011-08-05 – 2011-08-08 (×4): 1 mg via INTRAVENOUS
  Filled 2011-08-03 (×5): qty 1

## 2011-08-03 MED ORDER — ZOLPIDEM TARTRATE 5 MG PO TABS: 5.0000 mg | ORAL_TABLET | Freq: Every evening | ORAL | Status: DC | PRN

## 2011-08-03 MED ORDER — ONDANSETRON HCL 4 MG/2ML IJ SOLN
4.0000 mg | Freq: Four times a day (QID) | INTRAMUSCULAR | Status: DC | PRN
  Administered 2011-08-03 – 2011-08-04 (×2): 4 mg via INTRAVENOUS
  Filled 2011-08-03 (×2): qty 2

## 2011-08-03 MED ORDER — ACETAMINOPHEN ER 650 MG PO TBCR: 650.0000 mg | EXTENDED_RELEASE_TABLET | ORAL | Status: DC | PRN

## 2011-08-03 MED ORDER — HEPARIN (PORCINE) IN NACL 2-0.9 UNIT/ML-% IJ SOLN
INTRAMUSCULAR | Status: AC
  Filled 2011-08-03: qty 2000

## 2011-08-03 MED ORDER — DORZOLAMIDE HCL 2 % OP SOLN
1.0000 [drp] | Freq: Every day | OPHTHALMIC | Status: DC
  Administered 2011-08-03 – 2011-08-10 (×8): 1 [drp] via OPHTHALMIC
  Filled 2011-08-03: qty 10

## 2011-08-03 MED ORDER — CLOPIDOGREL BISULFATE 75 MG PO TABS
75.0000 mg | ORAL_TABLET | Freq: Every day | ORAL | Status: DC
  Administered 2011-08-04 – 2011-08-11 (×8): 75 mg via ORAL
  Filled 2011-08-03 (×10): qty 1

## 2011-08-03 MED ORDER — BIVALIRUDIN 250 MG IV SOLR
INTRAVENOUS | Status: AC
  Filled 2011-08-03: qty 250

## 2011-08-03 MED ORDER — MORPHINE SULFATE 4 MG/ML IJ SOLN
INTRAMUSCULAR | Status: AC
  Filled 2011-08-03: qty 1

## 2011-08-03 MED ORDER — CLOPIDOGREL BISULFATE 300 MG PO TABS
ORAL_TABLET | ORAL | Status: AC
  Filled 2011-08-03: qty 2

## 2011-08-03 MED ORDER — MIDAZOLAM HCL 2 MG/2ML IJ SOLN
INTRAMUSCULAR | Status: AC
  Filled 2011-08-03: qty 1

## 2011-08-03 MED ORDER — HYDRALAZINE HCL 20 MG/ML IJ SOLN
10.0000 mg | Freq: Once | INTRAMUSCULAR | Status: AC
  Administered 2011-08-03: 10 mg via INTRAVENOUS

## 2011-08-03 MED ORDER — BIMATOPROST 0.03 % OP SOLN
1.0000 [drp] | Freq: Every day | OPHTHALMIC | Status: DC
  Filled 2011-08-03: qty 2.5

## 2011-08-03 MED ORDER — SODIUM CHLORIDE 0.9 % IV SOLN
0.2500 mg/kg/h | INTRAVENOUS | Status: DC
  Filled 2011-08-03: qty 250

## 2011-08-03 MED ORDER — SODIUM CHLORIDE 0.9 % IJ SOLN
3.0000 mL | Freq: Two times a day (BID) | INTRAMUSCULAR | Status: DC
  Administered 2011-08-05 – 2011-08-11 (×13): 3 mL via INTRAVENOUS

## 2011-08-03 MED ORDER — FAMOTIDINE IN NACL 20-0.9 MG/50ML-% IV SOLN
INTRAVENOUS | Status: AC
  Filled 2011-08-03: qty 50

## 2011-08-03 MED ORDER — NITROGLYCERIN 0.2 MG/ML ON CALL CATH LAB
INTRAVENOUS | Status: AC
  Filled 2011-08-03: qty 1

## 2011-08-03 MED ORDER — BIMATOPROST 0.01 % OP SOLN
1.0000 [drp] | Freq: Every day | OPHTHALMIC | Status: DC
  Administered 2011-08-04 – 2011-08-11 (×8): 1 [drp] via OPHTHALMIC
  Filled 2011-08-03: qty 2.5

## 2011-08-03 MED ORDER — SODIUM CHLORIDE 0.9 % IV SOLN: 250.0000 mL | INTRAVENOUS | Status: DC | PRN

## 2011-08-03 MED ORDER — CARVEDILOL 3.125 MG PO TABS: 3.1250 mg | ORAL_TABLET | Freq: Every day | ORAL | Status: DC

## 2011-08-03 MED ORDER — LEVOTHYROXINE SODIUM 25 MCG PO TABS
25.0000 ug | ORAL_TABLET | Freq: Every day | ORAL | Status: DC
  Administered 2011-08-04 – 2011-08-11 (×8): 25 ug via ORAL
  Filled 2011-08-03 (×10): qty 1

## 2011-08-03 MED ORDER — NITROGLYCERIN 0.4 MG SL SUBL
0.4000 mg | SUBLINGUAL_TABLET | SUBLINGUAL | Status: DC | PRN
  Administered 2011-08-07: 0.4 mg via SUBLINGUAL
  Filled 2011-08-03: qty 25

## 2011-08-03 MED ORDER — ASPIRIN EC 81 MG PO TBEC
81.0000 mg | DELAYED_RELEASE_TABLET | Freq: Every day | ORAL | Status: DC
  Administered 2011-08-04 – 2011-08-11 (×8): 81 mg via ORAL
  Filled 2011-08-03 (×8): qty 1

## 2011-08-03 MED ORDER — SODIUM CHLORIDE 0.9 % IJ SOLN: 3.0000 mL | INTRAMUSCULAR | Status: DC | PRN

## 2011-08-03 MED ORDER — HEPARIN (PORCINE) IN NACL 100-0.45 UNIT/ML-% IJ SOLN
INTRAMUSCULAR | Status: AC
  Filled 2011-08-03: qty 250

## 2011-08-03 MED ORDER — FAMOTIDINE IN NACL 20-0.9 MG/50ML-% IV SOLN: 20.0000 mg | Freq: Once | INTRAVENOUS | Status: DC

## 2011-08-03 MED ORDER — NON FORMULARY: 40.0000 mg | Freq: Every day | Status: DC

## 2011-08-03 MED ORDER — SODIUM CHLORIDE 0.9 % IV SOLN
INTRAVENOUS | Status: AC
  Administered 2011-08-03: 19:00:00 via INTRAVENOUS

## 2011-08-03 MED ORDER — ALPRAZOLAM 0.25 MG PO TABS
0.2500 mg | ORAL_TABLET | Freq: Two times a day (BID) | ORAL | Status: DC | PRN
  Administered 2011-08-04: 0.25 mg via ORAL
  Filled 2011-08-03: qty 1

## 2011-08-03 MED ORDER — ENOXAPARIN SODIUM 40 MG/0.4ML ~~LOC~~ SOLN
40.0000 mg | SUBCUTANEOUS | Status: DC
  Filled 2011-08-03: qty 0.4

## 2011-08-03 MED ORDER — VITAMIN D3 25 MCG (1000 UNIT) PO TABS
2000.0000 [IU] | ORAL_TABLET | Freq: Every day | ORAL | Status: DC
  Administered 2011-08-04 – 2011-08-11 (×8): 2000 [IU] via ORAL
  Filled 2011-08-03 (×8): qty 2

## 2011-08-03 MED ORDER — HYDRALAZINE HCL 20 MG/ML IJ SOLN
INTRAMUSCULAR | Status: AC
  Administered 2011-08-03: 10 mg via INTRAVENOUS
  Filled 2011-08-03: qty 1

## 2011-08-03 MED ORDER — ATROPINE SULFATE 1 MG/ML IJ SOLN
INTRAMUSCULAR | Status: AC
  Filled 2011-08-03: qty 1

## 2011-08-03 NOTE — Telephone Encounter (Signed)
Caller: Edith/Spouse; PCP: Marga Melnick; CB#: [phone]; ; ; Call regarding Vomiting and Diarrhea; Onset 08/02/11; states ate supper at [employer], and about 30 min after returning home, began vomiting.  Afebrile.   Has vomited so hard his muscles ache in his abdomen.  Has not been able to keep down much in the way of fluids, and is very dizzy when he tries to walk.  Per protocol, advised ED; states will take to [facility] ED.

## 2011-08-03 NOTE — Progress Notes (Signed)
Manual pressure held to right groin after initial hold by Dr. Riley Kill. Hemostasis achieved. Guaze pressure dressing applied. Pt educated on site care and movement, voiced understanding. Right groin Level 0. Right pedal pulse 3+. Pt had small emesis toward end of hold. Medicated with zofran 4mg  IV, otherwise pt tolerated well. Will continue to monitor.

## 2011-08-03 NOTE — Telephone Encounter (Signed)
[facility] Triage Call Report Triage Record Num: [id number] Operator: Chevis Pretty Patient Name: [name] Call Date & Time: 08/03/2011 11:19:58AM Patient Phone: [phone] PCP: Marga Melnick Patient Gender: Male PCP Fax : [phone] Patient DOB: [birth year] Practice Name: [facility] Reason for Call: Caller: Edith/Spouse; PCP: Marga Melnick; CB#: [phone]; ; ; Call regarding Vomiting and Diarrhea; Onset 08/02/11; states ate supper at [employer], and about 30 min after returning home, began vomiting. Afebrile. Has vomited so hard his muscles ache in his abdomen. Has not been able to keep down much in the way of fluids, and is very dizzy when he tries to walk. Per protocol, advised ED; states will take to [facility] ED. Protocol(s) Used: Nausea or Vomiting Recommended Outcome per Protocol: See ED Immediately Reason for Outcome: Signs of dehydration Care Advice: ~ Protect the patient from falling or other harm. ~ Another adult should drive. Call EMS 911 if signs and symptoms of shock develop (such as unable to stand due to faintness, dizziness, or lightheadedness; new onset of confusion; slow to respond or difficult to awaken; skin is pale, gray, cool, or moist to touch; severe weakness; loss of consciousness). ~ ~ IMMEDIATE ACTION Write down provider's name. List or place the following in a bag for transport with the patient: current prescription and/or nonprescription medications; alternative treatments, therapies and medications; and street drugs. ~ May have clear liquids (such as water, clear fruit juices without pulp, soda, tea or coffee without dairy or non-dairy creamer, clear broth or bouillon, oral hydration solution, or plain gelatin, fruit ices/popsicles, hard candy) but do not eat solid foods before being seen by your provider. ~

## 2011-08-03 NOTE — Telephone Encounter (Signed)
Patient seen in ED today.

## 2011-08-03 NOTE — ED Notes (Signed)
Pt c/o chest tightness and vomiting since 6pm last night

## 2011-08-03 NOTE — ED Notes (Signed)
NVD since 6 pm. Chest pain,since 6p, seems to be associated with vomiting episodes.

## 2011-08-03 NOTE — ED Notes (Signed)
Called [facility] , Dr Steward Drone , Dr Rubin Payor at [facility]

## 2011-08-03 NOTE — Progress Notes (Signed)
CRITICAL VALUE ALERT  Critical value received:  ckmb 233.9, Troponin >25.0  Date of notification:  08/03/10  Time of notification:  2020  Critical value read back:yes  Nurse who received alert:  Madalyn Rob   MD notified (1st page):  Dr. Riley Kill  Time of first page:  2020  MD notified (2nd page):  Time of second page:  Responding MD:  Dr. Riley Kill  Time MD responded:  2020

## 2011-08-03 NOTE — ED Notes (Signed)
[facility] is to start the heparin and nitroglycerin enroute to cath lab.

## 2011-08-03 NOTE — H&P (Signed)
History and Physical  Patient ID: [name] Patient ID: [name] MRN: [id number], DOB/AGE: 23-Apr-1926 78 y.o. Date of Encounter: 08/03/2011  Primary Physician: Randy Melnick, MD, MD Primary Cardiologist: JK  Chief Complaint:  Chest pain  HPI: [name] is an 78 year old male with a history of coronary artery disease. He has regular episodes of angina that have been treated medically. Yesterday [name] was having problems with diarrhea. He also had some nausea and vomiting. He did not note any blood or black tarry stools and there was no hematemesis. At approximately 9 PM he had onset of severe substernal chest pain. It reached an 8/10. It was associated with shortness of breath in addition to the nausea and vomiting. He had no diaphoresis. He had pain all might long. When his symptoms did not resolve he went to [facility] emergency room. In the emergency reviewed room he received normal saline IV as well as heparin and IV nitroglycerin. His ECG was significantly abnormal in a new way. He was transferred emergently to [facility] cone and taken directly to the cath lab. His ECG is still abnormal and he is having ongoing 7/10 chest pain.  Past Medical History  Diagnosis Date  . CAD (coronary artery disease)     Bare-metal stent to SVG circumflex 2008 / nuclear March, 2012, scar distal anterior, anterolateral and apical, no ischemia  . Hypertension   . Ejection fraction     45%, echo, March, 2012  . Aortic insufficiency     Moderate, echo, March, 2012  . Mitral regurgitation     Moderate, echo, March, 2012  . Pulmonary hypertension     Echo, March, 2012, 42 mmHg  . Hx of CABG   . Diabetes mellitus   . Dyslipidemia   . Hypothyroidism   . Bradycardia     Sinus bradycardia  . RBBB (right bundle branch block with left anterior fascicular block)     New July, 2010  . Leg cramps     Nighttime  . Shingles     Severe pain to right flank treated  . Dizziness     Mild,  August, 2011  . Ischemia     .Marland KitchenMarland KitchenEF 42%  . Hyperlipidemia   . History of colonoscopy 2005  . Dizziness     December, 2012     Surgical History:  Past Surgical History  Procedure Date  . Coronary artery bypass graft 1996    5 vessels  . Back surgery 1999    LS sx- 1999  . Spinal fusion 2000    scar tissue, plate insertion/fusion @ [facility] spine - 2000  . Cardiac surgery     I have reviewed the patient's meds.  Current facility-administered medications: 0.9 %  sodium chloride infusion,Last Rate: 125 mL/hr at 08/03/11 1257;   heparin 100-0.45 UNIT/ML-% infusion, , , , ;   nitroGLYCERIN 0.2 mg/mL in dextrose 5 % infusion, , , , ;  Current outpatient prescriptions: acetaminophen (TYLENOL) 650 MG CR tablet, Take 650 mg by mouth as needed. amLODipine (NORVASC) 5 MG tablet, Take 5 mg by mouth daily;   aspirin 325 MG tablet, Take 325 mg by mouth daily;   benazepril (LOTENSIN) 40 MG tablet, Take 40 mg by mouth daily. carvedilol (COREG) 3.125 MG tablet, Take 3.125 mg by mouth daily. D/c'd because of bradycardia cholecalciferol (VITAMIN D) 1000 UNITS tablet, Take 2,000 Units by mouth daily ;   cloNIDine (CATAPRES) 0.1 MG tablet, Take 0.1 mg by mouth 2 (  two) times daily.;   glimepiride (AMARYL) 2 MG tablet, Take 1 mg by mouth daily before breakfast.;   levothyroxine (SYNTHROID, LEVOTHROID) 25 MCG tablet, Take 25 mcg by mouth daily.,   metFORMIN (GLUCOPHAGE) 500 MG tablet, Take 500 mg by mouth 2 (two) times daily with a meal., ;  dorzolamide (TRUSOPT) 2 % ophthalmic solution, 1 drop both eyes in the evening,    LUMIGAN 0.03 % ophthalmic solution, 1 drop both eyes in the am,  Allergies:  Allergies  Allergen Reactions  . Atorvastatin     REACTION: myalgias  . Folic Acid     REACTION: Face burns  . Nicardipine Hcl     REACTION: Gingival hypertrophy  . Pravastatin     myalgias    History   Social History  . Marital Status: Married    Spouse Name: N/A    Number of Children: N/A  .  Years of Education: N/A   Occupational History  . Not on file.   Social History Main Topics  . Smoking status: Former Games developer  . Smokeless tobacco: Not on file   Comment: Quit at age 83  . Alcohol Use: No  . Drug Use: No  . Sexually Active: Not on file   Other Topics Concern  . Not on file   Social History Narrative  .  The patient is a traveling Warehouse manager.  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     Family History  Problem Relation Age of Onset  . Diabetes    . Coronary artery disease Father     Review of Systems: [name] had nausea, vomiting and diarrhea yesterday. He had chills but did not check his temperature. He has chronic musculoskeletal aches and pains. He denies melena or hematemesis. He has not had any recent illnesses. He has had multiple episodes of chest pain, treated medically. They all resolved until the episode today which was his usual angina. Full 14-point review of systems otherwise negative except as noted above.   Physical Exam: Blood pressure 147/73, pulse 38, temperature 97.8 F (36.6 C), temperature source Oral, resp. rate 20, height 5\' 9"  (1.753 m), weight 183 lb (83.008 kg), SpO2 98.00%. General: Well developed, well nourished, elderly male in acute distress. Head: Normocephalic, atraumatic, sclera non-icteric, no xanthomas, nares are without discharge. Dentition: poor Neck: carotid bruits. JVD not elevated. No thyromegally Lungs: Good expansion bilaterally, Clear bilaterally to auscultation without wheezes or rhonchi. Few Rales in the bases Heart: Regular rate and rhythm with S1 S2. No S3 or S4.  No significant murmurs, no rubs, or gallops appreciated. Abdomen: Soft, non-tender, non-distended with normoactive bowel sounds. No hepatomegaly. No rebound/guarding. No obvious abdominal masses. Msk:  Strength and tone appear normal for age. No joint deformities or effusions, no spine or costo-vertebral angle  tenderness. Extremities: No clubbing or cyanosis. No edema.  Distal pedal pulses are 2+ and equal bilaterally. Neuro: Alert and oriented X 3. Moves all extremities spontaneously. No focal deficits noted. Psych:  Responds to questions appropriately with a normal affect. Skin: No rashes or lesions noted  Labs:   Lab Results  Component Value Date   WBC 17.0* 08/03/2011   HGB 15.0 08/03/2011   HCT 44.0 08/03/2011   MCV 89.0 08/03/2011   PLT 221 08/03/2011   No results found for this basename: INR in the last 72 hours  Lab 08/03/11 1320  NA 137  K 5.6*  CL 110  CO2 --  BUN  41*  CREATININE 1.60*  CALCIUM --  PROT --  BILITOT --  ALKPHOS --  ALT --  AST --  GLUCOSE 366*   POC Troponin: 47.97  Radiology/Studies:  CXR pending  EKG: 03-Aug-2011 12:07:34  Sinus rhythm Right bundle branch block Possible Lateral infarct , age undetermined Abnormal ECG When compared with ECG of 12-Mar-2007 08:01, Current undetermined rhythm precludes rhythm comparison, needs review Right bundle branch block has replaced Incomplete right bundle branch block Borderline criteria for Lateral infarct are now Present Vent. rate 56 BPM PR interval * ms QRS duration 162 ms QT/QTc 514/496 ms P-R-T axes 77 269 59   ASSESSMENT AND PLAN:  Principal Problem:  *Acute MI, initial - Mr. Eckels is being taken directly to the Cath Lab. Further evaluation and treatment will depend on the results of the cardiac catheterization. We will check an echo as well. He is on ASA and we will add a statin. His HR is low, in the 40s at times so the Coreg is on hold for now. It will be restarted if his HR/BP will tolerate it.  Active Problems:  DM w/o Complication Type II - We will hold his metformin, continue the Amarly, use sliding scale insulin, and check a hemoglobin A1c.  RBBB (right bundle branch block with left anterior fascicular block) - follow  HTN - His BP is normal to high but his HR is low, dropping into the  40s at times. We will hold the clonidine and the Coreg for now. We will restart the Coreg if his HR will tolerate it and may have to increase the Norvasc if he needs more BP control.  Renal insufficiency - his BUN and creatinine are slightly higher than they were 6 months ago. However, he has a recent GI illness and may be dehydrated. His EF was previously slightly low. We will hydrate him and follow his renal function and volume status closely.  Signed,  Bjorn Loser Wesson Stith PA-C 08/03/2011, 1:33 PM

## 2011-08-03 NOTE — Progress Notes (Signed)
Patient is pain free.  Enzymes significantly elevated as expected, probably reflecting infarct onset last pm.  No chest pain at present.  I held groin myself for approximately twenty minutes with good hemostasis.  Nursing staff now holding.  Hydralazine given for elevated BP.  Shawnie Pons 8:13 PM 08/03/2011

## 2011-08-03 NOTE — Progress Notes (Signed)
Chaplain responded to a Code STEMI page. Chaplain greeted family and escorted them to a consultation room. Chaplain offered emotional support; family said that they were coping okay. Chaplain served as Print production planner between Programme researcher, broadcasting/film/video and family. Follow up as needed.

## 2011-08-03 NOTE — ED Notes (Signed)
Nitro and heparin pulled from pyxis for pt and transported with [facility], [facility] staff advised that they would start both medications en route to [facility].

## 2011-08-03 NOTE — CV Procedure (Addendum)
Cardiac Catheterization Procedure Note  Name: [name] MRN: [id number] DOB: 1926-03-13  Procedure:Placement of catheters without left hear cath, Selective Coronary Angiography, SVG and LIMA angiography, PTCA and aspiration thrombectomy of occluded SVG  Indication:   The patient is 78 years old and had CABG 17 years ago.  In 2008, he had stenting of the SVG to OM ostium which was successful.  He developed nausea and vomiting after dinner last pm, then chest pain and did not come to the ER last night, but then came this am.  He was still having pain about ten hours later.  ECG suggested posterior MI and he was transferred for urgent intervention.    Diagnostic Procedure Details: The right groin was prepped, draped, and anesthetized with 1% lidocaine. Using the modified Seldinger technique, a 5 French sheath was introduced into the right femoral artery.  We knew from the prior procedure that the RCA was occluded as was the diagonal SVG .  His creatinine was mildly elevated, so we tried to conserve on contrast.  Catheter exchanges were performed over a wire.  The diagnostic procedure was well-tolerated without immediate complications.  Following the diagnostic procedure the old films were reviewed, and this had to be done in the viewing room as films cannot be seen in the cath labs.  We obtained a repeat ECG which showed persistent ST depression, although his pain was better.   The ramus was patent, and the graft to the OM3 was occluded, but it was not clear it was fresh.  We elected to give him bivalirudin, then probe the graft, with deferral of the ramus lesion for a later date.  An AL-1 amplatz guide was utilized.  The ACT was therapeutic.  We were able to get a wire to the mid graft, and several balloon inflations and aspiration thrombectomy was performed without much return of flow.  We then attempt to get a wire down farther in the graft, and were successful navigating through likely a tight area  in the mid graft.  This was then dilated and we were able to see present but suboptimal flow in the distal vessel down to the OM3 and PDA territories.  He was pain free by this time.  We then used larger balloons and multiple passes with aspiration and were able to establish a small amount of flow distally.  Because he was pain free, the CR was elevated, we abandoned further attempts to open the graft further was this likely would have required extensive thrombectomy, distal protection, several stents without a clear benefit despite the risk involved.  The amount of residual thrombus was extensive, appeared partially older.   He was stable at this point, and given his presentation time which became more clear as the case progressed we elected a more conservative course at this point.  I reviewed in detail with the family the current findings.    PROCEDURAL FINDINGS Hemodynamics:  AO 117/69 (88) LV not done  Coronary angiography: Coronary dominance: right  Left mainstem: Calcified but no significant obstruction.   Left anterior descending (LAD): 70 proximal, then 80% leading into first septal, then total occlusion.  The LIMA to the LAD is widely patent filling the LAD to the apex  Left circumflex (LCx): Provides a large ramus with 90% proximal narrowing, progressed from the last study.  It then bifurcates, and the larger branch has about 50% segmental narrowing.  The AV circumflex is occluded.  The SVG to the OM 3 has been  previously stented at the ostium in 2008.  The stent is now occluded.  After multiple dilatations and thrombectomy, there is extensive thrombus throughout the stent and proximal graft.  There was a high grade lesion in the mid to distal graft which was identified by its difficulty to cross, and dogboning of the balloon with inflation at the lesion.  This was not well seen, but opened with a 3.0 balloon.  After multiple dilatations, and thrombectomy, there remained extensive  thrombus throughout the stent and distally, and TIMI 1 flow into the distal graft.  Some contrast was seen in the distal circumflex.  See procedural notes for further details.    Right coronary artery (RCA): was not injected but was known to be occluded.    Left ventriculography: the LV was not injected.  PCI Data: Vessel - SVG to OM3/Segment - 22 Percent Stenosis (pre)  100 TIMI-flow 0 Stent none deployed Percent Stenosis (post) 80 TIMI-flow (post) 1  Final Conclusions:   1.  MI secondary to extensive graft occlusion with late presentation. 2.  Occluded SVG to the OM3 with suboptimal reperfusion secondary to extensive clotting. 3.  Prior CABG with LIMA to the LAD, SVG to diagonal  (known prior occlusion), SVG to OM3 and distally with prior ostial stenting. 4.  Preserved LV in past  -  No LV secondary to elevated creatinine.  Recommendations:  1.  Probable elective PCI of ramus intermedius when patient stabilizes.    Shawnie Pons 5:05 PM 08/03/2011   Shawnie Pons 08/03/2011, 4:39 PM

## 2011-08-03 NOTE — ED Provider Notes (Signed)
History     CSN: [id number]  Arrival date & time 08/03/11  1148   First MD Initiated Contact with Patient 08/03/11 1215      Chief Complaint  Patient presents with  . Emesis    (Consider location/radiation/quality/duration/timing/severity/associated sxs/prior Treatment)  HPI [name] is a 78 y.o. male who presents to the Emergency Department complaining of constant moderate chest pain after dinner last night with associated mild SOB and light headedness. Patient also complains of severe nausea, vomiting, and diarrhea onset after dinner last night, but markedly improved. Patient states nausea and vomiting are aggravated with eating. Patient with h/o of multiple bypass surgery at age 47, and cardiac stent four years ago. Patient also with history HTN, Aortic insufficiency, Mitral regurgitation, Diabetes, Dyslipidemia.      Past Medical History  Diagnosis Date  . CAD (coronary artery disease)     Bare-metal stent to SVG circumflex 2008 / nuclear March, 2012, scar distal anterior, anterolateral and apical, no ischemia  . Hypertension   . Ejection fraction     45%, echo, March, 2012  . Aortic insufficiency     Moderate, echo, March, 2012  . Mitral regurgitation     Moderate, echo, March, 2012  . Pulmonary hypertension     Echo, March, 2012, 42 mmHg  . Hx of CABG   . Diabetes mellitus   . Dyslipidemia   . Hypothyroidism   . Bradycardia     Sinus bradycardia  . RBBB (right bundle branch block with left anterior fascicular block)     New July, 2010  . Leg cramps     Nighttime  . Shingles     Severe pain to right flank treated  . Dizziness     Mild, August, 2011  . Ischemia     .Marland KitchenMarland KitchenEF 42%  . Hyperlipidemia   . History of colonoscopy 2005  . Dizziness     December, 2012    Past Surgical History  Procedure Date  . Coronary artery bypass graft 1996    5 vessels  . Back surgery 1999    [facility] sx- 1999  . Spinal fusion 2000    scar tissue, plate insertion/fusion @  [facility] - 2000  . Cardiac surgery     Family History  Problem Relation Age of Onset  . Diabetes    . Coronary artery disease Father     History  Substance Use Topics  . Smoking status: Former Games developer  . Smokeless tobacco: Not on file   Comment: Quit at age 23  . Alcohol Use: No      Review of Systems 10 Systems reviewed and are negative for acute change except as noted in the HPI. Allergies  Atorvastatin; Folic acid; Nicardipine hcl; and Pravastatin  Home Medications   Current Outpatient Rx  Name Route Sig Dispense Refill  . ACETAMINOPHEN ER 650 MG PO TBCR Oral Take 650 mg by mouth as needed. Pain    . AMLODIPINE BESYLATE 5 MG PO TABS Oral Take 5 mg by mouth daily.    . ASPIRIN 325 MG PO TABS Oral Take 325 mg by mouth daily.      Marland Kitchen BENAZEPRIL HCL 40 MG PO TABS Oral Take 40 mg by mouth daily.    Marland Kitchen CARVEDILOL 3.125 MG PO TABS Oral Take 3.125 mg by mouth daily.    Marland Kitchen VITAMIN D 1000 UNITS PO TABS Oral Take 2,000 Units by mouth daily.    Marland Kitchen CLONIDINE HCL 0.1 MG PO TABS  Oral Take 0.1 mg by mouth 2 (two) times daily.    Marland Kitchen GLIMEPIRIDE 2 MG PO TABS Oral Take 1 mg by mouth daily before breakfast.    . LEVOTHYROXINE SODIUM 25 MCG PO TABS Oral Take 25 mcg by mouth daily.    Marland Kitchen METFORMIN HCL 500 MG PO TABS Oral Take 500 mg by mouth 2 (two) times daily with a meal.    . DORZOLAMIDE HCL 2 % OP SOLN  1 drop both eyes in the evening    . LUMIGAN 0.03 % OP SOLN  1 drop both eyes in the am      BP 147/73  Pulse 38  Temp(Src) 97.8 F (36.6 C) (Oral)  Resp 20  Ht 5\' 9"  (1.753 m)  Wt 183 lb (83.008 kg)  BMI 27.02 kg/m2  SpO2 98%  Physical Exam  Nursing note and vitals reviewed. Constitutional: He is oriented to person, place, and time. He appears well-developed and well-nourished. No distress.  HENT:  Head: Normocephalic and atraumatic.  Eyes: EOM are normal. Pupils are equal, round, and reactive to light.  Neck: Neck supple. No tracheal deviation present.  Cardiovascular: Normal  rate and regular rhythm.  Exam reveals no gallop and no friction rub.   No murmur heard. Pulmonary/Chest: Effort normal and breath sounds normal. No respiratory distress. He has no wheezes. He has no rales. He exhibits no tenderness.       Midline sternal scar.   Abdominal: Soft. He exhibits no distension. There is no tenderness.  Musculoskeletal: Normal range of motion. He exhibits no edema.  Neurological: He is alert and oriented to person, place, and time. No sensory deficit.       Light headed and dizzy   Skin: Skin is warm and dry.  Psychiatric: He has a normal mood and affect. His behavior is normal.    ED Course  Procedures (including critical care time) DIAGNOSTIC STUDIES: Oxygen Saturation is 98% on room air, normal by my interpretation.    COORDINATION OF CARE: 12:19AM- Patient informed of current plan for treatment and evaluation and agrees with plan at this time.     Labs Reviewed  GLUCOSE, CAPILLARY - Abnormal; Notable for the following:    Glucose-Capillary 390 (*)    All other components within normal limits  CBC - Abnormal; Notable for the following:    WBC 17.0 (*)    All other components within normal limits  DIFFERENTIAL - Abnormal; Notable for the following:    Neutrophils Relative 83 (*)    Neutro Abs 14.1 (*)    Lymphocytes Relative 11 (*)    Monocytes Absolute 1.1 (*)    All other components within normal limits  COMPREHENSIVE METABOLIC PANEL  LIPASE, BLOOD   No results found.   1. STEMI (ST elevation myocardial infarction)   2. Hyperglycemia     Date: 08/03/2011  Rate: 56  Rhythm: sinus bradycardia  QRS Axis: normal  Intervals: normal  ST/T Wave abnormalities: ST elevations inferiorly and ST depressions anteriorly  Conduction Disutrbances:right bundle branch block  Narrative Interpretation:   Old EKG Reviewed: changes noted     MDM  Patient presented with nausea vomiting with diarrhea last night. States it started after eating at [employer]. He states that last night at about 6 PM he developed some chest pressure. He states it does appear to be associated with the vomiting. The nausea vomiting and diarrhea this has improved, but the pressure has maintained. He states it does not feel  like his previous heart attacks. EKG was done and shows a worsening right bundle branch block there is some ST depression anteriorly in the precordial leads. There is possible ST elevation in aVF. Patient initial complaint was nausea vomiting with diarrhea. He says that had improved. EKG with was reviewed by myself and Dr. Dietrich Pates. The decision was made to call a code STEMI. Patient will be transferred down to [facility]. I discussed with Dr. Riley Kill who will see the patient. Patients are taken aspirin today and heparin will be started. Patient has been having pain for 13 hours.      I personally performed the services described in this documentation, which was scribed in my presence. The recorded information has been reviewed and considered.     Juliet Rude. Rubin Payor, MD 08/03/11 1321

## 2011-08-04 ENCOUNTER — Inpatient Hospital Stay (HOSPITAL_COMMUNITY): Payer: Medicare Other

## 2011-08-04 ENCOUNTER — Other Ambulatory Visit: Payer: Self-pay

## 2011-08-04 DIAGNOSIS — I219 Acute myocardial infarction, unspecified: Secondary | ICD-10-CM

## 2011-08-04 DIAGNOSIS — R5381 Other malaise: Secondary | ICD-10-CM | POA: Diagnosis not present

## 2011-08-04 DIAGNOSIS — I5023 Acute on chronic systolic (congestive) heart failure: Secondary | ICD-10-CM | POA: Diagnosis not present

## 2011-08-04 DIAGNOSIS — R918 Other nonspecific abnormal finding of lung field: Secondary | ICD-10-CM | POA: Diagnosis not present

## 2011-08-04 DIAGNOSIS — T82897A Other specified complication of cardiac prosthetic devices, implants and grafts, initial encounter: Secondary | ICD-10-CM | POA: Diagnosis not present

## 2011-08-04 DIAGNOSIS — J9819 Other pulmonary collapse: Secondary | ICD-10-CM | POA: Diagnosis not present

## 2011-08-04 DIAGNOSIS — I25810 Atherosclerosis of coronary artery bypass graft(s) without angina pectoris: Secondary | ICD-10-CM | POA: Diagnosis not present

## 2011-08-04 DIAGNOSIS — I2129 ST elevation (STEMI) myocardial infarction involving other sites: Secondary | ICD-10-CM | POA: Diagnosis not present

## 2011-08-04 DIAGNOSIS — I359 Nonrheumatic aortic valve disorder, unspecified: Secondary | ICD-10-CM

## 2011-08-04 LAB — CBC
HCT: 36.8 % — ABNORMAL LOW (ref 39.0–52.0)
Hemoglobin: 12.3 g/dL — ABNORMAL LOW (ref 13.0–17.0)
MCH: 30 pg (ref 26.0–34.0)
MCHC: 33.4 g/dL (ref 30.0–36.0)
MCV: 89.8 fL (ref 78.0–100.0)
RBC: 4.1 MIL/uL — ABNORMAL LOW (ref 4.22–5.81)

## 2011-08-04 LAB — POCT I-STAT, CHEM 8
BUN: 37 mg/dL — ABNORMAL HIGH (ref 6–23)
Calcium, Ion: 1.25 mmol/L (ref 1.12–1.32)
Chloride: 112 mEq/L (ref 96–112)
HCT: 42 % (ref 39.0–52.0)
Potassium: 4.7 mEq/L (ref 3.5–5.1)
Sodium: 138 mEq/L (ref 135–145)

## 2011-08-04 LAB — LIPID PANEL
LDL Cholesterol: 97 mg/dL (ref 0–99)
VLDL: 28 mg/dL (ref 0–40)

## 2011-08-04 LAB — LACTIC ACID, PLASMA: Lactic Acid, Venous: 2.6 mmol/L — ABNORMAL HIGH (ref 0.5–2.2)

## 2011-08-04 LAB — CARDIAC PANEL(CRET KIN+CKTOT+MB+TROPI)
Relative Index: 4.2 — ABNORMAL HIGH (ref 0.0–2.5)
Relative Index: 3.3 — ABNORMAL HIGH (ref 0.0–2.5)
Total CK: 4729 U/L — ABNORMAL HIGH (ref 7–232)
Troponin I: >25 ng/mL (ref <0.30)
Troponin I: >25 ng/mL (ref <0.30)

## 2011-08-04 LAB — BASIC METABOLIC PANEL
CO2: 18 mEq/L — ABNORMAL LOW (ref 19–32)
Calcium: 8.8 mg/dL (ref 8.4–10.5)
GFR calc non Af Amer: 25 mL/min — ABNORMAL LOW (ref >90)
Potassium: 4.3 mEq/L (ref 3.5–5.1)
Sodium: 137 mEq/L (ref 135–145)

## 2011-08-04 LAB — COMPREHENSIVE METABOLIC PANEL
ALT: 298 U/L — ABNORMAL HIGH (ref 0–53)
AST: 601 U/L — ABNORMAL HIGH (ref 0–37)
Alkaline Phosphatase: 56 U/L (ref 39–117)
Calcium: 8.9 mg/dL (ref 8.4–10.5)
Glucose, Bld: 309 mg/dL — ABNORMAL HIGH (ref 70–99)
Potassium: 4.7 mEq/L (ref 3.5–5.1)
Sodium: 136 mEq/L (ref 135–145)
Total Protein: 6.4 g/dL (ref 6.0–8.3)

## 2011-08-04 LAB — GLUCOSE, CAPILLARY
Glucose-Capillary: 295 mg/dL — ABNORMAL HIGH (ref 70–99)
Glucose-Capillary: 317 mg/dL — ABNORMAL HIGH (ref 70–99)
Glucose-Capillary: 212 mg/dL — ABNORMAL HIGH (ref 70–99)
Glucose-Capillary: 155 mg/dL — ABNORMAL HIGH (ref 70–99)

## 2011-08-04 LAB — POCT ACTIVATED CLOTTING TIME: Activated Clotting Time: 551 seconds

## 2011-08-04 LAB — KETONES, QUALITATIVE: Acetone, Bld: NEGATIVE

## 2011-08-04 MED ORDER — SODIUM CHLORIDE 0.9 % IV SOLN
INTRAVENOUS | Status: DC
  Administered 2011-08-04: 22:00:00 via INTRAVENOUS

## 2011-08-04 MED ORDER — ENOXAPARIN SODIUM 30 MG/0.3ML ~~LOC~~ SOLN
30.0000 mg | SUBCUTANEOUS | Status: DC
  Administered 2011-08-04 – 2011-08-07 (×4): 30 mg via SUBCUTANEOUS
  Filled 2011-08-04 (×4): qty 0.3

## 2011-08-04 MED ORDER — SODIUM CHLORIDE 0.9 % IV SOLN
INTRAVENOUS | Status: AC
  Administered 2011-08-04: 11:00:00 via INTRAVENOUS

## 2011-08-04 MED ORDER — SODIUM CHLORIDE 0.45 % IV SOLN
INTRAVENOUS | Status: DC
  Administered 2011-08-04: via INTRAVENOUS

## 2011-08-04 MED ORDER — MORPHINE SULFATE 2 MG/ML IJ SOLN: 2.0000 mg | Freq: Once | INTRAMUSCULAR | Status: DC

## 2011-08-04 MED ORDER — NITROGLYCERIN IN D5W 200-5 MCG/ML-% IV SOLN: 2.0000 ug/min | INTRAVENOUS | Status: DC

## 2011-08-04 MED ORDER — FUROSEMIDE 10 MG/ML IJ SOLN
40.0000 mg | Freq: Once | INTRAMUSCULAR | Status: AC
  Administered 2011-08-04: 40 mg via INTRAVENOUS
  Filled 2011-08-04: qty 4

## 2011-08-04 MED FILL — Dextrose Inj 5%: INTRAVENOUS | Qty: 50 | Status: AC

## 2011-08-04 NOTE — Progress Notes (Signed)
Inpatient Diabetes Program Recommendations  AACE/ADA: New Consensus Statement on Inpatient Glycemic Control (2009)  Target Ranges:  Prepandial:   less than 140 mg/dL      Peak postprandial:   less than 180 mg/dL (1-2 hours)      Critically ill patients:  140 - 180 mg/dL   Reason for assessment:  Hyperglycemia  Results for [name], [name] (MRN [id number]) as of 08/04/2011 10:59  Ref. Range 08/03/2011 11:59 08/03/2011 22:36 08/04/2011 07:53  Glucose-Capillary Latest Range: 70-99 mg/dL 409 (H) 811 (H) 914 (H)    Inpatient Diabetes Program Recommendations Insulin - Basal: Start Lantus 15 units   Thank you  Piedad Climes RN,BSN,CDE Inpatient Diabetes Coordinator

## 2011-08-04 NOTE — Progress Notes (Signed)
Patient Name: [name] Date of Encounter: 08/04/2011     Principal Problem:  *Acute MI, initial Active Problems:  DM w/o Complication Type II  CAD (coronary artery disease)  Hypertension  RBBB (right bundle branch block with left anterior fascicular block)  Renal insufficiency    SUBJECTIVE  Asked to see pt 2/2 restlessness, agitation.  Pt reports mild dyspnea.  RN & family note that he's been restless and somewhat agitated.  Vss.  No chest pain.  CURRENT MEDS    . amLODipine  5 mg Oral Daily  . aspirin EC  81 mg Oral Daily  . bimatoprost  1 drop Both Eyes Daily  . bivalirudin      . cholecalciferol  2,000 Units Oral Daily  . clopidogrel      . clopidogrel  75 mg Oral Q breakfast  . dorzolamide  1 drop Both Eyes QHS  . enoxaparin  30 mg Subcutaneous Q24H  . fentaNYL      . furosemide  40 mg Intravenous Once  . heparin      . heparin      . hydrALAZINE  10 mg Intravenous Once  . insulin aspart  0-15 Units Subcutaneous TID WC  . insulin aspart  0-5 Units Subcutaneous QHS  . levothyroxine  25 mcg Oral Q breakfast    OBJECTIVE  Filed Vitals:   08/04/11 0900 08/04/11 1100 08/04/11 1143 08/04/11 1400  BP: 144/76 143/75  142/72  Pulse: 79 59  60  Temp:   99 F (37.2 C)   TempSrc:   Oral   Resp: 22 20  24   Height:      Weight:      SpO2: 95% 95%  65%    Intake/Output Summary (Last 24 hours) at 08/04/11 1511 Last data filed at 08/04/11 1400  Gross per 24 hour  Intake 2038.85 ml  Output    100 ml  Net 1938.85 ml   Filed Weights   08/03/11 1151 08/03/11 2000 08/04/11 0400  Weight: 183 lb (83.008 kg) 179 lb 3.7 oz (81.3 kg) 179 lb 3.7 oz (81.3 kg)    PHYSICAL EXAM  General: Pleasant, NAD. Neuro: Alert and oriented X 3. Moves all extremities spontaneously.  Follows commands.   Psych: Flat affect. HEENT:  Normal  Neck: Supple, JVP to jaw. Lungs:  Resp regular.  No use of accessory muscles but RR up slightly.  bilat crackles 1/3 way up. Heart:  RRR no s3, s4.  2/6 sm @ apex.. Abdomen: semi-firm, non-tender, BS + x 4.  Extremities: No clubbing, cyanosis or edema. DP/PT/Radials 2+ and equal bilaterally.  Accessory Clinical Findings  CBC  Basename 08/04/11 0630 08/03/11 1901 08/03/11 1244  WBC 18.0* 18.9* --  NEUTROABS -- -- 14.1*  HGB 12.3* 13.6 --  HCT 36.8* 40.0 --  MCV 89.8 87.9 --  PLT 191 214 --   Basic Metabolic Panel  Basename 08/04/11 0630 08/03/11 1901  NA 136 135  K 4.7 5.4*  CL 105 103  CO2 15* 13*  GLUCOSE 309* 313*  BUN 47* 38*  CREATININE 2.15* 1.68*1.67*  CALCIUM 8.9 9.2  MG -- 2.4  PHOS -- --   Liver Function Tests  Basename 08/04/11 0630 08/03/11 1244  AST 601* 417*  ALT 298* 113*  ALKPHOS 56 67  BILITOT 0.6 0.7  PROT 6.4 7.6  ALBUMIN 3.6 4.1    Basename 08/03/11 1244  LIPASE 28  AMYLASE --   Cardiac Enzymes  Basename 08/04/11 0630 08/04/11  0032 08/03/11 1901  CKTOTAL 4335* 4729* 5370*  CKMB 141.9* 196.7* 233.9*  CKMBINDEX -- -- --  TROPONINI >25.00* >25.00* >25.00*   Fasting Lipid Panel  Basename 08/04/11 0630  CHOL 158  HDL 33*  LDLCALC 97  TRIG 161  CHOLHDL 4.8  LDLDIRECT --   TELE  Sinus, av dissociation - stable from earlier  Radiology/Studies  Dg Chest Port 1 View  08/04/2011  *RADIOLOGY REPORT*  Clinical Data: 78 year old male with weakness, confusion.  PORTABLE CHEST - 1 VIEW  Comparison: 08/03/2011 and earlier.  Findings: Portable semi upright AP view 0943 hours. Stable cardiomegaly and mediastinal contours.  Increased pulmonary vascular congestion.  No pneumothorax or large effusion.  Increased streaky lung base opacity greater on the left.  No consolidation.  IMPRESSION: Increased vascular congestion and atelectasis.  Original Report Authenticated By: Harley Hallmark, M.D.    ASSESSMENT AND PLAN  1.  Acute dyspnea:  Pt mildly tachypneic and reports dyspnea.  CXR this am showed vascular congestion and he has crackles ~ 1/3 way up bilat.  Neck veins are  elevated.  D/c IV fluid.  Will give one dose of IV lasix, 40mg  and reassess later.  F/u labs and cxr in am.  Check echo.  2.  Acute MI/CAD:  Pending intervention.  No chest pain currently.  Check ecg.  3.  Acute on chronic renal failure:  Follow closely in setting of recent contrast and now iv diuresis.  Signed, Nicolasa Ducking NP

## 2011-08-04 NOTE — Progress Notes (Signed)
Received order. Will hold ambulation for now with late presenting MI unless o/w specified. Ethelda Chick CES, ACSM

## 2011-08-04 NOTE — Progress Notes (Signed)
UR Completed.  Merle Cirelli Jane [phone] 08/04/2011  

## 2011-08-04 NOTE — Progress Notes (Signed)
Subjective:  Patient stable, but got confused at 4 am, got out of bed.  No chest pain.  Feels better in general.    Objective:  Vital Signs in the last 24 hours: Temp:  [97.8 F (36.6 C)-98.7 F (37.1 C)] 98.7 F (37.1 C) (03/20 0752) Pulse Rate:  [38-68] 68  (03/20 0752) Resp:  [17-23] 23  (03/20 0700) BP: (105-159)/(55-88) 143/72 mmHg (03/20 0700) SpO2:  [88 %-100 %] 95 % (03/20 0700) Arterial Line BP: (154-187)/(75-88) 174/79 mmHg (03/19 1950) Weight:  [179 lb 3.7 oz (81.3 kg)-183 lb (83.008 kg)] 179 lb 3.7 oz (81.3 kg) (03/20 0400)  Intake/Output from previous day: 03/19 0701 - 03/20 0700 In: 897.4 [P.O.:120; I.V.:775.4; IV Piggyback:2] Out: 100 [Urine:100]   Physical Exam: General: Well developed, well nourished, in no acute distress. Head:  Normocephalic and atraumatic. Lungs: Clear to auscultation and percussion. Heart: Normal S1 and S2.  No murmur, rubs or gallops.  Pulses: Pulses normal in all 4 extremities.  Groin site looks ok.   Extremities: No clubbing or cyanosis. No edema. Neurologic: Alert and oriented x 3.    Lab Results:  Basename 08/04/11 0630 08/03/11 1901  WBC 18.0* 18.9*  HGB 12.3* 13.6  PLT 191 214    Basename 08/04/11 0630 08/03/11 1901  NA 136 135  K 4.7 5.4*  CL 105 103  CO2 15* 13*  GLUCOSE 309* 313*  BUN 47* 38*  CREATININE 2.15* 1.68*1.67*    Basename 08/04/11 0630 08/04/11 0032  TROPONINI >25.00* >25.00*   Hepatic Function Panel  Basename 08/04/11 0630  PROT 6.4  ALBUMIN 3.6  AST 601*  ALT 298*  ALKPHOS 56  BILITOT 0.6  BILIDIR --  IBILI --    Basename 08/04/11 0630  CHOL 158   No results found for this basename: PROTIME in the last 72 hours  Imaging: Dg Chest Port 1 View  08/03/2011  *RADIOLOGY REPORT*  Clinical Data: Chest pain.  Shortness of breath.  PORTABLE CHEST - 1 VIEW  Comparison: 07/07/2009  Findings: Cardiomegaly and prior CABG noted.  Apical lordotic angulation was employed.  The lungs appear clear.   There is mild atherosclerotic calcification of the aortic arch.  IMPRESSION:  1.  Stable cardiomegaly.  The lungs appear clear. 2.  Atherosclerosis.  Original Report Authenticated By: Dellia Cloud, M.D.    EKG:  SB with some AV dissociation.  Wide QRS of RBBB  (baseline), so likely some junctional escape beats.  Leftward axis.      Assessment/Plan:  Patient Active Hospital Problem List: Acute MI, initial (08/03/2011)   Assessment: Delayed presentation with posterior MI secondary to graft occlusion.  Patient waited about twelve hours or more to come in but with continued pain.   Plan: monitor Hypertension    Assessment: currently ok   Plan: Current meds include beta blockers and clonidine on hold.  Junctional rhythm.  Will continue to hold.   Renal insufficiency (08/03/2011)   Assessment: baseline with some contrast induced nephropathy.    Plan: gentle hydration, hold ACE inhibitor, monitor.  Patient informed of issues. AV block   Hold clonidine and beta blockers Elevated LFTs   SGOT expected, but in light of both will hold crestor for now.         Shawnie Pons, MD, [facility], FSCAI 08/04/2011, 8:22 AM

## 2011-08-04 NOTE — Progress Notes (Signed)
Pt very agitated tying to get out of bed. Pulling at lines. Short of breath. Confused. Dr Riley Kill notified

## 2011-08-04 NOTE — Progress Notes (Signed)
  Echocardiogram 2D Echocardiogram has been performed.  Mercy Moore 08/04/2011, 5:58 PM

## 2011-08-04 NOTE — Progress Notes (Signed)
Pt trying to get OOB without assist, starts c/o chest pressure. Nitro drip turned back on at 28mcg/min. Pt starting to have relief of chest pain. Will monitor.

## 2011-08-04 NOTE — Progress Notes (Addendum)
PM note  Patient has been somewhat confused during the day.  VS are stable at present.  He remains somewhat confused. BP 129/70  P59 R 18 Relatively clear lung fields. Soft apical murmur Groin stable 2D echo reviewed but official report pending---posterior wall akinetic. EF estimate by my eye---45%  Rhythm stable although sometimes Wenkebach and sometimes junctional.  BP stable.   Labs consistent increased Cr secondary to renal insufficiency with additional component of some contrast induced nephropathy. Glucoses are gradually coming down with round the clock insulin.  CO2 low but slightly higher.  Will check urinalysis, serum ketone and lactate (AG-16), continue to watch. Taper off of NTG to keep perfusion pressures up. He was on metformin as an outpatient but it has not been restarted.     I have called for  general medicine to see the patient in consult to assist with his diabetes management.    Shawnie Pons 8:10 PM\08/04/2011

## 2011-08-04 NOTE — Progress Notes (Signed)
Pt pulled condom cath off. Pt confused.

## 2011-08-05 DIAGNOSIS — I5023 Acute on chronic systolic (congestive) heart failure: Secondary | ICD-10-CM | POA: Diagnosis not present

## 2011-08-05 DIAGNOSIS — R7989 Other specified abnormal findings of blood chemistry: Secondary | ICD-10-CM | POA: Diagnosis not present

## 2011-08-05 DIAGNOSIS — I2129 ST elevation (STEMI) myocardial infarction involving other sites: Secondary | ICD-10-CM | POA: Diagnosis not present

## 2011-08-05 DIAGNOSIS — E872 Acidosis: Secondary | ICD-10-CM | POA: Diagnosis not present

## 2011-08-05 DIAGNOSIS — I25810 Atherosclerosis of coronary artery bypass graft(s) without angina pectoris: Secondary | ICD-10-CM | POA: Diagnosis not present

## 2011-08-05 DIAGNOSIS — I219 Acute myocardial infarction, unspecified: Secondary | ICD-10-CM | POA: Diagnosis not present

## 2011-08-05 DIAGNOSIS — T82897A Other specified complication of cardiac prosthetic devices, implants and grafts, initial encounter: Secondary | ICD-10-CM | POA: Diagnosis not present

## 2011-08-05 DIAGNOSIS — E860 Dehydration: Secondary | ICD-10-CM | POA: Diagnosis not present

## 2011-08-05 LAB — CBC
Hemoglobin: 11.9 g/dL — ABNORMAL LOW (ref 13.0–17.0)
MCH: 30.1 pg (ref 26.0–34.0)
Platelets: 163 10*3/uL (ref 150–400)
RBC: 3.95 MIL/uL — ABNORMAL LOW (ref 4.22–5.81)
WBC: 15.8 10*3/uL — ABNORMAL HIGH (ref 4.0–10.5)

## 2011-08-05 LAB — BASIC METABOLIC PANEL
CO2: 18 mEq/L — ABNORMAL LOW (ref 19–32)
Calcium: 8.6 mg/dL (ref 8.4–10.5)
Glucose, Bld: 200 mg/dL — ABNORMAL HIGH (ref 70–99)
Potassium: 4.8 mEq/L (ref 3.5–5.1)
Sodium: 136 mEq/L (ref 135–145)

## 2011-08-05 LAB — URINALYSIS, ROUTINE W REFLEX MICROSCOPIC
Ketones, ur: NEGATIVE mg/dL
Nitrite: NEGATIVE
Protein, ur: 30 mg/dL — AB
Urobilinogen, UA: 0.2 mg/dL (ref 0.0–1.0)

## 2011-08-05 LAB — URINE MICROSCOPIC-ADD ON

## 2011-08-05 LAB — GLUCOSE, CAPILLARY
Glucose-Capillary: 233 mg/dL — ABNORMAL HIGH (ref 70–99)
Glucose-Capillary: 239 mg/dL — ABNORMAL HIGH (ref 70–99)
Glucose-Capillary: 197 mg/dL — ABNORMAL HIGH (ref 70–99)

## 2011-08-05 MED ORDER — ENSURE PUDDING PO PUDG
1.0000 | Freq: Every day | ORAL | Status: DC
  Administered 2011-08-05: 1 via ORAL

## 2011-08-05 MED ORDER — GLUCERNA SHAKE PO LIQD: 237.0000 mL | Freq: Once | ORAL | Status: DC

## 2011-08-05 MED ORDER — FUROSEMIDE 10 MG/ML IJ SOLN
20.0000 mg | Freq: Once | INTRAMUSCULAR | Status: AC
  Administered 2011-08-05: 20 mg via INTRAVENOUS
  Filled 2011-08-05: qty 2

## 2011-08-05 MED ORDER — NEPRO/CARBSTEADY PO LIQD
237.0000 mL | Freq: Three times a day (TID) | ORAL | Status: DC | PRN
  Filled 2011-08-05 (×3): qty 237

## 2011-08-05 MED ORDER — GLUCERNA 1.2 CAL PO LIQD: 1000.0000 mL | Freq: Once | ORAL | Status: DC

## 2011-08-05 MED ORDER — INSULIN GLARGINE 100 UNIT/ML ~~LOC~~ SOLN
10.0000 [IU] | Freq: Every day | SUBCUTANEOUS | Status: DC
  Administered 2011-08-05 – 2011-08-07 (×3): 10 [IU] via SUBCUTANEOUS

## 2011-08-05 NOTE — Progress Notes (Signed)
RN sts pt gets very DOE and has CP upon activity. Will continue to hold ambulation.  Ethelda Chick CES, ACSm

## 2011-08-05 NOTE — Consult Note (Signed)
PCP:   Marga Melnick, MD, MD   Reason for the consult:   Management of diabetes  Requesting physician: Dr. Riley Kill   HPI: [name] is an 78 y.o. male with history of diabetes on metformin and Amaryl, moderate aortic insufficiency, hypertension, hypothyroidism, history of right bundle branch block with left anterior fascicular block, having diarrhea for several days prior to being admitted for an acute MI. He is currently care for by Dr. Riley Kill for his MI. He was noted to have elevated blood glucose, along with developing normochloremic metabolic acidosis. His metformin was promptly discontinued prior to his cardiac catheterization. He was given moderate insulin sliding scale, and we were asked to help manage his diabetes. He had some episodes of confusion in the ICU as well.  Rewiew of Systems:  No further diarrhea, abdominal pain or chest pain.  No SOB.   Past Medical History  Diagnosis Date  . CAD (coronary artery disease)     Bare-metal stent to SVG circumflex 2008 / nuclear March, 2012, scar distal anterior, anterolateral and apical, no ischemia  . Hypertension   . Ejection fraction     45%, echo, March, 2012  . Aortic insufficiency     Moderate, echo, March, 2012  . Mitral regurgitation     Moderate, echo, March, 2012  . Pulmonary hypertension     Echo, March, 2012, 42 mmHg  . Hx of CABG   . Diabetes mellitus   . Dyslipidemia   . Hypothyroidism   . Bradycardia     Sinus bradycardia  . RBBB (right bundle branch block with left anterior fascicular block)     New July, 2010  . Leg cramps     Nighttime  . Shingles     Severe pain to right flank treated  . Dizziness     Mild, August, 2011  . Ischemia     .Marland KitchenMarland KitchenEF 42%  . Hyperlipidemia   . History of colonoscopy 2005  . Dizziness     December, 2012    Past Surgical History  Procedure Date  . Coronary artery bypass graft 1996    5 vessels  . Back surgery 1999    LS sx- 1999  . Spinal fusion 2000    scar  tissue, plate insertion/fusion @ [facility] spine - 2000  . Cardiac surgery     Medications:  HOME MEDS: Prior to Admission medications   Medication Sig Start Date End Date Taking? Authorizing Provider  acetaminophen (TYLENOL) 650 MG CR tablet Take 650 mg by mouth as needed. Pain   Yes Historical Provider, MD  amLODipine (NORVASC) 5 MG tablet Take 5 mg by mouth daily.   Yes Historical Provider, MD  aspirin 325 MG EC tablet Take 325 mg by mouth daily.   Yes Historical Provider, MD  benazepril (LOTENSIN) 40 MG tablet Take 40 mg by mouth daily. 04/06/11  Yes Pecola Lawless, MD  carvedilol (COREG) 3.125 MG tablet Take 3.125 mg by mouth daily.   Yes Historical Provider, MD  cholecalciferol (VITAMIN D) 1000 UNITS tablet Take 2,000 Units by mouth daily.   Yes Historical Provider, MD  cloNIDine (CATAPRES) 0.1 MG tablet Take 0.1 mg by mouth 2 (two) times daily.   Yes Historical Provider, MD  dorzolamide (TRUSOPT) 2 % ophthalmic solution 1 drop both eyes in the evening 03/14/11  Yes Historical Provider, MD  glimepiride (AMARYL) 2 MG tablet Take 1 mg by mouth daily before breakfast.   Yes Historical Provider, MD  levothyroxine (SYNTHROID, LEVOTHROID)  25 MCG tablet Take 25 mcg by mouth daily.   Yes Historical Provider, MD  LUMIGAN 0.03 % ophthalmic solution 1 drop both eyes in the am 02/26/11  Yes Historical Provider, MD  metFORMIN (GLUCOPHAGE) 500 MG tablet Take 500 mg by mouth 2 (two) times daily with a meal.   Yes Historical Provider, MD     Allergies:  Allergies  Allergen Reactions  . Atorvastatin     REACTION: myalgias  . Folic Acid     REACTION: Face burns  . Nicardipine Hcl     REACTION: Gingival hypertrophy  . Pravastatin     myalgias    Social History:   reports that he has quit smoking. He does not have any smokeless tobacco history on file. He reports that he does not drink alcohol or use illicit drugs.  Family History: Family History  Problem Relation Age of Onset  . Diabetes      . Coronary artery disease Father      Physical Exam: Filed Vitals:   08/05/11 0200 08/05/11 0300 08/05/11 0400 08/05/11 0500  BP: 111/67 129/64 119/65 130/67  Pulse: 56 52 52 54  Temp:   97.8 F (36.6 C)   TempSrc:   Axillary   Resp: 22 24 22 20   Height:      Weight:      SpO2: 97% 98% 97% 97%   Blood pressure 130/67, pulse 54, temperature 97.8 F (36.6 C), temperature source Axillary, resp. rate 20, height 5\' 9"  (1.753 m), weight 81.3 kg (179 lb 3.7 oz), SpO2 97.00%.  GEN:  Pleasant person lying in the stretcher in no acute distress; cooperative with exam PSYCH:  alert and oriented x4; does not appear anxious does not appear depressed; affect is normal HEENT: Mucous membranes pink and anicteric; PERRLA; EOM intact; no cervical lymphadenopathy nor thyromegaly or carotid bruit; no JVD; Breasts:: Not examined CHEST WALL: No tenderness CHEST: Normal respiration, clear to auscultation bilaterally HEART: Regular rate and rhythm with soft murmur at the left sternal border. BACK: No kyphosis or scoliosis; no CVA tenderness ABDOMEN: Obese, soft non-tender; no masses, no organomegaly, normal abdominal bowel sounds; no pannus; no intertriginous candida. Rectal Exam: Not done EXTREMITIES: No bone or joint deformity; age-appropriate arthropathy of the hands and knees; no edema; no ulcerations. Genitalia: not examined PULSES: 2+ and symmetric SKIN: Normal hydration no rash or ulceration CNS: Cranial nerves 2-12 grossly intact no focal neurologic deficit   Labs & Imaging Results for orders placed during the hospital encounter of 08/03/11 (from the past 48 hour(s))  GLUCOSE, CAPILLARY     Status: Abnormal   Collection Time   08/03/11 11:59 AM      Component Value Range Comment   Glucose-Capillary 390 (*) 70 - 99 (mg/dL)    Comment 1 Notify RN     CBC     Status: Abnormal   Collection Time   08/03/11 12:44 PM      Component Value Range Comment   WBC 17.0 (*) 4.0 - 10.5 (K/uL)    RBC  4.71  4.22 - 5.81 (MIL/uL)    Hemoglobin 14.2  13.0 - 17.0 (g/dL)    HCT 16.1  09.6 - 04.5 (%)    MCV 89.0  78.0 - 100.0 (fL)    MCH 30.1  26.0 - 34.0 (pg)    MCHC 33.9  30.0 - 36.0 (g/dL)    RDW 40.9  81.1 - 91.4 (%)    Platelets 221  150 - 400 (K/uL)  DIFFERENTIAL     Status: Abnormal   Collection Time   08/03/11 12:44 PM      Component Value Range Comment   Neutrophils Relative 83 (*) 43 - 77 (%)    Neutro Abs 14.1 (*) 1.7 - 7.7 (K/uL)    Lymphocytes Relative 11 (*) 12 - 46 (%)    Lymphs Abs 1.8  0.7 - 4.0 (K/uL)    Monocytes Relative 6  3 - 12 (%)    Monocytes Absolute 1.1 (*) 0.1 - 1.0 (K/uL)    Eosinophils Relative 0  0 - 5 (%)    Eosinophils Absolute 0.0  0.0 - 0.7 (K/uL)    Basophils Relative 0  0 - 1 (%)    Basophils Absolute 0.0  0.0 - 0.1 (K/uL)   COMPREHENSIVE METABOLIC PANEL     Status: Abnormal   Collection Time   08/03/11 12:44 PM      Component Value Range Comment   Sodium 134 (*) 135 - 145 (mEq/L)    Potassium 5.3 (*) 3.5 - 5.1 (mEq/L)    Chloride 97  96 - 112 (mEq/L)    CO2 17 (*) 19 - 32 (mEq/L)    Glucose, Bld 400 (*) 70 - 99 (mg/dL)    BUN 39 (*) 6 - 23 (mg/dL)    Creatinine, Ser 1.61 (*) 0.50 - 1.35 (mg/dL)    Calcium 09.6 (*) 8.4 - 10.5 (mg/dL)    Total Protein 7.6  6.0 - 8.3 (g/dL)    Albumin 4.1  3.5 - 5.2 (g/dL)    AST 045 (*) 0 - 37 (U/L)    ALT 113 (*) 0 - 53 (U/L)    Alkaline Phosphatase 67  39 - 117 (U/L)    Total Bilirubin 0.7  0.3 - 1.2 (mg/dL)    GFR calc non Af Amer 29 (*) >90 (mL/min)    GFR calc Af Amer 34 (*) >90 (mL/min)   LIPASE, BLOOD     Status: Normal   Collection Time   08/03/11 12:44 PM      Component Value Range Comment   Lipase 28  11 - 59 (U/L)   POCT I-STAT TROPONIN I     Status: Abnormal   Collection Time   08/03/11  1:16 PM      Component Value Range Comment   Troponin i, poc 47.97 (*) 0.00 - 0.08 (ng/mL)    Comment NOTIFIED PHYSICIAN      Comment 3            POCT I-STAT, CHEM 8     Status: Abnormal   Collection  Time   08/03/11  1:20 PM      Component Value Range Comment   Sodium 137  135 - 145 (mEq/L)    Potassium 5.6 (*) 3.5 - 5.1 (mEq/L)    Chloride 110  96 - 112 (mEq/L)    BUN 41 (*) 6 - 23 (mg/dL)    Creatinine, Ser 4.09 (*) 0.50 - 1.35 (mg/dL)    Glucose, Bld 811 (*) 70 - 99 (mg/dL)    Calcium, Ion 9.14  1.12 - 1.32 (mmol/L)    TCO2 16  0 - 100 (mmol/L)    Hemoglobin 15.0  13.0 - 17.0 (g/dL)    HCT 78.2  95.6 - 21.3 (%)   POCT I-STAT, CHEM 8     Status: Abnormal   Collection Time   08/03/11  2:10 PM      Component Value Range Comment   Sodium 138  135 - 145 (mEq/L)    Potassium 4.7  3.5 - 5.1 (mEq/L)    Chloride 112  96 - 112 (mEq/L)    BUN 37 (*) 6 - 23 (mg/dL)    Creatinine, Ser 1.61 (*) 0.50 - 1.35 (mg/dL)    Glucose, Bld 096 (*) 70 - 99 (mg/dL)    Calcium, Ion 0.45  1.12 - 1.32 (mmol/L)    TCO2 15  0 - 100 (mmol/L)    Hemoglobin 14.3  13.0 - 17.0 (g/dL)    HCT 40.9  81.1 - 91.4 (%)   POCT ACTIVATED CLOTTING TIME     Status: Normal   Collection Time   08/03/11  2:12 PM      Component Value Range Comment   Activated Clotting Time 551     CARDIAC PANEL(CRET KIN+CKTOT+MB+TROPI)     Status: Abnormal   Collection Time   08/03/11  7:01 PM      Component Value Range Comment   Total CK 5370 (*) 7 - 232 (U/L)    CK, MB 233.9 (*) 0.3 - 4.0 (ng/mL)    Troponin I >25.00 (*) <0.30 (ng/mL)    Relative Index 4.4 (*) 0.0 - 2.5    PROTIME-INR     Status: Abnormal   Collection Time   08/03/11  7:01 PM      Component Value Range Comment   Prothrombin Time 27.4 (*) 11.6 - 15.2 (seconds)    INR 2.50 (*) 0.00 - 1.49    MAGNESIUM     Status: Normal   Collection Time   08/03/11  7:01 PM      Component Value Range Comment   Magnesium 2.4  1.5 - 2.5 (mg/dL)   CBC     Status: Abnormal   Collection Time   08/03/11  7:01 PM      Component Value Range Comment   WBC 18.9 (*) 4.0 - 10.5 (K/uL)    RBC 4.55  4.22 - 5.81 (MIL/uL)    Hemoglobin 13.6  13.0 - 17.0 (g/dL)    HCT 78.2  95.6 - 21.3 (%)      MCV 87.9  78.0 - 100.0 (fL)    MCH 29.9  26.0 - 34.0 (pg)    MCHC 34.0  30.0 - 36.0 (g/dL)    RDW 08.6  57.8 - 46.9 (%)    Platelets 214  150 - 400 (K/uL)   CREATININE, SERUM     Status: Abnormal   Collection Time   08/03/11  7:01 PM      Component Value Range Comment   Creatinine, Ser 1.68 (*) 0.50 - 1.35 (mg/dL)    GFR calc non Af Amer 36 (*) >90 (mL/min)    GFR calc Af Amer 42 (*) >90 (mL/min)   BASIC METABOLIC PANEL     Status: Abnormal   Collection Time   08/03/11  7:01 PM      Component Value Range Comment   Sodium 135  135 - 145 (mEq/L)    Potassium 5.4 (*) 3.5 - 5.1 (mEq/L)    Chloride 103  96 - 112 (mEq/L)    CO2 13 (*) 19 - 32 (mEq/L)    Glucose, Bld 313 (*) 70 - 99 (mg/dL)    BUN 38 (*) 6 - 23 (mg/dL)    Creatinine, Ser 6.29 (*) 0.50 - 1.35 (mg/dL)    Calcium 9.2  8.4 - 10.5 (mg/dL)    GFR calc non Af Amer 36 (*) >90 (mL/min)    GFR  calc Af Amer 42 (*) >90 (mL/min)   MRSA PCR SCREENING     Status: Normal   Collection Time   08/03/11  7:27 PM      Component Value Range Comment   MRSA by PCR NEGATIVE  NEGATIVE    GLUCOSE, CAPILLARY     Status: Abnormal   Collection Time   08/03/11 10:36 PM      Component Value Range Comment   Glucose-Capillary 323 (*) 70 - 99 (mg/dL)   CARDIAC PANEL(CRET KIN+CKTOT+MB+TROPI)     Status: Abnormal   Collection Time   08/04/11 12:32 AM      Component Value Range Comment   Total CK 4729 (*) 7 - 232 (U/L)    CK, MB 196.7 (*) 0.3 - 4.0 (ng/mL) CRITICAL VALUE NOTED.  VALUE IS CONSISTENT WITH PREVIOUSLY REPORTED AND CALLED VALUE.   Troponin I >25.00 (*) <0.30 (ng/mL)    Relative Index 4.2 (*) 0.0 - 2.5    CARDIAC PANEL(CRET KIN+CKTOT+MB+TROPI)     Status: Abnormal   Collection Time   08/04/11  6:30 AM      Component Value Range Comment   Total CK 4335 (*) 7 - 232 (U/L)    CK, MB 141.9 (*) 0.3 - 4.0 (ng/mL) CRITICAL VALUE NOTED.  VALUE IS CONSISTENT WITH PREVIOUSLY REPORTED AND CALLED VALUE.   Troponin I >25.00 (*) <0.30 (ng/mL)     Relative Index 3.3 (*) 0.0 - 2.5    COMPREHENSIVE METABOLIC PANEL     Status: Abnormal   Collection Time   08/04/11  6:30 AM      Component Value Range Comment   Sodium 136  135 - 145 (mEq/L)    Potassium 4.7  3.5 - 5.1 (mEq/L)    Chloride 105  96 - 112 (mEq/L)    CO2 15 (*) 19 - 32 (mEq/L)    Glucose, Bld 309 (*) 70 - 99 (mg/dL)    BUN 47 (*) 6 - 23 (mg/dL)    Creatinine, Ser 1.61 (*) 0.50 - 1.35 (mg/dL)    Calcium 8.9  8.4 - 10.5 (mg/dL)    Total Protein 6.4  6.0 - 8.3 (g/dL)    Albumin 3.6  3.5 - 5.2 (g/dL)    AST 096 (*) 0 - 37 (U/L)    ALT 298 (*) 0 - 53 (U/L)    Alkaline Phosphatase 56  39 - 117 (U/L)    Total Bilirubin 0.6  0.3 - 1.2 (mg/dL)    GFR calc non Af Amer 27 (*) >90 (mL/min)    GFR calc Af Amer 31 (*) >90 (mL/min)   CBC     Status: Abnormal   Collection Time   08/04/11  6:30 AM      Component Value Range Comment   WBC 18.0 (*) 4.0 - 10.5 (K/uL)    RBC 4.10 (*) 4.22 - 5.81 (MIL/uL)    Hemoglobin 12.3 (*) 13.0 - 17.0 (g/dL)    HCT 04.5 (*) 40.9 - 52.0 (%)    MCV 89.8  78.0 - 100.0 (fL)    MCH 30.0  26.0 - 34.0 (pg)    MCHC 33.4  30.0 - 36.0 (g/dL)    RDW 81.1  91.4 - 78.2 (%)    Platelets 191  150 - 400 (K/uL)   LIPID PANEL     Status: Abnormal   Collection Time   08/04/11  6:30 AM      Component Value Range Comment   Cholesterol 158  0 - 200 (  mg/dL)    Triglycerides 409  <150 (mg/dL)    HDL 33 (*) >81 (mg/dL)    Total CHOL/HDL Ratio 4.8      VLDL 28  0 - 40 (mg/dL)    LDL Cholesterol 97  0 - 99 (mg/dL)   GLUCOSE, CAPILLARY     Status: Abnormal   Collection Time   08/04/11  7:53 AM      Component Value Range Comment   Glucose-Capillary 295 (*) 70 - 99 (mg/dL)   GLUCOSE, CAPILLARY     Status: Abnormal   Collection Time   08/04/11 11:42 AM      Component Value Range Comment   Glucose-Capillary 317 (*) 70 - 99 (mg/dL)   GLUCOSE, CAPILLARY     Status: Abnormal   Collection Time   08/04/11  6:28 PM      Component Value Range Comment   Glucose-Capillary  212 (*) 70 - 99 (mg/dL)   BASIC METABOLIC PANEL     Status: Abnormal   Collection Time   08/04/11  8:18 PM      Component Value Range Comment   Sodium 137  135 - 145 (mEq/L)    Potassium 4.3  3.5 - 5.1 (mEq/L)    Chloride 104  96 - 112 (mEq/L)    CO2 18 (*) 19 - 32 (mEq/L)    Glucose, Bld 171 (*) 70 - 99 (mg/dL)    BUN 47 (*) 6 - 23 (mg/dL)    Creatinine, Ser 1.91 (*) 0.50 - 1.35 (mg/dL)    Calcium 8.8  8.4 - 10.5 (mg/dL)    GFR calc non Af Amer 25 (*) >90 (mL/min)    GFR calc Af Amer 29 (*) >90 (mL/min)   KETONES, QUALITATIVE     Status: Normal   Collection Time   08/04/11  8:18 PM      Component Value Range Comment   Acetone, Bld NEGATIVE  NEGATIVE    LACTIC ACID, PLASMA     Status: Abnormal   Collection Time   08/04/11  8:20 PM      Component Value Range Comment   Lactic Acid, Venous 2.6 (*) 0.5 - 2.2 (mmol/L)   GLUCOSE, CAPILLARY     Status: Abnormal   Collection Time   08/04/11  9:00 PM      Component Value Range Comment   Glucose-Capillary 155 (*) 70 - 99 (mg/dL)    Comment 1 Notify RN      Dg Chest Port 1 View  08/04/2011  *RADIOLOGY REPORT*  Clinical Data: 78 year old male with weakness, confusion.  PORTABLE CHEST - 1 VIEW  Comparison: 08/03/2011 and earlier.  Findings: Portable semi upright AP view 0943 hours. Stable cardiomegaly and mediastinal contours.  Increased pulmonary vascular congestion.  No pneumothorax or large effusion.  Increased streaky lung base opacity greater on the left.  No consolidation.  IMPRESSION: Increased vascular congestion and atelectasis.  Original Report Authenticated By: Harley Hallmark, M.D.   Dg Chest Port 1 View  08/03/2011  *RADIOLOGY REPORT*  Clinical Data: Chest pain.  Shortness of breath.  PORTABLE CHEST - 1 VIEW  Comparison: 07/07/2009  Findings: Cardiomegaly and prior CABG noted.  Apical lordotic angulation was employed.  The lungs appear clear.  There is mild atherosclerotic calcification of the aortic arch.  IMPRESSION:  1.  Stable  cardiomegaly.  The lungs appear clear. 2.  Atherosclerosis.  Original Report Authenticated By: Dellia Cloud, M.D.      Assessment Present on Admission:  .Acute  MI, initial .DM w/o Complication Type II .CAD (coronary artery disease) .RBBB (right bundle branch block with left anterior fascicular block) .Renal insufficiency .Hypertension   PLAN:  I suspect his metabolic acidosis is a result of prior profuse diarrhea, aggravated with mild RTA from hypotension. He needed IV fluid, but I have changed to half normal saline. I fully agree with insulin sliding scale as written by Dr. Riley Kill. His blood glucose has come down nicely. Would continue to hold off on his metformin. [facility] team 5 Will continue to follow with you. I wonder if his confusion and agitation is really the result of "ICU psychosis" along with morphine. Thank you for asking [place] to participate in the care for your patient.  Other plans as per orders.   Hanifah Royse 08/05/2011, 5:59 AM

## 2011-08-05 NOTE — Progress Notes (Signed)
Pt c/o chest pain tightness 8/10 moaning, stat EKG ordered and morphine 1mg  IV given.

## 2011-08-05 NOTE — Progress Notes (Signed)
Patient Name: [name] Date of Encounter: 08/05/2011  Principal Problem:  *Acute MI, initial Active Problems:  DM w/o Complication Type II  CAD (coronary artery disease)  Hypertension  RBBB (right bundle branch block with left anterior fascicular block)  Renal insufficiency Abnormal LFTs    SUBJECTIVE: Pt oriented to name/place/family but has been confused and requires a sitter.   He c/o chest pain, pressure, that gets worse when he gets up or tries to do anything. He is very weak and unable to move self much without assistance. He rec'd Lasix x1 yesterday but was later placed back on 1/2 NS at 100cc/hr   OBJECTIVE  Filed Vitals:   08/05/11 0300 08/05/11 0400 08/05/11 0500 08/05/11 0600  BP: 129/64 119/65 130/67 137/65  Pulse: 52 52 54 51  Temp:  97.8 F (36.6 C)    TempSrc:  Axillary    Resp: 24 22 20 19   Height:      Weight:      SpO2: 98% 97% 97% 98%    Intake/Output Summary (Last 24 hours) at 08/05/11 0808 Last data filed at 08/05/11 0600  Gross per 24 hour  Intake   2329 ml  Output    200 ml  Net   2129 ml   Weight change:  Wt Readings from Last 3 Encounters:  08/04/11 179 lb 3.7 oz (81.3 kg)  08/04/11 179 lb 3.7 oz (81.3 kg)  07/22/11 183 lb 12.8 oz (83.371 kg)     PHYSICAL EXAM  General: Well developed, well nourished, elderly male, in mild distress. Head: Normocephalic, atraumatic.  Neck: Supple without bruits, JVD elevated at 10-12 cm. Lungs:  Resp regular and unlabored, bibasilar crackles, left > right. Heart: RRR, S1, S2, no S3, S4, 2/6 murmur. Abdomen: Soft, non-tender, non-distended, BS + x 4.  Extremities: No clubbing, cyanosis, No edema.  Neuro: Alert and oriented X 2. Moves all extremities spontaneously.  LABS:  CBC: Basename 08/05/11 0457 08/04/11 0630 08/03/11 1244  WBC 15.8* 18.0* --  NEUTROABS -- -- 14.1*  HGB 11.9* 12.3* --  HCT 35.8* 36.8* --  MCV 90.6 89.8 --  PLT 163 191 --   Basic Metabolic Panel: Basename  08/05/11 0457 08/04/11 2018 08/03/11 1901  NA 136 137 --  K 4.8 4.3 --  CL 102 104 --  CO2 18* 18* --  GLUCOSE 200* 171* --  BUN 50* 47* --  CREATININE 2.41* 2.25* --  CALCIUM 8.6 8.8 --  MG -- -- 2.4  PHOS -- -- --   Liver Function Tests: Basename 08/04/11 0630 08/03/11 1244  AST 601* 417*  ALT 298* 113*  ALKPHOS 56 67  BILITOT 0.6 0.7  PROT 6.4 7.6  ALBUMIN 3.6 4.1    Basename 08/03/11 1244  LIPASE 28  AMYLASE --   Cardiac Enzymes: Basename 08/04/11 0630 08/04/11 0032 08/03/11 1901  CKTOTAL 4335* 4729* 5370*  CKMB 141.9* 196.7* 233.9*  CKMBINDEX -- -- --  TROPONINI >25.00* >25.00* >25.00*    Hemoglobin A1C:No results found for this basename: HGBA1C in the last 72 hours Fasting Lipid Panel: Basename 08/04/11 0630  CHOL 158  HDL 33*  LDLCALC 97  TRIG 161  CHOLHDL 4.8  LDLDIRECT --    TELE:  Sinus bradycardia 50s      ECHO: done yesterday pm, prelim results EF 45% with posterior wall akinesis   Radiology/Studies: Dg Chest Port 1 View 08/04/2011  *RADIOLOGY REPORT*  Clinical Data: 78 year old male with weakness, confusion.  PORTABLE CHEST -  1 VIEW  Comparison: 08/03/2011 and earlier.  Findings: Portable semi upright AP view 0943 hours. Stable cardiomegaly and mediastinal contours.  Increased pulmonary vascular congestion.  No pneumothorax or large effusion.  Increased streaky lung base opacity greater on the left.  No consolidation.  IMPRESSION: Increased vascular congestion and atelectasis.  Original Report Authenticated By: Harley Hallmark, M.D.   Dg Chest Port 1 View  08/03/2011  *RADIOLOGY REPORT*  Clinical Data: Chest pain.  Shortness of breath.  PORTABLE CHEST - 1 VIEW  Comparison: 07/07/2009  Findings: Cardiomegaly and prior CABG noted.  Apical lordotic angulation was employed.  The lungs appear clear.  There is mild atherosclerotic calcification of the aortic arch.  IMPRESSION:  1.  Stable cardiomegaly.  The lungs appear clear. 2.  Atherosclerosis.  Original  Report Authenticated By: Dellia Cloud, M.D.    Current Medications:     . amLODipine  5 mg Oral Daily  . aspirin EC  81 mg Oral Daily  . bimatoprost  1 drop Both Eyes Daily  . cholecalciferol  2,000 Units Oral Daily  . clopidogrel  75 mg Oral Q breakfast  . dorzolamide  1 drop Both Eyes QHS  . enoxaparin  30 mg Subcutaneous Q24H  . furosemide  40 mg Intravenous Once  . insulin aspart  0-15 Units Subcutaneous TID WC  . insulin aspart  0-5 Units Subcutaneous QHS  . levothyroxine  25 mcg Oral Q breakfast  .  morphine injection  2 mg Intravenous Once  . sodium chloride  3 mL Intravenous Q12H    ASSESSMENT AND PLAN: Principal Problem:  *Acute MI, initial - s/p PTCA and thrombectomy of the SVG->OM  Active Problems:  DM w/o Complication Type II - on home Amaryl and SSI, IM seeing, A1C pending   Hypertension - SBP 130s at times but generally well-controlled. Off ACE (renal insuff) and BB/clonidine (bradycardia)   RBBB (right bundle branch block with left anterior fascicular block) - follow   Renal insufficiency - Cr trending up slightly, got Lasix yesterday for volume overload, follow   Abnormal LFTs - Crestor d/c'd, recheck in am  Confusion - ?CCU psychosis - has a sitter  Volume overload - rec'd Lasix 40 mg IV x 1 yesterday, maintaining O2 sats, has JVD and crackles on exam, will discuss Rx with MD.  Junctional rhythm - now sinus brady off BB.  Poor PO intake - add supplements  Signed, Theodore Demark , PA-C 8:08 AM 08/05/2011  Patient seen and examined, and I also spoke with his family.  Seen last pm as well for greater than one hour, and films and echo reviewed with two of his daughters.  He still has some confusion, but his family thinks that he is better than he was.  He knows me, and can identify each of them.   JVP is up. Lungs really with minimal crackles. I do not hear a rub.    Labs consistent with small rise in Cr.  CO2 is 18.  Glucose remains around  200.   Plan  1.  Observe rhythm. 2. Up in chair a bit. 3.  Low dose furosemide. 4.  Monitor Cr. 5.  Continue ASA/plavix.  6.  Subq lovenox 7.  Hold beta blockers due to bradycardia.  May eventually need pacing.

## 2011-08-05 NOTE — Progress Notes (Addendum)
Came to reevaluate patient  Had been complaining of chest/back pain earlier  BP 140/72  HR 54 (junctional)  Sat 97 Patient laying rel comfortably flat.   Currently said yes to chest pain  Then no. A little somnolent (just got morphine) Lung:  Rel clear anteriorly Cardiac  RRR.  No murmurs Abdomen:  Soft Ext No edema.  Warm. Neuro:  A little sleepy but otherwise nonfocal exam.  EKG:  Junctional rhythm.  RBBB.  IMpression:  Patient now 2d out from PTCA/thrombectomy. Curr pain free.  A little somnolent.  No focal abnormality  I would continue supportive care.  With age/size I would back off on freq of MSO4, d/c xanax. Follow.   Prognosis is guarded with age, signif of CAD, renal insuff. Discussed with T Stuckey.

## 2011-08-06 ENCOUNTER — Inpatient Hospital Stay (HOSPITAL_COMMUNITY): Payer: Medicare Other

## 2011-08-06 ENCOUNTER — Encounter (HOSPITAL_COMMUNITY): Payer: Self-pay | Admitting: Physician Assistant

## 2011-08-06 DIAGNOSIS — I509 Heart failure, unspecified: Secondary | ICD-10-CM | POA: Diagnosis not present

## 2011-08-06 DIAGNOSIS — R918 Other nonspecific abnormal finding of lung field: Secondary | ICD-10-CM | POA: Diagnosis not present

## 2011-08-06 DIAGNOSIS — I10 Essential (primary) hypertension: Secondary | ICD-10-CM | POA: Diagnosis not present

## 2011-08-06 DIAGNOSIS — R7989 Other specified abnormal findings of blood chemistry: Secondary | ICD-10-CM | POA: Diagnosis not present

## 2011-08-06 DIAGNOSIS — I219 Acute myocardial infarction, unspecified: Secondary | ICD-10-CM | POA: Diagnosis not present

## 2011-08-06 DIAGNOSIS — E119 Type 2 diabetes mellitus without complications: Secondary | ICD-10-CM | POA: Diagnosis not present

## 2011-08-06 DIAGNOSIS — R0989 Other specified symptoms and signs involving the circulatory and respiratory systems: Secondary | ICD-10-CM | POA: Diagnosis not present

## 2011-08-06 LAB — GLUCOSE, CAPILLARY
Glucose-Capillary: 308 mg/dL — ABNORMAL HIGH (ref 70–99)
Glucose-Capillary: 137 mg/dL — ABNORMAL HIGH (ref 70–99)
Glucose-Capillary: 133 mg/dL — ABNORMAL HIGH (ref 70–99)

## 2011-08-06 LAB — BASIC METABOLIC PANEL
BUN: 61 mg/dL — ABNORMAL HIGH (ref 6–23)
GFR calc Af Amer: 30 mL/min — ABNORMAL LOW (ref >90)
GFR calc non Af Amer: 26 mL/min — ABNORMAL LOW (ref >90)
Potassium: 4.4 mEq/L (ref 3.5–5.1)
Sodium: 137 mEq/L (ref 135–145)

## 2011-08-06 LAB — HEPATIC FUNCTION PANEL
ALT: 675 U/L — ABNORMAL HIGH (ref 0–53)
AST: 449 U/L — ABNORMAL HIGH (ref 0–37)
Bilirubin, Direct: 0.6 mg/dL — ABNORMAL HIGH (ref 0.0–0.3)
Indirect Bilirubin: 0.5 mg/dL (ref 0.3–0.9)
Total Protein: 6.3 g/dL (ref 6.0–8.3)

## 2011-08-06 MED ORDER — GLUCERNA SHAKE PO LIQD
237.0000 mL | Freq: Two times a day (BID) | ORAL | Status: DC
  Administered 2011-08-07 – 2011-08-11 (×7): 237 mL via ORAL
  Filled 2011-08-06: qty 237

## 2011-08-06 MED ORDER — SODIUM CHLORIDE 0.9 % IV SOLN
250.0000 mL | INTRAVENOUS | Status: AC | PRN
  Administered 2011-08-06: 50 mL via INTRAVENOUS

## 2011-08-06 MED ORDER — FUROSEMIDE 10 MG/ML IJ SOLN
20.0000 mg | Freq: Once | INTRAMUSCULAR | Status: AC
  Administered 2011-08-06: 20 mg via INTRAVENOUS
  Filled 2011-08-06: qty 2

## 2011-08-06 NOTE — Progress Notes (Signed)
INITIAL ADULT NUTRITION ASSESSMENT Date: 08/06/2011   Time: 2:50 PM  Reason for Assessment: poor PO intake  ASSESSMENT: Male 78 y.o.  Dx: Acute MI, initial  Hx:  Past Medical History  Diagnosis Date  . CAD (coronary artery disease)     Bare-metal stent to SVG circumflex 2008 / nuclear March, 2012, scar distal anterior, anterolateral and apical, no ischemia  . Hypertension   . Ejection fraction     45%, echo, March, 2012  . Aortic insufficiency     Moderate, echo, March, 2012  . Mitral regurgitation     Moderate, echo, March, 2012  . Pulmonary hypertension     Echo, March, 2012, 42 mmHg  . Hx of CABG   . Diabetes mellitus   . Dyslipidemia   . Hypothyroidism   . Bradycardia     Sinus bradycardia  . RBBB (right bundle branch block with left anterior fascicular block)     New July, 2010  . Leg cramps     Nighttime  . Shingles     Severe pain to right flank treated  . Dizziness     Mild, August, 2011  . Ischemia     .Marland KitchenMarland KitchenEF 42%  . Hyperlipidemia   . History of colonoscopy 2005  . Dizziness     December, 2012    Related Meds:     . amLODipine  5 mg Oral Daily  . aspirin EC  81 mg Oral Daily  . bimatoprost  1 drop Both Eyes Daily  . cholecalciferol  2,000 Units Oral Daily  . clopidogrel  75 mg Oral Q breakfast  . dorzolamide  1 drop Both Eyes QHS  . enoxaparin  30 mg Subcutaneous Q24H  . feeding supplement  1 Container Oral Q1500  . feeding supplement  237 mL Oral Once  . furosemide  20 mg Intravenous Once  . insulin aspart  0-15 Units Subcutaneous TID WC  . insulin aspart  0-5 Units Subcutaneous QHS  . insulin glargine  10 Units Subcutaneous QHS  . levothyroxine  25 mcg Oral Q breakfast  .  morphine injection  2 mg Intravenous Once  . sodium chloride  3 mL Intravenous Q12H   Ht: 5\' 9"  (175.3 cm)  Wt: 183 lb 13.8 oz (83.4 kg)  Ideal Wt: 72.7 kg % Ideal Wt: 115%  Usual Wt: 183 lb % Usual Wt: 100%  Body mass index is 27.15 kg/(m^2). Pt meets criteria  for overweight.  Food/Nutrition Related Hx: great appetite PTA; no weight loss  Labs:  CMP     Component Value Date/Time   NA 137 08/06/2011 0501   K 4.4 08/06/2011 0501   CL 105 08/06/2011 0501   CO2 18* 08/06/2011 0501   GLUCOSE 285* 08/06/2011 0501   BUN 61* 08/06/2011 0501   CREATININE 2.18* 08/06/2011 0501   CALCIUM 8.8 08/06/2011 0501   PROT 6.3 08/06/2011 0847   ALBUMIN 3.3* 08/06/2011 0847   AST 430* 08/06/2011 0847   ALT 675* 08/06/2011 0847   ALKPHOS 129* 08/06/2011 0847   BILITOT 1.2 08/06/2011 0847   GFRNONAA 26* 08/06/2011 0501   GFRAA 30* 08/06/2011 0501   CBG (last 3)   Basename 08/06/11 1152 08/06/11 0813 08/05/11 2132  GLUCAP 211* 308* 197*   Lab Results  Component Value Date   HGBA1C 6.8 08/03/2011   Intake/Output: I/O last 3 completed shifts: In: 1482 [P.O.:580; I.V.:900; IV Piggyback:2] Out: 825 [Urine:825] Total I/O In: 762.2 [P.O.:600; I.V.:162.2] Out: 225 [Urine:225]  Diet Order: Cardiac  50%  Supplements/Tube Feeding: Nepro TID prn (if pt eats <30%), Ensure Pudding daily, Glucerna Shake daily  IVF:    Estimated Nutritional Needs:   Kcal: 1800 - 2000 kcal Protein: 67 - 83 grams Fluid:  1.8 - 2 L/d  Admitted with n/v/d. Pt and wife state eating well with no weight loss PTA. Pt states he did not eat well for breakfast, but was unable to explain why. Ready to eat now, however NPO for procedure.  Pt on multiple supplements. Not receiving Nepro. Will continue Glucerna Shakes as pt enjoys these. Denies issues chewing or swallowing.  NUTRITION DIAGNOSIS: -Inadequate oral intake (NI-2.1).  Status: Ongoing  RELATED TO: limited appetite  AS EVIDENCE BY: poor intake of meals since admission.  MONITORING/EVALUATION(Goals): Goal: Pt to consume >/= 75% of meals and supplements. Monitor: PO intake, weights, labs, I/O's  EDUCATION NEEDS: -Education not appropriate at this time  INTERVENTION: 1. Discontinue Nepro TID PRN 2. Increase Glucerna Shakes to PO  BID 3. Discontinue Ensure Pudding PO daily 4. Will attempt diet education at more appropriate time 5. RD to continue to follow nutrition care plan  Dietitian #: [phone]  DOCUMENTATION CODES Per approved criteria  -Not Applicable    Adair Laundry 08/06/2011, 2:50 PM

## 2011-08-06 NOTE — Progress Notes (Signed)
Inpatient Diabetes Program Recommendations  AACE/ADA: New Consensus Statement on Inpatient Glycemic Control (2009)  Target Ranges:  Prepandial:   less than 140 mg/dL      Peak postprandial:   less than 180 mg/dL (1-2 hours)      Critically ill patients:  140 - 180 mg/dL   Results for ELISA, SORLIE (MRN [id number]) as of 08/06/2011 09:34  Ref. Range 08/05/2011 08:25 08/05/2011 12:01 08/05/2011 17:01 08/05/2011 21:32  Glucose-Capillary Latest Range: 70-99 mg/dL 469 (H) 629 (H) 528 (H) 197 (H)    Inpatient Diabetes Program Recommendations Insulin - Basal: Increase Lantus to 15  Thank you  Piedad Climes RN,BSN,CDE Inpatient Diabetes Coordinator

## 2011-08-06 NOTE — Progress Notes (Signed)
CARDIAC REHAB PHASE I   PRE:  Rate/Rhythm: 52 SB    BP: sitting 142/67    SaO2: 96 1L  MODE:  Ambulation: 50 ft   POST:  Rate/Rhythm: off monitor    BP: walking 126/58, sitting 132/86 after walk     SaO2: 94 2L  Pt very reluctant to walk. Needed great encouragement. C/o great fatigue. "Im just so tired." Used RW, 2L, and assist x2. Trouble turning RW due to fatigue. Sat x2 due to dizziness and fatigue. BP stable. To chair after walk. Will f/u.  [phone] Harriet Masson CES, ACSM

## 2011-08-06 NOTE — Consult Note (Signed)
Gastro Consult: 1:06 PM 08/06/2011   Referring Provider: Smith Robert  Primary Care Physician:  Marga Melnick, MD, MD Primary Gastroenterologist:  No GI physician in to reactions found  Reason for Consultation:  Elevated LFTs  HPI: [name] is a 78 y.o. male.  Patient has prior history of coronary artery disease.  Underwent CABG of 5 vessels in 1996.   Paramedical stent placement to the circumflex plaques vein graft in 2008. His EF is around 45%. Monday evening the patient and his wife ate dinner at a restaurant. On arrival home, within 30 minutes following the meal, the patient developed acute nausea, vomiting and diarrhea. He had only a couple of episodes of vomiting and a few more of diarrhea but all of this stopped within an hour and half. He felt poorly over the course of the next several hours and his wife nursed him with clear liquids. He was experiencing some chest heaviness but no pain, no palpitations. He was short of breath. He was not diaphoretic. The chest symptoms lasted all night long and he was directed by his physician to the [facility] emergency room. There there were significant changes on EKG, initial enzymes were positive for ruling of acute MI. His Pro BNP was 7191. He underwent catheterization on the 19th with a significant vein graft disease noted. Per Dr. Rosalyn Charters note plan is for future air metal stent intervention once the patient has stabilized.  Patient's liver enzymes were initially elevated at 601 and 298, they remain elevated but have fluctuated. Initial alkaline phosphatase was normal at 56 but today it is 129. His total bilirubin remains normal. When the patient arrived on the 19th his PT was 27.4 and his INR was 2.5.  He had not received Coumadin. Patient's glucose has been elevated as high as 309. He has progressive renal insufficiency.      Patient and his wife deny abdominal pain. Except for the other  nights episodes, the patient eats well and does not have problems with reflux or nausea. 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 He has never been told that his liver tests were abnormal. His gallbladder is intact. An ultrasound has been ordered but will be performed later today after he meets the n.p.o. requirement              Past Medical History  Diagnosis Date  . CAD (coronary artery disease)     Bare-metal stent to SVG circumflex 2008 / nuclear March, 2012, scar distal anterior, anterolateral and apical, no ischemia  . Hypertension   . Ejection fraction     45%, echo, March, 2012  . Aortic insufficiency     Moderate, echo, March, 2012  . Mitral regurgitation     Moderate, echo, March, 2012  . Pulmonary hypertension     Echo, March, 2012, 42 mmHg  . Hx of CABG   . Diabetes mellitus   . Dyslipidemia   . Hypothyroidism   . Bradycardia     Sinus bradycardia  . RBBB (right bundle branch block with left anterior fascicular block)     New July, 2010  . Leg cramps     Nighttime  . Shingles     Severe pain to right flank treated  . Dizziness     Mild, August, 2011  . Ischemia     .Marland KitchenMarland KitchenEF 42%  . Hyperlipidemia   . History of colonoscopy 2005  . Dizziness     December, 2012  Past Surgical History  Procedure Date  . Coronary artery bypass graft 1996    5 vessels  . Back surgery 1999    LS sx- 1999  . Spinal fusion 2000    scar tissue, plate insertion/fusion @ [facility] spine - 2000  . Cardiac surgery     Prior to Admission medications   Medication Sig Start Date End Date Taking? Authorizing Provider  acetaminophen (TYLENOL) 650 MG CR tablet Take 650 mg by mouth as needed. Pain   Yes Historical Provider, MD  amLODipine (NORVASC) 5 MG tablet Take 5 mg by mouth daily.   Yes Historical Provider, MD  aspirin 325 MG EC tablet Take 325 mg by mouth daily.   Yes Historical Provider, MD  benazepril (LOTENSIN) 40 MG tablet Take 40 mg by mouth daily. 04/06/11  Yes Pecola Lawless,  MD  carvedilol (COREG) 3.125 MG tablet Take 3.125 mg by mouth daily.   Yes Historical Provider, MD  cholecalciferol (VITAMIN D) 1000 UNITS tablet Take 2,000 Units by mouth daily.   Yes Historical Provider, MD  cloNIDine (CATAPRES) 0.1 MG tablet Take 0.1 mg by mouth 2 (two) times daily.   Yes Historical Provider, MD  dorzolamide (TRUSOPT) 2 % ophthalmic solution 1 drop both eyes in the evening 03/14/11  Yes Historical Provider, MD  glimepiride (AMARYL) 2 MG tablet Take 1 mg by mouth daily before breakfast.   Yes Historical Provider, MD  levothyroxine (SYNTHROID, LEVOTHROID) 25 MCG tablet Take 25 mcg by mouth daily.   Yes Historical Provider, MD  LUMIGAN 0.03 % ophthalmic solution 1 drop both eyes in the am 02/26/11  Yes Historical Provider, MD  metFORMIN (GLUCOPHAGE) 500 MG tablet Take 500 mg by mouth 2 (two) times daily with a meal.   Yes Historical Provider, MD    Scheduled Meds:    . amLODipine  5 mg Oral Daily  . aspirin EC  81 mg Oral Daily  . bimatoprost  1 drop Both Eyes Daily  . cholecalciferol  2,000 Units Oral Daily  . clopidogrel  75 mg Oral Q breakfast  . dorzolamide  1 drop Both Eyes QHS  . enoxaparin  30 mg Subcutaneous Q24H  . feeding supplement  1 Container Oral Q1500  . feeding supplement  237 mL Oral Once  . furosemide  20 mg Intravenous Once  . insulin aspart  0-15 Units Subcutaneous TID WC  . insulin aspart  0-5 Units Subcutaneous QHS  . insulin glargine  10 Units Subcutaneous QHS  . levothyroxine  25 mcg Oral Q breakfast  .  morphine injection  2 mg Intravenous Once  . sodium chloride  3 mL Intravenous Q12H   Infusions:   PRN Meds: sodium chloride, acetaminophen, feeding supplement (NEPRO CARB STEADY), morphine, nitroGLYCERIN, ondansetron (ZOFRAN) IV, sodium chloride, zolpidem, DISCONTD: sodium chloride, DISCONTD: ALPRAZolam   Allergies as of 08/03/2011 - Review Complete 08/03/2011  Allergen Reaction Noted  . Atorvastatin  09/17/2009  . Folic acid    .  Nicardipine hcl    . Pravastatin  11/30/2010    Family History  Problem Relation Age of Onset  . Diabetes    . Coronary artery disease Father     History   Social History  . Marital Status: Married    Spouse Name: N/A    Number of Children: N/A  . Years of Education: N/A   Occupational History  . Not on file.   Social History Main Topics  . Smoking status: Former Games developer  . Smokeless tobacco:  Not on file   Comment: Quit at age 67  . Alcohol Use: No  . Drug Use: No  . Sexually Active: Not on file   Other Topics Concern  . Not on file   Social History Narrative  . No narrative on file    REVIEW OF SYSTEMS: Full 14 point review of systems performed. Pertinent positives are noted above in history of present illness.  PHYSICAL EXAM: Vital signs in last 24 hours: Temp:  [97.8 F (36.6 C)-98.5 F (36.9 C)] 98.2 F (36.8 C) (03/22 0400) Pulse Rate:  [52-56] 55  (03/22 1027) Resp:  [20-32] 32  (03/22 0900) BP: (121-159)/(53-86) 132/86 mmHg (03/22 1027) SpO2:  [92 %-98 %] 98 % (03/22 1027) FiO2 (%):  [2 %] 2 % (03/21 2000) Weight:  [183 lb 13.8 oz (83.4 kg)] 183 lb 13.8 oz (83.4 kg) (03/22 0500)  General: ill, weak-appearing elderly white male Head:  Normocephalic, atraumatic  Eyes:  No conjunctival pallor, no scleral icterus Ears:  No hearing aids in place, not hard of hearing  Nose:  No discharge or blood present in the naris Mouth:  Dry mucous membranes, pink and clear. Dentition in good repair Neck:  No masses, bruits.  Minor JVD Lungs:  Minimal crackles at the bases. Slight dyspnea at rest. Heart: regular rate and rhythm. Soft systolic murmur at the apex. Normal S1 and S2. Abdomen:  Nondistended, bowel sounds active. No tenderness, no HSM. No bruits, no masses. No hernias.   Rectal: deferred   Musc/Skeltl: no gross deformity of the joints. Extremities:  No pedal edema, feet are warm. No cyanosis  Neurologic:  Patient somnolent, resting quietly but awakens to  questions. No tremors. Moves all 4 limbs easily. Skin:  No rashes, sores, telangiectasia Tattoos:  none Nodes:  No adenopathy at the neck or groin   Psych:  Depressed, not currently agitated. Cooperative  Intake/Output from previous day: 03/21 0701 - 03/22 0700 In: 520 [P.O.:520] Out: 625 [Urine:625] Intake/Output this shift: Total I/O In: 542.2 [P.O.:480; I.V.:62.2] Out: -   LAB RESULTS:  Basename 08/05/11 0457 08/04/11 0630 08/03/11 1901  WBC 15.8* 18.0* 18.9*  HGB 11.9* 12.3* 13.6  HCT 35.8* 36.8* 40.0  PLT 163 191 214   BMET Lab Results  Component Value Date   NA 137 08/06/2011   NA 136 08/05/2011   NA 137 08/04/2011   K 4.4 08/06/2011   K 4.8 08/05/2011   K 4.3 08/04/2011   CL 105 08/06/2011   CL 102 08/05/2011   CL 104 08/04/2011   CO2 18* 08/06/2011   CO2 18* 08/05/2011   CO2 18* 08/04/2011   GLUCOSE 285* 08/06/2011   GLUCOSE 200* 08/05/2011   GLUCOSE 171* 08/04/2011   BUN 61* 08/06/2011   BUN 50* 08/05/2011   BUN 47* 08/04/2011   CREATININE 2.18* 08/06/2011   CREATININE 2.41* 08/05/2011   CREATININE 2.25* 08/04/2011   CALCIUM 8.8 08/06/2011   CALCIUM 8.6 08/05/2011   CALCIUM 8.8 08/04/2011   LFT  Basename 08/06/11 0847 08/06/11 0501 08/04/11 0630  PROT 6.3 6.2 6.4  ALBUMIN 3.3* 3.3* 3.6  AST 430* 449* 601*  ALT 675* 655* 298*  ALKPHOS 129* 98 56  BILITOT 1.2 1.1 0.6  BILIDIR 0.7* 0.6* --  IBILI 0.5 0.5 --   PT/INR Lab Results  Component Value Date   INR 2.50* 08/03/2011        PT  27.4 Hepatitis Panel No results found for this basename: HEPBSAG,HCVAB,HEPAIGM,HEPBIGM in the last 72 hours C-Diff No components found with this basename: cdiff    Drugs of Abuse  No results found for this basename: labopia, cocainscrnur, labbenz, amphetmu, thcu, labbarb     RADIOLOGY STUDIES: Dg Chest Port 1v Same Day  08/06/2011  *RADIOLOGY REPORT*  Clinical Data: Congestive heart failure  PORTABLE CHEST - 1 VIEW SAME DAY  Comparison: 08/04/2011.   Findings: The patient is status post median sternotomy and CABG.  Cardiomegaly is again identified and is stable in degree. Prominence of the superior mediastinum is stable.  The lung fields demonstrate interval improvement in pulmonary vascular congestion. No overt congestive failure is seen.  Focal alveolar infiltrate is noted at the left lung base and could represent atelectasis although focal alveolar edema foreign pneumonia is not excluded. This appears stable in comparison with prior exam.  No pleural fluid is noted.  IMPRESSION: Improved pulmonary vascular congestion.  Unchanged cardiomegaly and superior mediastinal prominence.  Unchanged left lower lobe infiltrate, question atelectasis versus focal edema fluid with pneumonia not excluded in the appropriate clinical setting  Original Report Authenticated By: Bertha Stakes, M.D.    ENDOSCOPIC STUDIES: No report found. Past medical history listing mentions 8 2005 colonoscopy.  IMPRESSION: 1. Acute myocardial infarct. Cardiac cath findings of extensive graft occlusions. Dr. Riley Kill is cath report of 08/03/2011 mentions probable elective PCI of ramus intermedius once the patient has stabilized. Prior CABG.  Patient is receiving Plavix, a new med for him. 2.  Elevated transaminase, initially alkaline phosphatase normal but today it is abnormally elevated. Ultrasound of the abdomen is pending. No prior history of liver disease or gallbladder disease.  These may be elevated on the basis of his acute MI, ultrasound is pending and can reveal if there is any problem of gallbladder or liver disease. 3. Coagulopathy, present on arrival. 4. Acute renal failure. 5. Type 2 diabetes, no insulin in use prior to admission  PLAN: 1. Await ultrasound findings. 2. Consider adding  PPI prophylaxis.    LOS: 3 days   Jennye Moccasin  08/06/2011, 1:06 PM Pager: [phone]      ________________________________________________________________________  Corinda Gubler  GI MD note:  I personally examined the patient, reviewed the data and agree with the assessment and plan described above.  I suspect this is all from ischemic hepatitis, perhaps some passive congestion from CHF.  I doubt a separate, underlying liver process.  Will follow LFTs and he is still scheduled for [place] today.  He can eat after the [place].  Dr. Loreta Ave is covering [place] [facility] this weekend   Rob Bunting, MD [facility] Gastroenterology Pager [phone]

## 2011-08-06 NOTE — Progress Notes (Signed)
Subjective:  Patient is stable.  He is aggravated over being here, but is oriented to person, time and place.    Objective:  Vital Signs in the last 24 hours: Temp:  [97.6 F (36.4 C)-98.5 F (36.9 C)] 98.2 F (36.8 C) (03/22 0400) Pulse Rate:  [52-56] 52  (03/22 0800) Resp:  [17-27] 22  (03/22 0825) BP: (121-159)/(53-81) 149/71 mmHg (03/22 0800) SpO2:  [88 %-100 %] 95 % (03/22 0825) FiO2 (%):  [2 %-3 %] 2 % (03/21 2000) Weight:  [183 lb 13.8 oz (83.4 kg)] 183 lb 13.8 oz (83.4 kg) (03/22 0500)  Intake/Output from previous day: 03/21 0701 - 03/22 0700 In: 520 [P.O.:520] Out: 625 [Urine:625]   Physical Exam: General: Well developed, well nourished, in general upset with the situation.  Head:  Normocephalic and atraumatic. Neck:  Mild JVP, less than yesterday Lungs: Clear to auscultation and percussion.  Minimal crackles at bases only.  Heart: Normal S1 and S2.  Apical murmur  Pulses: Pulses normal in all 4 extremities. Extremities: No clubbing or cyanosis. No edema. Neurologic:  oriented x 3.  Not smiling    Lab Results:  Basename 08/05/11 0457 08/04/11 0630  WBC 15.8* 18.0*  HGB 11.9* 12.3*  PLT 163 191    Basename 08/06/11 0501 08/05/11 0457  NA 137 136  K 4.4 4.8  CL 105 102  CO2 18* 18*  GLUCOSE 285* 200*  BUN 61* 50*  CREATININE 2.18* 2.41*    Basename 08/04/11 0630 08/04/11 0032  TROPONINI >25.00* >25.00*   Hepatic Function Panel  Basename 08/06/11 0501  PROT 6.2  ALBUMIN 3.3*  AST 449*  ALT 655*  ALKPHOS 98  BILITOT 1.1  BILIDIR 0.6*  IBILI 0.5    Basename 08/04/11 0630  CHOL 158   No results found for this basename: PROTIME in the last 72 hours  Imaging: Dg Chest Port 1 View  08/04/2011  *RADIOLOGY REPORT*  Clinical Data: 78 year old male with weakness, confusion.  PORTABLE CHEST - 1 VIEW  Comparison: 08/03/2011 and earlier.  Findings: Portable semi upright AP view 0943 hours. Stable cardiomegaly and mediastinal contours.  Increased  pulmonary vascular congestion.  No pneumothorax or large effusion.  Increased streaky lung base opacity greater on the left.  No consolidation.  IMPRESSION: Increased vascular congestion and atelectasis.  Original Report Authenticated By: Harley Hallmark, M.D.      Assessment/Plan:  Patient Active Hospital Problem List: Acute MI, initial (08/03/2011)   Assessment: hemodynamically stable.  Poss some RV involvement.  Chest pain without complaint since last pm   Plan: continue to observe DM w/o Complication Type II (01/16/2008)   Assessment: glucose 308 but 4 grapejuices.     Plan: continue to monitor Hypertension ()   Assessment: still elevated but controlled.   Plan: monitor Renal insufficiency (08/03/2011)   Assessment: cr improved   Plan: continue gentle hydration Abnormal LFTs (08/05/2011)   Assessment: SGOT and PT elevated---PT elevated out of proportion to MI.  Could patient have had an acute GB episode.     Plan: GI advice and review meds.  Beta blockers and ACE on hold for bradycardia, and renal insufficiency, respectively.    Spoke with daughter      Shawnie Pons, MD, [facility], [place] 08/06/2011, 8:28 AM

## 2011-08-07 ENCOUNTER — Other Ambulatory Visit: Payer: Self-pay

## 2011-08-07 DIAGNOSIS — I5023 Acute on chronic systolic (congestive) heart failure: Secondary | ICD-10-CM | POA: Diagnosis not present

## 2011-08-07 DIAGNOSIS — T82897A Other specified complication of cardiac prosthetic devices, implants and grafts, initial encounter: Secondary | ICD-10-CM | POA: Diagnosis not present

## 2011-08-07 DIAGNOSIS — I219 Acute myocardial infarction, unspecified: Secondary | ICD-10-CM | POA: Diagnosis not present

## 2011-08-07 DIAGNOSIS — I25810 Atherosclerosis of coronary artery bypass graft(s) without angina pectoris: Secondary | ICD-10-CM | POA: Diagnosis not present

## 2011-08-07 DIAGNOSIS — I2129 ST elevation (STEMI) myocardial infarction involving other sites: Secondary | ICD-10-CM | POA: Diagnosis not present

## 2011-08-07 LAB — BASIC METABOLIC PANEL
BUN: 52 mg/dL — ABNORMAL HIGH (ref 6–23)
GFR calc Af Amer: 37 mL/min — ABNORMAL LOW (ref >90)
GFR calc non Af Amer: 32 mL/min — ABNORMAL LOW (ref >90)
Potassium: 4.1 mEq/L (ref 3.5–5.1)

## 2011-08-07 LAB — CBC
Hemoglobin: 11.8 g/dL — ABNORMAL LOW (ref 13.0–17.0)
MCHC: 32.7 g/dL (ref 30.0–36.0)
RDW: 14.9 % (ref 11.5–15.5)
WBC: 11 10*3/uL — ABNORMAL HIGH (ref 4.0–10.5)

## 2011-08-07 LAB — GLUCOSE, CAPILLARY
Glucose-Capillary: 189 mg/dL — ABNORMAL HIGH (ref 70–99)
Glucose-Capillary: 277 mg/dL — ABNORMAL HIGH (ref 70–99)
Glucose-Capillary: 155 mg/dL — ABNORMAL HIGH (ref 70–99)
Glucose-Capillary: 149 mg/dL — ABNORMAL HIGH (ref 70–99)

## 2011-08-07 MED ORDER — ENOXAPARIN SODIUM 40 MG/0.4ML ~~LOC~~ SOLN
40.0000 mg | SUBCUTANEOUS | Status: DC
  Administered 2011-08-08 – 2011-08-09 (×2): 40 mg via SUBCUTANEOUS
  Filled 2011-08-07 (×3): qty 0.4

## 2011-08-07 MED ORDER — AMLODIPINE BESYLATE 10 MG PO TABS
10.0000 mg | ORAL_TABLET | Freq: Every day | ORAL | Status: DC
  Administered 2011-08-07 – 2011-08-11 (×5): 10 mg via ORAL
  Filled 2011-08-07 (×5): qty 1

## 2011-08-07 NOTE — Progress Notes (Signed)
CARDIAC REHAB PHASE I   PRE:  Rate/Rhythm: 89 NSR  BP:  Supine:   Sitting: 160/95  Standing:    SaO2: 97 2L NCC  MODE:  Ambulation: 340 ft   POST:  Rate/Rhythem: 97 NSR  BP:  Supine:   Sitting: 171/75  Standing:    SaO2: 99% 2L NCC  Patient tolerated well. Ambulated x2 assist with RW and oxygen. RN aware of pre BP and stated it was ok to ambulate patient. Gait a little unsteady. Instructed how to stay inside the RW. VSS. Will continue to follow patient.  Maxwell, Randy Schifano L

## 2011-08-07 NOTE — Progress Notes (Signed)
Peyton Bottoms, MD, [facility] ABIM Board Certified in Adult Cardiovascular Medicine,Internal Medicine and Critical Care Medicine    SUBJECTIVE:  Still reports some left sided SCP. No SOB. No abdominal pain.   Principal Problem:  *Acute MI, initial Active Problems:  DM w/o Complication Type II  CAD (coronary artery disease)  Hypertension  RBBB (right bundle branch block with left anterior fascicular block)  Renal insufficiency  Abnormal LFTs   Past Medical History  Diagnosis Date  . CAD (coronary artery disease)     Bare-metal stent to SVG circumflex 2008 / nuclear March, 2012, scar distal anterior, anterolateral and apical, no ischemia  . Hypertension   . Ejection fraction     45%, echo, March, 2012  . Aortic insufficiency     Moderate, echo, March, 2012  . Mitral regurgitation     Moderate, echo, March, 2012  . Pulmonary hypertension     Echo, March, 2012, 42 mmHg  . Hx of CABG   . Diabetes mellitus   . Dyslipidemia   . Hypothyroidism   . Bradycardia     Sinus bradycardia  . RBBB (right bundle branch block with left anterior fascicular block)     New July, 2010  . Leg cramps     Nighttime  . Shingles     Severe pain to right flank treated  . Dizziness     Mild, August, 2011  . Ischemia     .Marland KitchenMarland KitchenEF 42%  . Hyperlipidemia   . History of colonoscopy 2005  . Dizziness     December, 2012    Current Facility-Administered Medications  Medication Dose Route Frequency Provider Last Rate Last Dose  . 0.9 %  sodium chloride infusion  250 mL Intravenous PRN Herby Abraham, MD 50 mL/hr at 08/06/11 1049 50 mL at 08/06/11 1049  . acetaminophen (TYLENOL) tablet 650 mg  650 mg Oral Q4H PRN Darrol Jump, PA   650 mg at 08/04/11 2134  . amLODipine (NORVASC) tablet 5 mg  5 mg Oral Daily Joline Salt Barrett, PA   5 mg at 08/06/11 1027  . aspirin EC tablet 81 mg  81 mg Oral Daily Rhonda G Barrett, PA   81 mg at 08/06/11 1027  . bimatoprost (LUMIGAN) 0.01 % ophthalmic solution 1  drop  1 drop Both Eyes Daily Herby Abraham, MD   1 drop at 08/06/11 1029  . cholecalciferol (VITAMIN D) tablet 2,000 Units  2,000 Units Oral Daily Joline Salt Barrett, PA   2,000 Units at 08/06/11 1028  . clopidogrel (PLAVIX) tablet 75 mg  75 mg Oral Q breakfast Herby Abraham, MD   75 mg at 08/06/11 0813  . dorzolamide (TRUSOPT) 2 % ophthalmic solution 1 drop  1 drop Both Eyes QHS Joline Salt Barrett, PA   1 drop at 08/06/11 2134  . enoxaparin (LOVENOX) injection 30 mg  30 mg Subcutaneous Q24H Herby Abraham, MD   30 mg at 08/06/11 1030  . feeding supplement (GLUCERNA SHAKE) liquid 237 mL  237 mL Oral BID Haynes Bast, RD      . furosemide (LASIX) injection 20 mg  20 mg Intravenous Once Herby Abraham, MD   20 mg at 08/06/11 1607  . insulin aspart (novoLOG) injection 0-15 Units  0-15 Units Subcutaneous TID WC Herby Abraham, MD   2 Units at 08/06/11 1702  . insulin aspart (novoLOG) injection 0-5 Units  0-5 Units Subcutaneous QHS Herby Abraham, MD   4 Units  at 08/03/11 2239  . insulin glargine (LANTUS) injection 10 Units  10 Units Subcutaneous QHS Altha Harm, MD   10 Units at 08/06/11 2134  . levothyroxine (SYNTHROID, LEVOTHROID) tablet 25 mcg  25 mcg Oral Q breakfast Joline Salt Barrett, PA   25 mcg at 08/06/11 0813  . morphine 2 MG/ML injection 2 mg  2 mg Intravenous Once Herby Abraham, MD      . morphine 4 MG/ML injection 1 mg  1 mg Intravenous Q2H PRN Pricilla Riffle, MD   1 mg at 08/07/11 0248  . nitroGLYCERIN (NITROSTAT) SL tablet 0.4 mg  0.4 mg Sublingual Q5 Min x 3 PRN Joline Salt Barrett, PA   0.4 mg at 08/07/11 0319  . ondansetron (ZOFRAN) injection 4 mg  4 mg Intravenous Q6H PRN Darrol Jump, PA   4 mg at 08/04/11 2336  . sodium chloride 0.9 % injection 3 mL  3 mL Intravenous Q12H Joline Salt Barrett, PA   3 mL at 08/06/11 2134  . sodium chloride 0.9 % injection 3 mL  3 mL Intravenous PRN Joline Salt Barrett, PA      . zolpidem (AMBIEN) tablet 5 mg  5 mg Oral QHS PRN Joline Salt Barrett, PA      . DISCONTD: 0.9 %  sodium chloride infusion  250 mL Intravenous PRN Joline Salt Barrett, PA 10 mL/hr at 08/04/11 0300 250 mL at 08/04/11 0300  . DISCONTD: feeding supplement (ENSURE) pudding 1 Container  1 Container Oral Q1500 Joline Salt Barrett, PA   1 Container at 08/05/11 1500  . DISCONTD: feeding supplement (GLUCERNA SHAKE) liquid 237 mL  237 mL Oral Once Darrol Jump, PA      . DISCONTD: feeding supplement (NEPRO CARB STEADY) liquid 237 mL  237 mL Oral TID WC PRN Darrol Jump, PA        LABS: Basic Metabolic Panel:  Basename 08/06/11 0501 08/05/11 0457  NA 137 136  K 4.4 4.8  CL 105 102  CO2 18* 18*  GLUCOSE 285* 200*  BUN 61* 50*  CREATININE 2.18* 2.41*  CALCIUM 8.8 8.6  MG -- --  PHOS -- --   Liver Function Tests:  [facility] 08/06/11 0847 08/06/11 0501  AST 430* 449*  ALT 675* 655*  ALKPHOS 129* 98  BILITOT 1.2 1.1  PROT 6.3 6.2  ALBUMIN 3.3* 3.3*   No results found for this basename: LIPASE:2,AMYLASE:2 in the last 72 hours CBC:  Basename 08/07/11 0620 08/05/11 0457  WBC 11.0* 15.8*  NEUTROABS -- --  HGB 11.8* 11.9*  HCT 36.1* 35.8*  MCV 89.1 90.6  PLT 169 163    RADIOLOGY: 1. MI secondary to extensive graft occlusion with late presentation.  2. Occluded SVG to the OM3 with suboptimal reperfusion secondary to extensive clotting.  3. Prior CABG with LIMA to the LAD, SVG to diagonal (known prior occlusion), SVG to OM3 and distally with prior ostial stenting.  4. Preserved LV in past - No LV secondary to elevated creatinine.  Recommendations:  1. Probable elective PCI of ramus intermedius when patient stabilizes.     PHYSICAL EXAM  Filed Vitals:   08/07/11 0300 08/07/11 0400 08/07/11 0500 08/07/11 0600  BP: 144/67 139/53 148/67 151/72  Pulse: 65 66 65 71  Temp:  97.6 F (36.4 C)    TempSrc:  Oral    Resp:      Height:      Weight:   183 lb 3.2 oz (83.1 kg)  SpO2: 96% 94% 98% 96%   BP 151/72  Pulse 71  Temp(Src) 97.6 F  (36.4 C) (Oral)  Resp 32  Ht 5\' 9"  (1.753 m)  Wt 183 lb 3.2 oz (83.1 kg)  BMI 27.05 kg/m2  SpO2 96%  Intake/Output Summary (Last 24 hours) at 08/07/11 0812 Last data filed at 08/07/11 0600  Gross per 24 hour  Intake 1246.34 ml  Output   1725 ml  Net -478.66 ml   I/O last 3 completed shifts: In: 1786.3 [P.O.:1380; I.V.:406.3] Out: 2175 [Urine:2175] General: well- nourished. No distress HEENT: normal carotid upstroke. Normal JVP. No thyromegaly Cardiac: RRR, NL S1/S2.II/-III/VI LLSB murmur Lungs: clear BS bilaterally, no wheezing. Abdomen, soft, non- tender. No rebound /guarding Extremities. No edema. Normal distal pulses Skin: warm and dry Psychologic: normal affect.   TELEMETRY:NSR. Wenckebach, Junctional rhytm  ASSESSMENT   1. STEMI (ST elevation myocardial infarction) -late presentation EF45-50% posterior AK- conservative therapy.   2. Hyperglycemia   3 4. 5 6 7  Abnormal LFTs elevated AST /ALT HTN Murmur- mild MR by ECHO  Renal Insufficiency- Cr. 1.86 Rhythm abnormalities- stable.    PLAN  Gi evaluation- no evidence for galbladder disease-no evidence/ steato hepatosis ? DM.  Possible elective PCI of ramus intermedius when patient stabilizes. However, patient family tells me apparently no plans.  General medicine to see for DM  Renal function improved. Pain management. Fu renal function.  Increase amlodipine 10 mg Po qdaily- consider ACEI in next couple of days PT.  Need to review ECHO because suspect valvular disease more significant then reported by 2 DECHO.    Alvin Critchley [facility] 08/07/2011, 8:12 AM

## 2011-08-07 NOTE — Progress Notes (Signed)
Subjective: Cross cover for [facility]. Patient denies having any abdominal pain. He is currently being helped by his aide to eat his breakfast.   Objective: Vital signs in last 24 hours: Temp:  [97.4 F (36.3 C)-97.9 F (36.6 C)] 97.6 F (36.4 C) (03/23 0400) Pulse Rate:  [47-71] 71  (03/23 0600) Resp:  [22-32] 32  (03/22 0900) BP: (125-156)/(53-86) 151/72 mmHg (03/23 0600) SpO2:  [94 %-99 %] 96 % (03/23 0600) Weight:  [83.1 kg (183 lb 3.2 oz)] 83.1 kg (183 lb 3.2 oz) (03/23 0500) Last BM Date: 08/02/11  Intake/Output from previous day: 03/22 0701 - 03/23 0700 In: 1486.3 [P.O.:1080; I.V.:406.3] Out: 1725 [Urine:1725] Intake/Output this shift:   General appearance: cooperative, fatigued and mild distress Resp: clear to auscultation bilaterally Cardio: regular rate and rhythm GI: soft, non-tender; bowel sounds normal; no masses,  no organomegaly  Lab Results:  [facility] 08/07/11 0620 08/05/11 0457  WBC 11.0* 15.8*  HGB 11.8* 11.9*  HCT 36.1* 35.8*  PLT 169 163   BMET  Basename 08/07/11 0620 08/06/11 0501 08/05/11 0457  NA 139 137 136  K 4.1 4.4 4.8  CL 105 105 102  CO2 19 18* 18*  GLUCOSE 232* 285* 200*  BUN 52* 61* 50*  CREATININE 1.86* 2.18* 2.41*  CALCIUM 8.6 8.8 8.6   LFT  Basename 08/06/11 0847  PROT 6.3  ALBUMIN 3.3*  AST 430*  ALT 675*  ALKPHOS 129*  BILITOT 1.2  BILIDIR 0.7*  IBILI 0.5     Studies/Results: US Abdomen Complete  08/06/2011  *RADIOLOGY REPORT*  Clinical Data:  Elevated liver function tests.  Diabetes and hypertension.  ABDOMINAL ULTRASOUND COMPLETE  Comparison:  None.  Findings:  Gallbladder:  No gallstones, gallbladder wall thickening, or pericholecystic fluid.  Common Bile Duct:  Within normal limits in caliber. Measures 4 mm in diameter.  Liver: No focal mass lesion identified.  Within normal limits in parenchymal echogenicity.  IVC:  Appears normal.  Pancreas:  No abnormality identified.  Spleen:  Within normal limits in size and  echotexture.  Right kidney:  Normal in size and parenchymal echogenicity.  No evidence of mass or hydronephrosis.  Left kidney:  Normal in size and parenchymal echogenicity.  No evidence of mass or hydronephrosis.  Abdominal Aorta:  No aneurysm identified.  IMPRESSION: Negative abdominal ultrasound.  Original Report Authenticated By: Danae Orleans, M.D.   Dg Chest Port 1v Same Day  08/06/2011  *RADIOLOGY REPORT*  Clinical Data: Congestive heart failure  PORTABLE CHEST - 1 VIEW SAME DAY  Comparison: 08/04/2011.  Findings: The patient is status post median sternotomy and CABG.  Cardiomegaly is again identified and is stable in degree. Prominence of the superior mediastinum is stable.  The lung fields demonstrate interval improvement in pulmonary vascular congestion. No overt congestive failure is seen.  Focal alveolar infiltrate is noted at the left lung base and could represent atelectasis although focal alveolar edema foreign pneumonia is not excluded. This appears stable in comparison with prior exam.  No pleural fluid is noted.  IMPRESSION: Improved pulmonary vascular congestion.  Unchanged cardiomegaly and superior mediastinal prominence.  Unchanged left lower lobe infiltrate, question atelectasis versus focal edema fluid with pneumonia not excluded in the appropriate clinical setting  Original Report Authenticated By: Bertha Stakes, M.D.    Medications: I have reviewed the patient's current medications.  Assessment/Plan: 1) Abnormal LFT's: thought to be due to ischemic liver. His LFT's are about the same as yesterday. We will follow these closely.  LOS: 4 days   Tydarius Yawn 08/07/2011, 8:19 AM

## 2011-08-07 NOTE — Evaluation (Signed)
Physical Therapy Evaluation Patient Details Name: [name] MRN: [id number] DOB: 1925/11/19 Today's Date: 08/07/2011  Problem List:  Patient Active Problem List  Diagnoses  . POSTHERPETIC NEURALGIA  . SHINGLES  . DM w/o Complication Type II  . VITAMIN D DEFICIENCY  . THROMBOCYTOPENIA  . GLAUCOMA, BORDERLINE  . ARTHRALGIA  . UNSPECIFIED MYALGIA AND MYOSITIS  . Dizziness and Giddiness  . SHORTNESS OF BREATH  . ADVERSE DRUG REACTION  . CAD (coronary artery disease)  . Hypertension  . Aortic insufficiency  . Mitral regurgitation  . Pulmonary hypertension  . Hx of CABG  . Diabetes mellitus  . Dyslipidemia  . Hypothyroidism  . Bradycardia  . RBBB (right bundle branch block with left anterior fascicular block)  . Leg cramps  . Shingles  . Dizziness  . Acute MI, initial  . Renal insufficiency  . Abnormal LFTs    Past Medical History:  Past Medical History  Diagnosis Date  . CAD (coronary artery disease)     Bare-metal stent to SVG circumflex 2008 / nuclear March, 2012, scar distal anterior, anterolateral and apical, no ischemia  . Hypertension   . Ejection fraction     45%, echo, March, 2012  . Aortic insufficiency     Moderate, echo, March, 2012  . Mitral regurgitation     Moderate, echo, March, 2012  . Pulmonary hypertension     Echo, March, 2012, 42 mmHg  . Hx of CABG   . Diabetes mellitus   . Dyslipidemia   . Hypothyroidism   . Bradycardia     Sinus bradycardia  . RBBB (right bundle branch block with left anterior fascicular block)     New July, 2010  . Leg cramps     Nighttime  . Shingles     Severe pain to right flank treated  . Dizziness     Mild, August, 2011  . Ischemia     .Marland KitchenMarland KitchenEF 42%  . Hyperlipidemia   . History of colonoscopy 2005  . Dizziness     December, 2012   Past Surgical History:  Past Surgical History  Procedure Date  . Coronary artery bypass graft 1996    5 vessels  . Back surgery 1999    LS sx- 1999  . Spinal fusion  2000    scar tissue, plate insertion/fusion @ LS spine - 2000  . Cardiac surgery     PT Assessment/Plan/Recommendation PT Assessment Clinical Impression Statement: Patient is s/p acute MI with new onset of confusion.  Daughter present and states that patient was very active PTA, still working and playing golf.  At present, patient needed at least minimal to moderate  assist to mobilize.  Will ask for Rehab consult as patient needs to be Modif I upon d/c home with wife.   PT Recommendation/Assessment: Patient will need skilled PT in the acute care venue PT Problem List: Decreased activity tolerance;Decreased balance;Decreased mobility;Decreased knowledge of use of DME;Decreased safety awareness PT Therapy Diagnosis : Difficulty walking PT Plan PT Frequency: Min 3X/week PT Treatment/Interventions: Gait training;DME instruction;Functional mobility training;Therapeutic activities;Therapeutic exercise;Patient/family education PT Recommendation Recommendations for Other Services: Rehab consult Follow Up Recommendations: Inpatient Rehab;Supervision/Assistance - 24 hour Equipment Recommended: Rolling walker with 5" wheels;3 in 1 bedside comode PT Goals  Acute Rehab PT Goals PT Goal Formulation: With patient Time For Goal Achievement: 7 days Pt will go Supine/Side to Sit: with supervision PT Goal: Supine/Side to Sit - Progress: Goal set today Pt will go Sit to Stand: with supervision PT  Goal: Sit to Stand - Progress: Goal set today Pt will go Stand to Sit: with supervision PT Goal: Stand to Sit - Progress: Goal set today Pt will Ambulate: >150 feet;with supervision;with least restrictive assistive device PT Goal: Ambulate - Progress: Goal set today Pt will Go Up / Down Stairs: 3-5 stairs;with supervision;with least restrictive assistive device PT Goal: Up/Down Stairs - Progress: Goal set today Pt will Perform Home Exercise Program: with supervision, verbal cues required/provided PT Goal:  Perform Home Exercise Program - Progress: Goal set today  PT Evaluation Precautions/Restrictions  Precautions Precautions: Fall Required Braces or Orthoses: No Restrictions Weight Bearing Restrictions: No RLE Weight Bearing: Weight bearing as tolerated Prior Functioning  Home Living Lives With: Spouse Receives Help From: Family Type of Home: House Home Layout: One level Home Access: Stairs to enter Entrance Stairs-Rails: None Entrance Stairs-Number of Steps: 4 Bathroom Shower/Tub: Tub/shower unit;Walk-in shower (used stand up shower) Bathroom Toilet: Standard Bathroom Accessibility: Yes How Accessible: Accessible via walker Home Adaptive Equipment: Straight cane Prior Function Level of Independence: Independent with basic ADLs;Independent with gait;Independent with transfers Driving: Yes Vocation: Full time employment Comments: [employer] - speaks 9x/wk and plays golf 3-4 x/wk Cognition Cognition Arousal/Alertness: Awake/alert Overall Cognitive Status: Impaired Memory: Appears impaired Orientation Level: Oriented to person;Disoriented to place;Disoriented to time;Disoriented to situation Safety/Judgement: Decreased awareness of safety precautions;Decreased safety judgement for tasks assessed Decreased Safety/Judgement: Decreased awareness of need for assistance;Impulsive Safety/Judgement - Other Comments: Decr safety using RW, needed cues for safety Awareness of Errors: Decreased awareness of errors made Decreased Awareness of Errors: Assistance required to correct errors made;Assistance required to identify errors made Sensation/Coordination Sensation Light Touch: Appears Intact Stereognosis: Not tested Hot/Cold: Not tested Proprioception: Not tested Coordination Gross Motor Movements are Fluid and Coordinated: Yes Fine Motor Movements are Fluid and Coordinated: Yes Extremity Assessment RUE Assessment RUE Assessment: Within Functional Limits LUE Assessment LUE  Assessment: Within Functional Limits RLE Assessment RLE Assessment: Within Functional Limits LLE Assessment LLE Assessment: Within Functional Limits Mobility (including Balance) Bed Mobility Bed Mobility: Yes Rolling Right: 3: Mod assist Rolling Right Details (indicate cue type and reason): cues for technique Right Sidelying to Sit: 3: Mod assist Right Sidelying to Sit Details (indicate cue type and reason): cues for technique Sitting - Scoot to Edge of Bed: 3: Mod assist Sitting - Scoot to Edge of Bed Details (indicate cue type and reason): assist with pad. Transfers Transfers: Yes Sit to Stand: 1: +2 Total assist;Patient percentage (comment);With upper extremity assist;From bed (pt= 65%) Sit to Stand Details (indicate cue type and reason): assist and cues for hand placement; pt. disoriented as to where he was, needed cues for orientation. Stand to Sit: With upper extremity assist;With armrests;To chair/3-in-1;3: Mod assist Stand to Sit Details: cues needed for hand placement Ambulation/Gait Ambulation/Gait: Yes Ambulation/Gait Assistance: 3: Mod assist Ambulation/Gait Assistance Details (indicate cue type and reason): Patient needed constant cues for RW safety as he was not staying close to RW especially with turns.   Ambulation Distance (Feet): 200 Feet Assistive device: Rolling walker Gait Pattern: Decreased stride length;Step-through pattern;Trunk flexed Stairs: No Wheelchair Mobility Wheelchair Mobility: No  Posture/Postural Control Posture/Postural Control: No significant limitations Balance Balance Assessed: No    End of Session PT - End of Session Equipment Utilized During Treatment: Gait belt Activity Tolerance: Patient tolerated treatment well Patient left: in chair;with call bell in reach;with family/visitor present Nurse Communication: Mobility status for transfers;Mobility status for ambulation General Behavior During Session: [facility] for tasks  performed Cognition: Impaired  INGOLD,Noelle Sease 08/07/2011, 11:10 AM  [facility] [phone] (pager)

## 2011-08-08 DIAGNOSIS — E860 Dehydration: Secondary | ICD-10-CM | POA: Diagnosis not present

## 2011-08-08 DIAGNOSIS — E872 Acidosis: Secondary | ICD-10-CM | POA: Diagnosis not present

## 2011-08-08 DIAGNOSIS — I219 Acute myocardial infarction, unspecified: Secondary | ICD-10-CM | POA: Diagnosis not present

## 2011-08-08 DIAGNOSIS — R7989 Other specified abnormal findings of blood chemistry: Secondary | ICD-10-CM | POA: Diagnosis not present

## 2011-08-08 LAB — HEPATIC FUNCTION PANEL
Albumin: 3 g/dL — ABNORMAL LOW (ref 3.5–5.2)
Indirect Bilirubin: 0.8 mg/dL (ref 0.3–0.9)
Total Protein: 6.2 g/dL (ref 6.0–8.3)

## 2011-08-08 LAB — GLUCOSE, CAPILLARY
Glucose-Capillary: 241 mg/dL — ABNORMAL HIGH (ref 70–99)
Glucose-Capillary: 159 mg/dL — ABNORMAL HIGH (ref 70–99)

## 2011-08-08 LAB — BASIC METABOLIC PANEL
CO2: 22 mEq/L (ref 19–32)
Calcium: 8.6 mg/dL (ref 8.4–10.5)
Chloride: 106 mEq/L (ref 96–112)
Glucose, Bld: 214 mg/dL — ABNORMAL HIGH (ref 70–99)
Sodium: 139 mEq/L (ref 135–145)

## 2011-08-08 MED ORDER — DOCUSATE SODIUM 100 MG PO CAPS
200.0000 mg | ORAL_CAPSULE | Freq: Two times a day (BID) | ORAL | Status: DC
  Administered 2011-08-08 – 2011-08-11 (×6): 200 mg via ORAL
  Filled 2011-08-08 (×7): qty 2

## 2011-08-08 MED ORDER — INSULIN GLARGINE 100 UNIT/ML ~~LOC~~ SOLN
16.0000 [IU] | Freq: Every day | SUBCUTANEOUS | Status: DC
  Administered 2011-08-08: 16 [IU] via SUBCUTANEOUS

## 2011-08-08 MED ORDER — LISINOPRIL 10 MG PO TABS
10.0000 mg | ORAL_TABLET | Freq: Every day | ORAL | Status: DC
  Administered 2011-08-08 – 2011-08-09 (×2): 10 mg via ORAL
  Filled 2011-08-08 (×3): qty 1

## 2011-08-08 NOTE — Progress Notes (Signed)
   Subjective: CROSS COVER [facility] Since I last evaluated the patient yesterday, he has had no  Major GI issues. He had some abdominal discomfort this morning but claims that has since resolved. He has not had a BM in several days.   Objective: Vital signs in last 24 hours: Temp:  [97.5 F (36.4 C)-98.1 F (36.7 C)] 98 F (36.7 C) (03/24 0800) Pulse Rate:  [67-98] 74  (03/24 1100) BP: (128-169)/(59-93) 131/73 mmHg (03/24 1327) SpO2:  [91 %-100 %] 94 % (03/24 1100) Weight:  [84.1 kg (185 lb 6.5 oz)] 84.1 kg (185 lb 6.5 oz) (03/24 0500) Last BM Date: 08/02/11  Intake/Output from previous day: 03/23 0701 - 03/24 0700 In: 1113 [P.O.:1110; I.V.:3] Out: 900 [Urine:900]  Intake/Output this shift: Total I/O In: 633 [P.O.:630; I.V.:3] Out: 350 [Urine:350]  General appearance: alert, cooperative, appears stated age and no distress Resp: clear to auscultation bilaterally Cardio: irregularly irregular rhythm GI: soft, non-tender; bowel sounds normal; no masses,  no organomegaly  Lab Results:  [facility] 08/07/11 0620  WBC 11.0*  HGB 11.8*  HCT 36.1*  PLT 169   BMET  Basename 08/08/11 0610 08/07/11 0620 08/06/11 0501  NA 139 139 137  K 3.5 4.1 4.4  CL 106 105 105  CO2 22 19 18*  GLUCOSE 214* 232* 285*  BUN 38* 52* 61*  CREATININE 1.40* 1.86* 2.18*  CALCIUM 8.6 8.6 8.8   LFT  Basename 08/06/11 0847  PROT 6.3  ALBUMIN 3.3*  AST 430*  ALT 675*  ALKPHOS 129*  BILITOT 1.2  BILIDIR 0.7*  IBILI 0.5   Studies/Results: US Abdomen Complete  08/06/2011  *RADIOLOGY REPORT*  Clinical Data:  Elevated liver function tests.  Diabetes and hypertension.  ABDOMINAL ULTRASOUND COMPLETE  Comparison:  None.  Findings:  Gallbladder:  No gallstones, gallbladder wall thickening, or pericholecystic fluid.  Common Bile Duct:  Within normal limits in caliber. Measures 4 mm in diameter.  Liver: No focal mass lesion identified.  Within normal limits in parenchymal echogenicity.  IVC:  Appears  normal.  Pancreas:  No abnormality identified.  Spleen:  Within normal limits in size and echotexture.  Right kidney:  Normal in size and parenchymal echogenicity.  No evidence of mass or hydronephrosis.  Left kidney:  Normal in size and parenchymal echogenicity.  No evidence of mass or hydronephrosis.  Abdominal Aorta:  No aneurysm identified.  IMPRESSION: Negative abdominal ultrasound.  Original Report Authenticated By: Danae Orleans, M.D.    Medications: I have reviewed the patient's current medications.  Assessment/Plan: Abnormal LFT's probbaly secondary to ischemiuc injury-recheck liver panel today.  2) STEMI being followed closely by [facility] cardiology. 3) Constipation: Allow patient Colace 100 mg 2 PO BID as needed.  4) IDDM 5) Renal insufficiency: BUN/Creatinine 38/1.40-improving gradually.I . LOS: 5 days   Patsy Zaragoza 08/08/2011, 2:48 PM

## 2011-08-08 NOTE — Consult Note (Signed)
[facility] - CONSULT F/U NOTE [place] TEAM 1 - Stepdown/ICU TEAM  Reason for the consult: Management of diabetes  Subjective: 78 y.o. male with history of diabetes on metformin and Amaryl, moderate aortic insufficiency, hypertension, hypothyroidism, history of right bundle branch block with left anterior fascicular block, having diarrhea for several days prior to being admitted for an acute MI.   This morning the pt is resting comfortably.  He denies f/c, sob, n/v, or abdom pain.   Objective: Weight change: 1 kg (2 lb 3.3 oz)  Intake/Output Summary (Last 24 hours) at 08/08/11 0906 Last data filed at 08/08/11 0800  Gross per 24 hour  Intake   1053 ml  Output   1050 ml  Net      3 ml   Blood pressure 152/92, pulse 79, temperature 97.5 F (36.4 C), temperature source Oral, resp. rate 18, height 5\' 9"  (1.753 m), weight 84.1 kg (185 lb 6.5 oz), SpO2 92.00%.  CBG (last 3)   Basename 08/07/11 2111 08/07/11 1725 08/07/11 1235  GLUCAP 149* 155* 277*   Physical Exam: General: No acute respiratory distress Lungs: Clear to auscultation bilaterally without wheezes or crackles Cardiovascular: Regular rate and rhythm without gallop or rub  Abdomen: Nontender, nondistended, soft, bowel sounds positive, no rebound, no ascites, no appreciable mass Extremities: No significant cyanosis, clubbing, or edema bilateral lower extremities  Lab Results:  Basename 08/08/11 0610 08/07/11 0620 08/06/11 0501  NA 139 139 137  K 3.5 4.1 4.4  CL 106 105 105  CO2 22 19 18*  GLUCOSE 214* 232* 285*  BUN 38* 52* 61*  CREATININE 1.40* 1.86* 2.18*  CALCIUM 8.6 8.6 8.8  MG -- -- --  PHOS -- -- --    Basename 08/06/11 0847 08/06/11 0501  AST 430* 449*  ALT 675* 655*  ALKPHOS 129* 98  BILITOT 1.2 1.1  PROT 6.3 6.2  ALBUMIN 3.3* 3.3*    Basename 08/07/11 0620  WBC 11.0*  NEUTROABS --  HGB 11.8*  HCT 36.1*  MCV 89.1  PLT 169   No results found for this basename:  CKTOTAL:3,CKMB:3,CKMBINDEX:3,TROPONINI:3 in the last 72 hours No results found for this basename: HGBA1C:12 in the last 72 hours  Micro Results: Recent Results (from the past 240 hour(s))  MRSA PCR SCREENING     Status: Normal   Collection Time   08/03/11  7:27 PM      Component Value Range Status Comment   MRSA by PCR NEGATIVE  NEGATIVE  Final     Studies/Results: All recent x-ray/radiology reports have been reviewed in detail.   Medications: I have reviewed the patient's complete medication list.  Recommendations:  DM Poorly controlled, but improving - cont to follow today w/o signif change in tx plan - A1c noted to be 6.8  Acute MI - CAD w/ hx of CABG 1996 Per primary team (Cardiology) - s/p PTCA and thrombectomy of the SVG->OM  Acute delirium This is my first day visiting the pt, but he appears to be only very mildly confused at this time - will follow  RBBB  Renal insuff Slowly recovering  HTN Reasonably well controlled at this time  Transaminitis/coagulopathy GI has been consulted and is following - felt to be due to ischemia  Hypothyroid Remains on replacement tx  Dyslipidemia  Normocytic anemia Hgb holding steady at this time  Dispo We will cont to follow along with you for diabetes management  Lonia Blood, MD [facility] Office  [phone] Pager [phone]  On-Call/Text Page:      [email or web address]      password [email or web address]

## 2011-08-08 NOTE — Progress Notes (Signed)
Peyton Bottoms, MD, [facility] ABIM Board Certified in Adult Cardiovascular Medicine,Internal Medicine and Critical Care Medicine    SUBJECTIVE: No recurrent SCP. Patient reports orthopnea. No PND. Walking with PT. Making progress.   Principal Problem:  *Acute MI, initial Active Problems:  DM w/o Complication Type II  CAD (coronary artery disease)  Hypertension  RBBB (right bundle branch block with left anterior fascicular block)  Renal insufficiency  Abnormal LFTs   Past Medical History  Diagnosis Date  . CAD (coronary artery disease)     Bare-metal stent to SVG circumflex 2008 / nuclear March, 2012, scar distal anterior, anterolateral and apical, no ischemia  . Hypertension   . Ejection fraction     45%, echo, March, 2012  . Aortic insufficiency     Moderate, echo, March, 2012  . Mitral regurgitation     Moderate, echo, March, 2012  . Pulmonary hypertension     Echo, March, 2012, 42 mmHg  . Hx of CABG   . Diabetes mellitus   . Dyslipidemia   . Hypothyroidism   . Bradycardia     Sinus bradycardia  . RBBB (right bundle branch block with left anterior fascicular block)     New July, 2010  . Leg cramps     Nighttime  . Shingles     Severe pain to right flank treated  . Dizziness     Mild, August, 2011  . Ischemia     .Marland KitchenMarland KitchenEF 42%  . Hyperlipidemia   . History of colonoscopy 2005  . Dizziness     December, 2012    Current Facility-Administered Medications  Medication Dose Route Frequency Provider Last Rate Last Dose  . acetaminophen (TYLENOL) tablet 650 mg  650 mg Oral Q4H PRN Darrol Jump, PA   650 mg at 08/04/11 2134  . amLODipine (NORVASC) tablet 10 mg  10 mg Oral Daily June Leap, MD   10 mg at 08/08/11 0953  . aspirin EC tablet 81 mg  81 mg Oral Daily Joline Salt Barrett, PA   81 mg at 08/08/11 0953  . bimatoprost (LUMIGAN) 0.01 % ophthalmic solution 1 drop  1 drop Both Eyes Daily Herby Abraham, MD   1 drop at 08/08/11 0953  . cholecalciferol (VITAMIN D)  tablet 2,000 Units  2,000 Units Oral Daily Joline Salt Barrett, PA   2,000 Units at 08/08/11 0953  . clopidogrel (PLAVIX) tablet 75 mg  75 mg Oral Q breakfast Herby Abraham, MD   75 mg at 08/08/11 0758  . dorzolamide (TRUSOPT) 2 % ophthalmic solution 1 drop  1 drop Both Eyes QHS Joline Salt Barrett, PA   1 drop at 08/07/11 2131  . enoxaparin (LOVENOX) injection 40 mg  40 mg Subcutaneous Q24H Wyline Copas, PHARMD      . feeding supplement (GLUCERNA SHAKE) liquid 237 mL  237 mL Oral BID Haynes Bast, RD   237 mL at 08/07/11 2131  . insulin aspart (novoLOG) injection 0-15 Units  0-15 Units Subcutaneous TID WC Herby Abraham, MD   5 Units at 08/08/11 [phone]  . insulin aspart (novoLOG) injection 0-5 Units  0-5 Units Subcutaneous QHS Herby Abraham, MD   4 Units at 08/03/11 2239  . insulin glargine (LANTUS) injection 10 Units  10 Units Subcutaneous QHS Altha Harm, MD   10 Units at 08/07/11 2132  . levothyroxine (SYNTHROID, LEVOTHROID) tablet 25 mcg  25 mcg Oral Q breakfast Joline Salt Barrett, PA   25  mcg at 08/08/11 0758  . morphine 2 MG/ML injection 2 mg  2 mg Intravenous Once Herby Abraham, MD      . morphine 4 MG/ML injection 1 mg  1 mg Intravenous Q2H PRN Pricilla Riffle, MD   1 mg at 08/08/11 0220  . nitroGLYCERIN (NITROSTAT) SL tablet 0.4 mg  0.4 mg Sublingual Q5 Min x 3 PRN Joline Salt Barrett, PA   0.4 mg at 08/07/11 0319  . ondansetron (ZOFRAN) injection 4 mg  4 mg Intravenous Q6H PRN Darrol Jump, PA   4 mg at 08/04/11 2336  . sodium chloride 0.9 % injection 3 mL  3 mL Intravenous Q12H Rhonda G Barrett, PA   3 mL at 08/08/11 0954  . sodium chloride 0.9 % injection 3 mL  3 mL Intravenous PRN Joline Salt Barrett, PA      . zolpidem (AMBIEN) tablet 5 mg  5 mg Oral QHS PRN Darrol Jump, PA      . DISCONTD: enoxaparin (LOVENOX) injection 30 mg  30 mg Subcutaneous Q24H Herby Abraham, MD   30 mg at 08/07/11 1015    LABS: Basic Metabolic Panel:  Basename 08/08/11 0610 08/07/11 0620    NA 139 139  K 3.5 4.1  CL 106 105  CO2 22 19  GLUCOSE 214* 232*  BUN 38* 52*  CREATININE 1.40* 1.86*  CALCIUM 8.6 8.6  MG -- --  PHOS -- --   Liver Function Tests:  Basename 08/06/11 0847 08/06/11 0501  AST 430* 449*  ALT 675* 655*  ALKPHOS 129* 98  BILITOT 1.2 1.1  PROT 6.3 6.2  ALBUMIN 3.3* 3.3*   No results found for this basename: LIPASE:2,AMYLASE:2 in the last 72 hours CBC:  Basename 08/07/11 0620  WBC 11.0*  NEUTROABS --  HGB 11.8*  HCT 36.1*  MCV 89.1  PLT 169    RADIOLOGY: 1. MI secondary to extensive graft occlusion with late presentation.  2. Occluded SVG to the OM3 with suboptimal reperfusion secondary to extensive clotting.  3. Prior CABG with LIMA to the LAD, SVG to diagonal (known prior occlusion), SVG to OM3 and distally with prior ostial stenting.  4. Preserved LV in past - No LV secondary to elevated creatinine.  Recommendations:  1. Probable elective PCI of ramus intermedius when patient stabilizes.     PHYSICAL EXAM  Filed Vitals:   08/08/11 0400 08/08/11 0500 08/08/11 0600 08/08/11 0700  BP: 142/76 146/81 138/82 152/92  Pulse: 77 71 67 79  Temp: 97.5 F (36.4 C)     TempSrc: Oral     Resp:      Height:      Weight:  185 lb 6.5 oz (84.1 kg)    SpO2: 92% 91% 94% 92%   BP 152/92  Pulse 79  Temp(Src) 97.5 F (36.4 C) (Oral)  Resp 18  Ht 5\' 9"  (1.753 m)  Wt 185 lb 6.5 oz (84.1 kg)  BMI 27.38 kg/m2  SpO2 92%  Intake/Output Summary (Last 24 hours) at 08/08/11 1136 Last data filed at 08/08/11 0953  Gross per 24 hour  Intake   1053 ml  Output   1050 ml  Net      3 ml   I/O last 3 completed shifts: In: 1333 [P.O.:1230; I.V.:103] Out: 1900 [Urine:1900] General: well- nourished. No distress HEENT: normal carotid upstroke. Normal JVP. No thyromegaly Cardiac: RRR, NL S1/S2.II/-III/VI LLSB murmur Lungs: clear BS bilaterally, no wheezing. Abdomen, soft, non- tender. No rebound /guarding Extremities.  No edema. Normal distal  pulses Skin: warm and dry Psychologic: normal affect.   TELEMETRY:NSR. Wenckebach, Junctional rhythm improved.  ASSESSMENT   1. STEMI (ST elevation myocardial infarction) -late presentation EF45-50% posterior AK- conservative therapy.   2. Hyperglycemia - per IM  3 4. 5 6 7 8  Abnormal LFTs elevated AST /ALT per GI HTN Murmur- mild MR by ECHO  Renal Insufficiency- Cr. 1.4- further improved Rhythm abnormalities- stable. NSR    PLAN  Gi evaluation- no evidence for galbladder disease-no evidence/ steato hepatosis ? DM.  Apparently no plans for elective PCI.   General medicine to see for DM  Renal function improved and still HTN- slowly initiate ACEI while in hospital. I suspect should be safe as long as we closely monitor. Diuretic not ideal.  Pain management. Fu renal function and LFTs.- will orse for AM PT.  Need to review ECHO because suspect valvular disease more significant then reported by 2 DECHO.Will try to review later   BS under better control.   Alvin Critchley [facility] 08/08/2011, 11:36 AM

## 2011-08-09 ENCOUNTER — Inpatient Hospital Stay (HOSPITAL_COMMUNITY): Payer: Medicare Other

## 2011-08-09 DIAGNOSIS — R7989 Other specified abnormal findings of blood chemistry: Secondary | ICD-10-CM

## 2011-08-09 DIAGNOSIS — I498 Other specified cardiac arrhythmias: Secondary | ICD-10-CM

## 2011-08-09 DIAGNOSIS — I219 Acute myocardial infarction, unspecified: Secondary | ICD-10-CM | POA: Diagnosis not present

## 2011-08-09 DIAGNOSIS — E860 Dehydration: Secondary | ICD-10-CM | POA: Diagnosis not present

## 2011-08-09 DIAGNOSIS — E119 Type 2 diabetes mellitus without complications: Secondary | ICD-10-CM

## 2011-08-09 DIAGNOSIS — E872 Acidosis: Secondary | ICD-10-CM | POA: Diagnosis not present

## 2011-08-09 LAB — GLUCOSE, CAPILLARY
Glucose-Capillary: 175 mg/dL — ABNORMAL HIGH (ref 70–99)
Glucose-Capillary: 126 mg/dL — ABNORMAL HIGH (ref 70–99)
Glucose-Capillary: 232 mg/dL — ABNORMAL HIGH (ref 70–99)
Glucose-Capillary: 199 mg/dL — ABNORMAL HIGH (ref 70–99)
Glucose-Capillary: 198 mg/dL — ABNORMAL HIGH (ref 70–99)

## 2011-08-09 MED ORDER — INSULIN GLARGINE 100 UNIT/ML ~~LOC~~ SOLN
20.0000 [IU] | Freq: Every day | SUBCUTANEOUS | Status: DC
  Administered 2011-08-09: 20 [IU] via SUBCUTANEOUS

## 2011-08-09 MED ORDER — INSULIN ASPART 100 UNIT/ML ~~LOC~~ SOLN: 0.0000 [IU] | Freq: Every day | SUBCUTANEOUS | Status: DC

## 2011-08-09 MED ORDER — INSULIN ASPART 100 UNIT/ML ~~LOC~~ SOLN
0.0000 [IU] | Freq: Three times a day (TID) | SUBCUTANEOUS | Status: DC
  Administered 2011-08-09: 4 [IU] via SUBCUTANEOUS
  Administered 2011-08-10: 3 [IU] via SUBCUTANEOUS
  Administered 2011-08-10 (×2): 4 [IU] via SUBCUTANEOUS
  Administered 2011-08-11: 3 [IU] via SUBCUTANEOUS
  Administered 2011-08-11: 4 [IU] via SUBCUTANEOUS

## 2011-08-09 NOTE — Consult Note (Addendum)
[facility] - CONSULT F/U NOTE [place] TEAM 1 - Stepdown/ICU TEAM  Reason for the consult: Management of diabetes  Subjective: 78 y.o. male with history of diabetes on metformin and Amaryl, moderate aortic insufficiency, hypertension, hypothyroidism, history of right bundle branch block with left anterior fascicular block, having diarrhea for several days prior to being admitted for an acute MI.   This morning the pt is resting comfortably.  He denies f/c, sob, n/v, or abdom pain. I had the pleasure of speaking with his wife and daughter at the bedside, and was able to answer a number of questions for them.    Objective: Weight change: 1 kg (2 lb 3.3 oz)  Intake/Output Summary (Last 24 hours) at 08/09/11 1417 Last data filed at 08/09/11 1054  Gross per 24 hour  Intake   1003 ml  Output   1500 ml  Net   -497 ml   Blood pressure 138/85, pulse 89, temperature 97.6 F (36.4 C), temperature source Oral, resp. rate 18, height 5\' 9"  (1.753 m), weight 85.1 kg (187 lb 9.8 oz), SpO2 96.00%.  CBG (last 3)   Basename 08/09/11 1206 08/09/11 0810 08/08/11 2151  GLUCAP 232* 126* 175*   Physical Exam: General: No acute respiratory distress Lungs: Clear to auscultation bilaterally without wheezes or crackles Cardiovascular: Regular rate and rhythm without gallop or rub  Abdomen: Nontender, nondistended, soft, bowel sounds positive, no rebound, no ascites, no appreciable mass Extremities: No significant cyanosis, clubbing or edema bilateral lower extremities  Lab Results:  [facility] 08/08/11 0610 08/07/11 0620  NA 139 139  K 3.5 4.1  CL 106 105  CO2 22 19  GLUCOSE 214* 232*  BUN 38* 52*  CREATININE 1.40* 1.86*  CALCIUM 8.6 8.6  MG -- --  PHOS -- --    Basename 08/08/11 1651  AST 145*  ALT 405*  ALKPHOS 185*  BILITOT 1.1  PROT 6.2  ALBUMIN 3.0*    Basename 08/07/11 0620  WBC 11.0*  NEUTROABS --  HGB 11.8*  HCT 36.1*  MCV 89.1  PLT 169   Micro Results: Recent  Results (from the past 240 hour(s))  MRSA PCR SCREENING     Status: Normal   Collection Time   08/03/11  7:27 PM      Component Value Range Status Comment   MRSA by PCR NEGATIVE  NEGATIVE  Final     Studies/Results: All recent x-ray/radiology reports have been reviewed in detail.   Medications: I have reviewed the patient's complete medication list.  Recommendations:  DM Poorly controlled - primary team is considering resuming amaryl - would not yet begin Amaryl due to propensity for refractory hypoglycemia in setting of renal insuff - once GFR improved further, could then safely do so (likely as early as tomorrow) - will adjust SSI for now and follow  Acute MI - CAD w/ hx of CABG 1996 Per primary team (Cardiology) - s/p PTCA and thrombectomy of the SVG->OM  Acute delirium Mental status improved today to my exam  RBBB  Renal insuff Slowly recovering  HTN Reasonably well controlled at this time  Transaminitis/coagulopathy GI has been consulted and is following - felt to be due to ischemia  Hypothyroid Remains on replacement tx  Dyslipidemia Per primary team  Normocytic anemia Hgb holding steady at this time  Dispo We will cont to follow along with you for diabetes management  Lonia Blood, MD [facility] Office  [phone] Pager [phone]  On-Call/Text Page:  [email or web address]      password [email or web address]

## 2011-08-09 NOTE — Progress Notes (Signed)
CARDIAC REHAB PHASE I   PRE:  Rate/Rhythm: 97 SR    BP: sitting 128/78    SaO2: 92 Ra  MODE:  Ambulation: 350 ft   POST:  Rate/Rhythm: 116 irregular (? Many PACs)    BP: sitting 130/67     SaO2: 95 RA  Pt reluctant to walk at first. Did well with assist x2 and RW. Quick pace. To chair after walk. HR up to 116 irregular. No O2 needed. [id number]  Harriet Masson CES, ACSM

## 2011-08-09 NOTE — Progress Notes (Addendum)
Subjective:  Stable.  Less confused last night per nursing, and ready to move to floor.  No chest pain.    Objective:  Vital Signs in the last 24 hours: Temp:  [97.9 F (36.6 C)-98.6 F (37 C)] 97.9 F (36.6 C) (03/25 0400) Pulse Rate:  [72-89] 83  (03/25 0500) BP: (124-157)/(59-96) 139/79 mmHg (03/25 0500) SpO2:  [91 %-95 %] 93 % (03/25 0500) Weight:  [187 lb 9.8 oz (85.1 kg)] 187 lb 9.8 oz (85.1 kg) (03/25 0500)  Intake/Output from previous day: 03/24 0701 - 03/25 0700 In: 1283 [P.O.:1280; I.V.:3] Out: 1575 [Urine:1575]   Physical Exam: General:  Thin older gentleman, in no acute distress. Head:  Normocephalic and atraumatic. Lungs: Clear to auscultation and percussion. Heart: Normal S1 and S2.  2/6 SEM.  No DM.  Abd:  Soft, no tenderness, nor organomegaly.   Pulses: Pulses normal in all 4 extremities. Extremities: No clubbing or cyanosis. No edema. Neurologic: Alert and oriented x 3.    Lab Results:  [facility] 08/07/11 0620  WBC 11.0*  HGB 11.8*  PLT 169    Basename 08/08/11 0610 08/07/11 0620  NA 139 139  K 3.5 4.1  CL 106 105  CO2 22 19  GLUCOSE 214* 232*  BUN 38* 52*  CREATININE 1.40* 1.86*   No results found for this basename: TROPONINI:2,CK,MB:2 in the last 72 hours Hepatic Function Panel  Basename 08/08/11 1651  PROT 6.2  ALBUMIN 3.0*  AST 145*  ALT 405*  ALKPHOS 185*  BILITOT 1.1  BILIDIR 0.3  IBILI 0.8   No results found for this basename: CHOL in the last 72 hours No results found for this basename: PROTIME in the last 72 hours  Imaging: No results found.   Assessment/Plan:  Patient Active Hospital Problem List: Acute MI, initial (08/03/2011)   Assessment: slowly improving   Plan: continue course with transfer to floor.   DM w/o Complication Type II (01/16/2008)   Assessment: glucoses remain elevated   Plan: begin Amaryl back.  Hold on metformin.  Hypertension ()   Assessment: stable at present   Plan: continue current regimen.    RBBB (right bundle branch block with left anterior fascicular block) ()   Assessment: has some av block and junctional beats.     Plan: May eventually need pacing, but defer for now with MI.  Revisit down the road.  No major brady events.   Renal insufficiency (08/03/2011)   Assessment: Cr continues to improved   Plan: monitor.  Abnormal LFTs (08/05/2011)   Assessment: these are better   Plan: continue to observe.   45 minutes to transfer patient through EPIC.  Orders reconciled, problems reviewed, new orders created.         Shawnie Pons, MD, [facility], FSCAI 08/09/2011, 7:44 AM

## 2011-08-09 NOTE — Progress Notes (Signed)
Inpatient Diabetes Program Recommendations  AACE/ADA: New Consensus Statement on Inpatient Glycemic Control (2009)  Target Ranges:  Prepandial:   less than 140 mg/dL      Peak postprandial:   less than 180 mg/dL (1-2 hours)      Critically ill patients:  140 - 180 mg/dL    Inpatient Diabetes Program Recommendations Insulin - Basal: agree with current order Insulin - Meal Coverage: add Novolog 4 units with meals (must eat at least 50% to get meal coverage)  Thank you  Piedad Climes RN,BSN,CDE Inpatient Diabetes Coordinator

## 2011-08-09 NOTE — Progress Notes (Signed)
PT Cancellation Note  Treatment cancelled today due to pt leaving for x-ray per RN.  Will try another time.     Sunny Schlein, [place] [phone] 08/09/2011, 8:29 AM

## 2011-08-10 DIAGNOSIS — E872 Acidosis: Secondary | ICD-10-CM | POA: Diagnosis not present

## 2011-08-10 DIAGNOSIS — E876 Hypokalemia: Secondary | ICD-10-CM | POA: Diagnosis present

## 2011-08-10 DIAGNOSIS — R7989 Other specified abnormal findings of blood chemistry: Secondary | ICD-10-CM | POA: Diagnosis not present

## 2011-08-10 DIAGNOSIS — I219 Acute myocardial infarction, unspecified: Secondary | ICD-10-CM | POA: Diagnosis not present

## 2011-08-10 DIAGNOSIS — E860 Dehydration: Secondary | ICD-10-CM | POA: Diagnosis not present

## 2011-08-10 LAB — CBC
HCT: 36.5 % — ABNORMAL LOW (ref 39.0–52.0)
MCHC: 33.2 g/dL (ref 30.0–36.0)
MCV: 89.5 fL (ref 78.0–100.0)
Platelets: 206 10*3/uL (ref 150–400)
RDW: 14.4 % (ref 11.5–15.5)

## 2011-08-10 LAB — GLUCOSE, CAPILLARY: Glucose-Capillary: 131 mg/dL — ABNORMAL HIGH (ref 70–99)

## 2011-08-10 LAB — HEPATIC FUNCTION PANEL
ALT: 244 U/L — ABNORMAL HIGH (ref 0–53)
Bilirubin, Direct: 0.3 mg/dL (ref 0.0–0.3)
Indirect Bilirubin: 0.8 mg/dL (ref 0.3–0.9)

## 2011-08-10 LAB — BASIC METABOLIC PANEL
BUN: 23 mg/dL (ref 6–23)
Calcium: 8.5 mg/dL (ref 8.4–10.5)
Creatinine, Ser: 1.18 mg/dL (ref 0.50–1.35)
GFR calc Af Amer: 64 mL/min — ABNORMAL LOW (ref >90)
GFR calc non Af Amer: 55 mL/min — ABNORMAL LOW (ref >90)

## 2011-08-10 MED ORDER — POTASSIUM CHLORIDE CRYS ER 20 MEQ PO TBCR
20.0000 meq | EXTENDED_RELEASE_TABLET | Freq: Once | ORAL | Status: AC
  Administered 2011-08-10: 20 meq via ORAL
  Filled 2011-08-10: qty 1

## 2011-08-10 MED ORDER — POTASSIUM CHLORIDE CRYS ER 20 MEQ PO TBCR
40.0000 meq | EXTENDED_RELEASE_TABLET | Freq: Once | ORAL | Status: AC
  Administered 2011-08-10: 40 meq via ORAL
  Filled 2011-08-10: qty 2

## 2011-08-10 MED ORDER — INSULIN GLARGINE 100 UNIT/ML ~~LOC~~ SOLN
10.0000 [IU] | Freq: Every day | SUBCUTANEOUS | Status: DC
  Administered 2011-08-10: 10 [IU] via SUBCUTANEOUS

## 2011-08-10 MED ORDER — GLIMEPIRIDE 1 MG PO TABS
1.0000 mg | ORAL_TABLET | Freq: Every day | ORAL | Status: DC
  Administered 2011-08-10 – 2011-08-11 (×2): 1 mg via ORAL
  Filled 2011-08-10 (×3): qty 1

## 2011-08-10 NOTE — Progress Notes (Signed)
Riggins Cisek, PTA [phone] 08/10/2011  

## 2011-08-10 NOTE — Progress Notes (Signed)
Physical Therapy Treatment Patient Details Name: [name] MRN: [id number] DOB: 24-Jan-1926 Today's Date: 08/10/2011  PT Assessment/Plan  PT - Assessment/Plan Comments on Treatment Session: pt. with much improvement today. supervision/min guard with most activities, Min (A) for stand>sit. Pt impulsive at times. amb ~300 feet, demonstrated good use of walker. pt.reports he prefers to use RW for steadiness but did not use walker prior to admission to hospital.  PT Frequency: Min 3X/week Follow Up Recommendations: Inpatient Rehab;Supervision/Assistance - 24 hour Equipment Recommended: Rolling walker with 5" wheels;3 in 1 bedside comode PT Goals  Acute Rehab PT Goals PT Goal Formulation: With patient Time For Goal Achievement: 7 days PT Goal: Supine/Side to Sit - Progress: Met PT Goal: Sit to Stand - Progress: Met PT Goal: Stand to Sit - Progress: Progressing toward goal PT Goal: Ambulate - Progress: Progressing toward goal  PT Treatment Precautions/Restrictions  Precautions Precautions: Fall Required Braces or Orthoses: No Restrictions Weight Bearing Restrictions: No RLE Weight Bearing: Weight bearing as tolerated LLE Weight Bearing: Weight bearing as tolerated Mobility (including Balance) Bed Mobility Bed Mobility: Yes Supine to Sit: 5: Supervision;HOB flat;With rails Supine to Sit Details (indicate cue type and reason): supervision for safety due to pt. being mod (A) previous session. Minimal use of bed rail. Sitting - Scoot to Edge of Bed: 5: Supervision Sitting - Scoot to [place] of Bed Details (indicate cue type and reason): supervision for safety. cues to scoot closer to EOB for feet contract to floor.  Transfers Transfers: Yes Sit to Stand: 5: Supervision;Other (comment) (Min guard (A)) Sit to Stand Details (indicate cue type and reason): Min guard (A) with initial stand due to safety and pt. being impulsive. Stood for ~3 minutes without walker, Min guard (A), appeared to  be steady.  Stand to Sit: 4: Min assist;With armrests;To bed;To chair/3-in-1;With upper extremity assist Stand to Sit Details: Demonstrated proper hand placement when sitting with walker. Min (A) to control descent. sit><stand x 7 for strengthening and technique. Ambulation/Gait Ambulation/Gait: Yes Ambulation/Gait Assistance: Other (comment) (min guard (A)) Ambulation/Gait Assistance Details (indicate cue type and reason): min guard (A) for safety, pt. appeard to be mildly unsteady with ambulation, pt. reports RW gives him needed support. Also reports no prior walker use to current admission. Amb ~300 feet. Tolerated well. Ambulation Distance (Feet): 300 Feet Assistive device: Rolling walker Gait Pattern: Step-through pattern;Decreased stride length    Exercise  General Exercises - Lower Extremity Hip Flexion/Marching: AROM;Both;5 reps;Standing End of Session PT - End of Session Equipment Utilized During Treatment: Gait belt Activity Tolerance: Patient tolerated treatment well Patient left: in chair;with call bell in reach General Behavior During Session: [facility] for tasks performed Cognition: [facility] for tasks performed  Ardyth Gal SPTA 08/10/2011, 10:57 AM

## 2011-08-10 NOTE — Progress Notes (Signed)
Pt just walked with family 250 ft. With RW. Will f/u in am for further ambulation. Discussed ed with pt and family. Voiced understanding but pt was not interested in CRPII. Spoke with dght and the family will discuss this further with pt as I feel it would benefit him greatly. Pt really did not listen to info re CRPII.  [phone] Ethelda Chick CES, ACSM

## 2011-08-10 NOTE — Progress Notes (Signed)
   CARE MANAGEMENT NOTE 08/10/2011  Patient:  [name], [name]   Account Number:  [id number]  Date Initiated:  08/10/2011  Documentation initiated by:  GRAVES-BIGELOW,Ranard Harte  Subjective/Objective Assessment:   Pt admitted with CP. Plan for home tomorrow with wife. Has support at home along with dme.     Action/Plan:   Pt states he will not need HH services at this time. No further needs assessed at this time.   Anticipated DC Date:  08/11/2011   Anticipated DC Plan:  HOME/SELF CARE      DC Planning Services  CM consult      Choice offered to / List presented to:             Status of service:  Completed, signed off Medicare Important Message given?   (If response is "NO", the following Medicare IM given date fields will be blank) Date Medicare IM given:   Date Additional Medicare IM given:    Discharge Disposition:  HOME/SELF CARE  Per UR Regulation:    If discussed at Long Length of Stay Meetings, dates discussed:    Comments:

## 2011-08-10 NOTE — Progress Notes (Signed)
Consult note  Reason for the consult: Management of diabetes  78 y.o. male with history of diabetes on metformin and Amaryl, moderate aortic insufficiency, hypertension, hypothyroidism, history of right bundle branch block with left anterior fascicular block, having diarrhea for several days prior to being admitted for an acute MI on 08/03/2011.   Subjective: Denies any chest pain or shortness of breath.  Reported that this morning when he had the oxygen off he had some chest pressure sensation which since then has resolved on oxygen.  No specific complaints.  Reported that he was doing well.  Objective: Vital signs in last 24 hours: Filed Vitals:   08/09/11 1206 08/09/11 1315 08/09/11 2102 08/10/11 0504  BP:  149/85 140/90 120/79  Pulse:  96 98 88  Temp: 97.6 F (36.4 C) 97.9 F (36.6 C) 98.4 F (36.9 C) 98.5 F (36.9 C)  TempSrc: Oral Oral Oral Oral  Resp:  18 18 18   Height:      Weight:      SpO2:  97% 98% 96%   Weight change:   Intake/Output Summary (Last 24 hours) at 08/10/11 0912 Last data filed at 08/09/11 2300  Gross per 24 hour  Intake    483 ml  Output    400 ml  Net     83 ml    Physical Exam: General: Awake, Oriented, No acute distress. HEENT: EOMI. Neck: Supple CV: S1 and S2 Lungs: Clear to ascultation bilaterally Abdomen: Soft, Nontender, Nondistended, +bowel sounds. Ext: Good pulses. Trace edema.  Lab Results:  Basename 08/10/11 0540 08/08/11 0610  NA 138 139  K 3.4* 3.5  CL 106 106  CO2 21 22  GLUCOSE 200* 214*  BUN 23 38*  CREATININE 1.18 1.40*  CALCIUM 8.5 8.6  MG -- --  PHOS -- --    Basename 08/10/11 0540 08/08/11 1651  AST 69* 145*  ALT 244* 405*  ALKPHOS 180* 185*  BILITOT 1.1 1.1  PROT 5.7* 6.2  ALBUMIN 2.6* 3.0*   No results found for this basename: LIPASE:2,AMYLASE:2 in the last 72 hours  Basename 08/10/11 0540  WBC 9.7  NEUTROABS --  HGB 12.1*  HCT 36.5*  MCV 89.5  PLT 206   No results found for this basename:  CKTOTAL:3,CKMB:3,CKMBINDEX:3,TROPONINI:3 in the last 72 hours No components found with this basename: POCBNP:3 No results found for this basename: DDIMER:2 in the last 72 hours No results found for this basename: HGBA1C:2 in the last 72 hours No results found for this basename: CHOL:2,HDL:2,LDLCALC:2,TRIG:2,CHOLHDL:2,LDLDIRECT:2 in the last 72 hours No results found for this basename: TSH,T4TOTAL,FREET3,T3FREE,THYROIDAB in the last 72 hours No results found for this basename: VITAMINB12:2,FOLATE:2,FERRITIN:2,TIBC:2,IRON:2,RETICCTPCT:2 in the last 72 hours  Micro Results: Recent Results (from the past 240 hour(s))  MRSA PCR SCREENING     Status: Normal   Collection Time   08/03/11  7:27 PM      Component Value Range Status Comment   MRSA by PCR NEGATIVE  NEGATIVE  Final     Studies/Results: Dg Chest 2 View  08/09/2011  *RADIOLOGY REPORT*  Clinical Data: Myocardial infarction.  CHEST - 2 VIEW  Comparison: 08/06/2011.  Findings: The cardiac silhouette, mediastinal and hilar contours are stable.  Stable surgical changes from bypass surgery.  The lungs show improved aeration with resolution of pulmonary vascular congestion.  No edema, infiltrates or effusions.  Stable degenerative changes involving the thoracic spine.  IMPRESSION: No acute cardiopulmonary findings.  Original Report Authenticated By: P. Loralie Champagne, M.D.    Medications:  I have reviewed the patient's current medications. Scheduled Meds:   . amLODipine  10 mg Oral Daily  . aspirin EC  81 mg Oral Daily  . bimatoprost  1 drop Both Eyes Daily  . cholecalciferol  2,000 Units Oral Daily  . clopidogrel  75 mg Oral Q breakfast  . docusate sodium  200 mg Oral BID  . dorzolamide  1 drop Both Eyes QHS  . feeding supplement  237 mL Oral BID  . insulin aspart  0-20 Units Subcutaneous TID WC  . insulin aspart  0-5 Units Subcutaneous QHS  . insulin glargine  20 Units Subcutaneous QHS  . levothyroxine  25 mcg Oral Q breakfast  .  potassium chloride  20 mEq Oral Once  . sodium chloride  3 mL Intravenous Q12H  . DISCONTD: enoxaparin  40 mg Subcutaneous Q24H  . DISCONTD: insulin aspart  0-15 Units Subcutaneous TID WC  . DISCONTD: insulin aspart  0-5 Units Subcutaneous QHS  . DISCONTD: insulin glargine  16 Units Subcutaneous QHS  . DISCONTD: lisinopril  10 mg Oral Daily  . DISCONTD:  morphine injection  2 mg Intravenous Once   Continuous Infusions:  PRN Meds:.nitroGLYCERIN, ondansetron (ZOFRAN) IV, zolpidem, DISCONTD: acetaminophen, DISCONTD: morphine, DISCONTD: sodium chloride  Assessment/Plan: DM uncontrolled with complications  Improved.  Decrease the dose of Lantus 10 units each bedtime as patient's renal function has improved enough to start the patient on glimepiride.  Continue sliding scale insulin.  Hemoglobin A1c on 08/03/2011 was 6.8 indicating mean blood sugar of 148 prior to admission.    Acute MI - CAD w/ hx of CABG 1996  Per primary team (Cardiology) - s/p PTCA and thrombectomy of the SVG->OM.  Acute delirium  Improved.  RBBB  Stable.  Acute Renal insuff  Improving.    HTN  Stable.    Transaminitis/coagulopathy  GI has been consulted and is following - felt to be due to ischemia, vascular congestion from acute MI and decreased cardiac function.  Hypothyroid  Continue levothyroxin.  Dyslipidemia  Per primary team   Normocytic anemia  Hgb holding steady at this time   Dispo  Pending.  Thanks for the consult.  Continue to follow for diabetes management.    LOS: 7 days  Thressa Shiffer A, MD 08/10/2011, 9:12 AM

## 2011-08-10 NOTE — Progress Notes (Signed)
Patient ID: [name], male   DOB: 1925-08-29, 78 y.o.   MRN: [id number]   SUBJECTIVE: Patient continues to improve after his MI. The vessel could not be opened adequately in the cath lab. He has a wall motion abnormality related to this. Fortunately his mental status is improved. His renal function is continuing to improve. Liver function studies improved. His diabetes is being treated. He ambulated in the hall today. He's not showing any significant signs of CHF.   Filed Vitals:   08/09/11 1206 08/09/11 1315 08/09/11 2102 08/10/11 0504  BP:  149/85 140/90 120/79  Pulse:  96 98 88  Temp: 97.6 F (36.4 C) 97.9 F (36.6 C) 98.4 F (36.9 C) 98.5 F (36.9 C)  TempSrc: Oral Oral Oral Oral  Resp:  18 18 18   Height:      Weight:      SpO2:  97% 98% 96%    Intake/Output Summary (Last 24 hours) at 08/10/11 1352 Last data filed at 08/10/11 0800  Gross per 24 hour  Intake    480 ml  Output    400 ml  Net     80 ml    LABS: Basic Metabolic Panel:  Basename 08/10/11 0540 08/08/11 0610  NA 138 139  K 3.4* 3.5  CL 106 106  CO2 21 22  GLUCOSE 200* 214*  BUN 23 38*  CREATININE 1.18 1.40*  CALCIUM 8.5 8.6  MG -- --  PHOS -- --   Liver Function Tests:  [facility] 08/10/11 0540 08/08/11 1651  AST 69* 145*  ALT 244* 405*  ALKPHOS 180* 185*  BILITOT 1.1 1.1  PROT 5.7* 6.2  ALBUMIN 2.6* 3.0*   No results found for this basename: LIPASE:2,AMYLASE:2 in the last 72 hours CBC:  Basename 08/10/11 0540  WBC 9.7  NEUTROABS --  HGB 12.1*  HCT 36.5*  MCV 89.5  PLT 206   Cardiac Enzymes: No results found for this basename: CKTOTAL:3,CKMB:3,CKMBINDEX:3,TROPONINI:3 in the last 72 hours BNP: No components found with this basename: POCBNP:3 D-Dimer: No results found for this basename: DDIMER:2 in the last 72 hours Hemoglobin A1C: No results found for this basename: HGBA1C in the last 72 hours Fasting Lipid Panel: No results found for this basename:  CHOL,HDL,LDLCALC,TRIG,CHOLHDL,LDLDIRECT in the last 72 hours Thyroid Function Tests: No results found for this basename: TSH,T4TOTAL,FREET3,T3FREE,THYROIDAB in the last 72 hours  RADIOLOGY: Dg Chest 2 View  08/09/2011  *RADIOLOGY REPORT*  Clinical Data: Myocardial infarction.  CHEST - 2 VIEW  Comparison: 08/06/2011.  Findings: The cardiac silhouette, mediastinal and hilar contours are stable.  Stable surgical changes from bypass surgery.  The lungs show improved aeration with resolution of pulmonary vascular congestion.  No edema, infiltrates or effusions.  Stable degenerative changes involving the thoracic spine.  IMPRESSION: No acute cardiopulmonary findings.  Original Report Authenticated By: P. Loralie Champagne, M.D.   US Abdomen Complete  08/06/2011  *RADIOLOGY REPORT*  Clinical Data:  Elevated liver function tests.  Diabetes and hypertension.  ABDOMINAL ULTRASOUND COMPLETE  Comparison:  None.  Findings:  Gallbladder:  No gallstones, gallbladder wall thickening, or pericholecystic fluid.  Common Bile Duct:  Within normal limits in caliber. Measures 4 mm in diameter.  Liver: No focal mass lesion identified.  Within normal limits in parenchymal echogenicity.  IVC:  Appears normal.  Pancreas:  No abnormality identified.  Spleen:  Within normal limits in size and echotexture.  Right kidney:  Normal in size and parenchymal echogenicity.  No evidence of mass or hydronephrosis.  Left kidney:  Normal in size and parenchymal echogenicity.  No evidence of mass or hydronephrosis.  Abdominal Aorta:  No aneurysm identified.  IMPRESSION: Negative abdominal ultrasound.  Original Report Authenticated By: Danae Orleans, M.D.   Dg Chest Port 1 View  08/04/2011  *RADIOLOGY REPORT*  Clinical Data: 78 year old male with weakness, confusion.  PORTABLE CHEST - 1 VIEW  Comparison: 08/03/2011 and earlier.  Findings: Portable semi upright AP view 0943 hours. Stable cardiomegaly and mediastinal contours.  Increased pulmonary  vascular congestion.  No pneumothorax or large effusion.  Increased streaky lung base opacity greater on the left.  No consolidation.  IMPRESSION: Increased vascular congestion and atelectasis.  Original Report Authenticated By: Harley Hallmark, M.D.   Dg Chest Port 1 View  08/03/2011  *RADIOLOGY REPORT*  Clinical Data: Chest pain.  Shortness of breath.  PORTABLE CHEST - 1 VIEW  Comparison: 07/07/2009  Findings: Cardiomegaly and prior CABG noted.  Apical lordotic angulation was employed.  The lungs appear clear.  There is mild atherosclerotic calcification of the aortic arch.  IMPRESSION:  1.  Stable cardiomegaly.  The lungs appear clear. 2.  Atherosclerosis.  Original Report Authenticated By: Dellia Cloud, M.D.   Dg Chest Port 1v Same Day  08/06/2011  *RADIOLOGY REPORT*  Clinical Data: Congestive heart failure  PORTABLE CHEST - 1 VIEW SAME DAY  Comparison: 08/04/2011.  Findings: The patient is status post median sternotomy and CABG.  Cardiomegaly is again identified and is stable in degree. Prominence of the superior mediastinum is stable.  The lung fields demonstrate interval improvement in pulmonary vascular congestion. No overt congestive failure is seen.  Focal alveolar infiltrate is noted at the left lung base and could represent atelectasis although focal alveolar edema foreign pneumonia is not excluded. This appears stable in comparison with prior exam.  No pleural fluid is noted.  IMPRESSION: Improved pulmonary vascular congestion.  Unchanged cardiomegaly and superior mediastinal prominence.  Unchanged left lower lobe infiltrate, question atelectasis versus focal edema fluid with pneumonia not excluded in the appropriate clinical setting  Original Report Authenticated By: Bertha Stakes, M.D.    PHYSICAL EXAM  Patient is oriented to person time and place. Affect is normal. There is no jugulovenous distention. Lungs are clear. Respiratory effort is not labored. Cardiac exam reveals S1 and  S2. There is a systolic murmur. The abdomen is soft. There is no significant peripheral edema. Patient's wife and another family member are in the room.   TELEMETRY: I have reviewed telemetry. There is normal sinus rhythm.   ASSESSMENT AND PLAN:    *Acute MI, initial Patient continues to recover from his acute MI. No further intervention is needed. He can not use a beta blocker because of his bradycardia.    DM w/o Complication Type II Diabetes is being managed very nicely by the internal medicine team. I appreciate the help.   CAD (coronary artery disease)   Hypertension Blood pressure is stable at this time on the current medicines.   Bradycardia  The patient has had significant sinus bradycardia without symptoms as an outpatient. I had stopped his beta blockers as an outpatient. We will continue to watch his rhythm.   RBBB (right bundle branch block with left anterior fascicular block)   Renal insufficiency   His renal function continues to improve very nicely. No further workup.   Abnormal LFTs As of yesterday his labs his LFTs continued to improve. No further workup.   Hypokalemia Potassium is 3.4 today. He is  getting small dose daily. I gave him an extra dose of potassium today and recheck his lab tomorrow.  Overall he's continuing to progress. He wants very much to go home. If he has a good night tonight and he is continuing to ambulate tomorrow morning consideration could be given to discharging him later tomorrow.   Willa Rough 08/10/2011 1:52 PM

## 2011-08-11 DIAGNOSIS — I10 Essential (primary) hypertension: Secondary | ICD-10-CM | POA: Diagnosis not present

## 2011-08-11 DIAGNOSIS — I219 Acute myocardial infarction, unspecified: Secondary | ICD-10-CM | POA: Diagnosis not present

## 2011-08-11 LAB — GLUCOSE, CAPILLARY
Glucose-Capillary: 159 mg/dL — ABNORMAL HIGH (ref 70–99)
Glucose-Capillary: 141 mg/dL — ABNORMAL HIGH (ref 70–99)

## 2011-08-11 LAB — BASIC METABOLIC PANEL
Chloride: 106 mEq/L (ref 96–112)
GFR calc non Af Amer: 54 mL/min — ABNORMAL LOW (ref >90)
Glucose, Bld: 117 mg/dL — ABNORMAL HIGH (ref 70–99)
Potassium: 3.7 mEq/L (ref 3.5–5.1)
Sodium: 139 mEq/L (ref 135–145)

## 2011-08-11 MED ORDER — METFORMIN HCL 500 MG PO TABS
500.0000 mg | ORAL_TABLET | Freq: Two times a day (BID) | ORAL | Status: DC
  Administered 2011-08-11: 500 mg via ORAL
  Filled 2011-08-11 (×2): qty 1

## 2011-08-11 MED ORDER — CLOPIDOGREL BISULFATE 75 MG PO TABS: 75.0000 mg | ORAL_TABLET | Freq: Every day | ORAL | Status: DC

## 2011-08-11 MED ORDER — NITROGLYCERIN 0.4 MG SL SUBL: 0.4000 mg | SUBLINGUAL_TABLET | SUBLINGUAL | Status: DC | PRN

## 2011-08-11 MED ORDER — ASPIRIN 81 MG PO TBEC: 81.0000 mg | DELAYED_RELEASE_TABLET | Freq: Every day | ORAL | Status: DC

## 2011-08-11 MED ORDER — AMLODIPINE BESYLATE 10 MG PO TABS: 10.0000 mg | ORAL_TABLET | Freq: Every day | ORAL | Status: DC

## 2011-08-11 NOTE — Progress Notes (Signed)
08-11-11 1610 Tomi Bamberger, RN,BSN [phone] Pt was discussed in long length of stay meeting.

## 2011-08-11 NOTE — Progress Notes (Addendum)
Consult Progress Note  Reason for the consult: Management of diabetes  78 y.o. male with history of diabetes on metformin and Amaryl, moderate aortic insufficiency, hypertension, hypothyroidism, history of right bundle branch block with left anterior fascicular block, having diarrhea for several days prior to being admitted for an acute MI on 08/03/2011.   Subjective: Patient feels well. Randel Pigg on going home today. Denies any complaints. Had questions regarding his diabetes management which were answered.   Objective: Vital signs in last 24 hours: Filed Vitals:   08/10/11 0504 08/10/11 1400 08/10/11 1953 08/11/11 0453  BP: 120/79 137/83 126/80 134/83  Pulse: 88 88 94 99  Temp: 98.5 F (36.9 C) 98.4 F (36.9 C) 98.5 F (36.9 C) 98.8 F (37.1 C)  TempSrc: Oral Oral Oral Oral  Resp: 18 18 18 18   Height:      Weight:      SpO2: 96% 96% 98% 96%   Weight change:   Intake/Output Summary (Last 24 hours) at 08/11/11 1238 Last data filed at 08/11/11 0900  Gross per 24 hour  Intake    720 ml  Output      0 ml  Net    720 ml    Physical Exam: General: Awake, Oriented, No acute distress. CV: S1 and S2 normal, regular. No murmurs. Lungs: Clear to ascultation bilaterally Abdomen: Soft, Nontender, Nondistended, +bowel sounds. Ext: Good pulses. No edema.  Lab Results:  Basename 08/11/11 0500 08/10/11 0540  NA 139 138  K 3.7 3.4*  CL 106 106  CO2 23 21  GLUCOSE 117* 200*  BUN 21 23  CREATININE 1.21 1.18  CALCIUM 8.8 8.5  MG -- --  PHOS -- --    Basename 08/10/11 0540 08/08/11 1651  AST 69* 145*  ALT 244* 405*  ALKPHOS 180* 185*  BILITOT 1.1 1.1  PROT 5.7* 6.2  ALBUMIN 2.6* 3.0*   No results found for this basename: LIPASE:2,AMYLASE:2 in the last 72 hours  Basename 08/10/11 0540  WBC 9.7  NEUTROABS --  HGB 12.1*  HCT 36.5*  MCV 89.5  PLT 206   Micro Results: Recent Results (from the past 240 hour(s))  MRSA PCR SCREENING     Status: Normal   Collection Time   08/03/11  7:27 PM      Component Value Range Status Comment   MRSA by PCR NEGATIVE  NEGATIVE  Final     Medications: I have reviewed the patient's current medications. Scheduled Meds:    . amLODipine  10 mg Oral Daily  . aspirin EC  81 mg Oral Daily  . bimatoprost  1 drop Both Eyes Daily  . cholecalciferol  2,000 Units Oral Daily  . clopidogrel  75 mg Oral Q breakfast  . docusate sodium  200 mg Oral BID  . dorzolamide  1 drop Both Eyes QHS  . feeding supplement  237 mL Oral BID  . glimepiride  1 mg Oral Q breakfast  . insulin aspart  0-20 Units Subcutaneous TID WC  . insulin aspart  0-5 Units Subcutaneous QHS  . insulin glargine  10 Units Subcutaneous QHS  . levothyroxine  25 mcg Oral Q breakfast  . potassium chloride  40 mEq Oral Once  . sodium chloride  3 mL Intravenous Q12H   Continuous Infusions:  PRN Meds:.nitroGLYCERIN, ondansetron (ZOFRAN) IV, zolpidem  Assessment/Plan: DM2  Improved control. Since his renal failure has resolved and he is tolerating oral diet, will recommend reinstating his oral hypoglycemic agents at home dose. Has been  more than 48hrs since he received contrast dye. Patient has been informed to maintain his oral intake. Patient will not need to go home on Lantus. Hemoglobin A1c on 08/03/2011 was 6.8 indicating mean blood sugar of 148 prior to admission. Patient told to follow up with his PCP for further management.   Acute MI - CAD w/ hx of CABG 1996  Per primary team (Cardiology).   Acute delirium  Improved.  Acute Renal insuff  Resolved    HTN  Stable.    Transaminitis/coagulopathy  GI has been consulted and is following - felt to be due to ischemia, vascular congestion from acute MI and decreased cardiac function.  Dispo  Plan is for discharge soon.   Thanks for the consult.  Will sign off. Please call with any questions.    LOS: 8 days   Osvaldo Shipper, MD  Pager: [phone]  08/11/2011, 12:38 PM

## 2011-08-11 NOTE — Progress Notes (Signed)
PT Cancellation Note  Treatment cancelled today due to pt just back from ambulating with nursing in am and just back to bed in pm.  Awaiting MD to come in and d/c per patient.Merilynn Finland, Marijo Sanes 08/11/2011, 2:15 PM  Newell Coral, PTA Acute Rehab [phone] (office)

## 2011-08-11 NOTE — Progress Notes (Signed)
Patient Name: [name] Date of Encounter: 08/11/2011     Principal Problem:  *Acute MI, initial Active Problems:  DM w/o Complication Type II  CAD (coronary artery disease)  Hypertension  Bradycardia  RBBB (right bundle branch block with left anterior fascicular block)  Renal insufficiency  Abnormal LFTs  Hypokalemia    SUBJECTIVE: In good spirits. Denies chest pain, sob, lightheadedness, palpitations overnight. Ambulated well this AM without incident.    OBJECTIVE  Filed Vitals:   08/10/11 0504 08/10/11 1400 08/10/11 1953 08/11/11 0453  BP: 120/79 137/83 126/80 134/83  Pulse: 88 88 94 99  Temp: 98.5 F (36.9 C) 98.4 F (36.9 C) 98.5 F (36.9 C) 98.8 F (37.1 C)  TempSrc: Oral Oral Oral Oral  Resp: 18 18 18 18   Height:      Weight:      SpO2: 96% 96% 98% 96%    Intake/Output Summary (Last 24 hours) at 08/11/11 1610 Last data filed at 08/11/11 0900  Gross per 24 hour  Intake    720 ml  Output      0 ml  Net    720 ml   Weight change:   PHYSICAL EXAM  General: Lucid, elderly, in no acute distress. Head: Normocephalic, atraumatic, sclera non-icteric, no xanthomas, nares are without discharge.  Neck: Supple without bruits or JVD. Lungs:  Resp regular and unlabored, CTAB without wheezes, rales or rhonchi Heart: Clear S1, S2, II/VI systolic ejection murmur, no s3, s4, or murmurs. Abdomen: Soft, non-tender, non-distended, BS + x 4.  Msk: Strength and tone appears normal for age. Extremities: No clubbing, cyanosis or edema. DP/PT/Radials 2+ and equal bilaterally. Neuro: Alert and oriented X 3. Moves all extremities spontaneously. Psych: Normal affect.  LABS:  Recent Labs  [facility] 08/10/11 0540   WBC 9.7   HGB 12.1*   HCT 36.5*   MCV 89.5   PLT 206    Lab 08/11/11 0500 08/10/11 0540 08/08/11 0610  NA 139 138 139  K 3.7 3.4* 3.5  CL 106 106 106  CO2 23 21 22   BUN 21 23 38*  CREATININE 1.21 1.18 1.40*  CALCIUM 8.8 8.5 8.6  PROT -- 5.7*  --  BILITOT -- 1.1 --  ALKPHOS -- 180* --  ALT -- 244* --  AST -- 69* --  AMYLASE -- -- --  LIPASE -- -- --  GLUCOSE 117* 200* 214*   TELE: NSR, frequent PACs, baseline ST elevation  Radiology/Studies:  Dg Chest 2 View  08/09/2011  *RADIOLOGY REPORT*  Clinical Data: Myocardial infarction.  CHEST - 2 VIEW  Comparison: 08/06/2011.  Findings: The cardiac silhouette, mediastinal and hilar contours are stable.  Stable surgical changes from bypass surgery.  The lungs show improved aeration with resolution of pulmonary vascular congestion.  No edema, infiltrates or effusions.  Stable degenerative changes involving the thoracic spine.  IMPRESSION: No acute cardiopulmonary findings.  Original Report Authenticated By: P. Loralie Champagne, M.D.   US Abdomen Complete  08/06/2011  *RADIOLOGY REPORT*  Clinical Data:  Elevated liver function tests.  Diabetes and hypertension.  ABDOMINAL ULTRASOUND COMPLETE  Comparison:  None.  Findings:  Gallbladder:  No gallstones, gallbladder wall thickening, or pericholecystic fluid.  Common Bile Duct:  Within normal limits in caliber. Measures 4 mm in diameter.  Liver: No focal mass lesion identified.  Within normal limits in parenchymal echogenicity.  IVC:  Appears normal.  Pancreas:  No abnormality identified.  Spleen:  Within normal limits in size and echotexture.  Right kidney:  Normal in size and parenchymal echogenicity.  No evidence of mass or hydronephrosis.  Left kidney:  Normal in size and parenchymal echogenicity.  No evidence of mass or hydronephrosis.  Abdominal Aorta:  No aneurysm identified.  IMPRESSION: Negative abdominal ultrasound.  Original Report Authenticated By: Danae Orleans, M.D.   Dg Chest Port 1 View  08/04/2011  *RADIOLOGY REPORT*  Clinical Data: 78 year old male with weakness, confusion.  PORTABLE CHEST - 1 VIEW  Comparison: 08/03/2011 and earlier.  Findings: Portable semi upright AP view 0943 hours. Stable cardiomegaly and mediastinal contours.   Increased pulmonary vascular congestion.  No pneumothorax or large effusion.  Increased streaky lung base opacity greater on the left.  No consolidation.  IMPRESSION: Increased vascular congestion and atelectasis.  Original Report Authenticated By: Harley Hallmark, M.D.   Dg Chest Port 1 View  08/03/2011  *RADIOLOGY REPORT*  Clinical Data: Chest pain.  Shortness of breath.  PORTABLE CHEST - 1 VIEW  Comparison: 07/07/2009  Findings: Cardiomegaly and prior CABG noted.  Apical lordotic angulation was employed.  The lungs appear clear.  There is mild atherosclerotic calcification of the aortic arch.  IMPRESSION:  1.  Stable cardiomegaly.  The lungs appear clear. 2.  Atherosclerosis.  Original Report Authenticated By: Dellia Cloud, M.D.   Dg Chest Port 1v Same Day  08/06/2011  *RADIOLOGY REPORT*  Clinical Data: Congestive heart failure  PORTABLE CHEST - 1 VIEW SAME DAY  Comparison: 08/04/2011.  Findings: The patient is status post median sternotomy and CABG.  Cardiomegaly is again identified and is stable in degree. Prominence of the superior mediastinum is stable.  The lung fields demonstrate interval improvement in pulmonary vascular congestion. No overt congestive failure is seen.  Focal alveolar infiltrate is noted at the left lung base and could represent atelectasis although focal alveolar edema foreign pneumonia is not excluded. This appears stable in comparison with prior exam.  No pleural fluid is noted.  IMPRESSION: Improved pulmonary vascular congestion.  Unchanged cardiomegaly and superior mediastinal prominence.  Unchanged left lower lobe infiltrate, question atelectasis versus focal edema fluid with pneumonia not excluded in the appropriate clinical setting  Original Report Authenticated By: Bertha Stakes, M.D.    Current Medications:     . amLODipine  10 mg Oral Daily  . aspirin EC  81 mg Oral Daily  . bimatoprost  1 drop Both Eyes Daily  . cholecalciferol  2,000 Units Oral Daily   . clopidogrel  75 mg Oral Q breakfast  . docusate sodium  200 mg Oral BID  . dorzolamide  1 drop Both Eyes QHS  . feeding supplement  237 mL Oral BID  . glimepiride  1 mg Oral Q breakfast  . insulin aspart  0-20 Units Subcutaneous TID WC  . insulin aspart  0-5 Units Subcutaneous QHS  . insulin glargine  10 Units Subcutaneous QHS  . levothyroxine  25 mcg Oral Q breakfast  . potassium chloride  20 mEq Oral Once  . potassium chloride  40 mEq Oral Once  . sodium chloride  3 mL Intravenous Q12H  . DISCONTD: insulin glargine  20 Units Subcutaneous QHS    ASSESSMENT AND PLAN:  1. CAD/Acute posterior STEMI- s/p cardiac catheterization on 03/19 revealing extensive graft occlusion. No need for intervention noted per Dr. Henrietta Hoover rounding note yesterday. Patient denies further chest pain or sob, ambulated well in the halls without incident. No acute events on telemetry.   - Continue ASA/Plavix  - BB not started  secondary to significant bradycardia outpatient  - Continue NTG SL PRN  2. Bradycardia- patient with evidence of AV block and junctional bradycardia/beats noted earlier this admission, rate WNL currently. It was noted that the patient has significant bradycardia outpatient hence the lack of beta blockade.  - Continue to hold BB  3. Hypokalemia- 3.7 this AM  - Continue KCl daily  4. Renal insufficiency- resolved; BUN/CR at 21/1.21 today  5. Elevated LFTs- trending down as of yesterday; likely secondary to ischemic liver per gastroenterology service. No pathology of the gall bladder, hepatic or biliary systems evidenced on abdominal ultrasound on 03/22. Appreciate assessment of GI. No further work-up.   6. Hypertension- well-controlled   - Continue Norvasc  7. Type 2 DM- BGs controlled much better. Appreciate management by medicine service.   8. Hypothyroidism  - Continue Synthroid  Dispo- he has progressed daily and is stable today. He can likely be discharged. Will discuss  with MD.   Signed, R. Hurman Horn, PA-C 08/11/2011, 9:42 AM  He is doing much better and pushing to go home.  He feels much better and is not confused.  We have held off on his beta blocker because of junctional rhythm problems during the hospitalization.  He is now on DAPT.  An attempt to open his SVG was not successful due to excessive thrombus throughout the 76 year old graft, and inability to declot it.  He is now on amlodipine.  I would not start ACE and diuretic at this point.  He should see Dr. Myrtis Ser back within a few days.  He will not need K.  His Cr is now down, and he can probably resume metformin, although I would hold for now and have him recheck sugars at home.  He did get Lantus last night.  I have asked him to check his sugars on a daily basis and notify Dr. Alwyn Ren if this persists.  Starting metformin at home should be associated with checking renal function so he needs to be seen by Dr. Myrtis Ser by early next week.   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    I would continue dual antiplatelet therapy for now.   More than thirty minutes of combined discharge time.    Shawnie Pons 4:47 PM 08/11/2011

## 2011-08-11 NOTE — Progress Notes (Signed)
CARDIAC REHAB PHASE I   PRE:  Rate/Rhythm: 89SR  BP:  Supine:   Sitting: 130/80  Standing:    SaO2: 99%2L  MODE:  Ambulation: 450 ft   POST:  Rate/Rhythem: 97 SR PACs  BP:  Supine:   Sitting: 142/80  Standing:    SaO2: 99%RA 0900-0927 Pt walked 450 ft with rolling walker and asst x 1. Stated he had walker for home use. Gait fairly steady but would need assistance when up. Seems a little unfocused today. Very concerned about having to scale back activities and take it easy. Long discussion with pt about benefits of CRP2 and how this program would help him with getting back to activities safely. Consented to have me send referral to [facility] but unsure if pt will attend. To recliner with call bell. Left off oxygen. Denied CP during walk.  Duanne Limerick

## 2011-08-11 NOTE — Discharge Summary (Signed)
Discharge Summary   Patient ID: [name] MRN: [id number], DOB/AGE: 1926/01/21 78 y.o.  Primary MD: Marga Melnick, MD Primary Cardiologist: Dr. Myrtis Ser  Admit date: 08/03/2011 D/C date:     08/11/2011      Primary Discharge Diagnoses:  1. CAD/Acute posterior STEMI  - 5V CABG 1996, s/p BMS of the SVG to OM '08  - Cardiac cath 08/03/11 revealed an occluded SVG to the OM3 with suboptimal reperfusion secondary to extensive clotting  - No BB 2/2 bradycardia, no statin 2/2 elevated LFTs  - DAPT w/ ASA/Plavix 2. Bradycardia  - H/o bradycardia as outpatient, AV Block & Junctional rhythm this admission  - BB stopped 3. Hypokalemia  - Supplemented K+  - CMET early next week 4. Acute Renal Insufficiency  - Improved, BUN/Crt 21/1.21 on day of discharge  - CMET early next week 5. Elevated LFTs  - Improving, ? 2/2 ischemic liver per GI   - Abd [place] 3/22 without pathology of the gall bladder, hepatic or biliary systems   - CMET early next week 6. Diabetes Mellitus  - A1C 6.8  - Metformin held 2/2 cardiac cath and acute renal insuffiencey  - Will need to check blood sugar daily & get BMET early next week, then f/u w/ PCP regarding Diabetes management. 7. Hyperlipidemia  - H/o myalgias with atorvastatin & pravastatin  - LDL 97, no statin 2/2 elevated LFTs 8. Acute on Chronic Systolic CHF  -  Echocardiogram 08/04/11 mod LVH, EF 45-50%, posterior basal akinesis and abnormal septal motion, mild AI/MR, and mildly dilated LA  - Not on BB 2/2 bradycardia, No ACEI/ARB or Diuretic 2/2 ARF 9. Acute Delirium   - Resolved, ? 2/2 ICU psychosis along with morphine   Secondary Discharge Diagnoses:  1. HTN 2. RBBB w/ LAFB 3. Aortic Insufficiency -  Mild by echo 07/2011 4. Mitral Regurgitation - Mild by echo 07/2011 5. Pulmonary HTN - PASP by echo 07/2011 6. Hypothyroidism 7. Shingles 8. Leg Cramps 9. Dizziness  Allergies Allergies  Allergen Reactions  . Atorvastatin     REACTION:  myalgias  . Folic Acid     REACTION: Face burns  . Nicardipine Hcl     REACTION: Gingival hypertrophy  . Pravastatin     myalgias    Diagnostic Studies/Procedures:  08/03/11 - Cardiac Cath  Hemodynamics:  AO 117/69 (88)  LV not done  Coronary angiography:  Coronary dominance: right  Left mainstem: Calcified but no significant obstruction.  Left anterior descending (LAD): 70 proximal, then 80% leading into first septal, then total occlusion.  The LIMA to the LAD is widely patent filling the LAD to the apex  Left circumflex (LCx): Provides a large ramus with 90% proximal narrowing, progressed from the last study. It then bifurcates, and the larger branch has about 50% segmental narrowing. The AV circumflex is occluded.  The SVG to the OM 3 has been previously stented at the ostium in 2008. The stent is now occluded. After multiple dilatations and thrombectomy, there is extensive thrombus throughout the stent and proximal graft. There was a high grade lesion in the mid to distal graft which was identified by its difficulty to cross, and dogboning of the balloon with inflation at the lesion. This was not well seen, but opened with a 3.0 balloon. After multiple dilatations, and thrombectomy, there remained extensive thrombus throughout the stent and distally, and TIMI 1 flow into the distal graft. Some contrast was seen in the distal circumflex. See procedural notes  for further details.  Right coronary artery (RCA): was not injected but was known to be occluded.  Left ventriculography: the LV was not injected.  PCI Data:  Vessel - SVG to OM3/Segment - 22  Percent Stenosis (pre) 100  TIMI-flow 0  Stent none deployed  Percent Stenosis (post) 80  TIMI-flow (post) 1  Final Conclusions:  1. MI secondary to extensive graft occlusion with late presentation.  2. Occluded SVG to the OM3 with suboptimal reperfusion secondary to extensive clotting.  3. Prior CABG with LIMA to the LAD, SVG to  diagonal (known prior occlusion), SVG to OM3 and distally with prior ostial stenting.  4. Preserved LV in past - No LV secondary to elevated creatinine.  Recommendations:  1. Probable elective PCI of ramus intermedius when patient stabilizes.  08/04/11 - 2D Echocardiogram Study Conclusions: - Left ventricle: Poor image quality. Posterior basal akinesis and abnormal septal motion The cavity size was mildly dilated. Wall thickness was increased in a pattern of moderate LVH. Systolic function was mildly reduced. The estimated ejection fraction was in the range of 45% to 50%. - Aortic valve: Mild regurgitation. - Mitral valve: Mild regurgitation. - Left atrium: The atrium was mildly dilated. - Atrial septum: No defect or patent foramen ovale was identified. - Pulmonary arteries: PA peak pressure: 33mm Hg (S).  08/06/2011 - US Abdomen Findings:  Gallbladder:  No gallstones, gallbladder wall thickening, or pericholecystic fluid.  Common Bile Duct:  Within normal limits in caliber. Measures 4 mm in diameter.  Liver: No focal mass lesion identified.  Within normal limits in parenchymal echogenicity.  IVC:  Appears normal.  Pancreas:  No abnormality identified.  Spleen:  Within normal limits in size and echotexture.  Right kidney:  Normal in size and parenchymal echogenicity.  No evidence of mass or hydronephrosis.  Left kidney:  Normal in size and parenchymal echogenicity.  No evidence of mass or hydronephrosis.  Abdominal Aorta:  No aneurysm identified.  IMPRESSION: Negative abdominal ultrasound.     History of Present Illness: 78 y.o. male w/ the above medical problems who presented to [facility] on 08/03/11 with complaints of chest pain.  He has chronic angina that has been treated medically. The day prior to presentation he c/o diarrhea, nausea, and vomiting. Later that evening he had severe substernal chest pain associated with shortness of breath in addition to the nausea and vomiting that  lasted all night long. When his symptoms did not resolve he went to [facility] emergency room.   Hospital Course: In the ED, EKG revealed NSR, RBBB and ST depressions consistent with posterior MI. CXR was without acute cardiopulmonary abnormalities. poc Troponin significantly elevated at 47.97. Other labs significant for WBC 17.0, K+ 5.6, Crt 1.6, Glucose 366. He was placed on heparin and IV nitroglycerin and transferred emergently to [facility] where he was taken directly to the cath lab.   Cardiac cath with findings as noted above. He tolerated the procedure well without complications. Initially planned for elective PCI of Ramus Intermedius when medically stable, but this was later not felt necessary during this hospitalization. He had some residual chest pain post cath relieved with IV NTG. Telemetry revealed a junctional rhythm for which his BB and clonidine were held. He had initial elevation of BUN/Crt that worsened slightly after cath 2/2 contrast induced nephropathy. ACEI was held and he was gently hydrated. He developed dyspnea with CXR revealing some vascular congestion and volume up on exam for which he received IV lasix  and IVF were stopped. Echocardiogram 08/04/11 mod LVH, EF 45-50%, posterior basal akinesis and abnormal septal motion, mild AI/MR, and mildly dilated LA.  He was initially placed on a statin, but this was held with elevated LFTs. GI evaluated him and obtained abdominal US that was without pathology of the gall bladder, hepatic or biliary systems. It was suspected his abnormal LFTs were 2/2 ischemic liver in the setting of acute MI. He developed metabolic acidosis that was felt to be a result of prior profuse diarrhea aggravated by mild RTA from hypotension.  He also had some confusion the morning after cath that persisted and was felt to be the result of "ICU psychosis" along with morphine administration. His mental status improved and he was able to ambulate with PT/Cardiac Rehab  and participate in care.   He was seen and evaluated by Dr. Riley Kill who felt he was stable for discharge home with plans for follow up as scheduled below. Discharge weight 187lbs (85.1kg).  Discharge Vitals: Blood pressure 134/83, pulse 99, temperature 98.8 F (37.1 C), temperature source Oral, resp. rate 18, height 5\' 9"  (1.753 m), weight 187 lb 9.8 oz (85.1 kg), SpO2 96.00%.  Labs: Component Value Date   WBC 9.7 08/10/2011   HGB 12.1* 08/10/2011   HCT 36.5* 08/10/2011   MCV 89.5 08/10/2011   PLT 206 08/10/2011    Lab 08/11/11 0500 08/10/11 0540  NA 139 --  K 3.7 --  CL 106 --  CO2 23 --  BUN 21 --  CREATININE 1.21 --  CALCIUM 8.8 --  PROT -- 5.7*  BILITOT -- 1.1  ALKPHOS -- 180*  ALT -- 244*  AST -- 69*  GLUCOSE 117* --   Component Value Date   CHOL 158 08/04/2011   HDL 33* 08/04/2011   LDLCALC 97 08/04/2011   TRIG 139 08/04/2011     08/03/2011 13:16 08/03/2011 19:01 08/04/2011 00:32 08/04/2011 06:30  CK, MB  233.9 (HH) 196.7 (HH) 141.9 (HH)  CK Total  5370 (H) 4729 (H) 4335 (H)  Troponin I  >25.00 (HH) >25.00 (HH) >25.00 (HH)  Troponin i, poc 47.97 (HH)       08/05/2011 08:53  Pro B Natriuretic peptide (BNP) 7191.0 (H)    08/03/2011 19:01  Hemoglobin A1C 6.8    Discharge Medications   Medication List  As of 08/11/2011  6:22 PM   STOP taking these medications         acetaminophen 650 MG CR tablet      benazepril 40 MG tablet      carvedilol 3.125 MG tablet      cloNIDine 0.1 MG tablet      metFORMIN 500 MG tablet         TAKE these medications         amLODipine 10 MG tablet   Commonly known as: NORVASC   Take 1 tablet (10 mg total) by mouth daily.      aspirin 81 MG EC tablet   Take 1 tablet (81 mg total) by mouth daily.      cholecalciferol 1000 UNITS tablet   Commonly known as: VITAMIN D   Take 2,000 Units by mouth daily.      clopidogrel 75 MG tablet   Commonly known as: PLAVIX   Take 1 tablet (75 mg total) by mouth daily with breakfast.       dorzolamide 2 % ophthalmic solution   Commonly known as: TRUSOPT   1 drop both eyes in the evening  glimepiride 2 MG tablet   Commonly known as: AMARYL   Take 1 mg by mouth daily before breakfast.      levothyroxine 25 MCG tablet   Commonly known as: SYNTHROID, LEVOTHROID   Take 25 mcg by mouth daily.      LUMIGAN 0.03 % ophthalmic solution   Generic drug: bimatoprost   1 drop both eyes in the am      nitroGLYCERIN 0.4 MG SL tablet   Commonly known as: NITROSTAT   Place 1 tablet (0.4 mg total) under the tongue every 5 (five) minutes x 3 doses as needed for chest pain (up to 3 doses).            Disposition   Discharge Orders    Future Appointments: Provider: Department: Dept Phone: Center:   10/26/2011 11:15 AM Luis Abed, MD [facility] [phone] [facility]     Future Orders Please Complete By Expires   Diet - low sodium heart healthy      Increase activity slowly      Discharge instructions      Comments:   **PLEASE REMEMBER TO BRING ALL OF YOUR MEDICATIONS TO EACH OF YOUR FOLLOW-UP OFFICE VISITS.  * Please check your blood sugars daily. Your metformin was stopped due to decreased kidney function while in the hospital. You will need repeat blood work early next week and will need to f/u with your primary care provider regarding management of your diabetes  * Please do not drive or return to work until after evaluated by Dr. Myrtis Ser  * KEEP GROIN SITE CLEAN AND DRY. Call the office for any signs of bleedings, pus, swelling, increased pain, or any other concerns. * NO HEAVY LIFTING (>10lbs) X 3 WEEKS. * NO SEXUAL ACTIVITY X 3 WEEKS.       Follow-up Information    Follow up with Marga Melnick, MD. Schedule an appointment as soon as possible for a visit in 1 week. (for further management of diabetes)       Follow up with Willa Rough, MD. (Our office will call you with appointment time for early next week)    Contact information:   [facility]  Cardiology [address], [address] [place] [address] [phone]           Outstanding Labs/Studies:  1. CMET early next week   Duration of Discharge Encounter: Greater than 30 minutes including physician and PA time.  Signed, Yaiden Yang PA-C 08/11/2011, 6:22 PM

## 2011-08-12 ENCOUNTER — Telehealth: Payer: Self-pay | Admitting: Cardiology

## 2011-08-12 NOTE — Telephone Encounter (Signed)
error 

## 2011-08-17 ENCOUNTER — Encounter: Payer: Self-pay | Admitting: Cardiology

## 2011-08-17 ENCOUNTER — Ambulatory Visit (INDEPENDENT_AMBULATORY_CARE_PROVIDER_SITE_OTHER): Payer: Medicare Other | Admitting: Cardiology

## 2011-08-17 VITALS — BP 128/81 | HR 83 | Ht 69.0 in | Wt 176.0 lb

## 2011-08-17 DIAGNOSIS — R9430 Abnormal result of cardiovascular function study, unspecified: Secondary | ICD-10-CM | POA: Insufficient documentation

## 2011-08-17 DIAGNOSIS — E876 Hypokalemia: Secondary | ICD-10-CM

## 2011-08-17 DIAGNOSIS — R0602 Shortness of breath: Secondary | ICD-10-CM

## 2011-08-17 DIAGNOSIS — I2510 Atherosclerotic heart disease of native coronary artery without angina pectoris: Secondary | ICD-10-CM | POA: Diagnosis not present

## 2011-08-17 DIAGNOSIS — R945 Abnormal results of liver function studies: Secondary | ICD-10-CM | POA: Insufficient documentation

## 2011-08-17 DIAGNOSIS — E785 Hyperlipidemia, unspecified: Secondary | ICD-10-CM | POA: Diagnosis not present

## 2011-08-17 DIAGNOSIS — IMO0002 Reserved for concepts with insufficient information to code with codable children: Secondary | ICD-10-CM | POA: Insufficient documentation

## 2011-08-17 DIAGNOSIS — R0989 Other specified symptoms and signs involving the circulatory and respiratory systems: Secondary | ICD-10-CM

## 2011-08-17 DIAGNOSIS — N289 Disorder of kidney and ureter, unspecified: Secondary | ICD-10-CM

## 2011-08-17 DIAGNOSIS — R7989 Other specified abnormal findings of blood chemistry: Secondary | ICD-10-CM

## 2011-08-17 LAB — BASIC METABOLIC PANEL
CO2: 24 mEq/L (ref 19–32)
Calcium: 9.1 mg/dL (ref 8.4–10.5)
GFR: 53.15 mL/min — ABNORMAL LOW (ref >60.00)
Potassium: 4.1 mEq/L (ref 3.5–5.1)
Sodium: 137 mEq/L (ref 135–145)

## 2011-08-17 LAB — CBC WITH DIFFERENTIAL/PLATELET
Basophils Absolute: 0 10*3/uL (ref 0.0–0.1)
HCT: 38.6 % — ABNORMAL LOW (ref 39.0–52.0)
Lymphs Abs: 1.4 10*3/uL (ref 0.7–4.0)
Monocytes Relative: 9.4 % (ref 3.0–12.0)
Neutrophils Relative %: 72 % (ref 43.0–77.0)
Platelets: 252 10*3/uL (ref 150.0–400.0)
RDW: 15 % — ABNORMAL HIGH (ref 11.5–14.6)

## 2011-08-17 LAB — HEPATIC FUNCTION PANEL
Alkaline Phosphatase: 118 U/L — ABNORMAL HIGH (ref 39–117)
Bilirubin, Direct: 0 mg/dL (ref 0.0–0.3)
Total Protein: 7.2 g/dL (ref 6.0–8.3)

## 2011-08-17 NOTE — Assessment & Plan Note (Signed)
The patient has had difficulties with statins due to myalgias in the past. With his recent LFT abnormalities this was not pushed in the hospital. His liver functions are being repeated today. I will then communicate with him and make decisions about using a statin.

## 2011-08-17 NOTE — Assessment & Plan Note (Signed)
He's not having any significant shortness of breath.

## 2011-08-17 NOTE — Assessment & Plan Note (Signed)
LFTs are being repeated today.

## 2011-08-17 NOTE — Assessment & Plan Note (Signed)
Chemistry lab is being repeated today.

## 2011-08-17 NOTE — Assessment & Plan Note (Signed)
The patient is stabilizing after his acute MI recently. His hospitalization was complicated by some renal insufficiency and liver function abnormalities. On this basis his statin was held. He has resting bradycardia and therefore his beta blocker has not been continued over time. Overall he is feeling well. I have strongly encouraged him to start the rehabilitation program.

## 2011-08-17 NOTE — Patient Instructions (Signed)
Your physician recommends that you schedule a follow-up appointment in: 6 weeks Your physician recommends that you have lab work drawn today (BMP, CBC, Liver panel)

## 2011-08-17 NOTE — Assessment & Plan Note (Signed)
Chemistry lab is being repeated today. 

## 2011-08-17 NOTE — Progress Notes (Signed)
HPI  The patient is seen for cardiology followup. I had seen him last July 22, 2011. He then had a acute MI. Urgent catheterization revealed occluded vein graft to OM 3. Attempts to open this vessel lead to suboptimal reperfusion with extensive clotting. The patient eventually stabilized. He had confusion in the hospital. He had some renal insufficiency did improve. He had abnormal liver function studies which were improving at the time of discharge.  As part of today's evaluation I have reviewed the extensive hospital records.  Allergies  Allergen Reactions  . Atorvastatin     REACTION: myalgias  . Folic Acid     REACTION: Face burns  . Nicardipine Hcl     REACTION: Gingival hypertrophy  . Pravastatin     myalgias    Current Outpatient Prescriptions  Medication Sig Dispense Refill  . amLODipine (NORVASC) 10 MG tablet Take 1 tablet (10 mg total) by mouth daily.  30 tablet  3  . aspirin 81 MG EC tablet Take 1 tablet (81 mg total) by mouth daily.      . cholecalciferol (VITAMIN D) 1000 UNITS tablet Take 2,000 Units by mouth daily.      . clopidogrel (PLAVIX) 75 MG tablet Take 1 tablet (75 mg total) by mouth daily with breakfast.  30 tablet  6  . dorzolamide (TRUSOPT) 2 % ophthalmic solution 1 drop both eyes in the evening      . glimepiride (AMARYL) 2 MG tablet Take 1 mg by mouth daily before breakfast.      . levothyroxine (SYNTHROID, LEVOTHROID) 25 MCG tablet Take 25 mcg by mouth daily.      Marland Kitchen LUMIGAN 0.03 % ophthalmic solution 1 drop both eyes in the am      . nitroGLYCERIN (NITROSTAT) 0.4 MG SL tablet Place 1 tablet (0.4 mg total) under the tongue every 5 (five) minutes x 3 doses as needed for chest pain (up to 3 doses).  25 tablet  3  . DISCONTD: pravastatin (PRAVACHOL) 20 MG tablet Take 20 mg by mouth daily.          History   Social History  . Marital Status: Married    Spouse Name: N/A    Number of Children: N/A  . Years of Education: N/A   Occupational History  .  Not on file.   Social History Main Topics  . Smoking status: Former Games developer  . Smokeless tobacco: Not on file   Comment: Quit at age 75  . Alcohol Use: No  . Drug Use: No  . Sexually Active: Not on file   Other Topics Concern  . Not on file   Social History Narrative  . No narrative on file    Family History  Problem Relation Age of Onset  . Diabetes    . Coronary artery disease Father     Past Medical History  Diagnosis Date  . CAD (coronary artery disease)     Bare-metal stent to SVG circumflex 2008 / nuclear March, 2012, scar distal anterior, anterolateral and apical, no ischemia  . Hypertension   . Ejection fraction     45%, echo, March, 2012,   /  . Aortic insufficiency     Moderate, echo, March, 2012  . Mitral regurgitation     Moderate, echo, March, 2012  . Pulmonary hypertension     Echo, March, 2012, 42 mmHg  . Hx of CABG   . Diabetes mellitus   . Dyslipidemia   . Hypothyroidism   .  Bradycardia     Sinus bradycardia  . RBBB (right bundle branch block with left anterior fascicular block)     New July, 2010  . Leg cramps     Nighttime  . Shingles     Severe pain to right flank treated  . Dizziness     Mild, August, 2011  . Ischemia     .Marland KitchenMarland KitchenEF 42%  . Hyperlipidemia   . History of colonoscopy 2005  . Dizziness     December, 2012  . Abnormal LFTs     LFTs were abnormal with acute MI March, 2013, improving at the time of discharge    Past Surgical History  Procedure Date  . Coronary artery bypass graft 1996    5 vessels  . Back surgery 1999    LS sx- 1999  . Spinal fusion 2000    scar tissue, plate insertion/fusion @ LS spine - 2000  . Cardiac surgery     ROS Patient denies fever, chills, headache, sweats, rash, change in vision, change in hearing, chest pain, cough, nausea vomiting, urinary symptoms. All other systems are reviewed and are negative.  PHYSICAL EXAM Patient is here with his wife.  He is oriented to person time and place. He  appears mildly depressed. Lungs are clear. Respiratory effort is nonlabored. There is no jugulovenous distention. Cardiac exam reveals S1 and S2. There no clicks. Is a soft systolic murmur. The abdomen is soft. There is no peripheral edema. There no musculoskeletal deformities. There are no skin rashes.  Filed Vitals:   08/17/11 1050  BP: 128/81  Pulse: 83  Height: 5\' 9"  (1.753 m)  Weight: 176 lb (79.833 kg)     ASSESSMENT & PLAN

## 2011-08-19 ENCOUNTER — Encounter: Payer: Self-pay | Admitting: Internal Medicine

## 2011-08-19 ENCOUNTER — Ambulatory Visit (INDEPENDENT_AMBULATORY_CARE_PROVIDER_SITE_OTHER): Payer: Medicare Other | Admitting: Internal Medicine

## 2011-08-19 VITALS — BP 124/80 | HR 90 | Temp 97.7°F | Wt 176.0 lb

## 2011-08-19 DIAGNOSIS — E119 Type 2 diabetes mellitus without complications: Secondary | ICD-10-CM

## 2011-08-19 DIAGNOSIS — E039 Hypothyroidism, unspecified: Secondary | ICD-10-CM | POA: Diagnosis not present

## 2011-08-19 NOTE — Progress Notes (Signed)
  Subjective:    Patient ID: [name], male    DOB: 1926-04-29, 78 y.o.   MRN: [id number]  HPI Dr. Henrietta Hoover office visit 08/17/11 and his summary of the complex hospitalization were reviewed. Additionally labs collected 4/2 were reviewed. His elevated liver function tests have improved dramatically; AST is now normal at 37 and ALT is only mildly elevated at 72. Creatinine remains at 1.4. His anemia is improving; hematocrit 38.6. Glucoses from the hospital ranged from 117 to 287.  Copies of labs provided to him    Review of Systems He has noted increasing urination but denies polydipsia or polyphagia. He has not been checking his sugars since he returned home.  He has been profoundly weak since he returned home     Objective:   Physical Exam  He is in no distress but does appear fatigued  He is no increased work of breathing; chest is dramatically clear without rales rhonchi.  He has an S4 gallop with a flow murmur  No carotid bruits were noted  Pedal pulses are decreased.  He has no edema, cyanosis, clubbing        Assessment & Plan:

## 2011-08-19 NOTE — Assessment & Plan Note (Signed)
A1c will be checked; A1c goal will be less than 8%

## 2011-08-19 NOTE — Patient Instructions (Signed)
Please try to go on My Chart within the next 24 hours to allow me to release the results directly to you.  

## 2011-08-25 NOTE — Discharge Summary (Signed)
Seen and agree.  Case discussed with Dr. Myrtis Ser who will see in follow up.

## 2011-08-25 NOTE — H&P (Signed)
Patient seen and examined.  Data reviewed.  Presents with nausea and vomiting after thinking he had food poisoning.  Data at [facility] suggests Acute MI process, and he has been called a CODE STEMI by the ER doctor at [facility].  Urgent catheterization is recommended with every attempt to eliminate contrast volume given his relative hypovolemia, and elevated Cr.  I am familiar with his case based on prior SVG PCI.

## 2011-09-05 ENCOUNTER — Other Ambulatory Visit: Payer: Self-pay | Admitting: Internal Medicine

## 2011-09-07 ENCOUNTER — Encounter (HOSPITAL_COMMUNITY): Payer: Self-pay

## 2011-09-07 ENCOUNTER — Encounter (HOSPITAL_COMMUNITY)
Admission: RE | Admit: 2011-09-07 | Discharge: 2011-09-07 | Disposition: A | Discharge status: Home / Self Care | Payer: Medicare Other | Source: Ambulatory Visit | Attending: Cardiology | Admitting: Cardiology

## 2011-09-07 DIAGNOSIS — E119 Type 2 diabetes mellitus without complications: Secondary | ICD-10-CM | POA: Insufficient documentation

## 2011-09-07 DIAGNOSIS — I059 Rheumatic mitral valve disease, unspecified: Secondary | ICD-10-CM | POA: Insufficient documentation

## 2011-09-07 DIAGNOSIS — R0602 Shortness of breath: Secondary | ICD-10-CM | POA: Insufficient documentation

## 2011-09-07 DIAGNOSIS — E785 Hyperlipidemia, unspecified: Secondary | ICD-10-CM | POA: Insufficient documentation

## 2011-09-07 DIAGNOSIS — I252 Old myocardial infarction: Secondary | ICD-10-CM | POA: Insufficient documentation

## 2011-09-07 DIAGNOSIS — I2789 Other specified pulmonary heart diseases: Secondary | ICD-10-CM | POA: Insufficient documentation

## 2011-09-07 DIAGNOSIS — I25810 Atherosclerosis of coronary artery bypass graft(s) without angina pectoris: Secondary | ICD-10-CM | POA: Insufficient documentation

## 2011-09-07 DIAGNOSIS — Z951 Presence of aortocoronary bypass graft: Secondary | ICD-10-CM | POA: Insufficient documentation

## 2011-09-07 DIAGNOSIS — I10 Essential (primary) hypertension: Secondary | ICD-10-CM | POA: Insufficient documentation

## 2011-09-07 DIAGNOSIS — I2510 Atherosclerotic heart disease of native coronary artery without angina pectoris: Secondary | ICD-10-CM | POA: Insufficient documentation

## 2011-09-07 DIAGNOSIS — Z9861 Coronary angioplasty status: Secondary | ICD-10-CM | POA: Insufficient documentation

## 2011-09-07 DIAGNOSIS — Z5189 Encounter for other specified aftercare: Secondary | ICD-10-CM | POA: Insufficient documentation

## 2011-09-07 NOTE — Patient Instructions (Signed)
Pt has finished orientation and is scheduled to start CR on 09/13/11 at 9:30 am. Pt has been instructed to arrive to class 15 minutes early for scheduled class. Pt has been instructed to wear comfortable clothing and shoes with rubber soles. Pt has been told to take their medications 1 hour prior to coming to class.  If the patient is not going to attend class, he/she has been instructed to call.

## 2011-09-07 NOTE — Progress Notes (Signed)
Patient has been referred to Cardiac Rehab by Dr. Myrtis Ser due to having a heart attack on 08/03/10. During orientation advised patient on arrival and appointment times what to wear, what to do before, during and after exercise. Reviewed attendance and class policy. Talked about inclement weather and class consultation policy. Pt is scheduled to start Cardiac Rehab on 09/13/11 at 9:30 am. Pt was advised to come to class 5 minutes before class starts. He was also given instructions on meeting with the dietician and attending the Family Structure classes. Pt is eager to get started.

## 2011-09-09 ENCOUNTER — Encounter: Payer: Self-pay | Admitting: Cardiology

## 2011-09-13 ENCOUNTER — Encounter (HOSPITAL_COMMUNITY)
Admission: RE | Admit: 2011-09-13 | Discharge: 2011-09-13 | Disposition: A | Discharge status: Home / Self Care | Payer: Medicare Other | Source: Ambulatory Visit | Attending: Cardiology | Admitting: Cardiology

## 2011-09-13 DIAGNOSIS — Z951 Presence of aortocoronary bypass graft: Secondary | ICD-10-CM | POA: Diagnosis not present

## 2011-09-13 DIAGNOSIS — E119 Type 2 diabetes mellitus without complications: Secondary | ICD-10-CM | POA: Diagnosis not present

## 2011-09-13 DIAGNOSIS — Z9861 Coronary angioplasty status: Secondary | ICD-10-CM | POA: Diagnosis not present

## 2011-09-13 DIAGNOSIS — I10 Essential (primary) hypertension: Secondary | ICD-10-CM | POA: Diagnosis not present

## 2011-09-13 DIAGNOSIS — I2510 Atherosclerotic heart disease of native coronary artery without angina pectoris: Secondary | ICD-10-CM | POA: Diagnosis not present

## 2011-09-13 DIAGNOSIS — I059 Rheumatic mitral valve disease, unspecified: Secondary | ICD-10-CM | POA: Diagnosis not present

## 2011-09-13 DIAGNOSIS — E785 Hyperlipidemia, unspecified: Secondary | ICD-10-CM | POA: Diagnosis not present

## 2011-09-13 DIAGNOSIS — I25810 Atherosclerosis of coronary artery bypass graft(s) without angina pectoris: Secondary | ICD-10-CM | POA: Diagnosis not present

## 2011-09-13 DIAGNOSIS — Z5189 Encounter for other specified aftercare: Secondary | ICD-10-CM | POA: Diagnosis not present

## 2011-09-13 DIAGNOSIS — I2789 Other specified pulmonary heart diseases: Secondary | ICD-10-CM | POA: Diagnosis not present

## 2011-09-13 DIAGNOSIS — I252 Old myocardial infarction: Secondary | ICD-10-CM | POA: Diagnosis not present

## 2011-09-13 DIAGNOSIS — R0602 Shortness of breath: Secondary | ICD-10-CM | POA: Diagnosis not present

## 2011-09-15 ENCOUNTER — Encounter (HOSPITAL_COMMUNITY)
Admission: RE | Admit: 2011-09-15 | Discharge: 2011-09-15 | Disposition: A | Discharge status: Home / Self Care | Payer: Medicare Other | Source: Ambulatory Visit | Attending: Cardiology | Admitting: Cardiology

## 2011-09-15 DIAGNOSIS — Z951 Presence of aortocoronary bypass graft: Secondary | ICD-10-CM | POA: Diagnosis not present

## 2011-09-15 DIAGNOSIS — I252 Old myocardial infarction: Secondary | ICD-10-CM | POA: Diagnosis not present

## 2011-09-15 DIAGNOSIS — I059 Rheumatic mitral valve disease, unspecified: Secondary | ICD-10-CM | POA: Insufficient documentation

## 2011-09-15 DIAGNOSIS — I2789 Other specified pulmonary heart diseases: Secondary | ICD-10-CM | POA: Diagnosis not present

## 2011-09-15 DIAGNOSIS — R0602 Shortness of breath: Secondary | ICD-10-CM | POA: Diagnosis not present

## 2011-09-15 DIAGNOSIS — I10 Essential (primary) hypertension: Secondary | ICD-10-CM | POA: Insufficient documentation

## 2011-09-15 DIAGNOSIS — Z9861 Coronary angioplasty status: Secondary | ICD-10-CM | POA: Diagnosis not present

## 2011-09-15 DIAGNOSIS — Z5189 Encounter for other specified aftercare: Secondary | ICD-10-CM | POA: Diagnosis not present

## 2011-09-15 DIAGNOSIS — E785 Hyperlipidemia, unspecified: Secondary | ICD-10-CM | POA: Insufficient documentation

## 2011-09-15 DIAGNOSIS — I25810 Atherosclerosis of coronary artery bypass graft(s) without angina pectoris: Secondary | ICD-10-CM | POA: Diagnosis not present

## 2011-09-15 DIAGNOSIS — I2510 Atherosclerotic heart disease of native coronary artery without angina pectoris: Secondary | ICD-10-CM | POA: Insufficient documentation

## 2011-09-15 DIAGNOSIS — E119 Type 2 diabetes mellitus without complications: Secondary | ICD-10-CM | POA: Insufficient documentation

## 2011-09-17 ENCOUNTER — Encounter (HOSPITAL_COMMUNITY)
Admission: RE | Admit: 2011-09-17 | Discharge: 2011-09-17 | Disposition: A | Discharge status: Home / Self Care | Payer: Medicare Other | Source: Ambulatory Visit | Attending: Cardiology | Admitting: Cardiology

## 2011-09-20 ENCOUNTER — Encounter (HOSPITAL_COMMUNITY): Payer: Medicare Other

## 2011-09-20 ENCOUNTER — Encounter: Payer: Self-pay | Admitting: Cardiology

## 2011-09-20 ENCOUNTER — Ambulatory Visit (INDEPENDENT_AMBULATORY_CARE_PROVIDER_SITE_OTHER): Payer: Medicare Other | Admitting: Cardiology

## 2011-09-20 VITALS — BP 120/70 | HR 80 | Ht 69.0 in | Wt 179.4 lb

## 2011-09-20 DIAGNOSIS — R9430 Abnormal result of cardiovascular function study, unspecified: Secondary | ICD-10-CM

## 2011-09-20 DIAGNOSIS — R0602 Shortness of breath: Secondary | ICD-10-CM

## 2011-09-20 DIAGNOSIS — I10 Essential (primary) hypertension: Secondary | ICD-10-CM | POA: Diagnosis not present

## 2011-09-20 DIAGNOSIS — R001 Bradycardia, unspecified: Secondary | ICD-10-CM

## 2011-09-20 DIAGNOSIS — I2510 Atherosclerotic heart disease of native coronary artery without angina pectoris: Secondary | ICD-10-CM

## 2011-09-20 DIAGNOSIS — I059 Rheumatic mitral valve disease, unspecified: Secondary | ICD-10-CM

## 2011-09-20 DIAGNOSIS — I498 Other specified cardiac arrhythmias: Secondary | ICD-10-CM

## 2011-09-20 DIAGNOSIS — I340 Nonrheumatic mitral (valve) insufficiency: Secondary | ICD-10-CM

## 2011-09-20 DIAGNOSIS — E039 Hypothyroidism, unspecified: Secondary | ICD-10-CM

## 2011-09-20 DIAGNOSIS — R945 Abnormal results of liver function studies: Secondary | ICD-10-CM

## 2011-09-20 DIAGNOSIS — R7989 Other specified abnormal findings of blood chemistry: Secondary | ICD-10-CM

## 2011-09-20 DIAGNOSIS — R0989 Other specified symptoms and signs involving the circulatory and respiratory systems: Secondary | ICD-10-CM

## 2011-09-20 MED ORDER — FUROSEMIDE 20 MG PO TABS: 20.0000 mg | ORAL_TABLET | Freq: Every day | ORAL | Status: DC

## 2011-09-20 NOTE — Assessment & Plan Note (Signed)
Coronary disease is stable. He's not had any chest pain since his event in March, 2013. He is going to cardiac rehabilitation.

## 2011-09-20 NOTE — Assessment & Plan Note (Signed)
His LFTs have normalized. No change in therapy.

## 2011-09-20 NOTE — Patient Instructions (Signed)
Your physician recommends that you schedule a follow-up appointment in: 2-3 weeks  Your physician has recommended you make the following change in your medication: Start Lasix 20mg  daily.  If your weight goes down more than 3#, discontinue the lasix

## 2011-09-20 NOTE — Assessment & Plan Note (Signed)
Heart rate is stable today. No change in therapy.

## 2011-09-20 NOTE — Assessment & Plan Note (Signed)
His valvular disease remained mild by echo in March, 2013. No further workup

## 2011-09-20 NOTE — Assessment & Plan Note (Signed)
Recent TSH was in the normal range. No change in therapy.

## 2011-09-20 NOTE — Progress Notes (Signed)
HPI Patient is seen today to followup coronary artery disease. He had an acute coronary syndrome March, 2013. There was an occluded vein graft to OM 3. There was suboptimal reperfusion with extensive clotting. Eventually he stabilized. He had some confusion in the hospital did improve he had some abnormal LFTs in the hospital did improve. His ejection fraction was in the 40-45% range. He had mild AI and mild MR.   Last August 17, 2011. At that time I checked his liver functions and they had normalized. BUN was 25 and creatinine 1.4. This represented an ongoing stabilization after his creatinine had been as high as 2.0 in the hospital. TSH was normal.  He is going to cardiac rehabilitation. He had some discomfort in his neck that improved. It was probably musculoskeletal. He decided to allow himself to gain some weight on purpose because many of his friends said that he looked thin. He is willing to lose this weight if we feel it necessary. He is mentioning some shortness of breath. This can be both with exertion and when laying down at night. He's not had any edema.     Allergies  Allergen Reactions  . Atorvastatin     REACTION: myalgias  . Folic Acid     REACTION: Face burns  . Nicardipine Hcl     REACTION: Gingival hypertrophy  . Pravastatin     myalgias    Current Outpatient Prescriptions  Medication Sig Dispense Refill  . acetaminophen (TYLENOL) 325 MG tablet Take 650 mg by mouth every 6 (six) hours as needed.      Marland Kitchen amLODipine (NORVASC) 10 MG tablet Take 1 tablet (10 mg total) by mouth daily.  30 tablet  3  . aspirin 81 MG EC tablet Take 325 mg by mouth daily.      . benazepril (LOTENSIN) 40 MG tablet Take 40 mg by mouth daily.      . carvedilol (COREG) 3.125 MG tablet Take 3.125 mg by mouth daily.      . cholecalciferol (VITAMIN D) 1000 UNITS tablet Take 2,000 Units by mouth daily.      . cloNIDine (CATAPRES) 0.1 MG tablet Take 0.1 mg by mouth 2 (two) times daily.      .  clopidogrel (PLAVIX) 75 MG tablet Take 1 tablet (75 mg total) by mouth daily with breakfast.  30 tablet  6  . dorzolamide (TRUSOPT) 2 % ophthalmic solution 1 drop both eyes in the evening      . glimepiride (AMARYL) 2 MG tablet Take 1 mg by mouth daily before breakfast.      . levothyroxine (SYNTHROID, LEVOTHROID) 25 MCG tablet Take 25 mcg by mouth daily.      Marland Kitchen LUMIGAN 0.03 % ophthalmic solution 1 drop both eyes in the am      . metFORMIN (GLUCOPHAGE) 500 MG tablet Take 500 mg by mouth 2 (two) times daily with a meal.      . nitroGLYCERIN (NITROSTAT) 0.4 MG SL tablet Place 1 tablet (0.4 mg total) under the tongue every 5 (five) minutes x 3 doses as needed for chest pain (up to 3 doses).  25 tablet  3  . SYNTHROID 25 MCG tablet TAKE 1 TABLET BY MOUTH DAILY  90 tablet  3  . DISCONTD: pravastatin (PRAVACHOL) 20 MG tablet Take 20 mg by mouth daily.          History   Social History  . Marital Status: Married    Spouse Name: N/A  Number of Children: N/A  . Years of Education: N/A   Occupational History  . Not on file.   Social History Main Topics  . Smoking status: Former Smoker -- 1.0 packs/day for 5 years    Quit date: 05/17/1949  . Smokeless tobacco: Not on file   Comment: Quit at age 43  . Alcohol Use: No  . Drug Use: No  . Sexually Active: Not on file   Other Topics Concern  . Not on file   Social History Narrative  . No narrative on file    Family History  Problem Relation Age of Onset  . Diabetes    . Coronary artery disease Father   . Heart attack Father     Past Medical History  Diagnosis Date  . CAD (coronary artery disease)     Bare-metal stent to SVG circumflex 2008 / nuclear March, 2012, scar distal anterior, anterolateral and apical, no ischemia  . Hypertension   . Ejection fraction     45%, echo, March, 2012,   /  Ejection fraction after his MI March, 2013 is 45%. There is posterior basal akinesis with abnormal septal motion.  . Aortic insufficiency       Moderate, echo, March, 2012  . Mitral regurgitation     Moderate, echo, March, 2012  . Pulmonary hypertension     Echo, March, 2012, 42 mmHg  . Hx of CABG   . Diabetes mellitus   . Dyslipidemia   . Hypothyroidism   . Bradycardia     Sinus bradycardia  . RBBB (right bundle branch block with left anterior fascicular block)     New July, 2010  . Leg cramps     Nighttime  . Shingles     Severe pain to right flank treated  . Dizziness     Mild, August, 2011  . Ischemia     .Marland KitchenMarland KitchenEF 42%  . Hyperlipidemia   . History of colonoscopy 2005  . Dizziness     December, 2012  . Abnormal LFTs     LFTs were abnormal with acute MI March, 2013, improving at the time of discharge  . Myocardial infarction 3/19-27/2013    ARF, elevated LFTs    Past Surgical History  Procedure Date  . Coronary artery bypass graft 1996    5 vessels  . Back surgery 1999    LS sx- 1999  . Spinal fusion 2000    scar tissue, plate insertion/fusion @ [facility] spine - 2000  . Cardiac surgery   . Cardiac catheterization 2013    occluded graft    ROS   Patient denies fever, chills, headache, sweats, rash, change in vision, change in hearing, chest pain, cough, nausea vomiting, urinary symptoms. All other systems are reviewed and are negative.  PHYSICAL EXAM Patient is here with his wife. He is in better spirits today and is more like his usual self. There is no jugular venous distention. There is question of a few basilar rales. There is no respiratory distress. Cardiac exam reveals S1-S2 and a systolic murmur. The abdomen is soft. There is no significant peripheral edema. There are no musculoskeletal deformities. There no skin rashes. Filed Vitals:   09/20/11 1030  BP: 120/70  Pulse: 80  Height: 5\' 9"  (1.753 m)  Weight: 179 lb 6.4 oz (81.375 kg)     ASSESSMENT & PLAN

## 2011-09-20 NOTE — Assessment & Plan Note (Signed)
It is possible that his shortness of breath may represent mild CHF. He does not have edema. He may have a few basilar rales. I will give him a small dose of Lasix and see him back for early followup. He has had some additional decrease in ejection fraction but his EF remains in the 40-45% range. He had an echo around the time of his event. He does not need a followup echo today.

## 2011-09-20 NOTE — Assessment & Plan Note (Signed)
Blood pressure is under better control than usual. No change in therapy.

## 2011-09-22 ENCOUNTER — Encounter (HOSPITAL_COMMUNITY)
Admission: RE | Admit: 2011-09-22 | Discharge: 2011-09-22 | Disposition: A | Discharge status: Home / Self Care | Payer: Medicare Other | Source: Ambulatory Visit | Attending: Cardiology | Admitting: Cardiology

## 2011-09-24 ENCOUNTER — Encounter (HOSPITAL_COMMUNITY)
Admission: RE | Admit: 2011-09-24 | Discharge: 2011-09-24 | Disposition: A | Discharge status: Home / Self Care | Payer: Medicare Other | Source: Ambulatory Visit | Attending: Cardiology | Admitting: Cardiology

## 2011-09-27 ENCOUNTER — Encounter (HOSPITAL_COMMUNITY)
Admission: RE | Admit: 2011-09-27 | Discharge: 2011-09-27 | Disposition: A | Discharge status: Home / Self Care | Payer: Medicare Other | Source: Ambulatory Visit | Attending: Cardiology | Admitting: Cardiology

## 2011-09-29 ENCOUNTER — Encounter (HOSPITAL_COMMUNITY)
Admission: RE | Admit: 2011-09-29 | Discharge: 2011-09-29 | Disposition: A | Discharge status: Home / Self Care | Payer: Medicare Other | Source: Ambulatory Visit | Attending: Cardiology | Admitting: Cardiology

## 2011-10-01 ENCOUNTER — Encounter (HOSPITAL_COMMUNITY): Payer: Medicare Other

## 2011-10-04 ENCOUNTER — Encounter (HOSPITAL_COMMUNITY)
Admission: RE | Admit: 2011-10-04 | Discharge: 2011-10-04 | Disposition: A | Discharge status: Home / Self Care | Payer: Medicare Other | Source: Ambulatory Visit | Attending: Cardiology | Admitting: Cardiology

## 2011-10-06 ENCOUNTER — Encounter: Payer: Self-pay | Admitting: Cardiology

## 2011-10-06 ENCOUNTER — Encounter (HOSPITAL_COMMUNITY): Payer: Medicare Other

## 2011-10-06 ENCOUNTER — Ambulatory Visit (INDEPENDENT_AMBULATORY_CARE_PROVIDER_SITE_OTHER): Payer: Medicare Other | Admitting: Cardiology

## 2011-10-06 VITALS — BP 126/74 | HR 65 | Ht 69.0 in | Wt 172.0 lb

## 2011-10-06 DIAGNOSIS — I2510 Atherosclerotic heart disease of native coronary artery without angina pectoris: Secondary | ICD-10-CM

## 2011-10-06 DIAGNOSIS — I340 Nonrheumatic mitral (valve) insufficiency: Secondary | ICD-10-CM

## 2011-10-06 DIAGNOSIS — R0602 Shortness of breath: Secondary | ICD-10-CM

## 2011-10-06 DIAGNOSIS — I059 Rheumatic mitral valve disease, unspecified: Secondary | ICD-10-CM | POA: Diagnosis not present

## 2011-10-06 DIAGNOSIS — N289 Disorder of kidney and ureter, unspecified: Secondary | ICD-10-CM | POA: Diagnosis not present

## 2011-10-06 LAB — BASIC METABOLIC PANEL
CO2: 21 mEq/L (ref 19–32)
Glucose, Bld: 142 mg/dL — ABNORMAL HIGH (ref 70–99)
Potassium: 4.4 mEq/L (ref 3.5–5.1)
Sodium: 142 mEq/L (ref 135–145)

## 2011-10-06 NOTE — Progress Notes (Signed)
HPI Patient seen today to followup coronary disease and shortness of breath.I saw him last me to 6, 2013. He had an acute coronary syndrome in March, 2013. There was an occluded vein graft to OM 3. There was suboptimal reperfusion with extensive clotting. Eventually he stabilized. Ejection fraction remains in the 40-45% range. When I saw him last he still had some shortness of breath but he was mildly volume overloaded. I started him on Lasix 20 mg daily. His weight is down 7 pounds. I suspected a good bit of this is fluid. His breathing is better. He's feeling well in general.  Allergies  Allergen Reactions  . Atorvastatin     REACTION: myalgias  . Folic Acid     REACTION: Face burns  . Nicardipine Hcl     REACTION: Gingival hypertrophy  . Pravastatin     myalgias    Current Outpatient Prescriptions  Medication Sig Dispense Refill  . acetaminophen (TYLENOL) 325 MG tablet Take 650 mg by mouth every 6 (six) hours as needed.      Marland Kitchen amLODipine (NORVASC) 10 MG tablet Take 1 tablet (10 mg total) by mouth daily.  30 tablet  3  . aspirin 81 MG EC tablet Take 325 mg by mouth daily.      . benazepril (LOTENSIN) 40 MG tablet Take 40 mg by mouth daily.      . carvedilol (COREG) 3.125 MG tablet Take 3.125 mg by mouth daily.      . cholecalciferol (VITAMIN D) 1000 UNITS tablet Take 2,000 Units by mouth daily.      . cloNIDine (CATAPRES) 0.1 MG tablet Take 0.1 mg by mouth 2 (two) times daily.      . clopidogrel (PLAVIX) 75 MG tablet Take 1 tablet (75 mg total) by mouth daily with breakfast.  30 tablet  6  . dorzolamide (TRUSOPT) 2 % ophthalmic solution 1 drop both eyes in the evening      . furosemide (LASIX) 20 MG tablet Take 1 tablet (20 mg total) by mouth daily.  30 tablet  11  . glimepiride (AMARYL) 2 MG tablet Take 1 mg by mouth daily before breakfast.      . levothyroxine (SYNTHROID, LEVOTHROID) 25 MCG tablet Take 25 mcg by mouth daily.      Marland Kitchen LUMIGAN 0.03 % ophthalmic solution 1 drop both  eyes in the am      . metFORMIN (GLUCOPHAGE) 500 MG tablet Take 500 mg by mouth 2 (two) times daily with a meal.      . nitroGLYCERIN (NITROSTAT) 0.4 MG SL tablet Place 1 tablet (0.4 mg total) under the tongue every 5 (five) minutes x 3 doses as needed for chest pain (up to 3 doses).  25 tablet  3  . SYNTHROID 25 MCG tablet TAKE 1 TABLET BY MOUTH DAILY  90 tablet  3  . DISCONTD: pravastatin (PRAVACHOL) 20 MG tablet Take 20 mg by mouth daily.          History   Social History  . Marital Status: Married    Spouse Name: N/A    Number of Children: N/A  . Years of Education: N/A   Occupational History  . Not on file.   Social History Main Topics  . Smoking status: Former Smoker -- 1.0 packs/day for 5 years    Quit date: 05/17/1949  . Smokeless tobacco: Not on file   Comment: Quit at age 16  . Alcohol Use: No  . Drug Use: No  .  Sexually Active: Not on file   Other Topics Concern  . Not on file   Social History Narrative  . No narrative on file    Family History  Problem Relation Age of Onset  . Diabetes    . Coronary artery disease Father   . Heart attack Father     Past Medical History  Diagnosis Date  . CAD (coronary artery disease)     Bare-metal stent to SVG circumflex 2008 / nuclear March, 2012, scar distal anterior, anterolateral and apical, no ischemia  . Hypertension   . Ejection fraction     45%, echo, March, 2012,   /  Ejection fraction after his MI March, 2013 is 45%. There is posterior basal akinesis with abnormal septal motion.  . Aortic insufficiency     Moderate, echo, March, 2012  . Mitral regurgitation     Moderate, echo, March, 2012  . Pulmonary hypertension     Echo, March, 2012, 42 mmHg  . Hx of CABG   . Diabetes mellitus   . Dyslipidemia   . Hypothyroidism   . Bradycardia     Sinus bradycardia  . RBBB (right bundle branch block with left anterior fascicular block)     New July, 2010  . Leg cramps     Nighttime  . Shingles     Severe pain  to right flank treated  . Dizziness     Mild, August, 2011  . Ischemia     .Marland KitchenMarland KitchenEF 42%  . Hyperlipidemia   . History of colonoscopy 2005  . Dizziness     December, 2012  . Abnormal LFTs     LFTs were abnormal with acute MI March, 2013, improving at the time of discharge  . Myocardial infarction 3/19-27/2013    ARF, elevated LFTs    Past Surgical History  Procedure Date  . Coronary artery bypass graft 1996    5 vessels  . Back surgery 1999    LS sx- 1999  . Spinal fusion 2000    scar tissue, plate insertion/fusion @ LS spine - 2000  . Cardiac surgery   . Cardiac catheterization 2013    occluded graft    ROS Patient denies fever, chills, headache, sweats, rash, change in vision, change in hearing, chest pain, cough, nausea vomiting, urinary symptoms. All other systems are reviewed and are negative.  PHYSICAL EXAM  Patient is stable. He's here with his wife. There is no jugulovenous distention. Lungs are clear. Respiratory effort is nonlabored. Cardiac exam reveals S1 and S2. There no clicks or significant murmurs. The abdomen is soft. There is trace peripheral edema.  Filed Vitals:   10/06/11 1358  BP: 126/74  Pulse: 65  Height: 5\' 9"  (1.753 m)  Weight: 172 lb (78.019 kg)    ASSESSMENT & PLAN

## 2011-10-06 NOTE — Assessment & Plan Note (Signed)
Coronary disease is stable at this time. He continues to recover. No change in therapy.

## 2011-10-06 NOTE — Assessment & Plan Note (Signed)
Renal function will be checked today. 

## 2011-10-06 NOTE — Assessment & Plan Note (Signed)
His shortness of breath he is improving. He is going to cardiac rehabilitation and he is actually pleased with it. I will keep him on low dose of a diuretic. We will cut it back to 3 days per week. Chemistry will be checked today to be sure about his potassium and his renal function.

## 2011-10-06 NOTE — Patient Instructions (Signed)
Your physician recommends that you schedule a follow-up appointment in: 6 weeks with Dr. Myrtis Ser. Your physician recommends that you return for lab work in:  BMET today. Your physician recommends that you continue on your current medications as directed. Please refer to the Current Medication list given to you today.

## 2011-10-06 NOTE — Assessment & Plan Note (Signed)
His valvular heart disease we followed over time.

## 2011-10-08 ENCOUNTER — Encounter (HOSPITAL_COMMUNITY)
Admission: RE | Admit: 2011-10-08 | Discharge: 2011-10-08 | Disposition: A | Discharge status: Home / Self Care | Payer: Medicare Other | Source: Ambulatory Visit | Attending: Cardiology | Admitting: Cardiology

## 2011-10-11 ENCOUNTER — Encounter (HOSPITAL_COMMUNITY): Payer: Medicare Other

## 2011-10-13 ENCOUNTER — Encounter (HOSPITAL_COMMUNITY)
Admission: RE | Admit: 2011-10-13 | Discharge: 2011-10-13 | Disposition: A | Discharge status: Home / Self Care | Payer: Medicare Other | Source: Ambulatory Visit | Attending: Cardiology | Admitting: Cardiology

## 2011-10-15 ENCOUNTER — Encounter (HOSPITAL_COMMUNITY)
Admission: RE | Admit: 2011-10-15 | Discharge: 2011-10-15 | Disposition: A | Discharge status: Home / Self Care | Payer: Medicare Other | Source: Ambulatory Visit | Attending: Cardiology | Admitting: Cardiology

## 2011-10-18 ENCOUNTER — Encounter (HOSPITAL_COMMUNITY)
Admission: RE | Admit: 2011-10-18 | Discharge: 2011-10-18 | Disposition: A | Discharge status: Home / Self Care | Payer: Medicare Other | Source: Ambulatory Visit | Attending: Cardiology | Admitting: Cardiology

## 2011-10-18 DIAGNOSIS — I2789 Other specified pulmonary heart diseases: Secondary | ICD-10-CM | POA: Insufficient documentation

## 2011-10-18 DIAGNOSIS — R0602 Shortness of breath: Secondary | ICD-10-CM | POA: Insufficient documentation

## 2011-10-18 DIAGNOSIS — I10 Essential (primary) hypertension: Secondary | ICD-10-CM | POA: Insufficient documentation

## 2011-10-18 DIAGNOSIS — E119 Type 2 diabetes mellitus without complications: Secondary | ICD-10-CM | POA: Diagnosis not present

## 2011-10-18 DIAGNOSIS — I059 Rheumatic mitral valve disease, unspecified: Secondary | ICD-10-CM | POA: Diagnosis not present

## 2011-10-18 DIAGNOSIS — Z9861 Coronary angioplasty status: Secondary | ICD-10-CM | POA: Insufficient documentation

## 2011-10-18 DIAGNOSIS — Z5189 Encounter for other specified aftercare: Secondary | ICD-10-CM | POA: Insufficient documentation

## 2011-10-18 DIAGNOSIS — E785 Hyperlipidemia, unspecified: Secondary | ICD-10-CM | POA: Diagnosis not present

## 2011-10-18 DIAGNOSIS — I2510 Atherosclerotic heart disease of native coronary artery without angina pectoris: Secondary | ICD-10-CM | POA: Insufficient documentation

## 2011-10-18 DIAGNOSIS — Z951 Presence of aortocoronary bypass graft: Secondary | ICD-10-CM | POA: Diagnosis not present

## 2011-10-18 DIAGNOSIS — I25810 Atherosclerosis of coronary artery bypass graft(s) without angina pectoris: Secondary | ICD-10-CM | POA: Diagnosis not present

## 2011-10-18 DIAGNOSIS — I252 Old myocardial infarction: Secondary | ICD-10-CM | POA: Diagnosis not present

## 2011-10-20 ENCOUNTER — Encounter (HOSPITAL_COMMUNITY)
Admission: RE | Admit: 2011-10-20 | Discharge: 2011-10-20 | Disposition: A | Discharge status: Home / Self Care | Payer: Medicare Other | Source: Ambulatory Visit | Attending: Cardiology | Admitting: Cardiology

## 2011-10-22 ENCOUNTER — Encounter (HOSPITAL_COMMUNITY): Payer: Medicare Other

## 2011-10-22 ENCOUNTER — Ambulatory Visit (INDEPENDENT_AMBULATORY_CARE_PROVIDER_SITE_OTHER): Payer: Medicare Other | Admitting: Internal Medicine

## 2011-10-22 ENCOUNTER — Encounter: Payer: Self-pay | Admitting: Internal Medicine

## 2011-10-22 VITALS — BP 128/82 | HR 81 | Temp 97.7°F | Wt 177.0 lb

## 2011-10-22 DIAGNOSIS — I2510 Atherosclerotic heart disease of native coronary artery without angina pectoris: Secondary | ICD-10-CM | POA: Diagnosis not present

## 2011-10-22 DIAGNOSIS — J40 Bronchitis, not specified as acute or chronic: Secondary | ICD-10-CM | POA: Insufficient documentation

## 2011-10-22 MED ORDER — AZITHROMYCIN 250 MG PO TABS: ORAL_TABLET | ORAL | Status: AC

## 2011-10-22 NOTE — Progress Notes (Signed)
  Subjective:    Patient ID: [name], male    DOB: [birth year], 78 y.o.   MRN: [id number]  HPI Acute visit 3 weeks history of on and off cough, runny nose. From time to time he coughs abundant yellow sputum. Additionally, he reports that was a started Lasix daily for shortness of breath and edema, he improved, subsequently  Lasix was changed to 3 times a week. Since he decreased Lasix, he has noted increased edema.   Past Medical History  Diagnosis Date  . CAD (coronary artery disease)     Bare-metal stent to SVG circumflex 2008 / nuclear March, 2012, scar distal anterior, anterolateral and apical, no ischemia  . Hypertension   . Ejection fraction     45%, echo, March, 2012,   /  Ejection fraction after his MI March, 2013 is 45%. There is posterior basal akinesis with abnormal septal motion.  . Aortic insufficiency     Moderate, echo, March, 2012  . Mitral regurgitation     Moderate, echo, March, 2012  . Pulmonary hypertension     Echo, March, 2012, 42 mmHg  . Hx of CABG   . Diabetes mellitus   . Dyslipidemia   . Hypothyroidism   . Bradycardia     Sinus bradycardia  . RBBB (right bundle branch block with left anterior fascicular block)     New July, 2010  . Leg cramps     Nighttime  . Shingles     Severe pain to right flank treated  . Dizziness     Mild, August, 2011  . Ischemia     .Marland KitchenMarland KitchenEF 42%  . Hyperlipidemia   . History of colonoscopy 2005  . Dizziness     December, 2012  . Abnormal LFTs     LFTs were abnormal with acute MI March, 2013, improving at the time of discharge  . Myocardial infarction 3/19-27/2013    ARF, elevated LFTs     Review of Systems No fever or chills Shortness of breath is slightly above baseline No chest pain  W/ onset of  symptoms he had some fevers but is currently afebrile    Objective:   Physical Exam General -- alert, well-developed. No apparent distress.  Neck -- 7 cm JVD at 45? HEENT -- TMs normal, throat w/o redness,  face symmetric and not tender to palpation, nose not congested  Lungs -- normal respiratory effort, no intercostal retractions, no accessory muscle use,  decreased breath sounds, few rhonchi Heart-- normal rate,  no murmur,    Extremities--  +/+++ pitting edema, right calf larger than the left, a chronic finding since CABG per patient Psych-- Cognition and judgment appear intact. Alert and cooperative with normal attention span and concentration.  not anxious appearing and not depressed appearing.          Assessment & Plan:

## 2011-10-22 NOTE — Assessment & Plan Note (Signed)
Cough and yellow sputum production for a few weeks, likely bronchitis.  He is also slightly more short of breath than usual. See CAD. See instructions.

## 2011-10-22 NOTE — Assessment & Plan Note (Addendum)
Was started on a diuretic by cardiology for SOB-edema : responded well,  then diuretic was  cut down to 3 times a week and since then the edema has increased. JVD elevation today? Plan: Diuretic daily for 5 days, then every other day. further advise per cards

## 2011-10-22 NOTE — Patient Instructions (Signed)
For cough, take Mucinex DM twice a day as needed  take the antibiotic as prescribed  (Zithromax)  Lasix 20 mg one every day for the next 5 days, then one every other day. Discussed with cardiology long-term use of Lasix.  Call if no better in few days Call anytime if the symptoms are severe, you have high fever, more short of breath

## 2011-10-23 ENCOUNTER — Encounter: Payer: Self-pay | Admitting: Internal Medicine

## 2011-10-25 ENCOUNTER — Encounter (HOSPITAL_COMMUNITY): Payer: Medicare Other

## 2011-10-26 ENCOUNTER — Encounter: Payer: Self-pay | Admitting: Cardiology

## 2011-10-26 ENCOUNTER — Ambulatory Visit (INDEPENDENT_AMBULATORY_CARE_PROVIDER_SITE_OTHER): Payer: Medicare Other | Admitting: Cardiology

## 2011-10-26 VITALS — BP 116/68 | HR 75 | Ht 69.0 in | Wt 178.0 lb

## 2011-10-26 DIAGNOSIS — I2510 Atherosclerotic heart disease of native coronary artery without angina pectoris: Secondary | ICD-10-CM | POA: Diagnosis not present

## 2011-10-26 DIAGNOSIS — R0602 Shortness of breath: Secondary | ICD-10-CM

## 2011-10-26 MED ORDER — FUROSEMIDE 40 MG PO TABS: 40.0000 mg | ORAL_TABLET | Freq: Every day | ORAL | Status: DC

## 2011-10-26 NOTE — Progress Notes (Signed)
HPI  Patient is seen to followup coronary disease and CHF. I saw him last on Oct 06, 2011. He had an acute coronary syndrome in March, 2013. His EF remains in the 40-45% range. He had some ongoing shortness of breath with mild volume overload. I started him on 20 mg of Lasix. His weight went down 7 pounds. He was quite stable. I saw in the office on May 22, BUN was 31 with a creatinine 1.5 and potassium 4.4. These labs were stable for him. We have decided that he would decrease his Lasix to 3 days per week.  Since that time he developed some bronchitis and possible mild sinusitis. He is receiving medications for this. He has also had return of some of his edema. His Lasix was resumed back up to 20 mg every day. Despite this he still has some edema. He does have some exertional shortness of breath. He's not having any chest pain.  Allergies  Allergen Reactions  . Atorvastatin     REACTION: myalgias  . Folic Acid     REACTION: Face burns  . Nicardipine Hcl     REACTION: Gingival hypertrophy  . Pravastatin     myalgias    Current Outpatient Prescriptions  Medication Sig Dispense Refill  . acetaminophen (TYLENOL) 325 MG tablet Take 650 mg by mouth every 6 (six) hours as needed.      Marland Kitchen amLODipine (NORVASC) 10 MG tablet Take 1 tablet (10 mg total) by mouth daily.  30 tablet  3  . aspirin 81 MG EC tablet Take 325 mg by mouth daily.      Marland Kitchen azithromycin (ZITHROMAX Z-PAK) 250 MG tablet As directed  6 each  0  . benazepril (LOTENSIN) 40 MG tablet Take 40 mg by mouth daily.      . carvedilol (COREG) 3.125 MG tablet Take 3.125 mg by mouth daily.      . cholecalciferol (VITAMIN D) 1000 UNITS tablet Take 2,000 Units by mouth daily.      . cloNIDine (CATAPRES) 0.1 MG tablet Take 0.1 mg by mouth 2 (two) times daily.      . clopidogrel (PLAVIX) 75 MG tablet Take 1 tablet (75 mg total) by mouth daily with breakfast.  30 tablet  6  . dorzolamide (TRUSOPT) 2 % ophthalmic solution 1 drop both eyes in the  evening      . furosemide (LASIX) 20 MG tablet Take 1 tablet (20 mg total) by mouth daily.  30 tablet  11  . glimepiride (AMARYL) 2 MG tablet Take 1 mg by mouth daily before breakfast.      . levothyroxine (SYNTHROID, LEVOTHROID) 25 MCG tablet Take 25 mcg by mouth daily.      Marland Kitchen LUMIGAN 0.03 % ophthalmic solution 1 drop both eyes in the am      . nitroGLYCERIN (NITROSTAT) 0.4 MG SL tablet Place 1 tablet (0.4 mg total) under the tongue every 5 (five) minutes x 3 doses as needed for chest pain (up to 3 doses).  25 tablet  3  . DISCONTD: pravastatin (PRAVACHOL) 20 MG tablet Take 20 mg by mouth daily.          History   Social History  . Marital Status: Married    Spouse Name: N/A    Number of Children: N/A  . Years of Education: N/A   Occupational History  . Not on file.   Social History Main Topics  . Smoking status: Former Smoker -- 1.0 packs/day for 5  years    Quit date: 05/17/1949  . Smokeless tobacco: Never Used   Comment: Quit at age 63  . Alcohol Use: No  . Drug Use: No  . Sexually Active: Not on file   Other Topics Concern  . Not on file   Social History Narrative  . No narrative on file    Family History  Problem Relation Age of Onset  . Diabetes    . Coronary artery disease Father   . Heart attack Father     Past Medical History  Diagnosis Date  . CAD (coronary artery disease)     Bare-metal stent to SVG circumflex 2008 / nuclear March, 2012, scar distal anterior, anterolateral and apical, no ischemia  . Hypertension   . Ejection fraction     45%, echo, March, 2012,   /  Ejection fraction after his MI March, 2013 is 45%. There is posterior basal akinesis with abnormal septal motion.  . Aortic insufficiency     Moderate, echo, March, 2012  . Mitral regurgitation     Moderate, echo, March, 2012  . Pulmonary hypertension     Echo, March, 2012, 42 mmHg  . Hx of CABG   . Diabetes mellitus   . Dyslipidemia   . Hypothyroidism   . Bradycardia     Sinus  bradycardia  . RBBB (right bundle branch block with left anterior fascicular block)     New July, 2010  . Leg cramps     Nighttime  . Shingles     Severe pain to right flank treated  . Dizziness     Mild, August, 2011  . Ischemia     .Marland KitchenMarland KitchenEF 42%  . Hyperlipidemia   . History of colonoscopy 2005  . Dizziness     December, 2012  . Abnormal LFTs     LFTs were abnormal with acute MI March, 2013, improving at the time of discharge  . Myocardial infarction 3/19-27/2013    ARF, elevated LFTs    Past Surgical History  Procedure Date  . Coronary artery bypass graft 1996    5 vessels  . Back surgery 1999    LS sx- 1999  . Spinal fusion 2000    scar tissue, plate insertion/fusion @ [facility] - 2000  . Cardiac surgery   . Cardiac catheterization 2013    occluded graft    ROS   Patient denies fever, chills, headache, sweats, rash, change in vision, change in hearing, chest pain, nausea vomiting, urinary symptoms. All other systems are reviewed and are negative.  PHYSICAL EXAM   Patient here with his wife. He is oriented to person time and place. Affect is normal. Lungs reveal a few scattered rhonchi and possibly a few rales. Cardiac exam reveals S1 and S2. There are no clicks or significant murmurs. The abdomen is soft. He does have 1+ peripheral edema.  Filed Vitals:   10/26/11 1130  BP: 116/68  Pulse: 75  Height: 5\' 9"  (1.753 m)  Weight: 178 lb (80.74 kg)     ASSESSMENT & PLAN

## 2011-10-26 NOTE — Patient Instructions (Addendum)
Your physician has recommended you make the following change in your medication: Increase your lasix to 40mg  daily  Please call us on Thursday and let us know what your weight is and how you are doing.  [phone] ask for the triage nurse  Your physician recommends that you schedule a follow-up appointment in: Monday or Tuesday

## 2011-10-26 NOTE — Assessment & Plan Note (Signed)
Coronary disease is stable. I do not think that he is having ischemia at this time.

## 2011-10-26 NOTE — Assessment & Plan Note (Signed)
He's had return of shortness of breath. I believe there is a combination of recent bronchitis and some return of fluid overload. We will increase his Lasix to 40 mg daily. We'll call him at home later in the week to be sure that he's diaries in. He'll begin to weigh himself each morning and keep a record. I suspect he will respond to this dose of Lasix. If not we will push higher. I'll plan to see him back in the office next week to be sure that he is responding.

## 2011-10-27 ENCOUNTER — Encounter (HOSPITAL_COMMUNITY): Payer: Medicare Other

## 2011-10-28 DIAGNOSIS — H409 Unspecified glaucoma: Secondary | ICD-10-CM | POA: Diagnosis not present

## 2011-10-28 DIAGNOSIS — H4011X0 Primary open-angle glaucoma, stage unspecified: Secondary | ICD-10-CM | POA: Diagnosis not present

## 2011-10-29 ENCOUNTER — Encounter (HOSPITAL_COMMUNITY)
Admission: RE | Admit: 2011-10-29 | Discharge: 2011-10-29 | Disposition: A | Discharge status: Home / Self Care | Payer: Medicare Other | Source: Ambulatory Visit | Attending: Cardiology | Admitting: Cardiology

## 2011-11-01 ENCOUNTER — Encounter (HOSPITAL_COMMUNITY)
Admission: RE | Admit: 2011-11-01 | Discharge: 2011-11-01 | Disposition: A | Discharge status: Home / Self Care | Payer: Medicare Other | Source: Ambulatory Visit | Attending: Cardiology | Admitting: Cardiology

## 2011-11-02 ENCOUNTER — Encounter: Payer: Self-pay | Admitting: Cardiology

## 2011-11-02 ENCOUNTER — Ambulatory Visit: Payer: Medicare Other | Admitting: Cardiology

## 2011-11-02 ENCOUNTER — Ambulatory Visit (INDEPENDENT_AMBULATORY_CARE_PROVIDER_SITE_OTHER): Payer: Medicare Other | Admitting: Cardiology

## 2011-11-02 VITALS — BP 118/74 | HR 56 | Ht 69.0 in | Wt 178.1 lb

## 2011-11-02 DIAGNOSIS — I5020 Unspecified systolic (congestive) heart failure: Secondary | ICD-10-CM | POA: Diagnosis not present

## 2011-11-02 DIAGNOSIS — R0602 Shortness of breath: Secondary | ICD-10-CM

## 2011-11-02 DIAGNOSIS — I2510 Atherosclerotic heart disease of native coronary artery without angina pectoris: Secondary | ICD-10-CM | POA: Diagnosis not present

## 2011-11-02 NOTE — Assessment & Plan Note (Signed)
Coronary disease is stable. I do not believe his shortness or breath he is an anginal equivalent.

## 2011-11-02 NOTE — Progress Notes (Signed)
HPI  Patient returns today for followup of fluid overload. He has coronary disease and mild left ventricular dysfunction. Recently he has had edema compatible with mild congestive heart failure. I've been trying to adjust his diuretic dose. I been working through his education concerning salt and fluid intake. I discussed these issues today at great length with the patient and his wife. He did admit that he is very careful with his salt at home. He never adds salt. However he does have a good deal and that makes it difficult for him to control his salt. In addition he did mention that lately he has felt thirsty and he has been drinking extra fluids when he feels thirsty. I then had a long discussion with him about the fact that his thirst mechanism is no longer reliable. He must strain: Moderate amounts of fluid during the day and not drink extra especially if he has edema. I had face-to-face discussion with the patient and his wife for greater than 30 minutes.  Allergies  Allergen Reactions  . Atorvastatin     REACTION: myalgias  . Folic Acid     REACTION: Face burns  . Nicardipine Hcl     REACTION: Gingival hypertrophy  . Pravastatin     myalgias    Current Outpatient Prescriptions  Medication Sig Dispense Refill  . acetaminophen (TYLENOL) 325 MG tablet Take 650 mg by mouth every 6 (six) hours as needed.      Marland Kitchen amLODipine (NORVASC) 10 MG tablet Take 10 mg by mouth daily.      Marland Kitchen aspirin 81 MG tablet Take 81 mg by mouth daily.      . benazepril (LOTENSIN) 40 MG tablet Take 40 mg by mouth daily.      . carvedilol (COREG) 3.125 MG tablet Take 3.125 mg by mouth daily.      . cholecalciferol (VITAMIN D) 1000 UNITS tablet Take 2,000 Units by mouth daily.      . clopidogrel (PLAVIX) 75 MG tablet Take 1 tablet (75 mg total) by mouth daily with breakfast.  30 tablet  6  . dorzolamide (TRUSOPT) 2 % ophthalmic solution 1 drop both eyes in the evening      . furosemide (LASIX) 40 MG tablet Take 1  tablet (40 mg total) by mouth daily.  30 tablet  11  . glimepiride (AMARYL) 2 MG tablet Take 1 mg by mouth daily before breakfast.      . levothyroxine (SYNTHROID, LEVOTHROID) 25 MCG tablet Take 25 mcg by mouth daily.      Marland Kitchen LUMIGAN 0.03 % ophthalmic solution 1 drop both eyes in the am      . nitroGLYCERIN (NITROSTAT) 0.4 MG SL tablet Place 1 tablet (0.4 mg total) under the tongue every 5 (five) minutes x 3 doses as needed for chest pain (up to 3 doses).  25 tablet  3  . DISCONTD: amLODipine (NORVASC) 10 MG tablet Take 1 tablet (10 mg total) by mouth daily.  30 tablet  3  . DISCONTD: pravastatin (PRAVACHOL) 20 MG tablet Take 20 mg by mouth daily.          History   Social History  . Marital Status: Married    Spouse Name: N/A    Number of Children: N/A  . Years of Education: N/A   Occupational History  . Not on file.   Social History Main Topics  . Smoking status: Former Smoker -- 1.0 packs/day for 5 years    Quit date: 05/17/1949  .  Smokeless tobacco: Never Used   Comment: Quit at age 18  . Alcohol Use: No  . Drug Use: No  . Sexually Active: Not on file   Other Topics Concern  . Not on file   Social History Narrative  . No narrative on file    Family History  Problem Relation Age of Onset  . Diabetes    . Coronary artery disease Father   . Heart attack Father     Past Medical History  Diagnosis Date  . CAD (coronary artery disease)     Bare-metal stent to SVG circumflex 2008 / nuclear March, 2012, scar distal anterior, anterolateral and apical, no ischemia  . Hypertension   . Ejection fraction     45%, echo, March, 2012,   /  Ejection fraction after his MI March, 2013 is 45%. There is posterior basal akinesis with abnormal septal motion.  . Aortic insufficiency     Moderate, echo, March, 2012  . Mitral regurgitation     Moderate, echo, March, 2012  . Pulmonary hypertension     Echo, March, 2012, 42 mmHg  . Hx of CABG   . Diabetes mellitus   . Dyslipidemia     . Hypothyroidism   . Bradycardia     Sinus bradycardia  . RBBB (right bundle branch block with left anterior fascicular block)     New July, 2010  . Leg cramps     Nighttime  . Shingles     Severe pain to right flank treated  . Dizziness     Mild, August, 2011  . Ischemia     .Marland KitchenMarland KitchenEF 42%  . Hyperlipidemia   . History of colonoscopy 2005  . Dizziness     December, 2012  . Abnormal LFTs     LFTs were abnormal with acute MI March, 2013, improving at the time of discharge  . Myocardial infarction 3/19-27/2013    ARF, elevated LFTs    Past Surgical History  Procedure Date  . Coronary artery bypass graft 1996    5 vessels  . Back surgery 1999    LS sx- 1999  . Spinal fusion 2000    scar tissue, plate insertion/fusion @ [facility] - 2000  . Cardiac surgery   . Cardiac catheterization 2013    occluded graft    ROS   Patient denies fever, chills, headache, sweats, rash, change in vision, change in hearing, chest pain, cough, nausea or vomiting, urinary symptoms. All other systems are reviewed and are negative.  PHYSICAL EXAM   Patient is oriented to person time and place. Affect is normal. He is here with his wife. There is no jugulovenous distention. He does have a few basilar rales. There is no respiratory distress. Cardiac exam reveals S1 and S2. There no clicks or significant murmurs. The abdomen is soft. He has 1+ peripheral edema bilaterally. There are no skin rashes. There no musculoskeletal deformities.  Filed Vitals:   11/02/11 1530  BP: 118/74  Pulse: 56  Height: 5\' 9"  (1.753 m)  Weight: 178 lb 1.9 oz (80.795 kg)     ASSESSMENT & PLAN

## 2011-11-02 NOTE — Assessment & Plan Note (Signed)
I do believe that his current problem is from systolic CHF. Volume is the key issue at this time. There is continued discussion as I have outlined above about watching his salt and fluid intake. Lasix 40 twice a day for 2 days and then return to 40 mg once a day. He will continue to watch his salt intake the best possible. He will also learned to drink fluid and moderations but not extra fluids. I will see him back next week to see how he is doing.

## 2011-11-02 NOTE — Patient Instructions (Addendum)
Your physician recommends that you schedule a follow-up appointment in: next week  Your physician has recommended you make the following change in your medication: Increase your Lasix to 40mg  twice daily for 2 days and then decrease to 40mg  daily  Continue to limit your salt intake and your fluid intake

## 2011-11-02 NOTE — Assessment & Plan Note (Signed)
The patientThe patient continues to have some exertional shortness of breath. I do believe this is on the basis of mild systolic congestive heart failure.

## 2011-11-03 ENCOUNTER — Encounter (HOSPITAL_COMMUNITY)
Admission: RE | Admit: 2011-11-03 | Discharge: 2011-11-03 | Disposition: A | Discharge status: Home / Self Care | Payer: Medicare Other | Source: Ambulatory Visit | Attending: Cardiology | Admitting: Cardiology

## 2011-11-05 ENCOUNTER — Encounter (HOSPITAL_COMMUNITY)
Admission: RE | Admit: 2011-11-05 | Discharge: 2011-11-05 | Disposition: A | Discharge status: Home / Self Care | Payer: Medicare Other | Source: Ambulatory Visit | Attending: Cardiology | Admitting: Cardiology

## 2011-11-08 ENCOUNTER — Encounter (HOSPITAL_COMMUNITY)
Admission: RE | Admit: 2011-11-08 | Discharge: 2011-11-08 | Disposition: A | Discharge status: Home / Self Care | Payer: Medicare Other | Source: Ambulatory Visit | Attending: Cardiology | Admitting: Cardiology

## 2011-11-10 ENCOUNTER — Encounter (HOSPITAL_COMMUNITY): Payer: Medicare Other

## 2011-11-11 ENCOUNTER — Encounter: Payer: Self-pay | Admitting: Cardiology

## 2011-11-11 ENCOUNTER — Ambulatory Visit (INDEPENDENT_AMBULATORY_CARE_PROVIDER_SITE_OTHER): Payer: Medicare Other | Admitting: Cardiology

## 2011-11-11 VITALS — BP 119/68 | HR 77 | Ht 69.0 in | Wt 172.0 lb

## 2011-11-11 DIAGNOSIS — J3489 Other specified disorders of nose and nasal sinuses: Secondary | ICD-10-CM | POA: Insufficient documentation

## 2011-11-11 DIAGNOSIS — I5020 Unspecified systolic (congestive) heart failure: Secondary | ICD-10-CM

## 2011-11-11 DIAGNOSIS — R0602 Shortness of breath: Secondary | ICD-10-CM | POA: Diagnosis not present

## 2011-11-11 DIAGNOSIS — I2510 Atherosclerotic heart disease of native coronary artery without angina pectoris: Secondary | ICD-10-CM

## 2011-11-11 NOTE — Assessment & Plan Note (Signed)
The patient's systolic CHF is now controlled. He has a better understanding of his salt and fluid intake. He is weighing himself daily I described to him taking an extra dose of diuretic if his weight were to move upward. He knows to contact us if it is not stabilize. I'll see him back in 6 weeks.

## 2011-11-11 NOTE — Assessment & Plan Note (Signed)
Patient has some mild sinus drainage. I've recommended a medication such as Claritin. He and his wife know that he should not use a drug such as Claritin D. He does not need an antibiotic.

## 2011-11-11 NOTE — Assessment & Plan Note (Signed)
Shortness of breath is better with his volume down. He still has some mild shortness of breath. He is stable now. No further workup.

## 2011-11-11 NOTE — Patient Instructions (Addendum)
Your physician recommends that you continue on your current medications as directed. Please refer to the Current Medication list given to you today.  Your physician recommends that you schedule a follow-up appointment in: 8 weeks  

## 2011-11-11 NOTE — Progress Notes (Signed)
HPI Patient is seen today for followup of CHF. I saw him last November 02, 2011. I gave him 2 days a higher dose of diuretic. We also carefully discussed that he needs to limit his fluid intake. He's doing much better with this. His weight is down 6 pounds. His breathing is improved. He does remain thirsty but he understands that this is something he will have to get use to.  Allergies  Allergen Reactions  . Atorvastatin     REACTION: myalgias  . Folic Acid     REACTION: Face burns  . Nicardipine Hcl     REACTION: Gingival hypertrophy  . Pravastatin     myalgias    Current Outpatient Prescriptions  Medication Sig Dispense Refill  . acetaminophen (TYLENOL) 325 MG tablet Take 650 mg by mouth every 6 (six) hours as needed.      Marland Kitchen amLODipine (NORVASC) 10 MG tablet Take 10 mg by mouth daily.      Marland Kitchen aspirin 81 MG tablet Take 81 mg by mouth daily.      . benazepril (LOTENSIN) 40 MG tablet Take 40 mg by mouth daily.      . carvedilol (COREG) 3.125 MG tablet Take 3.125 mg by mouth daily.      . cholecalciferol (VITAMIN D) 1000 UNITS tablet Take 2,000 Units by mouth daily.      . clopidogrel (PLAVIX) 75 MG tablet Take 1 tablet (75 mg total) by mouth daily with breakfast.  30 tablet  6  . dorzolamide (TRUSOPT) 2 % ophthalmic solution 1 drop both eyes in the evening      . furosemide (LASIX) 40 MG tablet Take 1 tablet (40 mg total) by mouth daily.  30 tablet  11  . glimepiride (AMARYL) 2 MG tablet Take 1 mg by mouth daily before breakfast.      . levothyroxine (SYNTHROID, LEVOTHROID) 25 MCG tablet Take 25 mcg by mouth daily.      Marland Kitchen LUMIGAN 0.03 % ophthalmic solution 1 drop both eyes in the am      . nitroGLYCERIN (NITROSTAT) 0.4 MG SL tablet Place 1 tablet (0.4 mg total) under the tongue every 5 (five) minutes x 3 doses as needed for chest pain (up to 3 doses).  25 tablet  3  . DISCONTD: pravastatin (PRAVACHOL) 20 MG tablet Take 20 mg by mouth daily.          History   Social History  .  Marital Status: Married    Spouse Name: N/A    Number of Children: N/A  . Years of Education: N/A   Occupational History  . Not on file.   Social History Main Topics  . Smoking status: Former Smoker -- 1.0 packs/day for 5 years    Quit date: 05/17/1949  . Smokeless tobacco: Never Used   Comment: Quit at age 70  . Alcohol Use: No  . Drug Use: No  . Sexually Active: Not on file   Other Topics Concern  . Not on file   Social History Narrative  . No narrative on file    Family History  Problem Relation Age of Onset  . Diabetes    . Coronary artery disease Father   . Heart attack Father     Past Medical History  Diagnosis Date  . CAD (coronary artery disease)     Bare-metal stent to SVG circumflex 2008 / nuclear March, 2012, scar distal anterior, anterolateral and apical, no ischemia  . Hypertension   .  Ejection fraction     45%, echo, March, 2012,   /  Ejection fraction after his MI March, 2013 is 45%. There is posterior basal akinesis with abnormal septal motion.  . Aortic insufficiency     Moderate, echo, March, 2012  . Mitral regurgitation     Moderate, echo, March, 2012  . Pulmonary hypertension     Echo, March, 2012, 42 mmHg  . Hx of CABG   . Diabetes mellitus   . Dyslipidemia   . Hypothyroidism   . Bradycardia     Sinus bradycardia  . RBBB (right bundle branch block with left anterior fascicular block)     New July, 2010  . Leg cramps     Nighttime  . Shingles     Severe pain to right flank treated  . Dizziness     Mild, August, 2011  . Ischemia     .Marland KitchenMarland KitchenEF 42%  . Hyperlipidemia   . History of colonoscopy 2005  . Dizziness     December, 2012  . Abnormal LFTs     LFTs were abnormal with acute MI March, 2013, improving at the time of discharge  . Myocardial infarction 3/19-27/2013    ARF, elevated LFTs  . Systolic CHF     Past Surgical History  Procedure Date  . Coronary artery bypass graft 1996    5 vessels  . Back surgery 1999    LS sx- 1999   . Spinal fusion 2000    scar tissue, plate insertion/fusion @ [facility] - 2000  . Cardiac surgery   . Cardiac catheterization 2013    occluded graft    ROS  Patient denies fever, chills, headache, sweats, rash, change in vision, change in hearing, chest pain, cough, nausea vomiting, urinary symptoms. He does mention some sinus drainage. All other systems are reviewed and are negative.  PHYSICAL EXAM  Filed Vitals:   11/11/11 1045  BP: 119/68  Pulse: 77  Height: 5\' 9"  (1.753 m)  Weight: 172 lb (78.019 kg)    ASSESSMENT & PLAN

## 2011-11-11 NOTE — Assessment & Plan Note (Signed)
Coronary disease is stable. His shortness of breath is not an anginal equivalent. No further workup.

## 2011-11-12 ENCOUNTER — Encounter (HOSPITAL_COMMUNITY)
Admission: RE | Admit: 2011-11-12 | Discharge: 2011-11-12 | Disposition: A | Discharge status: Home / Self Care | Payer: Medicare Other | Source: Ambulatory Visit | Attending: Cardiology | Admitting: Cardiology

## 2011-11-12 ENCOUNTER — Other Ambulatory Visit: Payer: Self-pay | Admitting: Cardiology

## 2011-11-12 MED ORDER — FUROSEMIDE 40 MG PO TABS: 40.0000 mg | ORAL_TABLET | Freq: Every day | ORAL | Status: DC

## 2011-11-15 ENCOUNTER — Telehealth: Payer: Self-pay | Admitting: Cardiology

## 2011-11-15 ENCOUNTER — Encounter (HOSPITAL_COMMUNITY)
Admission: RE | Admit: 2011-11-15 | Discharge: 2011-11-15 | Disposition: A | Discharge status: Home / Self Care | Payer: Medicare Other | Source: Ambulatory Visit | Attending: Cardiology | Admitting: Cardiology

## 2011-11-15 DIAGNOSIS — Z9861 Coronary angioplasty status: Secondary | ICD-10-CM | POA: Diagnosis not present

## 2011-11-15 DIAGNOSIS — I059 Rheumatic mitral valve disease, unspecified: Secondary | ICD-10-CM | POA: Diagnosis not present

## 2011-11-15 DIAGNOSIS — R0602 Shortness of breath: Secondary | ICD-10-CM | POA: Diagnosis not present

## 2011-11-15 DIAGNOSIS — I25810 Atherosclerosis of coronary artery bypass graft(s) without angina pectoris: Secondary | ICD-10-CM | POA: Insufficient documentation

## 2011-11-15 DIAGNOSIS — I2789 Other specified pulmonary heart diseases: Secondary | ICD-10-CM | POA: Diagnosis not present

## 2011-11-15 DIAGNOSIS — I2510 Atherosclerotic heart disease of native coronary artery without angina pectoris: Secondary | ICD-10-CM | POA: Diagnosis not present

## 2011-11-15 DIAGNOSIS — I252 Old myocardial infarction: Secondary | ICD-10-CM | POA: Insufficient documentation

## 2011-11-15 DIAGNOSIS — Z5189 Encounter for other specified aftercare: Secondary | ICD-10-CM | POA: Insufficient documentation

## 2011-11-15 DIAGNOSIS — E785 Hyperlipidemia, unspecified: Secondary | ICD-10-CM | POA: Diagnosis not present

## 2011-11-15 DIAGNOSIS — Z951 Presence of aortocoronary bypass graft: Secondary | ICD-10-CM | POA: Insufficient documentation

## 2011-11-15 DIAGNOSIS — E119 Type 2 diabetes mellitus without complications: Secondary | ICD-10-CM | POA: Diagnosis not present

## 2011-11-15 DIAGNOSIS — I10 Essential (primary) hypertension: Secondary | ICD-10-CM | POA: Insufficient documentation

## 2011-11-15 MED ORDER — FUROSEMIDE 40 MG PO TABS: 40.0000 mg | ORAL_TABLET | Freq: Every day | ORAL | Status: DC

## 2011-11-15 NOTE — Telephone Encounter (Signed)
Refill sent to [facility]  In [facility] per pt request.

## 2011-11-15 NOTE — Telephone Encounter (Signed)
Pt needs his furosimide called into [facility] in [place] it was suppose to be done Friday and it was not can it please be done today

## 2011-11-17 ENCOUNTER — Telehealth: Payer: Self-pay | Admitting: Cardiology

## 2011-11-17 ENCOUNTER — Encounter (HOSPITAL_COMMUNITY)
Admission: RE | Admit: 2011-11-17 | Discharge: 2011-11-17 | Disposition: A | Discharge status: Home / Self Care | Payer: Medicare Other | Source: Ambulatory Visit | Attending: Cardiology | Admitting: Cardiology

## 2011-11-17 NOTE — Telephone Encounter (Signed)
Please return call to [facility] [phone]  Patient dosage was increased, new RX needed.

## 2011-11-19 ENCOUNTER — Encounter (HOSPITAL_COMMUNITY)
Admission: RE | Admit: 2011-11-19 | Discharge: 2011-11-19 | Disposition: A | Discharge status: Home / Self Care | Payer: Medicare Other | Source: Ambulatory Visit | Attending: Cardiology | Admitting: Cardiology

## 2011-11-19 ENCOUNTER — Telehealth: Payer: Self-pay | Admitting: *Deleted

## 2011-11-19 NOTE — Telephone Encounter (Signed)
Pharmacy was called to verifiy rx dosage. Pharmacy never received furosemide 40mg  rx request. I verbally gave to rx instructions from the refill from 11-15-2011. Pharmacy agreed and said they will refill as said.  [name] Cma

## 2011-11-22 ENCOUNTER — Encounter (HOSPITAL_COMMUNITY)
Admission: RE | Admit: 2011-11-22 | Discharge: 2011-11-22 | Disposition: A | Discharge status: Home / Self Care | Payer: Medicare Other | Source: Ambulatory Visit | Attending: Cardiology | Admitting: Cardiology

## 2011-11-22 ENCOUNTER — Ambulatory Visit: Payer: Medicare Other | Admitting: Cardiology

## 2011-11-24 ENCOUNTER — Encounter (HOSPITAL_COMMUNITY): Payer: Medicare Other

## 2011-11-26 ENCOUNTER — Encounter (HOSPITAL_COMMUNITY)
Admission: RE | Admit: 2011-11-26 | Discharge: 2011-11-26 | Disposition: A | Discharge status: Home / Self Care | Payer: Medicare Other | Source: Ambulatory Visit | Attending: Cardiology | Admitting: Cardiology

## 2011-11-29 ENCOUNTER — Encounter (HOSPITAL_COMMUNITY)
Admission: RE | Admit: 2011-11-29 | Discharge: 2011-11-29 | Disposition: A | Discharge status: Home / Self Care | Payer: Medicare Other | Source: Ambulatory Visit | Attending: Cardiology | Admitting: Cardiology

## 2011-12-01 ENCOUNTER — Encounter (HOSPITAL_COMMUNITY)
Admission: RE | Admit: 2011-12-01 | Discharge: 2011-12-01 | Disposition: A | Discharge status: Home / Self Care | Payer: Medicare Other | Source: Ambulatory Visit | Attending: Cardiology | Admitting: Cardiology

## 2011-12-03 ENCOUNTER — Encounter (HOSPITAL_COMMUNITY): Payer: Medicare Other

## 2011-12-03 ENCOUNTER — Other Ambulatory Visit (HOSPITAL_COMMUNITY): Payer: Self-pay | Admitting: Cardiology

## 2011-12-06 ENCOUNTER — Encounter (HOSPITAL_COMMUNITY)
Admission: RE | Admit: 2011-12-06 | Discharge: 2011-12-06 | Disposition: A | Discharge status: Home / Self Care | Payer: Medicare Other | Source: Ambulatory Visit | Attending: Cardiology | Admitting: Cardiology

## 2011-12-08 ENCOUNTER — Encounter (HOSPITAL_COMMUNITY)
Admission: RE | Admit: 2011-12-08 | Discharge: 2011-12-08 | Disposition: A | Discharge status: Home / Self Care | Payer: Medicare Other | Source: Ambulatory Visit | Attending: Cardiology | Admitting: Cardiology

## 2011-12-10 ENCOUNTER — Encounter (HOSPITAL_COMMUNITY): Payer: Medicare Other

## 2011-12-13 ENCOUNTER — Encounter (HOSPITAL_COMMUNITY): Payer: Medicare Other

## 2011-12-15 ENCOUNTER — Encounter (HOSPITAL_COMMUNITY)
Admission: RE | Admit: 2011-12-15 | Discharge: 2011-12-15 | Disposition: A | Discharge status: Home / Self Care | Payer: Medicare Other | Source: Ambulatory Visit | Attending: Cardiology | Admitting: Cardiology

## 2011-12-17 ENCOUNTER — Encounter (HOSPITAL_COMMUNITY)
Admission: RE | Admit: 2011-12-17 | Discharge: 2011-12-17 | Disposition: A | Discharge status: Home / Self Care | Payer: Medicare Other | Source: Ambulatory Visit | Attending: Cardiology | Admitting: Cardiology

## 2011-12-17 DIAGNOSIS — I252 Old myocardial infarction: Secondary | ICD-10-CM | POA: Diagnosis not present

## 2011-12-17 DIAGNOSIS — I10 Essential (primary) hypertension: Secondary | ICD-10-CM | POA: Diagnosis not present

## 2011-12-17 DIAGNOSIS — E119 Type 2 diabetes mellitus without complications: Secondary | ICD-10-CM | POA: Diagnosis not present

## 2011-12-17 DIAGNOSIS — I25810 Atherosclerosis of coronary artery bypass graft(s) without angina pectoris: Secondary | ICD-10-CM | POA: Insufficient documentation

## 2011-12-17 DIAGNOSIS — Z5189 Encounter for other specified aftercare: Secondary | ICD-10-CM | POA: Diagnosis not present

## 2011-12-17 DIAGNOSIS — E785 Hyperlipidemia, unspecified: Secondary | ICD-10-CM | POA: Insufficient documentation

## 2011-12-17 DIAGNOSIS — Z9861 Coronary angioplasty status: Secondary | ICD-10-CM | POA: Diagnosis not present

## 2011-12-17 DIAGNOSIS — R0602 Shortness of breath: Secondary | ICD-10-CM | POA: Diagnosis not present

## 2011-12-17 DIAGNOSIS — I059 Rheumatic mitral valve disease, unspecified: Secondary | ICD-10-CM | POA: Insufficient documentation

## 2011-12-17 DIAGNOSIS — I2510 Atherosclerotic heart disease of native coronary artery without angina pectoris: Secondary | ICD-10-CM | POA: Insufficient documentation

## 2011-12-17 DIAGNOSIS — I2789 Other specified pulmonary heart diseases: Secondary | ICD-10-CM | POA: Insufficient documentation

## 2011-12-17 DIAGNOSIS — Z951 Presence of aortocoronary bypass graft: Secondary | ICD-10-CM | POA: Insufficient documentation

## 2011-12-20 ENCOUNTER — Encounter (HOSPITAL_COMMUNITY)
Admission: RE | Admit: 2011-12-20 | Discharge: 2011-12-20 | Disposition: A | Discharge status: Home / Self Care | Payer: Medicare Other | Source: Ambulatory Visit | Attending: Cardiology | Admitting: Cardiology

## 2011-12-22 ENCOUNTER — Encounter (HOSPITAL_COMMUNITY)
Admission: RE | Admit: 2011-12-22 | Discharge: 2011-12-22 | Disposition: A | Discharge status: Home / Self Care | Payer: Medicare Other | Source: Ambulatory Visit | Attending: Cardiology | Admitting: Cardiology

## 2011-12-24 ENCOUNTER — Encounter (HOSPITAL_COMMUNITY)
Admission: RE | Admit: 2011-12-24 | Discharge: 2011-12-24 | Disposition: A | Discharge status: Home / Self Care | Payer: Medicare Other | Source: Ambulatory Visit | Attending: Cardiology | Admitting: Cardiology

## 2011-12-27 ENCOUNTER — Encounter (HOSPITAL_COMMUNITY)
Admission: RE | Admit: 2011-12-27 | Discharge: 2011-12-27 | Disposition: A | Discharge status: Home / Self Care | Payer: Medicare Other | Source: Ambulatory Visit | Attending: Cardiology | Admitting: Cardiology

## 2011-12-29 ENCOUNTER — Encounter (HOSPITAL_COMMUNITY)
Admission: RE | Admit: 2011-12-29 | Discharge: 2011-12-29 | Disposition: A | Discharge status: Home / Self Care | Payer: Medicare Other | Source: Ambulatory Visit | Attending: Cardiology | Admitting: Cardiology

## 2011-12-31 ENCOUNTER — Encounter (HOSPITAL_COMMUNITY)
Admission: RE | Admit: 2011-12-31 | Discharge: 2011-12-31 | Disposition: A | Discharge status: Home / Self Care | Payer: Medicare Other | Source: Ambulatory Visit | Attending: Cardiology | Admitting: Cardiology

## 2012-01-03 ENCOUNTER — Encounter (HOSPITAL_COMMUNITY): Payer: Medicare Other

## 2012-01-10 ENCOUNTER — Ambulatory Visit: Payer: Medicare Other | Admitting: Cardiology

## 2012-02-28 ENCOUNTER — Ambulatory Visit (INDEPENDENT_AMBULATORY_CARE_PROVIDER_SITE_OTHER): Payer: Medicare Other | Admitting: Cardiology

## 2012-02-28 ENCOUNTER — Encounter: Payer: Self-pay | Admitting: Cardiology

## 2012-02-28 VITALS — BP 138/68 | HR 70 | Ht 69.0 in | Wt 174.0 lb

## 2012-02-28 DIAGNOSIS — N289 Disorder of kidney and ureter, unspecified: Secondary | ICD-10-CM | POA: Diagnosis not present

## 2012-02-28 DIAGNOSIS — I2510 Atherosclerotic heart disease of native coronary artery without angina pectoris: Secondary | ICD-10-CM | POA: Diagnosis not present

## 2012-02-28 DIAGNOSIS — R0602 Shortness of breath: Secondary | ICD-10-CM

## 2012-02-28 LAB — BASIC METABOLIC PANEL
Chloride: 106 mEq/L (ref 96–112)
Potassium: 4.1 mEq/L (ref 3.5–5.1)
Sodium: 139 mEq/L (ref 135–145)

## 2012-02-28 NOTE — Progress Notes (Signed)
Patient ID: [name], male   DOB: 04-Jan-1926, 78 y.o.   MRN: [id number]   HPI Patient is seen today in a followup coronary disease and CHF. He is doing very well. He had some problems with fluid overload. He is doing much better with salt and fluid intake. His fluid status is stable. He's not having any chest pain or shortness of breath.  He completed a cardiac rehabilitation program.  Allergies  Allergen Reactions  . Atorvastatin     REACTION: myalgias  . Folic Acid     REACTION: Face burns  . Nicardipine Hcl     REACTION: Gingival hypertrophy  . Pravastatin     myalgias    Current Outpatient Prescriptions  Medication Sig Dispense Refill  . acetaminophen (TYLENOL) 325 MG tablet Take 650 mg by mouth every 6 (six) hours as needed.      Marland Kitchen amLODipine (NORVASC) 10 MG tablet TAKE 1 TABLET BY MOUTH EVERY DAY  30 tablet  3  . aspirin 81 MG tablet Take 81 mg by mouth daily.      . benazepril (LOTENSIN) 40 MG tablet Take 40 mg by mouth daily.      . carvedilol (COREG) 3.125 MG tablet Take 3.125 mg by mouth daily.      . cholecalciferol (VITAMIN D) 1000 UNITS tablet Take 2,000 Units by mouth daily.      . clopidogrel (PLAVIX) 75 MG tablet Take 1 tablet (75 mg total) by mouth daily with breakfast.  30 tablet  6  . dorzolamide (TRUSOPT) 2 % ophthalmic solution 1 drop both eyes in the evening      . furosemide (LASIX) 40 MG tablet Take 1 tablet (40 mg total) by mouth daily. Take an extra lasix if needed for a weight increase.  45 tablet  11  . glimepiride (AMARYL) 2 MG tablet Take 1 mg by mouth daily before breakfast.      . levothyroxine (SYNTHROID, LEVOTHROID) 25 MCG tablet Take 25 mcg by mouth daily.      Marland Kitchen LUMIGAN 0.03 % ophthalmic solution 1 drop both eyes in the am      . nitroGLYCERIN (NITROSTAT) 0.4 MG SL tablet Place 1 tablet (0.4 mg total) under the tongue every 5 (five) minutes x 3 doses as needed for chest pain (up to 3 doses).  25 tablet  3  . DISCONTD: amLODipine (NORVASC) 10  MG tablet Take 10 mg by mouth daily.      Marland Kitchen DISCONTD: pravastatin (PRAVACHOL) 20 MG tablet Take 20 mg by mouth daily.          History   Social History  . Marital Status: Married    Spouse Name: N/A    Number of Children: N/A  . Years of Education: N/A   Occupational History  . Not on file.   Social History Main Topics  . Smoking status: Former Smoker -- 1.0 packs/day for 5 years    Quit date: 05/17/1949  . Smokeless tobacco: Never Used   Comment: Quit at age 50  . Alcohol Use: No  . Drug Use: No  . Sexually Active: Not on file   Other Topics Concern  . Not on file   Social History Narrative  . No narrative on file    Family History  Problem Relation Age of Onset  . Diabetes    . Coronary artery disease Father   . Heart attack Father     Past Medical History  Diagnosis Date  . CAD (  coronary artery disease)     Bare-metal stent to SVG circumflex 2008 / nuclear March, 2012, scar distal anterior, anterolateral and apical, no ischemia  . Hypertension   . Ejection fraction     45%, echo, March, 2012,   /  Ejection fraction after his MI March, 2013 is 45%. There is posterior basal akinesis with abnormal septal motion.  . Aortic insufficiency     Moderate, echo, March, 2012  . Mitral regurgitation     Moderate, echo, March, 2012  . Pulmonary hypertension     Echo, March, 2012, 42 mmHg  . Hx of CABG   . Diabetes mellitus   . Dyslipidemia   . Hypothyroidism   . Bradycardia     Sinus bradycardia  . RBBB (right bundle branch block with left anterior fascicular block)     New July, 2010  . Leg cramps     Nighttime  . Shingles     Severe pain to right flank treated  . Dizziness     Mild, August, 2011  . Ischemia     .Marland KitchenMarland KitchenEF 42%  . Hyperlipidemia   . History of colonoscopy 2005  . Dizziness     December, 2012  . Abnormal LFTs     LFTs were abnormal with acute MI March, 2013, improving at the time of discharge  . Myocardial infarction 3/19-27/2013    ARF,  elevated LFTs  . Systolic CHF   . Sinus drainage     Past Surgical History  Procedure Date  . Coronary artery bypass graft 1996    5 vessels  . Back surgery 1999    LS sx- 1999  . Spinal fusion 2000    scar tissue, plate insertion/fusion @ [facility] spine - 2000  . Cardiac surgery   . Cardiac catheterization 2013    occluded graft    Patient Active Problem List  Diagnosis  . POSTHERPETIC NEURALGIA  . SHINGLES  . DM w/o Complication Type II  . VITAMIN D DEFICIENCY  . THROMBOCYTOPENIA  . GLAUCOMA, BORDERLINE  . ARTHRALGIA  . UNSPECIFIED MYALGIA AND MYOSITIS  . Dizziness and Giddiness  . SHORTNESS OF BREATH  . ADVERSE DRUG REACTION  . CAD (coronary artery disease)  . Hypertension  . Aortic insufficiency  . Mitral regurgitation  . Pulmonary hypertension  . Hx of CABG  . Diabetes mellitus  . Dyslipidemia  . Hypothyroidism  . Bradycardia  . RBBB (right bundle branch block with left anterior fascicular block)  . Leg cramps  . Shingles  . Dizziness  . Renal insufficiency  . Hypokalemia  . Abnormal LFTs  . Ejection fraction  . Bronchitis  . Systolic CHF  . Sinus drainage    ROS   Patient denies fever, chills, headache, sweats, rash, change in vision, change in hearing, chest pain, cough, nausea vomiting, urinary symptoms. All other systems are reviewed and are negative.  PHYSICAL EXAM  Patient is oriented to person time and place. Affect is normal. He's here with his wife. He really looks good. There is no jugulovenous distention. Lungs are clear. Respiratory effort is nonlabored. There are carotid bruits. Cardiac exam reveals S1 and S2. There is a soft systolic murmur. The abdomen is soft. Is no peripheral edema.  Filed Vitals:   02/28/12 1441  BP: 138/68  Pulse: 70  Height: 5\' 9"  (1.753 m)  Weight: 174 lb (78.926 kg)  EKG is done today and reviewed by me. There is old right bundle branch block. There is no change.  ASSESSMENT & PLAN

## 2012-02-28 NOTE — Assessment & Plan Note (Signed)
His coronary disease is stable. No change in therapy. 

## 2012-02-28 NOTE — Patient Instructions (Addendum)
Your physician recommends that you return for lab work in: today bmet  Your physician wants you to follow-up in: 6 months  You will receive a reminder letter in the mail two months in advance. If you don't receive a letter, please call our office to schedule the follow-up appointment.   

## 2012-02-28 NOTE — Assessment & Plan Note (Signed)
His breathing is greatly improved. No change in therapy.

## 2012-02-28 NOTE — Assessment & Plan Note (Signed)
The patient does have some renal insufficiency. Chemistry will be checked today. I'll see him back in 6 months.

## 2012-03-20 ENCOUNTER — Other Ambulatory Visit (HOSPITAL_COMMUNITY): Payer: Self-pay | Admitting: Cardiology

## 2012-03-24 ENCOUNTER — Telehealth: Payer: Self-pay | Admitting: Internal Medicine

## 2012-03-24 ENCOUNTER — Encounter: Payer: Self-pay | Admitting: Internal Medicine

## 2012-03-24 ENCOUNTER — Ambulatory Visit (INDEPENDENT_AMBULATORY_CARE_PROVIDER_SITE_OTHER): Payer: Medicare Other | Admitting: Internal Medicine

## 2012-03-24 VITALS — BP 128/80 | HR 70 | Temp 98.2°F | Resp 16 | Wt 174.0 lb

## 2012-03-24 DIAGNOSIS — I10 Essential (primary) hypertension: Secondary | ICD-10-CM | POA: Diagnosis not present

## 2012-03-24 DIAGNOSIS — E119 Type 2 diabetes mellitus without complications: Secondary | ICD-10-CM | POA: Diagnosis not present

## 2012-03-24 DIAGNOSIS — I2510 Atherosclerotic heart disease of native coronary artery without angina pectoris: Secondary | ICD-10-CM

## 2012-03-24 DIAGNOSIS — E785 Hyperlipidemia, unspecified: Secondary | ICD-10-CM | POA: Diagnosis not present

## 2012-03-24 LAB — LIPID PANEL
Cholesterol: 237 mg/dL — ABNORMAL HIGH (ref 0–200)
Total CHOL/HDL Ratio: 5

## 2012-03-24 LAB — MICROALBUMIN / CREATININE URINE RATIO
Creatinine,U: 56.7 mg/dL
Microalb Creat Ratio: 3.2 mg/g (ref 0.0–30.0)
Microalb, Ur: 1.8 mg/dL (ref 0.0–1.9)

## 2012-03-24 LAB — HEMOGLOBIN A1C: Hgb A1c MFr Bld: 7.4 % — ABNORMAL HIGH (ref 4.6–6.5)

## 2012-03-24 NOTE — Assessment & Plan Note (Signed)
He is on oral Glimiperide; metformin was discontinued the time of his cardiac catheterization in April. A1c and urine microalbumin will be checked. His creatinine was 1.3 on 02/28/12

## 2012-03-24 NOTE — Telephone Encounter (Signed)
Noted, patient with scheduled appointment today

## 2012-03-24 NOTE — Patient Instructions (Addendum)
Eat a low-fat diet with lots of fruits and vegetables, up to 7-9 servings per day. Consume less than 40 (ZERO is BEST) grams of sugar per day from foods & drinks with High Fructose Corn Syrup (HFCS) sugar as #1,2,3 or # 4 on label.Whole [employer], [employer] & [employer] do not carry products with HFCS. Follow a  low carb nutrition program such as [place] or [employer]  to prevent Diabetes progression . White carbohydrates (potatoes, rice, bread, and pasta) have a high spike of sugar and a high load of sugar. For example a  baked potato has a cup of sugar and a  french fry  2 teaspoons of sugar. Yams, wild  rice, whole grained bread &  wheat pasta have been much lower spike and load of  sugar. Portions should be the size of a deck of cards or your palm.

## 2012-03-24 NOTE — Assessment & Plan Note (Signed)
Lipids will be checked; LDL goal would be less than 70 in the context of coronary disease and diabetes.

## 2012-03-24 NOTE — Telephone Encounter (Signed)
Caller: Edith/Spouse; Patient Name: [name]; PCP: Marga Melnick; Best Callback Phone Number: [phone].  Spouse calling about BS being mildly high x 2-3 weeks.  States had heart attack in May 2013, and has controlled his diabetes with diet since then, and has not been taking metformin since May 2013.  Over past 2 weeks or so, BS has been running 160-180, and on arising AM 03/24/12 BS was 214 before breakfast.  Feeling well.  Per diabetes protocol, emergent symptoms denied; advised appt, as none seen in system history in recent past.  Appt scheduled 03/24/12 1300 with Dr. Alwyn Ren.

## 2012-03-24 NOTE — Assessment & Plan Note (Signed)
As noted creatinine has improved. Blood pressure appears to be well controlled on present regimen. Verification is required as to name of the prescribing physician

## 2012-03-24 NOTE — Progress Notes (Signed)
Subjective:    Patient ID: [name], male    DOB: May 12, 1926, 78 y.o.   MRN: [id number]  HPI  An occluded graft was treated with angioplasty in March of this year. He underwent cardiac rehabilitation. He has not been on a statin. By history he's been intolerant to atorvastatin and pravastatin which caused myalgias. On  3/20 his LDL was 97 and HDL 33. He states he was not on a statin at that time.  He has not been monitoring glucoses  on a regular basis; one month ago fasting blood sugar was 158; today it was over 200. His last A1c was 7.8% in April.    Review of Systems  Hypoglycemia :  no.                                                     Excess thirst :no;  Excess hunger:  no ;  Excess urination: ?.                                  Lightheadedness with standing:  occasionally. Chest pain: minor ; Palpitations :no ;  Pain in  calves with walking: some" tiredness"  intermittently                                                                                                                                 Non healing skin  ulcers or sores,especially over the feet:  no. Numbness or tingling or burning in feet : no .                                                                                                                                              Significant change in  Weight : no. Vision changes : no  .  Exercise : walking 3 X/ week for 20 min . Nutrition/diet:  none. Medication compliance : on Glimiperide. Metformin was discontinued at the time of his angioplasty in April because of the contrast exposure. In May his creatinine was 1.5 and GFR 46.03; on 10/14 creatinine was 1.3 and GFR 54.46 Medication adverse  Effects:  no . Eye exam : few mos ago; no retinopathy. Foot care : no.          Objective:   Physical Exam Gen.:  well-nourished in appearance. Alert, appropriate and cooperative throughout  exam.Appears younger than stated age   Eyes: No corneal or conjunctival inflammation noted.  Neck: No deformities, masses, or tenderness noted. Thyroid small. Lungs: Normal respiratory effort; chest expands symmetrically. Lungs are clear to auscultation without rales, wheezes, or increased work of breathing. Heart: Normal rate and rhythm. Normal S1; splitting  S2. No gallop, click, or rub. Grade 1/2 over 6 systolic murmur . Abdomen: Bowel sounds normal; abdomen soft and nontender. No masses, organomegaly or hernias noted.  Musculoskeletal/extremities:  No clubbing, cyanosis noted. Mixed arthritic finger changes . Nail health  good.Trace ankle edema Vascular: Carotid, radial artery, dorsalis pedis and  posterior tibial pulses are full and equal. R carotid bruit present. Neurologic: Alert and oriented x3. Deep tendon reflexes symmetrical and normal. Light touch normal over feet.           Skin: Intact without suspicious lesions or rashes. Lymph: No cervical, axillary lymphadenopathy present. Psych: Mood and affect are normal. Normally interactive                                                                                         Assessment & Plan:

## 2012-04-17 ENCOUNTER — Other Ambulatory Visit: Payer: Self-pay | Admitting: Cardiology

## 2012-04-17 MED ORDER — AMLODIPINE BESYLATE 10 MG PO TABS: 10.0000 mg | ORAL_TABLET | Freq: Every day | ORAL | Status: DC

## 2012-05-08 ENCOUNTER — Other Ambulatory Visit: Payer: Self-pay | Admitting: Internal Medicine

## 2012-05-09 NOTE — Telephone Encounter (Signed)
Patient needs to schedule a followup appointment.

## 2012-06-08 ENCOUNTER — Telehealth: Payer: Self-pay | Admitting: Internal Medicine

## 2012-06-08 ENCOUNTER — Encounter: Payer: Self-pay | Admitting: Internal Medicine

## 2012-06-08 ENCOUNTER — Ambulatory Visit (INDEPENDENT_AMBULATORY_CARE_PROVIDER_SITE_OTHER): Payer: Medicare Other | Admitting: Internal Medicine

## 2012-06-08 VITALS — BP 138/84 | HR 75 | Temp 97.4°F | Wt 179.0 lb

## 2012-06-08 DIAGNOSIS — E119 Type 2 diabetes mellitus without complications: Secondary | ICD-10-CM

## 2012-06-08 DIAGNOSIS — E785 Hyperlipidemia, unspecified: Secondary | ICD-10-CM

## 2012-06-08 DIAGNOSIS — I10 Essential (primary) hypertension: Secondary | ICD-10-CM

## 2012-06-08 MED ORDER — ROSUVASTATIN CALCIUM 10 MG PO TABS: ORAL_TABLET | ORAL | Status: DC

## 2012-06-08 NOTE — Patient Instructions (Addendum)
Please  schedule fasting Labs in 10 weeks : CK,Lipids, hepatic panel, A1c. PLEASE BRING THESE INSTRUCTIONS TO FOLLOW UP  LAB APPOINTMENT.This will guarantee correct labs are drawn, eliminating need for repeat blood sampling ( needle sticks ! ). Diagnoses /Codes: 250.00, 995.20,272.4

## 2012-06-08 NOTE — Assessment & Plan Note (Signed)
He will be given samples of Crestor 10 mg to take one pill every Sunday. After 10 weeks fasting labs will be checked

## 2012-06-08 NOTE — Assessment & Plan Note (Signed)
Blood pressure well controlled; no change will be made

## 2012-06-08 NOTE — Assessment & Plan Note (Signed)
No change will be made in his medications as he is at goal of less than 7.4% and not having hypoglycemic spells

## 2012-06-08 NOTE — Telephone Encounter (Signed)
Spoke with patients son, patient is feeling fine except for Glucose readings. Fasting BS 282 today, patient was taking 1/2 of Amaryl and since BS slowly creeping up patient taking whole tab. Patient's son states patient never heard from labs done 03/2012. I informed patient labs were sent through Mychart for we show an active status. [name] indicates patient does not know how to access Mychart and would like to deactivate. Number given to deactivate [phone]. Patient scheduled for appointment today to discuss labs as directed on lab instructions   Patient to bring glucose diary and all medication bottles

## 2012-06-08 NOTE — Progress Notes (Signed)
  Subjective:    Patient ID: [name], male    DOB: 1925-09-18, 79 y.o.   MRN: [id number]  HPI DIABETES /PMH of FASTING HYPERGLYCEMIA : Disease Monitoring: Fasting daily Blood Sugar: Highest fasting 280 and lowest average at 200  Medication Compliance: yes Diet: low salt and small portions Exercise : occasional upper body strength training (10 lb) weights. Golf during the warmer season. Ophth exam current: yes Podiatry exam not current: no  Hypoglycemic symptoms: No   HYPERTENSION: Disease Monitoring: Once weekly as various times. Blood pressure range average: 130-140's/70-80's Compliant with present antihypertensive regimen: yes     HYPERLIPIDEMIA: Diet & exercise as noted Medication Compliance:  Statin  D/Ced by patient due to leg cramps FH premature CAD / CVA :  no (updated & recorded in  FH)    Past medical history/family history/social history were all reviewed and updated.                 Review of Systems Significant headache: Occasionally and mild. Chest pain, palpitations , claudications, PNDyspnea: No     Exertional dyspnea: None Lightheadedness,Syncope: None  Edema: None Polyuria/phagia/dipsia : None Numbness & tingling : None Non healing skin lesions:      Blurred , double or loss of vision: Abd pain, bowel changes: None  Myalgias: None    Objective:   Physical Exam Gen.: Healthy and well-nourished in appearance. Alert, appropriate and cooperative throughout exam. Appears younger than stated age   Eyes: No corneal or conjunctival inflammation noted.  Neck: No deformities, masses, or tenderness noted.  Thyroid normal. Lungs: Normal respiratory effort; chest expands symmetrically. Lungs are clear to auscultation without rales, wheezes, or increased work of breathing. Heart: Normal rate and rhythm. Normal S1 and S2. No gallop, click, or rub. S4 w/o murmur. Abdomen: Bowel sounds normal; abdomen soft and nontender. No masses, organomegaly or  hernias noted.                                     Musculoskeletal/extremities: Joints  reveal mild  PIP changes. Nail health good. Able to lie down & sit up w/o help.  Vascular: Carotid, radial artery, dorsalis pedis and  posterior tibial pulses are full and equal. R carotid bruit present. Neurologic: Alert and oriented x3.        Skin: Intact without suspicious lesions or rashes. Lymph: No cervical, axillary lymphadenopathy present. Psych: Mood and affect are normal. Normally interactive                                                                                         Assessment & Plan:

## 2012-06-08 NOTE — Telephone Encounter (Signed)
Patient Information:  Caller Name: [name]  Phone: [phone]  Patient: [name], [name]  Gender: Male  DOB: 09-10-1925  Age: 79 Years  PCP: Marga Melnick  Office Follow Up:  Does the office need to follow up with this patient?: Yes  Instructions For The Office: Son in concerned about fathers diabetes and elevated blood sugar.  Encouraged to log food diary and blood sugar mointoring.  Patient concerned about elevation and would like to know if medication change is needed. Please contact son. Father is with him  RN Note:  Son in concerned about fathers diabetes and elevated blood sugar.  Encouraged to log food diary and blood sugar mointoring.  Patient concerned about elevation and would like to know if medication change is needed. Please contact son. Father is with him  Symptoms  Reason For Call & Symptoms: Son in calling concerning his fathers blood sugar.  Gradual rise over the past week. Today GBS # 282.  Patient is on Amaryl 2mg  daily and is taking medications as directed.  He was seen by Dr. Alwyn Ren on 03/24/12 but son states no follow up concerning the elevated GBS.  Diet today- Veggie omelet/wheat bread toast, hashbrown, diet coke  Reviewed Health History In EMR: Yes  Reviewed Medications In EMR: Yes  Reviewed Allergies In EMR: Yes  Reviewed Surgeries / Procedures: No  Date of Onset of Symptoms: 03/23/2012  Guideline(s) Used:  N/A  Disposition Per Guideline:   Home Care  Reason For Disposition Reached:   Blood glucose > 240 mg/dl (13 mmol/l)  Advice Given:  Treatment - Liquids  Drink at least one glass (8 oz or 240 ml) of water per hour for the next 4 hours. (Reason: adequate hydration will reduce hyperglycemia).  Generally, you should try to drink 6-8 glasses of water each day.  General  Definition of hyperglycemia: - Fasting blood glucose more than 140 mg/dL (7.5 mmol/l) or random blood glucose more than 200 mg/dL (11 mmol/l).  Symptoms of mild hyperglycemia: frequent  urination, increased thirst, fatigue, blurred vision.  General  Definition of hyperglycemia: - Fasting blood glucose more than 140 mg/dL (7.5 mmol/l) or random blood glucose more than 200 mg/dL (11 mmol/l).  Symptoms of mild hyperglycemia: frequent urination, increased thirst, fatigue, blurred vision.  Symptoms of severe hyperglycemia: weakness, progressing to confusion and coma.  Treatment - Diabetes Medications  : Continue taking your diabetes pills.  Measure and Record Your Blood Glucose  Every day you should measure your blood glucose before breakfast and before going to bed.  Record the results and show them to your doctor at your next office visit.  Call Back If:  Blood glucose more than 300 mg/dL (82.9 mmol/l), 2 or more times in a row.  Urine ketones become moderate or large  Vomiting lasting more than 4 hours or unable to drink any liquids.  Rapid breathing occurs  You become worse.  Patient Refused Recommendation:  Patient Refused Care Advice  Son in concerned about fathers diabetes and elevated blood sugar.  Encouraged to log food diary and blood sugar mointoring.  Patient concerned about elevation and would like to know if medication change is needed. Please contact son. Father is with him

## 2012-06-14 NOTE — Progress Notes (Signed)
Cardiac Rehabilitation Program Outcomes Report   Orientation:  09/07/2011 1st week report: 09/17/2011 Graduate Date:  tbd Discharge Date:  tbd # of sessions completed: 3 DX: MI  Cardiologist: Zackery Barefoot Family MD:  Megan Salon Class Time:  09:30  A.  Exercise Program:  Tolerates exercise @ 2.50 METS for 15 minutes and Bike Test Results:  Pre: Bike test 09/07/11: Pre Height 69", weight 78.4 kg. Pre BP 120/78 and HR 74. 3  minute BP 152/70, HR  97. O2 100% RPE 13/ and RPD 11 workload 5.3, Post BP 130/70, HR 96, O2 100%,. Rode a  distance of 0.44 miles  B.  Mental Health:  Good mental attitude  C.  Education/Instruction/Skills  Accurately checks own pulse.  Rest:  85  Exercise:  102, Knows THR for exercise and Uses Perceived Exertion Scale and/or Dyspnea Scale  Uses Perceived Exertion Scale and/or Dyspnea Scale  D.  Nutrition/Weight Control/Body Composition:  Adherence to prescribed nutrition program: good    E.  Blood Lipids    Lab Results  Component Value Date   CHOL 237* 03/24/2012   HDL 44.20 03/24/2012   LDLCALC 97 08/04/2011   LDLDIRECT 164.1 03/24/2012   TRIG 160.0* 03/24/2012   CHOLHDL 5 03/24/2012    F.  Lifestyle Changes:  Making positive lifestyle changes  G.  Symptoms noted with exercise:  Asymptomatic  Report Completed By:  Lelon Huh. Kabrea Seeney RN   Comments:  This is patients 1st week report.  He achieved a peak METS of 2.50.  His resting HR was 85 and resting BP was 138/68, and His peak HR was 102 and peak BP was 142/70. A halfway report will follow upon his 18th visit.

## 2012-06-14 NOTE — Progress Notes (Signed)
Cardiac Rehabilitation Program Outcomes Report   Orientation:  09/07/2011 Halfway Report: 11/08/2011 Graduate Date:  tbd Discharge Date:  tbd # of sessions completed: 18 DX: MI  Cardiologist: Zackery Barefoot Family MD:  Megan Salon Class Time:  09:30  A.  Exercise Program:  Tolerates exercise @ 4.39  METS for 15 minutes  B.  Mental Health:  Good mental attitude  C.  Education/Instruction/Skills  Accurately checks own pulse.  Rest:  79  Exercise:  101, Knows THR for exercise and Uses Perceived Exertion Scale and/or Dyspnea Scale  Uses Perceived Exertion Scale and/or Dyspnea Scale  D.  Nutrition/Weight Control/Body Composition:  Adherence to prescribed nutrition program: good    E.  Blood Lipids    Lab Results  Component Value Date   CHOL 237* 03/24/2012   HDL 44.20 03/24/2012   LDLCALC 97 08/04/2011   LDLDIRECT 164.1 03/24/2012   TRIG 160.0* 03/24/2012   CHOLHDL 5 03/24/2012    F.  Lifestyle Changes:  Making positive lifestyle changes  G.  Symptoms noted with exercise:  Asymptomatic  Report Completed By:  Lelon Huh. Jermy Couper RN   Comments:  This is patients halfway report. He achieved a peak METS of 4.39. His resting HR was 79 and resting BP was 120.68, His peak HR was 101 and peak BP was 120/70. A graduation report will follow on his 14 th visit.

## 2012-06-26 ENCOUNTER — Encounter: Payer: Self-pay | Admitting: Cardiology

## 2012-06-26 ENCOUNTER — Ambulatory Visit (INDEPENDENT_AMBULATORY_CARE_PROVIDER_SITE_OTHER): Payer: Medicare Other | Admitting: Cardiology

## 2012-06-26 ENCOUNTER — Telehealth: Payer: Self-pay | Admitting: Internal Medicine

## 2012-06-26 VITALS — BP 124/78 | HR 69 | Ht 69.0 in | Wt 179.0 lb

## 2012-06-26 DIAGNOSIS — I2510 Atherosclerotic heart disease of native coronary artery without angina pectoris: Secondary | ICD-10-CM | POA: Diagnosis not present

## 2012-06-26 DIAGNOSIS — I5020 Unspecified systolic (congestive) heart failure: Secondary | ICD-10-CM

## 2012-06-26 DIAGNOSIS — I10 Essential (primary) hypertension: Secondary | ICD-10-CM | POA: Diagnosis not present

## 2012-06-26 MED ORDER — GLIMEPIRIDE 2 MG PO TABS: 1.0000 mg | ORAL_TABLET | Freq: Every day | ORAL | Status: DC

## 2012-06-26 NOTE — Assessment & Plan Note (Signed)
The patient had one episode of chest discomfort. He's had no problems since. He is on appropriate medications. He is carrying nitroglycerin. I considered the situation very carefully and decided not to pursue any further workup at this time. I will see him back for early followup.

## 2012-06-26 NOTE — Progress Notes (Signed)
HPI   The patient is seen today to followup coronary disease and CHF. I saw him last February 28, 2012. He has actually been doing well. His fluid status has been stable. He is here today because approximately a week ago he had some discomfort in his right chest. He was very concerned about this and call for an appointment. Fortunately since that time he has had no return of his symptoms. I had a long discussion today with him and his son. It was clear that the symptoms were very mild and not similar to his last MI. Originally he also had some arm discomfort. He did not have at this time  Allergies  Allergen Reactions  . Atorvastatin     REACTION: myalgias  . Folic Acid     REACTION: Face burns  . Nicardipine Hcl     REACTION: Gingival hypertrophy  . Pravastatin     myalgias    Current Outpatient Prescriptions  Medication Sig Dispense Refill  . acetaminophen (TYLENOL) 325 MG tablet Take 650 mg by mouth as needed.       Marland Kitchen amLODipine (NORVASC) 10 MG tablet Take 1 tablet (10 mg total) by mouth daily.  30 tablet  5  . aspirin 81 MG tablet Take 81 mg by mouth daily.      . benazepril (LOTENSIN) 40 MG tablet Take 40 mg by mouth daily.      . carvedilol (COREG) 3.125 MG tablet Take 3.125 mg by mouth daily.      . cholecalciferol (VITAMIN D) 1000 UNITS tablet Take 2,000 Units by mouth daily.      . clopidogrel (PLAVIX) 75 MG tablet TAKE 1 TABLET (75 MG TOTAL) BY MOUTH DAILY WITH BREAKFAST.  30 tablet  6  . dorzolamide (TRUSOPT) 2 % ophthalmic solution 1 drop both eyes in the evening      . furosemide (LASIX) 40 MG tablet Take 1 tablet (40 mg total) by mouth daily. Take an extra lasix if needed for a weight increase.  45 tablet  11  . glimepiride (AMARYL) 2 MG tablet Take 0.5 tablets (1 mg total) by mouth daily before breakfast. 1/2 by mouth daily before breakfast, LABS DUE MARCH 2014  15 tablet  0  . levothyroxine (SYNTHROID, LEVOTHROID) 25 MCG tablet Take 25 mcg by mouth daily.      Marland Kitchen LUMIGAN  0.03 % ophthalmic solution 1 drop both eyes in the am      . nitroGLYCERIN (NITROSTAT) 0.4 MG SL tablet Place 1 tablet (0.4 mg total) under the tongue every 5 (five) minutes x 3 doses as needed for chest pain (up to 3 doses).  25 tablet  3  . rosuvastatin (CRESTOR) 10 MG tablet 1 pill every Sunday  14 tablet  0  . [DISCONTINUED] pravastatin (PRAVACHOL) 20 MG tablet Take 20 mg by mouth daily.         No current facility-administered medications for this visit.    History   Social History  . Marital Status: Married    Spouse Name: N/A    Number of Children: N/A  . Years of Education: N/A   Occupational History  . Not on file.   Social History Main Topics  . Smoking status: Former Smoker -- 1.00 packs/day for 5 years    Quit date: 05/17/1949  . Smokeless tobacco: Never Used     Comment: Quit at age 74  . Alcohol Use: No  . Drug Use: No  . Sexually Active: Not on  file   Other Topics Concern  . Not on file   Social History Narrative  . No narrative on file    Family History  Problem Relation Age of Onset  . Diabetes Sister   . Heart attack Father 81  . Lung cancer Sister   . Stroke Neg Hx   . Diabetes Brother     Past Medical History  Diagnosis Date  . CAD (coronary artery disease)     Bare-metal stent to SVG circumflex 2008 / nuclear March, 2012, scar distal anterior, anterolateral and apical, no ischemia  . Hypertension   . Ejection fraction     45%, echo, March, 2012,   /  Ejection fraction after his MI March, 2013 is 45%. There is posterior basal akinesis with abnormal septal motion.  . Aortic insufficiency     Moderate, echo, March, 2012  . Mitral regurgitation     Moderate, echo, March, 2012  . Pulmonary hypertension     Echo, March, 2012, 42 mmHg  . Hx of CABG   . Diabetes mellitus   . Dyslipidemia   . Hypothyroidism   . Bradycardia     Sinus bradycardia  . RBBB (right bundle branch block with left anterior fascicular block)     New July, 2010  . Leg  cramps     Nighttime  . Shingles     Severe pain to right flank treated  . Ischemia     .Marland KitchenMarland KitchenEF 42%  . Hyperlipidemia   . Dizziness      12/2009 &12/ 2012  . Abnormal LFTs     LFTs were abnormal with acute MI March, 2013, improving at the time of discharge  . Myocardial infarction 3/19-27/2013    ARF, elevated LFTs  . Systolic CHF     Past Surgical History  Procedure Laterality Date  . Coronary artery bypass graft  1996    5 vessels  . Back surgery  1999    LS sx- 1999  . Spinal fusion  2000    scar tissue, plate insertion/fusion @ [facility] - 2000  . Cardiac surgery    . Cardiac catheterization  2013    occluded graft; Dr Riley Kill    Patient Active Problem List  Diagnosis  . POSTHERPETIC NEURALGIA  . SHINGLES  . DM w/o Complication Type II  . VITAMIN D DEFICIENCY  . THROMBOCYTOPENIA  . GLAUCOMA, BORDERLINE  . ARTHRALGIA  . UNSPECIFIED MYALGIA AND MYOSITIS  . Dizziness and Giddiness  . SHORTNESS OF BREATH  . ADVERSE DRUG REACTION  . CAD (coronary artery disease)  . Hypertension  . Aortic insufficiency  . Mitral regurgitation  . Pulmonary hypertension  . Hx of CABG  . Dyslipidemia  . Hypothyroidism  . Bradycardia  . RBBB (right bundle branch block with left anterior fascicular block)  . Leg cramps  . Shingles  . Dizziness  . Renal insufficiency  . Hypokalemia  . Abnormal LFTs  . Ejection fraction  . Bronchitis  . Systolic CHF  . Sinus drainage    ROS   Patient denies fever, chills, headache, sweats, rash, change in vision, change in hearing, cough, nausea vomiting, urinary symptoms. All other systems are reviewed and are negative.  PHYSICAL EXAM  Patient is oriented to person time and place. Affect is normal. His son is here with him today. He is usually here with his wife.  There is no jugular venous distention. Lungs are clear. Respiratory effort is not labored. Cardiac exam reveals S1 and  S2. There no clicks or significant murmurs. Abdomen is soft.  There is no peripheral edema.  Filed Vitals:   06/26/12 1601  BP: 124/78  Pulse: 69  Height: 5\' 9"  (1.753 m)  Weight: 179 lb (81.194 kg)  SpO2: 98%   EKG is done today and reviewed by me. There is sinus rhythm. There is old right bundle branch block. There are no new changes.  ASSESSMENT & PLAN

## 2012-06-26 NOTE — Patient Instructions (Addendum)
Your physician recommends that you schedule a follow-up appointment in: late April  Your physician recommends that you continue on your current medications as directed. Please refer to the Current Medication list given to you today.

## 2012-06-26 NOTE — Assessment & Plan Note (Signed)
Blood pressure is controlled. No change in therapy. 

## 2012-06-26 NOTE — Telephone Encounter (Signed)
Refill: Glimepiride 2 mg tablet. Take 1/2 tablet by mouth every day. Qty 45. Last fill 05-09-12

## 2012-06-26 NOTE — Assessment & Plan Note (Signed)
His volume status is under good control. No change in therapy.

## 2012-06-26 NOTE — Telephone Encounter (Signed)
RX sent

## 2012-07-03 ENCOUNTER — Other Ambulatory Visit: Payer: Self-pay | Admitting: *Deleted

## 2012-07-03 DIAGNOSIS — E119 Type 2 diabetes mellitus without complications: Secondary | ICD-10-CM

## 2012-07-03 MED ORDER — GLIMEPIRIDE 2 MG PO TABS: 2.0000 mg | ORAL_TABLET | Freq: Every day | ORAL | Status: DC

## 2012-07-04 ENCOUNTER — Telehealth: Payer: Self-pay | Admitting: *Deleted

## 2012-07-04 NOTE — Telephone Encounter (Signed)
Pt wife states that Pt is suppose to take 1 tab a day not 1/2 tab. Pt wife  would like a Rx to reflect this increase in med.Please advise

## 2012-07-04 NOTE — Telephone Encounter (Signed)
A1c was 7.4 in November 2013. It needs to be rechecked. At his age; the A1c goal is less than 8%. One half pill will probably be safest dose; but we need to see an updated A1c however.

## 2012-07-05 NOTE — Telephone Encounter (Signed)
Discuss with patient wife per Pt.

## 2012-07-10 DIAGNOSIS — H4011X0 Primary open-angle glaucoma, stage unspecified: Secondary | ICD-10-CM | POA: Diagnosis not present

## 2012-07-10 DIAGNOSIS — H409 Unspecified glaucoma: Secondary | ICD-10-CM | POA: Diagnosis not present

## 2012-07-13 ENCOUNTER — Telehealth: Payer: Self-pay | Admitting: *Deleted

## 2012-07-13 MED ORDER — NONFORMULARY OR COMPOUNDED ITEM: Status: AC

## 2012-07-13 NOTE — Telephone Encounter (Signed)
Check glucose once daily if possible Fasting or morning glucose recommended M, W, F, & Sun if possible. Goal= 100-150 Glucose 2 hours after breakfast Tues, after lunch Thurs & 2 hrs after eve meal Sat if possible. Goal = < 180 

## 2012-07-13 NOTE — Telephone Encounter (Signed)
Pt is going to be given a new glucometer by [employer] so they are requesting a Rx for diabetic supply that states how often Pt is suppose to test.Please advise

## 2012-07-13 NOTE — Telephone Encounter (Signed)
Rx sent 

## 2012-07-24 ENCOUNTER — Other Ambulatory Visit: Payer: Self-pay | Admitting: Internal Medicine

## 2012-08-17 ENCOUNTER — Other Ambulatory Visit (INDEPENDENT_AMBULATORY_CARE_PROVIDER_SITE_OTHER): Payer: Medicare Other

## 2012-08-17 DIAGNOSIS — E785 Hyperlipidemia, unspecified: Secondary | ICD-10-CM

## 2012-08-17 DIAGNOSIS — E119 Type 2 diabetes mellitus without complications: Secondary | ICD-10-CM | POA: Diagnosis not present

## 2012-08-17 DIAGNOSIS — T887XXA Unspecified adverse effect of drug or medicament, initial encounter: Secondary | ICD-10-CM

## 2012-08-17 LAB — LIPID PANEL
Cholesterol: 161 mg/dL (ref 0–200)
LDL Cholesterol: 112 mg/dL — ABNORMAL HIGH (ref 0–99)
Triglycerides: 92 mg/dL (ref 0.0–149.0)
VLDL: 18.4 mg/dL (ref 0.0–40.0)

## 2012-08-17 LAB — HEPATIC FUNCTION PANEL
Albumin: 3.9 g/dL (ref 3.5–5.2)
Total Protein: 7.3 g/dL (ref 6.0–8.3)

## 2012-08-22 ENCOUNTER — Encounter: Payer: Self-pay | Admitting: Internal Medicine

## 2012-08-22 ENCOUNTER — Ambulatory Visit (INDEPENDENT_AMBULATORY_CARE_PROVIDER_SITE_OTHER): Payer: Medicare Other | Admitting: Internal Medicine

## 2012-08-22 VITALS — BP 138/80 | HR 64 | Wt 175.0 lb

## 2012-08-22 DIAGNOSIS — E785 Hyperlipidemia, unspecified: Secondary | ICD-10-CM | POA: Diagnosis not present

## 2012-08-22 DIAGNOSIS — J069 Acute upper respiratory infection, unspecified: Secondary | ICD-10-CM | POA: Diagnosis not present

## 2012-08-22 MED ORDER — INSULIN PEN NEEDLE 32G X 6 MM MISC: Status: DC

## 2012-08-22 MED ORDER — AMOXICILLIN 500 MG PO CAPS: 500.0000 mg | ORAL_CAPSULE | Freq: Three times a day (TID) | ORAL | Status: DC

## 2012-08-22 MED ORDER — INSULIN DETEMIR 100 UNIT/ML ~~LOC~~ SOLN: 12.0000 [IU] | Freq: Every day | SUBCUTANEOUS | Status: DC

## 2012-08-22 MED ORDER — ROSUVASTATIN CALCIUM 10 MG PO TABS: ORAL_TABLET | ORAL | Status: DC

## 2012-08-22 NOTE — Progress Notes (Signed)
  Subjective:    Patient ID: [name], male    DOB: June 19, 1925, 79 y.o.   MRN: [id number]  HPI  He returns for followup of his diabetes. He is on a sulfonylurea. His A1c is 9.9% which correlates with an average sugar of 241 and an increase long term risk of 98%. He is not checking two-hour postprandial glucoses. His prior A1c was 7.4% 03/24/12. He describes polydipsia and polyuria. He has no hypoglycemia.  He is on Crestor 10 mg once weekly; his LDL is 112; HDL is 30.8. Triglycerides are excellent at 92. CK and liver function tests are normal. He sustained a heart attack in March of 2013. His CK was as high as 5370.  He developed upper respiratory tract symptoms 2 weeks ago manifested as postnasal drainage. When he clears his throat he produces yellow clean. The cough is mainly upper airway. He was using what was left over prescription for cough syrup with some benefit    Review of Systems  He denies fever, chills, sweats, frontal headache, facial pain, dental pain, sore throat, otic pain, or otic discharge. The cough is not associated with shortness of breath or wheezing. It is described as mainly clearing of the throat     Objective:   Physical Exam General appearance: well nourished; no acute distress or increased work of breathing is present.  No  lymphadenopathy about the head, neck, or axilla noted.   Eyes: No conjunctival inflammation or lid edema is present.   Ears:  External ear exam shows no significant lesions or deformities.  Otoscopic examination reveals clear canals, tympanic membranes are intact bilaterally without bulging, retraction, inflammation or discharge.  Nose:  External nasal examination shows no deformity or inflammation. Nasal mucosa are dry without lesions or exudates. No septal dislocation or deviation.No obstruction to airflow.   Oral exam: Dental hygiene is good;upper partial. Lips and gums are healthy appearing.There is no oropharyngeal erythema or  exudate noted.   Neck:  No deformities,  masses, or tenderness noted.     Heart:  Normal rate and regular rhythm. S1 and S2 normal without gallop, click, rub or other extra sounds. Grade 1/6 systolic murmur  Lungs:Chest clear to auscultation; no wheezes, rhonchi,rales ,or rubs present.No increased work of breathing.    Extremities:  No cyanosis, edema, or clubbing  noted    Skin: Warm & dry          Assessment & Plan:  #1 diabetes, uncontrolled. Probable increased cardiac risk with the oral agent. Basal insulin is mandatory for optimal care  #2 dyslipidemia; significant response to extremely low dose Crestor once weekly. Increase Crestor to 10 mg Sunday and Wednesday.  #3 rhinosinusitis; amoxicillin and nasal hygiene.

## 2012-08-22 NOTE — Patient Instructions (Addendum)
Plain Mucinex (NOT D) for thick secretions ;force NON dairy fluids .   Nasal cleansing in the shower as discussed with lather of mild shampoo.After 10 seconds wash off lather while  exhaling through nostrils. Make sure that all residual soap is removed to prevent irritation.  Use a Neti pot daily only  as needed for significant sinus congestion; going from open side to congested side . Plain Allegra (NOT D )  160 daily , Loratidine 10 mg , OR Zyrtec 10 mg @ bedtime  as needed for itchy eyes & sneezing.    Check glucose once daily if possible Fasting or morning glucose recommended M, W, F, & Sun if possible. Goal= 100-150 Glucose 2 hours after breakfast Tues, after lunch Thurs & 2 hrs after eve meal Sat if possible. Goal = < 180. Please  schedule fasting Labs in 12 weeks after med changes changes: BMET,Lipids, hepatic panel, CK, A1c. PLEASE BRING THESE INSTRUCTIONS TO FOLLOW UP  LAB APPOINTMENT.This will guarantee correct labs are drawn, eliminating need for repeat blood sampling ( needle sticks ! ). Diagnoses /Codes: 250.02, 272.4

## 2012-08-31 ENCOUNTER — Ambulatory Visit (INDEPENDENT_AMBULATORY_CARE_PROVIDER_SITE_OTHER): Payer: Medicare Other | Admitting: Cardiology

## 2012-08-31 ENCOUNTER — Encounter: Payer: Self-pay | Admitting: Cardiology

## 2012-08-31 VITALS — BP 135/60 | HR 69 | Ht 69.0 in | Wt 172.0 lb

## 2012-08-31 DIAGNOSIS — R748 Abnormal levels of other serum enzymes: Secondary | ICD-10-CM | POA: Insufficient documentation

## 2012-08-31 DIAGNOSIS — I498 Other specified cardiac arrhythmias: Secondary | ICD-10-CM | POA: Diagnosis not present

## 2012-08-31 DIAGNOSIS — I2510 Atherosclerotic heart disease of native coronary artery without angina pectoris: Secondary | ICD-10-CM

## 2012-08-31 DIAGNOSIS — R7989 Other specified abnormal findings of blood chemistry: Secondary | ICD-10-CM

## 2012-08-31 DIAGNOSIS — I509 Heart failure, unspecified: Secondary | ICD-10-CM | POA: Diagnosis not present

## 2012-08-31 DIAGNOSIS — I5022 Chronic systolic (congestive) heart failure: Secondary | ICD-10-CM | POA: Diagnosis not present

## 2012-08-31 DIAGNOSIS — R945 Abnormal results of liver function studies: Secondary | ICD-10-CM

## 2012-08-31 DIAGNOSIS — R001 Bradycardia, unspecified: Secondary | ICD-10-CM

## 2012-08-31 LAB — BASIC METABOLIC PANEL
CO2: 26 mEq/L (ref 19–32)
Chloride: 101 mEq/L (ref 96–112)
Glucose, Bld: 361 mg/dL — ABNORMAL HIGH (ref 70–99)
Potassium: 4.5 mEq/L (ref 3.5–5.1)
Sodium: 136 mEq/L (ref 135–145)

## 2012-08-31 NOTE — Assessment & Plan Note (Signed)
His volume status is quite stable. His weight is actually down several pounds since I saw him last. His diuretic dose has been reduced by is wife and I am in agreement. We will check his renal function today.

## 2012-08-31 NOTE — Assessment & Plan Note (Signed)
Coronary disease is stable. No change in therapy today. 

## 2012-08-31 NOTE — Assessment & Plan Note (Signed)
He is not having any symptomatic bradycardia.

## 2012-08-31 NOTE — Assessment & Plan Note (Signed)
Over time his LFTs have improved substantially.

## 2012-08-31 NOTE — Progress Notes (Signed)
HPI  Patient is seen today to followup coronary disease. He's actually doing very well. His wife lowered his diuretic dose when he had no edema and I am certainly in agreement with this. He's not had any recurrent chest pain.  In the past he had elevated liver enzymes and elevated CPK. This improved over time and a very low dose of Crestor has been started. He did very well at one dose per week and is now starting to doses per week.  Allergies  Allergen Reactions  . Atorvastatin     REACTION: myalgias  . Folic Acid     REACTION: Face burns  . Nicardipine Hcl     REACTION: Gingival hypertrophy  . Pravastatin     myalgias    Current Outpatient Prescriptions  Medication Sig Dispense Refill  . acetaminophen (TYLENOL) 325 MG tablet Take 650 mg by mouth as needed.       Marland Kitchen amLODipine (NORVASC) 10 MG tablet Take 1 tablet (10 mg total) by mouth daily.  30 tablet  5  . amoxicillin (AMOXIL) 500 MG capsule Take 1 capsule (500 mg total) by mouth 3 (three) times daily.  30 capsule  0  . aspirin 81 MG tablet Take 81 mg by mouth daily.      . benazepril (LOTENSIN) 40 MG tablet Take 40 mg by mouth daily.      . carvedilol (COREG) 3.125 MG tablet Take 3.125 mg by mouth daily.      . cholecalciferol (VITAMIN D) 1000 UNITS tablet Take 2,000 Units by mouth daily.      . clopidogrel (PLAVIX) 75 MG tablet TAKE 1 TABLET (75 MG TOTAL) BY MOUTH DAILY WITH BREAKFAST.  30 tablet  6  . dorzolamide (TRUSOPT) 2 % ophthalmic solution 1 drop both eyes in the evening      . furosemide (LASIX) 40 MG tablet Take 20 mg by mouth daily. Take an extra lasix if needed for a weight increase.      . insulin detemir (LEVEMIR FLEXPEN) 100 UNIT/ML injection Inject 0.12 mLs (12 Units total) into the skin at bedtime.  10 mL  12  . Insulin Pen Needle 32G X 6 MM MISC Use with Levemir pen, 12 units daily in the evening, DX:250.02  100 each  3  . levothyroxine (SYNTHROID, LEVOTHROID) 25 MCG tablet Take 25 mcg by mouth daily.      Marland Kitchen  LUMIGAN 0.03 % ophthalmic solution 1 drop both eyes in the am      . NONFORMULARY OR COMPOUNDED ITEM Diabetic testing supply Check glucose once daily if possible Fasting or morning glucose recommended M, W, F, & Sun if possible Goal= 100-150 Glucose 2 hours after breakfast Tue, after lunch Th & 2 hrs after eve meal Sat if possible Goal = < 180  1 each  0  . rosuvastatin (CRESTOR) 10 MG tablet 1 pill every Sunday & Weds  28 tablet  0  . [DISCONTINUED] pravastatin (PRAVACHOL) 20 MG tablet Take 20 mg by mouth daily.         No current facility-administered medications for this visit.    History   Social History  . Marital Status: Married    Spouse Name: N/A    Number of Children: N/A  . Years of Education: N/A   Occupational History  . Not on file.   Social History Main Topics  . Smoking status: Former Smoker -- 1.00 packs/day for 5 years    Quit date: 05/17/1949  . Smokeless  tobacco: Never Used     Comment: Quit at age 30  . Alcohol Use: No  . Drug Use: No  . Sexually Active: Not on file   Other Topics Concern  . Not on file   Social History Narrative  . No narrative on file    Family History  Problem Relation Age of Onset  . Diabetes Sister   . Heart attack Father 53  . Lung cancer Sister   . Stroke Neg Hx   . Diabetes Brother     Past Medical History  Diagnosis Date  . CAD (coronary artery disease)     Bare-metal stent to SVG circumflex 2008 / nuclear March, 2012, scar distal anterior, anterolateral and apical, no ischemia  . Hypertension   . Ejection fraction     45%, echo, March, 2012,   /  Ejection fraction after his MI March, 2013 is 45%. There is posterior basal akinesis with abnormal septal motion.  . Aortic insufficiency     Moderate, echo, March, 2012  . Mitral regurgitation     Moderate, echo, March, 2012  . Pulmonary hypertension     Echo, March, 2012, 42 mmHg  . Hx of CABG   . Diabetes mellitus   . Dyslipidemia   . Hypothyroidism   .  Bradycardia     Sinus bradycardia  . RBBB (right bundle branch block with left anterior fascicular block)     New July, 2010  . Leg cramps     Nighttime  . Shingles     Severe pain to right flank treated  . Ischemia     .Marland KitchenMarland KitchenEF 42%  . Hyperlipidemia   . Dizziness      12/2009 &12/ 2012  . Abnormal LFTs     LFTs were abnormal with acute MI March, 2013, improving at the time of discharge  . Myocardial infarction 3/19-27/2013    ARF, elevated LFTs  . Systolic CHF   . Elevated CPK     2013, now in 2014 patient on very low dose Crestor    Past Surgical History  Procedure Laterality Date  . Coronary artery bypass graft  1996    5 vessels  . Back surgery  1999    LS sx- 1999  . Spinal fusion  2000    scar tissue, plate insertion/fusion @ [facility] - 2000  . Cardiac surgery    . Cardiac catheterization  2013    occluded graft; Dr Riley Kill    Patient Active Problem List  Diagnosis  . POSTHERPETIC NEURALGIA  . SHINGLES  . Type II or unspecified type diabetes mellitus without mention of complication, uncontrolled  . VITAMIN D DEFICIENCY  . THROMBOCYTOPENIA  . GLAUCOMA, BORDERLINE  . ARTHRALGIA  . UNSPECIFIED MYALGIA AND MYOSITIS  . SHORTNESS OF BREATH  . ADVERSE DRUG REACTION  . CAD (coronary artery disease)  . Hypertension  . Aortic insufficiency  . Mitral regurgitation  . Pulmonary hypertension  . Hx of CABG  . Dyslipidemia  . Hypothyroidism  . Bradycardia  . RBBB (right bundle branch block with left anterior fascicular block)  . Leg cramps  . Shingles  . Dizziness  . Renal insufficiency  . Hypokalemia  . Abnormal LFTs  . Ejection fraction  . Systolic CHF  . Elevated CPK    ROS   Patient denies fever, chills, headache, sweats, rash, change in vision, change in hearing, chest pain, cough, nausea vomiting, urinary symptoms. All other systems are reviewed and are negative.  PHYSICAL  EXAM  The patient is quite stable. He is here with his wife as always. There is  no jugulovenous distention. Lungs are clear. Respiratory effort is nonlabored. Cardiac exam reveals S1 and S2. There no clicks or significant murmurs. The abdomen is soft. There is no peripheral edema.  Filed Vitals:   08/31/12 1002  BP: 135/60  Pulse: 69  Height: 5\' 9"  (1.753 m)  Weight: 172 lb (78.019 kg)     ASSESSMENT & PLAN

## 2012-08-31 NOTE — Patient Instructions (Addendum)
Your physician recommends that you continue on your current medications as directed. Please refer to the Current Medication list given to you today.  Your physician recommends that you return for lab work in: today  Your physician recommends that you schedule a follow-up appointment in: 3 months   

## 2012-09-02 ENCOUNTER — Encounter: Payer: Self-pay | Admitting: Cardiology

## 2012-09-02 NOTE — Progress Notes (Signed)
   When I saw the patient recently his wife had noted that she had lowered the dose of his diuretic because he had no significant edema. I agreed with this. Labs were drawn and show that his BUN and creatinine are mildly elevated for him. We will contact the patient and informed his wife that I want them to stop the diuretic completely and follow him carefully to see about his weight and swelling. Followup lab will be done in another week.

## 2012-09-05 ENCOUNTER — Telehealth: Payer: Self-pay | Admitting: *Deleted

## 2012-09-05 NOTE — Telephone Encounter (Signed)
Left message to call office

## 2012-09-05 NOTE — Telephone Encounter (Signed)
Increase insulin to 18 units daily & monitor

## 2012-09-05 NOTE — Telephone Encounter (Signed)
Pt states blood sugar have still been elevated despite taking 12 units of insulin at bedtime. Pt notes that BS this am was 328 fasting and have remain at 300+ in am since starting insulin.Pt denies any symptoms as of now .Please advise

## 2012-09-06 ENCOUNTER — Telehealth: Payer: Self-pay

## 2012-09-06 NOTE — Telephone Encounter (Signed)
See other encounter.

## 2012-09-06 NOTE — Telephone Encounter (Signed)
Pt's wife called and reached the primary care office stating someone called their home this morning. I let her know the message was for pt to increase his insulin to 18 units daily and monitor. She expressed understanding and had no concerns or questions.

## 2012-09-07 ENCOUNTER — Other Ambulatory Visit: Payer: Self-pay

## 2012-09-07 DIAGNOSIS — I10 Essential (primary) hypertension: Secondary | ICD-10-CM

## 2012-09-12 ENCOUNTER — Other Ambulatory Visit (INDEPENDENT_AMBULATORY_CARE_PROVIDER_SITE_OTHER): Payer: Medicare Other

## 2012-09-12 DIAGNOSIS — I10 Essential (primary) hypertension: Secondary | ICD-10-CM

## 2012-09-12 LAB — BASIC METABOLIC PANEL
BUN: 25 mg/dL — ABNORMAL HIGH (ref 6–23)
CO2: 25 mEq/L (ref 19–32)
Chloride: 106 mEq/L (ref 96–112)
Creatinine, Ser: 1.3 mg/dL (ref 0.4–1.5)

## 2012-09-14 ENCOUNTER — Telehealth: Payer: Self-pay | Admitting: General Practice

## 2012-09-14 NOTE — Telephone Encounter (Signed)
Discuss with patient wife. She verbalized understanding and will call back with any additional concerns.

## 2012-09-14 NOTE — Telephone Encounter (Signed)
Verify the average fasting blood sugar every FOUR DAYS . Increase the daily dose of insulin by 2 units the 5th day  if the 4 day average is  averaging over 200. Continue this titration by 2 units daily EVERY 5th day  up to a max of 50 units. At that time an A1c will be rechecked to determine optimal therapy.

## 2012-09-14 NOTE — Telephone Encounter (Signed)
Pt wife called stating that pt has been consistently on his insulin and takes 18 units nightly before bed. Pt wife states that glucose reading yesterday morning was 276 and this morning was 326. States pt has been avoiding sugars and sweets. Please advise.

## 2012-09-29 ENCOUNTER — Telehealth: Payer: Self-pay | Admitting: Internal Medicine

## 2012-09-29 NOTE — Telephone Encounter (Signed)
Patient's wife called requesting a referral or recommendation from Dr. Alwyn Ren for someone to clean his ears out. Call back at home, if no answer call mobile #.

## 2012-09-29 NOTE — Telephone Encounter (Signed)
Please advise. Pt lat seen in office on 08-22-12. At that appt per Provider notes Ear canals looked clear.

## 2012-09-29 NOTE — Telephone Encounter (Signed)
Ears were clear 4/17. Please do not use Q-tips in ears. If wax is beginning to build up , please put 2-3 drops of mineral oil in the affected  ear at night to soften the wax .Cover the canal with a  cotton ball to prevent the oil from staining bed linens. In the morning fill the ear canal with hydrogen peroxide & lie in the opposite lateral decubitus position(on the side opposite the affected ear)  for 10-15 minutes. After allowing this period of time for the peroxide to dissolve the wax ;shower and use the thinnest washrag available to wick out the wax. If both ears are involved ; alternate this treatment from ear to ear each night until no wax is found on the washrag.  Please call if no response after 6 days of this regimen. This is what ENT doctors recommend to treat wax build up.  Report pain, fever, or discharge from the ear.

## 2012-09-29 NOTE — Telephone Encounter (Signed)
Called and left a detailed message of the home voicemail.

## 2012-10-02 ENCOUNTER — Other Ambulatory Visit: Payer: Self-pay | Admitting: Internal Medicine

## 2012-10-17 ENCOUNTER — Encounter: Payer: Self-pay | Admitting: Internal Medicine

## 2012-10-17 ENCOUNTER — Ambulatory Visit (INDEPENDENT_AMBULATORY_CARE_PROVIDER_SITE_OTHER): Payer: Medicare Other | Admitting: Internal Medicine

## 2012-10-17 VITALS — BP 122/74 | HR 68 | Wt 178.8 lb

## 2012-10-17 DIAGNOSIS — H40009 Preglaucoma, unspecified, unspecified eye: Secondary | ICD-10-CM

## 2012-10-17 DIAGNOSIS — E785 Hyperlipidemia, unspecified: Secondary | ICD-10-CM | POA: Diagnosis not present

## 2012-10-17 DIAGNOSIS — I10 Essential (primary) hypertension: Secondary | ICD-10-CM

## 2012-10-17 NOTE — Assessment & Plan Note (Signed)
Ophthalmologic exam 11/09/12 with Dr. Hazle Quant. He has been compliant with his eyedrops

## 2012-10-17 NOTE — Progress Notes (Signed)
  Subjective:    Patient ID: [name], male    DOB: [birth year], 79 y.o.   MRN: [id number]  HPI  He is here to followup on the [employer] form concerning driving. The specific pertinent diagnoses have been addressed.    Review of Systems  HYPERTENSION: Disease Monitoring: Blood pressure range-135/70 Chest pain, palpitations- no       Dyspnea- no Medications: Compliance- yes  Lightheadedness,Syncope- no    Edema- no  DIABETES: Disease Monitoring: Blood Sugar ranges-his present dose of basal insulin 30 units; fasting blood sugars at May 1 or over 300. He is gradually increased his insulin from 18 units at that time to the present dose. Over the last 2-3 weeks he is fasting glucose is varying from 138-281. The majority of values have been below 230. Polyuria/phagia/dipsia- slight excess urination; some thirsty       Visual problems- no Medications: Compliance- yes  Hypoglycemic symptoms- no  HYPERLIPIDEMIA: Disease Monitoring: See symptoms for Hypertension Medications: Compliance-yes  Abd pain, bowel changes- no   Muscle aches- some calf cramps         Objective:   Physical Exam Gen.:  well-nourished in appearance. Alert, appropriate and cooperative throughout exam.Appears younger than stated age  Head: Normocephalic without obvious abnormalities  Eyes: No corneal or conjunctival inflammation noted. Extraocular motion intact. Vision grossly normal with lenses Nose: External nasal exam reveals no deformity or inflammation. Nasal mucosa are pink and moist. No lesions or exudates noted.   Mouth: Oral mucosa and oropharynx reveal no lesions or exudates. Upper partial. Neck: No deformities, masses, or tenderness noted.Thyroid normal. Lungs: Normal respiratory effort; chest expands symmetrically. Lungs are clear to auscultation without rales, wheezes, or increased work of breathing. Heart: Normal rate and rhythm. Normal S1 and S2. No gallop, click, or  rub. Grade 1/2-1 over 6 systolic murmur. Abdomen: Bowel sounds normal; abdomen soft and nontender. No masses, organomegaly or hernias noted.No AAA                            Musculoskeletal/extremities: No clubbing, cyanosis, edema, or significant extremity  deformity noted. Tone & strength  Normal. Joints  reveal fusiform PIP changes. Nail health good. Able to lie down & sit up w/o help.  Vascular: Carotid, radial artery, dorsalis pedis and  posterior tibial pulses are full and equal. Bilateral carotids bruits present. Neurologic: Alert and oriented x3. Deep tendon reflexes symmetrical and normal.         Skin: Intact without suspicious lesions or rashes. Lymph: No cervical, axillary lymphadenopathy present. Psych: Mood and affect are normal. Normally interactive                                                                                        Assessment & Plan:  See Current Assessment & Plan in Problem List under specific Diagnosis

## 2012-10-17 NOTE — Assessment & Plan Note (Signed)
He is compliant with his Crestor; his most recent LDL was 112. Fasting lipids should be rechecked in August.

## 2012-10-17 NOTE — Assessment & Plan Note (Signed)
Significant improvement in fasting glucoses; A1c will be checked prior to changing basal insulin dose

## 2012-10-17 NOTE — Assessment & Plan Note (Signed)
Blood pressure is well controlled no change in medications. 

## 2012-10-17 NOTE — Patient Instructions (Signed)
Share results with all non [facility] [place] medical staff seen  

## 2012-10-20 ENCOUNTER — Encounter: Payer: Self-pay | Admitting: *Deleted

## 2012-10-30 ENCOUNTER — Other Ambulatory Visit: Payer: Self-pay | Admitting: Cardiology

## 2012-10-30 ENCOUNTER — Other Ambulatory Visit (HOSPITAL_COMMUNITY): Payer: Self-pay | Admitting: Cardiology

## 2012-11-09 DIAGNOSIS — H2510 Age-related nuclear cataract, unspecified eye: Secondary | ICD-10-CM | POA: Diagnosis not present

## 2012-11-09 DIAGNOSIS — E119 Type 2 diabetes mellitus without complications: Secondary | ICD-10-CM | POA: Diagnosis not present

## 2012-11-09 DIAGNOSIS — H409 Unspecified glaucoma: Secondary | ICD-10-CM | POA: Diagnosis not present

## 2012-11-09 DIAGNOSIS — H4011X0 Primary open-angle glaucoma, stage unspecified: Secondary | ICD-10-CM | POA: Diagnosis not present

## 2012-11-14 ENCOUNTER — Other Ambulatory Visit (INDEPENDENT_AMBULATORY_CARE_PROVIDER_SITE_OTHER): Payer: Medicare Other

## 2012-11-14 DIAGNOSIS — E119 Type 2 diabetes mellitus without complications: Secondary | ICD-10-CM

## 2012-11-14 DIAGNOSIS — E785 Hyperlipidemia, unspecified: Secondary | ICD-10-CM | POA: Diagnosis not present

## 2012-11-14 LAB — HEMOGLOBIN A1C: Hgb A1c MFr Bld: 9.7 % — ABNORMAL HIGH (ref 4.6–6.5)

## 2012-11-14 LAB — BASIC METABOLIC PANEL
BUN: 25 mg/dL — ABNORMAL HIGH (ref 6–23)
CO2: 27 mEq/L (ref 19–32)
Chloride: 106 mEq/L (ref 96–112)
Creatinine, Ser: 1.3 mg/dL (ref 0.4–1.5)
Potassium: 4.6 mEq/L (ref 3.5–5.1)

## 2012-11-14 LAB — HEPATIC FUNCTION PANEL
Albumin: 4.2 g/dL (ref 3.5–5.2)
Total Protein: 7.5 g/dL (ref 6.0–8.3)

## 2012-11-14 LAB — LIPID PANEL
Cholesterol: 151 mg/dL (ref 0–200)
LDL Cholesterol: 96 mg/dL (ref 0–99)
Total CHOL/HDL Ratio: 4
Triglycerides: 79 mg/dL (ref 0.0–149.0)
VLDL: 15.8 mg/dL (ref 0.0–40.0)

## 2012-11-20 ENCOUNTER — Encounter: Payer: Self-pay | Admitting: *Deleted

## 2012-11-21 ENCOUNTER — Other Ambulatory Visit: Payer: Self-pay | Admitting: *Deleted

## 2012-11-21 MED ORDER — GLUCOSE BLOOD VI STRP: ORAL_STRIP | Status: DC

## 2012-11-21 NOTE — Telephone Encounter (Signed)
Rx sent, Pt wife aware

## 2012-12-07 ENCOUNTER — Encounter: Payer: Self-pay | Admitting: Cardiology

## 2012-12-07 ENCOUNTER — Ambulatory Visit (INDEPENDENT_AMBULATORY_CARE_PROVIDER_SITE_OTHER): Payer: Medicare Other | Admitting: Cardiology

## 2012-12-07 VITALS — BP 132/70 | HR 66 | Ht 69.0 in | Wt 179.0 lb

## 2012-12-07 DIAGNOSIS — I5022 Chronic systolic (congestive) heart failure: Secondary | ICD-10-CM

## 2012-12-07 DIAGNOSIS — I2510 Atherosclerotic heart disease of native coronary artery without angina pectoris: Secondary | ICD-10-CM

## 2012-12-07 DIAGNOSIS — I10 Essential (primary) hypertension: Secondary | ICD-10-CM

## 2012-12-07 DIAGNOSIS — I509 Heart failure, unspecified: Secondary | ICD-10-CM | POA: Diagnosis not present

## 2012-12-07 NOTE — Patient Instructions (Addendum)
Your physician wants you to follow-up in: 3 MONTHS You will receive a reminder letter in the mail two months in advance. If you don't receive a letter, please call our office to schedule the follow-up appointment. YOUR APPT IS ON 02/27/13 @ 10 AM WITH DR. KATZ

## 2012-12-07 NOTE — Assessment & Plan Note (Signed)
Blood pressure is under reasonable control. No change in therapy. 

## 2012-12-07 NOTE — Assessment & Plan Note (Signed)
Coronary disease is stable.  No further workup. 

## 2012-12-07 NOTE — Progress Notes (Signed)
HPI  Patient seen for cardiology followup. He looks great today. He had been on a small dose of a diuretic. Most recently he had some increase in creatinine and we decided to stop the diuretic completely. He has been stable and he's not having any edema. He's not having any chest pain.  Allergies  Allergen Reactions  . Atorvastatin     REACTION: myalgias  . Folic Acid     REACTION: Face burns  . Nicardipine Hcl     REACTION: Gingival hypertrophy  . Pravastatin     myalgias    Current Outpatient Prescriptions  Medication Sig Dispense Refill  . acetaminophen (TYLENOL) 325 MG tablet Take 650 mg by mouth as needed.       Marland Kitchen amLODipine (NORVASC) 10 MG tablet TAKE 1 TABLET BY MOUTH EVERY DAY  30 tablet  5  . aspirin 81 MG tablet Take 81 mg by mouth daily.      . benazepril (LOTENSIN) 40 MG tablet Take 40 mg by mouth daily.      . carvedilol (COREG) 3.125 MG tablet Take 3.125 mg by mouth 2 (two) times daily with a meal.      . cholecalciferol (VITAMIN D) 1000 UNITS tablet Take 2,000 Units by mouth daily.      . clopidogrel (PLAVIX) 75 MG tablet TAKE 1 TABLET BY MOUTH EVERY DAY WITH BREAKFAST  30 tablet  6  . dorzolamide (TRUSOPT) 2 % ophthalmic solution 1 drop both eyes in the evening      . glucose blood (EASYMAX TEST) test strip Test Blood sugar twice a day Dx250.02  100 each  5  . insulin detemir (LEVEMIR) 100 UNIT/ML injection Inject 30 Units into the skin at bedtime.      . Insulin Pen Needle 32G X 6 MM MISC Use with Levemir pen, 12 units daily in the evening, DX:250.02  100 each  3  . levothyroxine (SYNTHROID, LEVOTHROID) 25 MCG tablet Take 25 mcg by mouth daily.      Marland Kitchen LUMIGAN 0.03 % ophthalmic solution 1 drop both eyes in the am      . NONFORMULARY OR COMPOUNDED ITEM Diabetic testing supply Check glucose once daily if possible Fasting or morning glucose recommended M, W, F, & Sun if possible Goal= 100-150 Glucose 2 hours after breakfast Tue, after lunch Th & 2 hrs after eve meal  Sat if possible Goal = < 180  1 each  0  . rosuvastatin (CRESTOR) 10 MG tablet 1 pill every Sunday & Weds  28 tablet  0  . [DISCONTINUED] pravastatin (PRAVACHOL) 20 MG tablet Take 20 mg by mouth daily.         No current facility-administered medications for this visit.    History   Social History  . Marital Status: Married    Spouse Name: N/A    Number of Children: N/A  . Years of Education: N/A   Occupational History  . Not on file.   Social History Main Topics  . Smoking status: Former Smoker -- 1.00 packs/day for 5 years    Quit date: 05/17/1949  . Smokeless tobacco: Never Used     Comment: Quit at age 12  . Alcohol Use: No  . Drug Use: No  . Sexually Active: Not on file   Other Topics Concern  . Not on file   Social History Narrative  . No narrative on file    Family History  Problem Relation Age of Onset  . Diabetes Sister   .  Heart attack Father 39  . Lung cancer Sister   . Stroke Neg Hx   . Diabetes Brother     Past Medical History  Diagnosis Date  . CAD (coronary artery disease)     Bare-metal stent to SVG circumflex 2008 / nuclear March, 2012, scar distal anterior, anterolateral and apical, no ischemia  . Hypertension   . Ejection fraction     45%, echo, March, 2012,   /  Ejection fraction after his MI March, 2013 is 45%. There is posterior basal akinesis with abnormal septal motion.  . Aortic insufficiency     Moderate, echo, March, 2012  . Mitral regurgitation     Moderate, echo, March, 2012  . Pulmonary hypertension     Echo, March, 2012, 42 mmHg  . Hx of CABG   . Diabetes mellitus   . Dyslipidemia   . Hypothyroidism   . Bradycardia     Sinus bradycardia  . RBBB (right bundle branch block with left anterior fascicular block)     New July, 2010  . Leg cramps     Nighttime  . Shingles     Severe pain to right flank treated  . Ischemia     .Marland KitchenMarland KitchenEF 42%  . Hyperlipidemia   . Dizziness      12/2009 &12/ 2012  . Abnormal LFTs     LFTs were  abnormal with acute MI March, 2013, improving at the time of discharge  . Myocardial infarction 3/19-27/2013    ARF, elevated LFTs  . Systolic CHF   . Elevated CPK     2013, now in 2014 patient on very low dose Crestor    Past Surgical History  Procedure Laterality Date  . Coronary artery bypass graft  1996    5 vessels  . Back surgery  1999    LS sx- 1999  . Spinal fusion  2000    scar tissue, plate insertion/fusion @ [facility] - 2000  . Cardiac surgery    . Cardiac catheterization  2013    occluded graft; Dr Riley Kill    Patient Active Problem List   Diagnosis Date Noted  . Chronic systolic CHF (congestive heart failure) 08/31/2012  . Elevated CPK   . Abnormal LFTs   . Ejection fraction   . Hypokalemia 08/10/2011  . Renal insufficiency 08/03/2011  . CAD (coronary artery disease)   . Hypertension   . Aortic insufficiency   . Mitral regurgitation   . Pulmonary hypertension   . Hx of CABG   . Dyslipidemia   . Hypothyroidism   . Bradycardia   . RBBB (right bundle branch block with left anterior fascicular block)   . Leg cramps   . Shingles   . Dizziness   . POSTHERPETIC NEURALGIA 04/29/2009  . VITAMIN D DEFICIENCY 02/17/2009  . ARTHRALGIA 02/12/2009  . UNSPECIFIED MYALGIA AND MYOSITIS 02/12/2009  . SHORTNESS OF BREATH 11/28/2008  . THROMBOCYTOPENIA 08/12/2008  . ADVERSE DRUG REACTION 07/16/2008  . Type II or unspecified type diabetes mellitus without mention of complication, uncontrolled 01/16/2008  . GLAUCOMA, BORDERLINE 10/16/2007    ROS   Patient denies fever, chills, headache, sweats, rash, change in vision, change in hearing, chest pain, cough, nausea vomiting, urinary symptoms. All other systems are reviewed and are negative.  PHYSICAL EXAM  Patient is oriented to person time and place. Affect is normal. He's here with his wife. There is no jugulovenous distention. Lungs are clear. Respiratory effort is not labored. Cardiac exam reveals S1 and  S2. There no  clicks or significant murmurs. Abdomen is soft. It is no peripheral edema.  Filed Vitals:   12/07/12 1005  BP: 132/70  Pulse: 66  Height: 5\' 9"  (1.753 m)  Weight: 179 lb (81.194 kg)  SpO2: 98%     ASSESSMENT & PLAN

## 2012-12-07 NOTE — Assessment & Plan Note (Signed)
His volume status is stable off diuretics. I will not restart them at this time. I discussed with the patient and his wife that he can take 1 or 2 doses of Lasix if needed at home if he develops edema. If he does not respond rapidly and he can then be in touch with [place].

## 2013-01-01 ENCOUNTER — Telehealth: Payer: Self-pay

## 2013-01-01 MED ORDER — INSULIN DETEMIR 100 UNIT/ML ~~LOC~~ SOLN: 38.0000 [IU] | Freq: Every day | SUBCUTANEOUS | Status: DC

## 2013-01-01 NOTE — Telephone Encounter (Signed)
Message left on triage voicemail: Patient is now up to 38 units daily on Flex Pen, patient needs a new rx sent in that reflects this new dosage. Patient will runt out of medication soon, the pharmacy told patient it is too early to fill rx, that's only because the units needs to be updated. Please send rx to [facility] sent with new instructions  Left message on VM informing patient/wife that message received and completed

## 2013-02-27 ENCOUNTER — Encounter: Payer: Self-pay | Admitting: Cardiology

## 2013-02-27 ENCOUNTER — Ambulatory Visit (INDEPENDENT_AMBULATORY_CARE_PROVIDER_SITE_OTHER): Payer: Medicare Other | Admitting: Cardiology

## 2013-02-27 VITALS — BP 142/78 | Ht 69.0 in | Wt 182.0 lb

## 2013-02-27 DIAGNOSIS — R0602 Shortness of breath: Secondary | ICD-10-CM

## 2013-02-27 DIAGNOSIS — I2510 Atherosclerotic heart disease of native coronary artery without angina pectoris: Secondary | ICD-10-CM

## 2013-02-27 DIAGNOSIS — I5022 Chronic systolic (congestive) heart failure: Secondary | ICD-10-CM | POA: Diagnosis not present

## 2013-02-27 DIAGNOSIS — I509 Heart failure, unspecified: Secondary | ICD-10-CM

## 2013-02-27 NOTE — Assessment & Plan Note (Signed)
CHF is stable. No change in meds.

## 2013-02-27 NOTE — Assessment & Plan Note (Signed)
He breathing well. No further workup.

## 2013-02-27 NOTE — Progress Notes (Signed)
[facility]:  HPI   Patient is doing very well. 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 He's feeling well. He's not having any chest pain or signs of heart failure. He takes a statin 2 days a week and he can tolerate this dose.  Allergies  Allergen Reactions  . Atorvastatin     REACTION: myalgias  . Folic Acid     REACTION: Face burns  . Nicardipine Hcl     REACTION: Gingival hypertrophy  . Pravastatin     myalgias    Current Outpatient Prescriptions  Medication Sig Dispense Refill  . acetaminophen (TYLENOL) 325 MG tablet Take 650 mg by mouth as needed.       Marland Kitchen amLODipine (NORVASC) 10 MG tablet TAKE 1 TABLET BY MOUTH EVERY DAY  30 tablet  5  . aspirin 81 MG tablet Take 81 mg by mouth daily.      . benazepril (LOTENSIN) 40 MG tablet Take 40 mg by mouth daily.      . carvedilol (COREG) 3.125 MG tablet Take 3.125 mg by mouth 2 (two) times daily with a meal.      . cholecalciferol (VITAMIN D) 1000 UNITS tablet Take 2,000 Units by mouth daily.      . clopidogrel (PLAVIX) 75 MG tablet TAKE 1 TABLET BY MOUTH EVERY DAY WITH BREAKFAST  30 tablet  6  . dorzolamide (TRUSOPT) 2 % ophthalmic solution 1 drop both eyes in the evening      . glucose blood (EASYMAX TEST) test strip Test Blood sugar twice a day Dx250.02  100 each  5  . insulin detemir (LEVEMIR) 100 UNIT/ML injection Inject 0.38 mL (38 Units total) into the skin at bedtime. Inject 38 Units into the skin at bedtime. DX:250.02  10 mL  3  . Insulin Pen Needle 32G X 6 MM MISC Use with Levemir pen, 12 units daily in the evening, DX:250.02  100 each  3  . levothyroxine (SYNTHROID, LEVOTHROID) 25 MCG tablet Take 25 mcg by mouth daily.      Marland Kitchen LUMIGAN 0.03 % ophthalmic solution 1 drop both eyes in the am      . NONFORMULARY OR COMPOUNDED ITEM Diabetic testing supply Check glucose once daily if possible Fasting or  morning glucose recommended M, W, F, & Sun if possible Goal= 100-150 Glucose 2 hours after breakfast Tue, after lunch Th & 2 hrs after eve meal Sat if possible Goal = < 180  1 each  0  . rosuvastatin (CRESTOR) 10 MG tablet 1 pill every Sunday & Weds  28 tablet  0  . [DISCONTINUED] pravastatin (PRAVACHOL) 20 MG tablet Take 20 mg by mouth daily.         No current facility-administered medications for this visit.    History   Social History  . Marital Status: Married    Spouse Name: N/A    Number of Children: N/A  . Years of Education: N/A   Occupational History  . Not on file.   Social History Main Topics  . Smoking status: Former Smoker -- 1.00 packs/day for 5 years    Quit date: 05/17/1949  . Smokeless tobacco: Never Used     Comment: Quit at age 25  . Alcohol Use: No  . Drug Use: No  . Sexual Activity: Not on file   Other Topics Concern  . Not on  file   Social History Narrative  . No narrative on file    Family History  Problem Relation Age of Onset  . Diabetes Sister   . Heart attack Father 50  . Lung cancer Sister   . Stroke Neg Hx   . Diabetes Brother     Past Medical History  Diagnosis Date  . CAD (coronary artery disease)     Bare-metal stent to SVG circumflex 2008 / nuclear March, 2012, scar distal anterior, anterolateral and apical, no ischemia  . Hypertension   . Ejection fraction     45%, echo, March, 2012,   /  Ejection fraction after his MI March, 2013 is 45%. There is posterior basal akinesis with abnormal septal motion.  . Aortic insufficiency     Moderate, echo, March, 2012  . Mitral regurgitation     Moderate, echo, March, 2012  . Pulmonary hypertension     Echo, March, 2012, 42 mmHg  . Hx of CABG   . Diabetes mellitus   . Dyslipidemia   . Hypothyroidism   . Bradycardia     Sinus bradycardia  . RBBB (right bundle branch block with left anterior fascicular block)     New July, 2010  . Leg cramps     Nighttime  . Shingles     Severe  pain to right flank treated  . Ischemia     .Marland KitchenMarland KitchenEF 42%  . Hyperlipidemia   . Dizziness      12/2009 &12/ 2012  . Abnormal LFTs     LFTs were abnormal with acute MI March, 2013, improving at the time of discharge  . Myocardial infarction 3/19-27/2013    ARF, elevated LFTs  . Systolic CHF   . Elevated CPK     2013, now in 2014 patient on very low dose Crestor    Past Surgical History  Procedure Laterality Date  . Coronary artery bypass graft  1996    5 vessels  . Back surgery  1999    LS sx- 1999  . Spinal fusion  2000    scar tissue, plate insertion/fusion @ [facility] - 2000  . Cardiac surgery    . Cardiac catheterization  2013    occluded graft; Dr Riley Kill    Patient Active Problem List   Diagnosis Date Noted  . Chronic systolic CHF (congestive heart failure) 08/31/2012  . Elevated CPK   . Abnormal LFTs   . Ejection fraction   . Hypokalemia 08/10/2011  . Renal insufficiency 08/03/2011  . CAD (coronary artery disease)   . Hypertension   . Aortic insufficiency   . Mitral regurgitation   . Pulmonary hypertension   . Hx of CABG   . Dyslipidemia   . Hypothyroidism   . Bradycardia   . RBBB (right bundle branch block with left anterior fascicular block)   . Leg cramps   . Shingles   . Dizziness   . POSTHERPETIC NEURALGIA 04/29/2009  . VITAMIN D DEFICIENCY 02/17/2009  . ARTHRALGIA 02/12/2009  . UNSPECIFIED MYALGIA AND MYOSITIS 02/12/2009  . SHORTNESS OF BREATH 11/28/2008  . THROMBOCYTOPENIA 08/12/2008  . ADVERSE DRUG REACTION 07/16/2008  . Type II or unspecified type diabetes mellitus without mention of complication, uncontrolled 01/16/2008  . GLAUCOMA, BORDERLINE 10/16/2007    ROS   Patient denies fever, chills, headache, sweats, rash, change in vision, change in hearing, chest pain, cough, nausea vomiting, urinary symptoms. All other systems are reviewed and are negative.  PHYSICAL EXAM  Patient is here with  his wife. He stable. He is oriented to person time and  place. Affect is normal. There is no jugulovenous distention. Lungs are clear. Respiratory effort is nonlabored. Cardiac exam reveals S1 and S2. Abdomen is soft. There is no peripheral edema.  Filed Vitals:   02/27/13 1012  BP: 142/78  Height: 5\' 9"  (1.753 m)  Weight: 182 lb (82.555 kg)     ASSESSMENT & PLAN

## 2013-02-27 NOTE — Assessment & Plan Note (Signed)
Coronary disease is stable.  No further workup. 

## 2013-02-27 NOTE — Patient Instructions (Signed)
**Note De-identified Kaylynne Andres Obfuscation** Your physician recommends that you continue on your current medications as directed. Please refer to the Current Medication list given to you today.  Your physician wants you to follow-up in: 3 months. You will receive a reminder letter in the mail two months in advance. If you don't receive a letter, please call our office to schedule the follow-up appointment.  

## 2013-03-15 DIAGNOSIS — H4011X0 Primary open-angle glaucoma, stage unspecified: Secondary | ICD-10-CM | POA: Diagnosis not present

## 2013-03-15 DIAGNOSIS — H409 Unspecified glaucoma: Secondary | ICD-10-CM | POA: Diagnosis not present

## 2013-04-03 ENCOUNTER — Other Ambulatory Visit: Payer: Self-pay | Admitting: Internal Medicine

## 2013-04-03 NOTE — Telephone Encounter (Signed)
Synthroid refilled per protocol 

## 2013-05-08 ENCOUNTER — Other Ambulatory Visit: Payer: Self-pay | Admitting: Cardiology

## 2013-05-29 ENCOUNTER — Other Ambulatory Visit: Payer: Self-pay | Admitting: Internal Medicine

## 2013-05-30 NOTE — Telephone Encounter (Signed)
Levemir refilled per protocol. JG//CMA

## 2013-06-04 ENCOUNTER — Other Ambulatory Visit: Payer: Self-pay | Admitting: Cardiology

## 2013-06-04 ENCOUNTER — Other Ambulatory Visit (HOSPITAL_COMMUNITY): Payer: Self-pay | Admitting: Cardiology

## 2013-06-04 MED ORDER — AMLODIPINE BESYLATE 10 MG PO TABS: ORAL_TABLET | ORAL | Status: DC

## 2013-06-26 ENCOUNTER — Ambulatory Visit (INDEPENDENT_AMBULATORY_CARE_PROVIDER_SITE_OTHER): Payer: Medicare Other | Admitting: Internal Medicine

## 2013-06-26 ENCOUNTER — Encounter: Payer: Self-pay | Admitting: Internal Medicine

## 2013-06-26 VITALS — BP 140/70 | HR 83 | Temp 98.4°F | Ht 69.0 in | Wt 181.6 lb

## 2013-06-26 DIAGNOSIS — H9190 Unspecified hearing loss, unspecified ear: Secondary | ICD-10-CM

## 2013-06-26 DIAGNOSIS — E785 Hyperlipidemia, unspecified: Secondary | ICD-10-CM

## 2013-06-26 DIAGNOSIS — E039 Hypothyroidism, unspecified: Secondary | ICD-10-CM

## 2013-06-26 DIAGNOSIS — E1159 Type 2 diabetes mellitus with other circulatory complications: Secondary | ICD-10-CM

## 2013-06-26 DIAGNOSIS — I10 Essential (primary) hypertension: Secondary | ICD-10-CM | POA: Diagnosis not present

## 2013-06-26 DIAGNOSIS — H9193 Unspecified hearing loss, bilateral: Secondary | ICD-10-CM

## 2013-06-26 NOTE — Patient Instructions (Addendum)
Your next office appointment will be determined based upon review of your pending labs . Those instructions will be transmitted to you  by mail . Goals for home glucose monitoring are : fasting  or morning glucose goal of  90-150. Two hours (from "first bite") after any meal , goal = < 180, preferably < 160.Any low blood glucoses should be reported immediately. Plain Mucinex (NOT D) for thick secretions ;force NON dairy fluids .   Nasal cleansing in the shower as discussed with lather of mild shampoo.After 10 seconds wash off lather while  exhaling through nostrils. Make sure that all residual soap is removed to prevent irritation.  Flonase OR Nasacort AQ 1 spray in each nostril twice a day as needed. Use the "crossover" technique into opposite nostril spraying toward opposite ear @ 45 degree angle, not straight up into nostril.  Plain Allegra (NOT D )  160 daily , Loratidine 10 mg , OR Zyrtec 10 mg @ bedtime  as needed for itchy eyes & sneezing.

## 2013-06-26 NOTE — Assessment & Plan Note (Signed)
Lipids, LFTs,CK

## 2013-06-26 NOTE — Assessment & Plan Note (Signed)
BMET 

## 2013-06-26 NOTE — Progress Notes (Signed)
Pre-visit discussion using our clinic review tool. No additional management support is needed unless otherwise documented below in the visit note.  

## 2013-06-26 NOTE — Progress Notes (Signed)
   Subjective:    Patient ID: [name], male    DOB: 1926-01-05, 80 y.o.   MRN: [id number]  HPI  Diabetes :  FBS range- 126-140 on 46 units 2 hr post meal glucose-no monitor Medication compliance-yes Hypoglycemia-no  HYPERLIPIDEMIA: Disease Monitoring: See symptoms for Hypertension Medication Compliance- yes Sun & Weds   HYPERTENSION: Disease Monitoring: Blood pressure range- 140s/70s Medication Compliance- yes Chest pain, palpitations- intermittent; Dr Ron Parker seen every 3 mos          Review of Systems Polyuria/phagia/dipsia- no  Blurred vision /diplopia/lossof vision- no Limb numbness/tingling/burning- no Non healing skin lesions-no Abd pain, bowel changes- no  Muscle aches- occasionally in neck Memory loss- no Dyspnea- no Edema- no Lightheadedness,Syncope- no   Rhinitis is chronic issue      Objective:   Physical Exam Gen.: Healthy and well-nourished in appearance. Alert, appropriate and cooperative throughout exam. Appears younger than stated age  Head: Normocephalic without obvious abnormalities;  pattern alopecia  Eyes: No corneal or conjunctival inflammation noted. Pupils equal round reactive to light and accommodation.  Ears:  Hearing is grossly decreased bilaterally. Nose: External nasal exam reveals no deformity or inflammation. Nasal mucosa are pink and moist. No lesions or exudates noted.   Mouth: Oral mucosa and oropharynx reveal no lesions or exudates. Teeth in good repair. Neck: No deformities, masses, or tenderness noted.  Thyroid normal. Lungs: Normal respiratory effort; chest expands symmetrically. Lungs are clear to auscultation without rales, wheezes, or increased work of breathing. Heart: Normal rate and rhythm. Normal S1 and S2. No gallop, click, or rub. No murmur. Abdomen: Bowel sounds normal; abdomen soft and nontender. No masses, organomegaly or hernias noted.                           Musculoskeletal/extremities:  No clubbing,  cyanosis, edema, or significant extremity  deformity noted. Tone & strength normal. Hand joints reveal fusiform DIP changes. Fingernail  health good. Able to lie down & sit up w/o help. Vascular: Carotid, radial artery, dorsalis pedis and  posterior tibial pulses are  Equal. Slightly decreased pedal pulses. Faint R carotid bruit present. Neurologic: Alert and oriented x3. Deep tendon reflexes symmetrical and normal.      Skin: Intact without suspicious lesions or rashes. Lymph: No cervical, axillary lymphadenopathy present. Psych: Mood and affect are normal. Normally interactive                                                                                        Assessment & Plan:  See Current Assessment & Plan in Problem List under specific Diagnosis

## 2013-06-26 NOTE — Assessment & Plan Note (Signed)
A1c & urine microalbumin  

## 2013-06-28 ENCOUNTER — Telehealth: Payer: Self-pay

## 2013-06-28 ENCOUNTER — Telehealth: Payer: Self-pay | Admitting: *Deleted

## 2013-06-28 ENCOUNTER — Other Ambulatory Visit (INDEPENDENT_AMBULATORY_CARE_PROVIDER_SITE_OTHER): Payer: Medicare Other

## 2013-06-28 DIAGNOSIS — I10 Essential (primary) hypertension: Secondary | ICD-10-CM | POA: Diagnosis not present

## 2013-06-28 DIAGNOSIS — E1159 Type 2 diabetes mellitus with other circulatory complications: Secondary | ICD-10-CM

## 2013-06-28 DIAGNOSIS — E785 Hyperlipidemia, unspecified: Secondary | ICD-10-CM

## 2013-06-28 LAB — MICROALBUMIN / CREATININE URINE RATIO
CREATININE, U: 166 mg/dL
MICROALB UR: 8.6 mg/dL — AB (ref 0.0–1.9)
Microalb Creat Ratio: 5.2 mg/g (ref 0.0–30.0)

## 2013-06-28 LAB — BASIC METABOLIC PANEL
BUN: 20 mg/dL (ref 6–23)
CO2: 24 mEq/L (ref 19–32)
Calcium: 8.9 mg/dL (ref 8.4–10.5)
Chloride: 106 mEq/L (ref 96–112)
Creatinine, Ser: 1.3 mg/dL (ref 0.4–1.5)
GFR: 57.26 mL/min — ABNORMAL LOW (ref >60.00)
Glucose, Bld: 106 mg/dL — ABNORMAL HIGH (ref 70–99)
POTASSIUM: 4.3 meq/L (ref 3.5–5.1)
SODIUM: 142 meq/L (ref 135–145)

## 2013-06-28 LAB — HEPATIC FUNCTION PANEL
ALBUMIN: 3.8 g/dL (ref 3.5–5.2)
ALK PHOS: 43 U/L (ref 39–117)
ALT: 22 U/L (ref 0–53)
AST: 24 U/L (ref 0–37)
Bilirubin, Direct: 0.1 mg/dL (ref 0.0–0.3)
Total Bilirubin: 0.9 mg/dL (ref 0.3–1.2)
Total Protein: 7 g/dL (ref 6.0–8.3)

## 2013-06-28 LAB — LIPID PANEL
Cholesterol: 127 mg/dL (ref 0–200)
HDL: 32 mg/dL — AB (ref >39.00)
LDL CALC: 82 mg/dL (ref 0–99)
Total CHOL/HDL Ratio: 4
Triglycerides: 67 mg/dL (ref 0.0–149.0)
VLDL: 13.4 mg/dL (ref 0.0–40.0)

## 2013-06-28 LAB — CK: CK TOTAL: 169 U/L (ref 7–232)

## 2013-06-28 LAB — HEMOGLOBIN A1C: Hgb A1c MFr Bld: 9.4 % — ABNORMAL HIGH (ref 4.6–6.5)

## 2013-06-28 LAB — TSH: TSH: 3.92 u[IU]/mL (ref 0.35–5.50)

## 2013-06-28 NOTE — Telephone Encounter (Signed)
Per lab, orders entered incorrectly, corrected/SLS

## 2013-06-28 NOTE — Telephone Encounter (Signed)
Relevant patient education assigned to patient using Emmi. ° °

## 2013-07-09 ENCOUNTER — Encounter: Payer: Self-pay | Admitting: *Deleted

## 2013-07-16 ENCOUNTER — Other Ambulatory Visit: Payer: Self-pay | Admitting: Cardiology

## 2013-07-16 MED ORDER — CLOPIDOGREL BISULFATE 75 MG PO TABS: ORAL_TABLET | ORAL | Status: DC

## 2013-07-20 ENCOUNTER — Ambulatory Visit (INDEPENDENT_AMBULATORY_CARE_PROVIDER_SITE_OTHER): Payer: Medicare Other | Admitting: Cardiology

## 2013-07-20 ENCOUNTER — Encounter: Payer: Self-pay | Admitting: Cardiology

## 2013-07-20 VITALS — BP 160/82 | HR 71 | Ht 69.0 in | Wt 183.0 lb

## 2013-07-20 DIAGNOSIS — Z789 Other specified health status: Secondary | ICD-10-CM | POA: Insufficient documentation

## 2013-07-20 DIAGNOSIS — I2510 Atherosclerotic heart disease of native coronary artery without angina pectoris: Secondary | ICD-10-CM | POA: Diagnosis not present

## 2013-07-20 NOTE — Assessment & Plan Note (Signed)
Coronary disease is stable.

## 2013-07-20 NOTE — Progress Notes (Signed)
HPI  Fortunately the patient remains stable. He's not having significant chest pain. He's not having any significant signs of CHF.  Allergies  Allergen Reactions  . Atorvastatin     REACTION: myalgias  . Folic Acid     REACTION: Face burns  . Nicardipine Hcl     REACTION: Gingival hypertrophy  . Pravastatin     myalgias    Current Outpatient Prescriptions  Medication Sig Dispense Refill  . acetaminophen (TYLENOL) 325 MG tablet Take 650 mg by mouth as needed.       Marland Kitchen amLODipine (NORVASC) 10 MG tablet TAKE 1 TABLET BY MOUTH EVERY DAY  30 tablet  3  . aspirin 81 MG tablet Take 81 mg by mouth daily.      . benazepril (LOTENSIN) 40 MG tablet Take 40 mg by mouth daily.      . carvedilol (COREG) 3.125 MG tablet Take 3.125 mg by mouth 2 (two) times daily with a meal.      . cholecalciferol (VITAMIN D) 1000 UNITS tablet Take 2,000 Units by mouth daily.      . clopidogrel (PLAVIX) 75 MG tablet TAKE 1 TABLET BY MOUTH EVERY DAY WITH BREAKFAST  30 tablet  0  . dorzolamide (TRUSOPT) 2 % ophthalmic solution 1 drop both eyes in the evening      . glucose blood (EASYMAX TEST) test strip Test Blood sugar twice a day Dx250.02  100 each  5  . insulin detemir (LEVEMIR) 100 UNIT/ML injection Inject 0.38 mL (38 Units total) into the skin at bedtime. Inject 38 Units into the skin at bedtime. DX:250.02  10 mL  3  . Insulin Pen Needle 32G X 6 MM MISC Use with Levemir pen, 12 units daily in the evening, DX:250.02  100 each  3  . LEVEMIR FLEXTOUCH 100 UNIT/ML Pen INJECT 38 UNITS SUBCUTANEOUSLY AT BEDTIME  1 pen  3  . levothyroxine (SYNTHROID, LEVOTHROID) 25 MCG tablet Take 25 mcg by mouth daily.      Marland Kitchen LUMIGAN 0.03 % ophthalmic solution 1 drop both eyes in the am      . NONFORMULARY OR COMPOUNDED ITEM Diabetic testing supply Check glucose once daily if possible Fasting or morning glucose recommended M, W, F, & Sun if possible Goal= 100-150 Glucose 2 hours after breakfast Tue, after lunch Th & 2 hrs after  eve meal Sat if possible Goal = < 180  1 each  0  . rosuvastatin (CRESTOR) 10 MG tablet 1 pill every Sunday & Weds  28 tablet  0  . [DISCONTINUED] pravastatin (PRAVACHOL) 20 MG tablet Take 20 mg by mouth daily.         No current facility-administered medications for this visit.    History   Social History  . Marital Status: Married    Spouse Name: N/A    Number of Children: N/A  . Years of Education: N/A   Occupational History  . Not on file.   Social History Main Topics  . Smoking status: Former Smoker -- 1.00 packs/day for 5 years    Quit date: 05/17/1949  . Smokeless tobacco: Never Used     Comment: Quit at age 29  . Alcohol Use: No  . Drug Use: No  . Sexual Activity: Not on file   Other Topics Concern  . Not on file   Social History Narrative  . No narrative on file    Family History  Problem Relation Age of Onset  . Diabetes Sister   .  Heart attack Father 54  . Lung cancer Sister   . Stroke Neg Hx   . Diabetes Brother     Past Medical History  Diagnosis Date  . CAD (coronary artery disease)     Bare-metal stent to SVG circumflex 2008 / nuclear March, 2012, scar distal anterior, anterolateral and apical, no ischemia  . Hypertension   . Ejection fraction     45%, echo, March, 2012,   /  Ejection fraction after his MI March, 2013 is 45%. There is posterior basal akinesis with abnormal septal motion.  . Aortic insufficiency     Moderate, echo, March, 2012  . Mitral regurgitation     Moderate, echo, March, 2012  . Pulmonary hypertension     Echo, March, 2012, 42 mmHg  . Hx of CABG   . Diabetes mellitus   . Dyslipidemia   . Hypothyroidism   . Bradycardia     Sinus bradycardia  . RBBB (right bundle branch block with left anterior fascicular block)     New July, 2010  . Leg cramps     Nighttime  . Shingles     Severe pain to right flank treated  . Ischemia     .Marland KitchenMarland KitchenEF 42%  . Hyperlipidemia   . Dizziness      12/2009 &12/ 2012  . Abnormal LFTs      LFTs were abnormal with acute MI March, 2013, improving at the time of discharge  . Myocardial infarction 3/19-27/2013    ARF, elevated LFTs  . Systolic CHF   . Elevated CPK     2013, now in 2014 patient on very low dose Crestor    Past Surgical History  Procedure Laterality Date  . Coronary artery bypass graft  1996    5 vessels  . Back surgery  1999    LS sx- 1999  . Spinal fusion  2000    scar tissue, plate insertion/fusion @ [facility] - 2000  . Cardiac surgery    . Cardiac catheterization  2013    occluded graft; Dr Lia Foyer    Patient Active Problem List   Diagnosis Date Noted  . Chronic systolic CHF (congestive heart failure) 08/31/2012  . Elevated CPK   . Abnormal LFTs   . Ejection fraction   . Hypokalemia 08/10/2011  . Renal insufficiency 08/03/2011  . CAD (coronary artery disease)   . Hypertension   . Aortic insufficiency   . Mitral regurgitation   . Pulmonary hypertension   . Hx of CABG   . Dyslipidemia   . Hypothyroidism   . Bradycardia   . RBBB (right bundle branch block with left anterior fascicular block)   . Leg cramps   . Shingles   . Dizziness   . POSTHERPETIC NEURALGIA 04/29/2009  . VITAMIN D DEFICIENCY 02/17/2009  . ARTHRALGIA 02/12/2009  . UNSPECIFIED MYALGIA AND MYOSITIS 02/12/2009  . SHORTNESS OF BREATH 11/28/2008  . THROMBOCYTOPENIA 08/12/2008  . ADVERSE DRUG REACTION 07/16/2008  . Type II or unspecified type diabetes mellitus with peripheral circulatory disorders, uncontrolled(250.72) 01/16/2008  . GLAUCOMA, BORDERLINE 10/16/2007    ROS   Patient denies fever, chills, headache, sweats, rash, change in vision, change in hearing, chest pain, cough, nausea vomiting, urinary symptoms. All other systems are reviewed and are negative.  PHYSICAL EXAM  Patient is here with his wife as always. He is oriented to person time and place. Affect is normal. Lungs are clear. Respiratory effort is nonlabored. Cardiac exam reveals S1 and S2. There no  clicks or significant murmurs. The abdomen is soft. Is no peripheral edema.  Filed Vitals:   07/20/13 1549  BP: 160/82  Pulse: 71  Height: 5\' 9"  (1.753 m)  Weight: 183 lb (83.008 kg)   EKG is done today and reviewed by me. There is right bundle-branch block that is unchanged. There sinus rhythm.  ASSESSMENT & PLAN

## 2013-07-20 NOTE — Assessment & Plan Note (Signed)
Unfortunately he is statin intolerant. He is able to tolerate Crestor 2 days per week.

## 2013-07-20 NOTE — Patient Instructions (Signed)
Continue same medications     Your physician recommends that you schedule a follow-up appointment in: 3 months

## 2013-07-30 DIAGNOSIS — H903 Sensorineural hearing loss, bilateral: Secondary | ICD-10-CM | POA: Diagnosis not present

## 2013-08-09 DIAGNOSIS — H4011X0 Primary open-angle glaucoma, stage unspecified: Secondary | ICD-10-CM | POA: Diagnosis not present

## 2013-08-09 DIAGNOSIS — H409 Unspecified glaucoma: Secondary | ICD-10-CM | POA: Diagnosis not present

## 2013-08-09 DIAGNOSIS — H2510 Age-related nuclear cataract, unspecified eye: Secondary | ICD-10-CM | POA: Diagnosis not present

## 2013-08-09 DIAGNOSIS — E119 Type 2 diabetes mellitus without complications: Secondary | ICD-10-CM | POA: Diagnosis not present

## 2013-08-20 ENCOUNTER — Other Ambulatory Visit: Payer: Self-pay | Admitting: Cardiology

## 2013-08-20 MED ORDER — CLOPIDOGREL BISULFATE 75 MG PO TABS: ORAL_TABLET | ORAL | Status: DC

## 2013-09-05 ENCOUNTER — Encounter: Payer: Self-pay | Admitting: Internal Medicine

## 2013-09-19 ENCOUNTER — Encounter: Payer: Self-pay | Admitting: Internal Medicine

## 2013-10-11 ENCOUNTER — Other Ambulatory Visit: Payer: Self-pay

## 2013-10-11 MED ORDER — INSULIN DETEMIR 100 UNIT/ML FLEXPEN: PEN_INJECTOR | SUBCUTANEOUS | Status: DC

## 2013-10-22 ENCOUNTER — Other Ambulatory Visit: Payer: Self-pay | Admitting: Cardiology

## 2013-10-22 ENCOUNTER — Other Ambulatory Visit: Payer: Self-pay

## 2013-10-22 MED ORDER — LEVOTHYROXINE SODIUM 25 MCG PO TABS: 25.0000 ug | ORAL_TABLET | Freq: Every day | ORAL | Status: DC

## 2013-10-26 ENCOUNTER — Ambulatory Visit: Payer: Medicare Other | Admitting: Cardiology

## 2013-11-20 ENCOUNTER — Telehealth: Payer: Self-pay | Admitting: *Deleted

## 2013-11-20 DIAGNOSIS — E785 Hyperlipidemia, unspecified: Secondary | ICD-10-CM

## 2013-11-20 MED ORDER — ROSUVASTATIN CALCIUM 10 MG PO TABS: ORAL_TABLET | ORAL | Status: DC

## 2013-11-20 NOTE — Telephone Encounter (Signed)
Left msg on triage stating almost finish with samples of crestor md rx. [facility] [place]. Called pt wife back inform her [facility].../lmb

## 2013-12-13 ENCOUNTER — Ambulatory Visit (INDEPENDENT_AMBULATORY_CARE_PROVIDER_SITE_OTHER): Payer: Medicare Other | Admitting: Cardiology

## 2013-12-13 ENCOUNTER — Encounter: Payer: Self-pay | Admitting: Cardiology

## 2013-12-13 VITALS — BP 149/83 | HR 64 | Ht 69.0 in | Wt 180.8 lb

## 2013-12-13 DIAGNOSIS — I10 Essential (primary) hypertension: Secondary | ICD-10-CM

## 2013-12-13 DIAGNOSIS — I2510 Atherosclerotic heart disease of native coronary artery without angina pectoris: Secondary | ICD-10-CM

## 2013-12-13 DIAGNOSIS — I509 Heart failure, unspecified: Secondary | ICD-10-CM

## 2013-12-13 DIAGNOSIS — I5022 Chronic systolic (congestive) heart failure: Secondary | ICD-10-CM

## 2013-12-13 LAB — BASIC METABOLIC PANEL
BUN: 23 mg/dL (ref 6–23)
CO2: 25 meq/L (ref 19–32)
Calcium: 9.5 mg/dL (ref 8.4–10.5)
Chloride: 107 mEq/L (ref 96–112)
Creatinine, Ser: 1.5 mg/dL (ref 0.4–1.5)
GFR: 47.57 mL/min — ABNORMAL LOW (ref >60.00)
GLUCOSE: 164 mg/dL — AB (ref 70–99)
Potassium: 3.8 mEq/L (ref 3.5–5.1)
SODIUM: 141 meq/L (ref 135–145)

## 2013-12-13 NOTE — Patient Instructions (Signed)
Your physician recommends that you continue on your current medications as directed. Please refer to the Current Medication list given to you today.  Your physician recommends that you return for lab work in: today  Your physician recommends that you schedule a follow-up appointment in: 3 to 4 months

## 2013-12-13 NOTE — Assessment & Plan Note (Signed)
Blood pressures control. No change in therapy. 

## 2013-12-13 NOTE — Assessment & Plan Note (Signed)
The patient's wife restarted a small dose of diuretic. This will be continued. Chemistry will be checked today.

## 2013-12-13 NOTE — Assessment & Plan Note (Signed)
Coronary disease is stable.  No further workup. 

## 2013-12-13 NOTE — Progress Notes (Signed)
Patient ID: [name], male   DOB: [birth year], 80 y.o.   MRN: [id number]    HPI  The patient continues to do remarkably well. He is not having any chest pain or shortness of breath. He did have return of some edema. His wife put him back on a small dose of furosemide.  Allergies  Allergen Reactions  . Atorvastatin     REACTION: myalgias  . Folic Acid     REACTION: Face burns  . Nicardipine Hcl     REACTION: Gingival hypertrophy  . Pravastatin     myalgias    Current Outpatient Prescriptions  Medication Sig Dispense Refill  . acetaminophen (TYLENOL) 325 MG tablet Take 650 mg by mouth as needed.       Marland Kitchen amLODipine (NORVASC) 10 MG tablet TAKE 1 TABLET BY MOUTH EVERY DAY  30 tablet  3  . aspirin 81 MG tablet Take 81 mg by mouth daily.      . benazepril (LOTENSIN) 40 MG tablet Take 40 mg by mouth daily.      . carvedilol (COREG) 3.125 MG tablet Take 3.125 mg by mouth 2 (two) times daily with a meal.      . cholecalciferol (VITAMIN D) 1000 UNITS tablet Take 2,000 Units by mouth daily.      . clopidogrel (PLAVIX) 75 MG tablet TAKE 1 TABLET BY MOUTH EVERY DAY WITH BREAKFAST  30 tablet  6  . dorzolamide (TRUSOPT) 2 % ophthalmic solution 1 drop both eyes in the evening      . Insulin Detemir (LEVEMIR) 100 UNIT/ML Pen INJECT 48 UNITS SUBCUTANEOUSLY AT BEDTIME      . Insulin Pen Needle 32G X 6 MM MISC Use with Levemir pen, 12 units daily in the evening, DX:250.02  100 each  3  . levothyroxine (SYNTHROID, LEVOTHROID) 25 MCG tablet Take 1 tablet (25 mcg total) by mouth daily.  90 tablet  1  . LUMIGAN 0.03 % ophthalmic solution 1 drop both eyes in the am      . NONFORMULARY OR COMPOUNDED ITEM Diabetic testing supply Check glucose once daily if possible Fasting or morning glucose recommended M, W, F, & Sun if possible Goal= 100-150 Glucose 2 hours after breakfast Tue, after lunch Th & 2 hrs after eve meal Sat if possible Goal = < 180  1 each  0  . rosuvastatin (CRESTOR) 10 MG tablet 1 pill  every Sunday & Weds  30 tablet  3  . [DISCONTINUED] pravastatin (PRAVACHOL) 20 MG tablet Take 20 mg by mouth daily.         No current facility-administered medications for this visit.    History   Social History  . Marital Status: Married    Spouse Name: N/A    Number of Children: N/A  . Years of Education: N/A   Occupational History  . Not on file.   Social History Main Topics  . Smoking status: Former Smoker -- 1.00 packs/day for 5 years    Quit date: 05/17/1949  . Smokeless tobacco: Never Used     Comment: Quit at age 38  . Alcohol Use: No  . Drug Use: No  . Sexual Activity: Not on file   Other Topics Concern  . Not on file   Social History Narrative  . No narrative on file    Family History  Problem Relation Age of Onset  . Diabetes Sister   . Heart attack Father 31  . Lung cancer Sister   .  Stroke Neg Hx   . Diabetes Brother     Past Medical History  Diagnosis Date  . CAD (coronary artery disease)     Bare-metal stent to SVG circumflex 2008 / nuclear March, 2012, scar distal anterior, anterolateral and apical, no ischemia  . Hypertension   . Ejection fraction     45%, echo, March, 2012,   /  Ejection fraction after his MI March, 2013 is 45%. There is posterior basal akinesis with abnormal septal motion.  . Aortic insufficiency     Moderate, echo, March, 2012  . Mitral regurgitation     Moderate, echo, March, 2012  . Pulmonary hypertension     Echo, March, 2012, 42 mmHg  . Hx of CABG   . Diabetes mellitus   . Dyslipidemia   . Hypothyroidism   . Bradycardia     Sinus bradycardia  . RBBB (right bundle branch block with left anterior fascicular block)     New July, 2010  . Leg cramps     Nighttime  . Shingles     Severe pain to right flank treated  . Ischemia     .Marland KitchenMarland KitchenEF 42%  . Hyperlipidemia   . Dizziness      12/2009 &12/ 2012  . Abnormal LFTs     LFTs were abnormal with acute MI March, 2013, improving at the time of discharge  . Myocardial  infarction 3/19-27/2013    ARF, elevated LFTs  . Systolic CHF   . Elevated CPK     2013, now in 2014 patient on very low dose Crestor    Past Surgical History  Procedure Laterality Date  . Coronary artery bypass graft  1996    5 vessels  . Back surgery  1999    LS sx- 1999  . Spinal fusion  2000    scar tissue, plate insertion/fusion @ [facility] - 2000  . Cardiac surgery    . Cardiac catheterization  2013    occluded graft; Dr Lia Foyer    Patient Active Problem List   Diagnosis Date Noted  . Statin intolerance 07/20/2013  . Chronic systolic CHF (congestive heart failure) 08/31/2012  . Elevated CPK   . Abnormal LFTs   . Ejection fraction   . Hypokalemia 08/10/2011  . Renal insufficiency 08/03/2011  . CAD (coronary artery disease)   . Hypertension   . Aortic insufficiency   . Mitral regurgitation   . Pulmonary hypertension   . Hx of CABG   . Dyslipidemia   . Hypothyroidism   . Bradycardia   . RBBB (right bundle branch block with left anterior fascicular block)   . Leg cramps   . Shingles   . Dizziness   . POSTHERPETIC NEURALGIA 04/29/2009  . VITAMIN D DEFICIENCY 02/17/2009  . ARTHRALGIA 02/12/2009  . UNSPECIFIED MYALGIA AND MYOSITIS 02/12/2009  . SHORTNESS OF BREATH 11/28/2008  . THROMBOCYTOPENIA 08/12/2008  . ADVERSE DRUG REACTION 07/16/2008  . Type II or unspecified type diabetes mellitus with peripheral circulatory disorders, uncontrolled(250.72) 01/16/2008  . GLAUCOMA, BORDERLINE 10/16/2007    ROS   Patient denies fever, chills, headache, sweats, rash, change in vision, change in hearing, chest pain, cough, nausea or vomiting, urinary symptoms. All other systems are reviewed and are negative.  PHYSICAL EXAM  Patient is oriented to person time and place. Affect is normal. He is here with his wife. Head is atraumatic. Sclera and conjunctiva are normal. There is no jugulovenous distention. Lungs are clear. Respiratory effort is nonlabored. Cardiac exam reveals  S1  and S2. Abdomen is soft. There is no peripheral edema.  Filed Vitals:   12/13/13 1458  BP: 149/83  Pulse: 64  Height: 5\' 9"  (1.753 m)  Weight: 180 lb 12.8 oz (82.01 kg)     ASSESSMENT & PLAN

## 2014-01-10 DIAGNOSIS — H409 Unspecified glaucoma: Secondary | ICD-10-CM | POA: Diagnosis not present

## 2014-01-10 DIAGNOSIS — H2510 Age-related nuclear cataract, unspecified eye: Secondary | ICD-10-CM | POA: Diagnosis not present

## 2014-01-10 DIAGNOSIS — E119 Type 2 diabetes mellitus without complications: Secondary | ICD-10-CM | POA: Diagnosis not present

## 2014-01-10 DIAGNOSIS — H4011X0 Primary open-angle glaucoma, stage unspecified: Secondary | ICD-10-CM | POA: Diagnosis not present

## 2014-01-16 ENCOUNTER — Other Ambulatory Visit: Payer: Self-pay | Admitting: Cardiology

## 2014-01-18 ENCOUNTER — Telehealth: Payer: Self-pay

## 2014-01-18 MED ORDER — AMLODIPINE BESYLATE 10 MG PO TABS: ORAL_TABLET | ORAL | Status: DC

## 2014-01-18 MED ORDER — ISOSORBIDE MONONITRATE ER 30 MG PO TB24: 30.0000 mg | ORAL_TABLET | Freq: Every day | ORAL | Status: DC

## 2014-01-18 NOTE — Telephone Encounter (Signed)
I have faxed this message to Dr Severiano Gilbert, the pts dentist, with a note attached stating that the pt is unaware of Dr Kae Heller recommendations as I have been unable to reach him this afternoon. I have left a message on the pts VM asking him to return my call.

## 2014-01-18 NOTE — Telephone Encounter (Signed)
Please call them back and tell them to put the amlodipine on hold. Start Imdur 30 mg daily. Follow his blood pressure at home. Let us know if he has any significant increase. Also let us know if he has any significant headaches Related to the Imdur.

## 2014-01-18 NOTE — Telephone Encounter (Signed)
**Note De-Identified Montrice Montuori Obfuscation** The pts wife, Olegario Shearer, states that the pt has had a gum infection X 2 months that they have treated with PCN which helped but did not clear the infection. She states that Dr Geralynn Ochs feels that it is due to the pt taking Amlodipine and wants to change to something else if possible.

## 2014-01-18 NOTE — Telephone Encounter (Signed)
**Note De-Identified Korra Christine Obfuscation** We received a fax from the pts dentist, Dr. Severiano Gilbert, stating that the pt is experiencing severe periodontal inflammatory/hyperplatic response to his Amlodipine and is requesting a change in his CCB therapy.  Per Dr Ron Parker I have left a message for the pt or his wife to call me concerning his periodontal problem and the need to change medications.

## 2014-01-18 NOTE — Telephone Encounter (Signed)
New message ° ° ° ° ° °Returning [name]'s call °

## 2014-01-18 NOTE — Telephone Encounter (Signed)
**Note De-identified Shuree Brossart Obfuscation** LMTCB

## 2014-01-18 NOTE — Telephone Encounter (Signed)
LMTCB

## 2014-01-23 NOTE — Telephone Encounter (Signed)
**Note De-Identified Deni Berti Obfuscation** Olegario Shearer, the pts wife, is advised and she verbalized understanding. She states that she will notify the pt of his med change. RX [facility] [place] per her request.

## 2014-02-04 ENCOUNTER — Inpatient Hospital Stay (HOSPITAL_COMMUNITY)
Admission: EM | Admit: 2014-02-04 | Discharge: 2014-02-07 | DRG: 281 | Disposition: A | Discharge status: Home / Self Care | Payer: Medicare Other | Attending: Cardiology | Admitting: Cardiology

## 2014-02-04 ENCOUNTER — Encounter (HOSPITAL_COMMUNITY): Payer: Self-pay | Admitting: Emergency Medicine

## 2014-02-04 ENCOUNTER — Emergency Department (HOSPITAL_COMMUNITY): Payer: Medicare Other

## 2014-02-04 DIAGNOSIS — Z87891 Personal history of nicotine dependence: Secondary | ICD-10-CM | POA: Diagnosis not present

## 2014-02-04 DIAGNOSIS — I129 Hypertensive chronic kidney disease with stage 1 through stage 4 chronic kidney disease, or unspecified chronic kidney disease: Secondary | ICD-10-CM | POA: Diagnosis present

## 2014-02-04 DIAGNOSIS — Z79899 Other long term (current) drug therapy: Secondary | ICD-10-CM | POA: Diagnosis not present

## 2014-02-04 DIAGNOSIS — Z8249 Family history of ischemic heart disease and other diseases of the circulatory system: Secondary | ICD-10-CM | POA: Diagnosis not present

## 2014-02-04 DIAGNOSIS — I25810 Atherosclerosis of coronary artery bypass graft(s) without angina pectoris: Secondary | ICD-10-CM | POA: Diagnosis present

## 2014-02-04 DIAGNOSIS — N183 Chronic kidney disease, stage 3 unspecified: Secondary | ICD-10-CM

## 2014-02-04 DIAGNOSIS — I200 Unstable angina: Secondary | ICD-10-CM | POA: Diagnosis not present

## 2014-02-04 DIAGNOSIS — N189 Chronic kidney disease, unspecified: Secondary | ICD-10-CM | POA: Diagnosis present

## 2014-02-04 DIAGNOSIS — I498 Other specified cardiac arrhythmias: Secondary | ICD-10-CM | POA: Diagnosis present

## 2014-02-04 DIAGNOSIS — Z7902 Long term (current) use of antithrombotics/antiplatelets: Secondary | ICD-10-CM | POA: Diagnosis not present

## 2014-02-04 DIAGNOSIS — Z9861 Coronary angioplasty status: Secondary | ICD-10-CM

## 2014-02-04 DIAGNOSIS — I509 Heart failure, unspecified: Secondary | ICD-10-CM

## 2014-02-04 DIAGNOSIS — I351 Nonrheumatic aortic (valve) insufficiency: Secondary | ICD-10-CM

## 2014-02-04 DIAGNOSIS — E039 Hypothyroidism, unspecified: Secondary | ICD-10-CM | POA: Diagnosis present

## 2014-02-04 DIAGNOSIS — E119 Type 2 diabetes mellitus without complications: Secondary | ICD-10-CM | POA: Diagnosis present

## 2014-02-04 DIAGNOSIS — I214 Non-ST elevation (NSTEMI) myocardial infarction: Secondary | ICD-10-CM

## 2014-02-04 DIAGNOSIS — Z7982 Long term (current) use of aspirin: Secondary | ICD-10-CM | POA: Diagnosis not present

## 2014-02-04 DIAGNOSIS — I252 Old myocardial infarction: Secondary | ICD-10-CM

## 2014-02-04 DIAGNOSIS — I25700 Atherosclerosis of coronary artery bypass graft(s), unspecified, with unstable angina pectoris: Secondary | ICD-10-CM

## 2014-02-04 DIAGNOSIS — Z981 Arthrodesis status: Secondary | ICD-10-CM | POA: Diagnosis not present

## 2014-02-04 DIAGNOSIS — I10 Essential (primary) hypertension: Secondary | ICD-10-CM | POA: Diagnosis present

## 2014-02-04 DIAGNOSIS — I452 Bifascicular block: Secondary | ICD-10-CM | POA: Diagnosis present

## 2014-02-04 DIAGNOSIS — I359 Nonrheumatic aortic valve disorder, unspecified: Secondary | ICD-10-CM | POA: Diagnosis not present

## 2014-02-04 DIAGNOSIS — I2510 Atherosclerotic heart disease of native coronary artery without angina pectoris: Secondary | ICD-10-CM | POA: Diagnosis present

## 2014-02-04 DIAGNOSIS — I5022 Chronic systolic (congestive) heart failure: Secondary | ICD-10-CM | POA: Diagnosis not present

## 2014-02-04 DIAGNOSIS — E1365 Other specified diabetes mellitus with hyperglycemia: Secondary | ICD-10-CM

## 2014-02-04 DIAGNOSIS — E785 Hyperlipidemia, unspecified: Secondary | ICD-10-CM | POA: Diagnosis present

## 2014-02-04 DIAGNOSIS — R001 Bradycardia, unspecified: Secondary | ICD-10-CM | POA: Diagnosis present

## 2014-02-04 DIAGNOSIS — I2589 Other forms of chronic ischemic heart disease: Secondary | ICD-10-CM | POA: Diagnosis present

## 2014-02-04 DIAGNOSIS — R0789 Other chest pain: Secondary | ICD-10-CM | POA: Diagnosis not present

## 2014-02-04 DIAGNOSIS — IMO0002 Reserved for concepts with insufficient information to code with codable children: Secondary | ICD-10-CM | POA: Diagnosis present

## 2014-02-04 DIAGNOSIS — R079 Chest pain, unspecified: Secondary | ICD-10-CM | POA: Diagnosis not present

## 2014-02-04 DIAGNOSIS — I4510 Unspecified right bundle-branch block: Secondary | ICD-10-CM | POA: Diagnosis present

## 2014-02-04 DIAGNOSIS — E1351 Other specified diabetes mellitus with diabetic peripheral angiopathy without gangrene: Secondary | ICD-10-CM | POA: Diagnosis present

## 2014-02-04 DIAGNOSIS — I25119 Atherosclerotic heart disease of native coronary artery with unspecified angina pectoris: Secondary | ICD-10-CM

## 2014-02-04 LAB — BASIC METABOLIC PANEL
Anion gap: 17 — ABNORMAL HIGH (ref 5–15)
BUN: 25 mg/dL — AB (ref 6–23)
CHLORIDE: 99 meq/L (ref 96–112)
CO2: 23 mEq/L (ref 19–32)
CREATININE: 1.39 mg/dL — AB (ref 0.50–1.35)
Calcium: 9.3 mg/dL (ref 8.4–10.5)
GFR calc Af Amer: 52 mL/min — ABNORMAL LOW (ref >90)
GFR calc non Af Amer: 45 mL/min — ABNORMAL LOW (ref >90)
Glucose, Bld: 376 mg/dL — ABNORMAL HIGH (ref 70–99)
Potassium: 3.9 mEq/L (ref 3.7–5.3)
Sodium: 139 mEq/L (ref 137–147)

## 2014-02-04 LAB — CBC WITH DIFFERENTIAL/PLATELET
BASOS PCT: 0 % (ref 0–1)
Basophils Absolute: 0 10*3/uL (ref 0.0–0.1)
Eosinophils Absolute: 0.1 10*3/uL (ref 0.0–0.7)
Eosinophils Relative: 2 % (ref 0–5)
HEMATOCRIT: 43.1 % (ref 39.0–52.0)
Hemoglobin: 15 g/dL (ref 13.0–17.0)
Lymphocytes Relative: 32 % (ref 12–46)
Lymphs Abs: 2.2 10*3/uL (ref 0.7–4.0)
MCH: 31.8 pg (ref 26.0–34.0)
MCHC: 34.8 g/dL (ref 30.0–36.0)
MCV: 91.5 fL (ref 78.0–100.0)
MONO ABS: 0.5 10*3/uL (ref 0.1–1.0)
Monocytes Relative: 8 % (ref 3–12)
Neutro Abs: 4.1 10*3/uL (ref 1.7–7.7)
Neutrophils Relative %: 58 % (ref 43–77)
Platelets: 152 10*3/uL (ref 150–400)
RBC: 4.71 MIL/uL (ref 4.22–5.81)
RDW: 13.4 % (ref 11.5–15.5)
WBC: 6.9 10*3/uL (ref 4.0–10.5)

## 2014-02-04 LAB — I-STAT TROPONIN, ED: Troponin i, poc: 0.05 ng/mL (ref 0.00–0.08)

## 2014-02-04 LAB — PROTIME-INR
INR: 0.94 (ref 0.00–1.49)
Prothrombin Time: 12.6 seconds (ref 11.6–15.2)

## 2014-02-04 LAB — TROPONIN I: Troponin I: <0.3 ng/mL (ref <0.30)

## 2014-02-04 MED ORDER — NITROGLYCERIN 0.4 MG SL SUBL
SUBLINGUAL_TABLET | SUBLINGUAL | Status: AC
  Administered 2014-02-04: 0.4 mg via SUBLINGUAL
  Filled 2014-02-04: qty 1

## 2014-02-04 MED ORDER — ASPIRIN 81 MG PO CHEW: 324.0000 mg | CHEWABLE_TABLET | ORAL | Status: AC

## 2014-02-04 MED ORDER — ASPIRIN EC 81 MG PO TBEC: 81.0000 mg | DELAYED_RELEASE_TABLET | Freq: Every morning | ORAL | Status: DC

## 2014-02-04 MED ORDER — SODIUM CHLORIDE 0.9 % IJ SOLN
3.0000 mL | Freq: Two times a day (BID) | INTRAMUSCULAR | Status: DC
  Administered 2014-02-05: 3 mL via INTRAVENOUS
  Administered 2014-02-05: 10:00:00 via INTRAVENOUS
  Administered 2014-02-06 – 2014-02-07 (×3): 3 mL via INTRAVENOUS

## 2014-02-04 MED ORDER — DORZOLAMIDE HCL 2 % OP SOLN
1.0000 [drp] | Freq: Every day | OPHTHALMIC | Status: DC
  Administered 2014-02-05: 1 [drp] via OPHTHALMIC
  Filled 2014-02-04: qty 10

## 2014-02-04 MED ORDER — SODIUM CHLORIDE 0.9 % IV SOLN: 250.0000 mL | INTRAVENOUS | Status: DC | PRN

## 2014-02-04 MED ORDER — ASPIRIN EC 81 MG PO TBEC
81.0000 mg | DELAYED_RELEASE_TABLET | Freq: Every day | ORAL | Status: DC
  Administered 2014-02-05: 81 mg via ORAL
  Filled 2014-02-04: qty 1

## 2014-02-04 MED ORDER — CLOPIDOGREL BISULFATE 300 MG PO TABS
600.0000 mg | ORAL_TABLET | Freq: Once | ORAL | Status: AC
  Administered 2014-02-05: 600 mg via ORAL
  Filled 2014-02-04: qty 2

## 2014-02-04 MED ORDER — HEPARIN (PORCINE) IN NACL 100-0.45 UNIT/ML-% IJ SOLN
1300.0000 [IU]/h | INTRAMUSCULAR | Status: DC
  Administered 2014-02-04: 1000 [IU]/h via INTRAVENOUS
  Filled 2014-02-04 (×2): qty 250

## 2014-02-04 MED ORDER — INSULIN ASPART 100 UNIT/ML ~~LOC~~ SOLN: 0.0000 [IU] | SUBCUTANEOUS | Status: DC

## 2014-02-04 MED ORDER — INSULIN ASPART 100 UNIT/ML ~~LOC~~ SOLN: 0.0000 [IU] | Freq: Three times a day (TID) | SUBCUTANEOUS | Status: DC

## 2014-02-04 MED ORDER — NITROGLYCERIN 0.4 MG SL SUBL: 0.4000 mg | SUBLINGUAL_TABLET | SUBLINGUAL | Status: DC | PRN

## 2014-02-04 MED ORDER — MORPHINE SULFATE 4 MG/ML IJ SOLN
4.0000 mg | Freq: Once | INTRAMUSCULAR | Status: AC
  Administered 2014-02-04: 4 mg via INTRAVENOUS
  Filled 2014-02-04: qty 1

## 2014-02-04 MED ORDER — NITROGLYCERIN IN D5W 200-5 MCG/ML-% IV SOLN: 2.0000 ug/min | INTRAVENOUS | Status: DC

## 2014-02-04 MED ORDER — LEVOTHYROXINE SODIUM 25 MCG PO TABS
25.0000 ug | ORAL_TABLET | Freq: Every day | ORAL | Status: DC
  Administered 2014-02-05: 25 ug via ORAL
  Filled 2014-02-04 (×2): qty 1

## 2014-02-04 MED ORDER — CLOPIDOGREL BISULFATE 75 MG PO TABS
75.0000 mg | ORAL_TABLET | Freq: Every day | ORAL | Status: DC
  Administered 2014-02-05 – 2014-02-07 (×3): 75 mg via ORAL
  Filled 2014-02-04 (×4): qty 1

## 2014-02-04 MED ORDER — CARVEDILOL 6.25 MG PO TABS
6.2500 mg | ORAL_TABLET | Freq: Every morning | ORAL | Status: DC
  Administered 2014-02-05 – 2014-02-06 (×2): 6.25 mg via ORAL
  Filled 2014-02-04 (×2): qty 1

## 2014-02-04 MED ORDER — NITROGLYCERIN IN D5W 200-5 MCG/ML-% IV SOLN
INTRAVENOUS | Status: AC
  Administered 2014-02-04: 50000 ug
  Filled 2014-02-04: qty 250

## 2014-02-04 MED ORDER — INSULIN ASPART 100 UNIT/ML ~~LOC~~ SOLN
0.0000 [IU] | Freq: Every day | SUBCUTANEOUS | Status: DC
  Administered 2014-02-05: 2 [IU] via SUBCUTANEOUS
  Administered 2014-02-05 – 2014-02-06 (×2): 3 [IU] via SUBCUTANEOUS

## 2014-02-04 MED ORDER — LATANOPROST 0.005 % OP SOLN
1.0000 [drp] | Freq: Every day | OPHTHALMIC | Status: DC
  Administered 2014-02-05 – 2014-02-06 (×3): 1 [drp] via OPHTHALMIC
  Filled 2014-02-04: qty 2.5

## 2014-02-04 MED ORDER — ACETAMINOPHEN 325 MG PO TABS: 650.0000 mg | ORAL_TABLET | ORAL | Status: DC | PRN

## 2014-02-04 MED ORDER — METOPROLOL TARTRATE 1 MG/ML IV SOLN
5.0000 mg | Freq: Once | INTRAVENOUS | Status: AC
  Administered 2014-02-04: 5 mg via INTRAVENOUS
  Filled 2014-02-04: qty 5

## 2014-02-04 MED ORDER — ASPIRIN 300 MG RE SUPP
300.0000 mg | RECTAL | Status: AC
  Filled 2014-02-04: qty 1

## 2014-02-04 MED ORDER — ONDANSETRON HCL 4 MG/2ML IJ SOLN: 4.0000 mg | Freq: Four times a day (QID) | INTRAMUSCULAR | Status: DC | PRN

## 2014-02-04 MED ORDER — ATORVASTATIN CALCIUM 40 MG PO TABS: 40.0000 mg | ORAL_TABLET | Freq: Every day | ORAL | Status: DC

## 2014-02-04 MED ORDER — ACETAMINOPHEN 325 MG PO TABS: 650.0000 mg | ORAL_TABLET | Freq: Four times a day (QID) | ORAL | Status: DC | PRN

## 2014-02-04 MED ORDER — HEPARIN (PORCINE) IN NACL 100-0.45 UNIT/ML-% IJ SOLN
1000.0000 [IU]/h | INTRAMUSCULAR | Status: DC
  Filled 2014-02-04: qty 250

## 2014-02-04 MED ORDER — NITROGLYCERIN 0.4 MG SL SUBL
0.4000 mg | SUBLINGUAL_TABLET | Freq: Once | SUBLINGUAL | Status: AC
  Administered 2014-02-04: 0.4 mg via SUBLINGUAL

## 2014-02-04 MED ORDER — HEPARIN BOLUS VIA INFUSION
4000.0000 [IU] | Freq: Once | INTRAVENOUS | Status: AC
  Administered 2014-02-04: 4000 [IU] via INTRAVENOUS

## 2014-02-04 MED ORDER — SODIUM CHLORIDE 0.9 % IJ SOLN: 3.0000 mL | INTRAMUSCULAR | Status: DC | PRN

## 2014-02-04 MED ORDER — HEPARIN SODIUM (PORCINE) 5000 UNIT/ML IJ SOLN: 60.0000 [IU]/kg | Freq: Once | INTRAMUSCULAR | Status: DC

## 2014-02-04 NOTE — H&P (Signed)
Physician History and Physical    [name] MRN: [id number] DOB/AGE: Dec 12, 1925 80 y.o. Admit date: 02/04/2014  Primary Cardiologist: Dr. Ron Parker  HPI: 80 yo with history of CAD s/p CABG, ischemic cardiomyopathy, and HTN was admitted with chest pain.  Patient had an MI in 3/13 with thrombotic occlusion of SVG-OM3.  This could not be effectively reperfused.  Since then, he has done well.  Prior to today, no chest pain or exertional dyspnea. Of note, he has had a chronic gum infection and stopped both amlodipine and Plavix a couple of weeks ago.  Plavix was stopped for potential gum surgery, now sounds like this will not be until November.  Today, patient went to play golf after supper.  Around 5:30 while playing golf, he developed substernal chest aching.  He tried to keep playing but eventually went home. At home, the pain worsened considerably so he went to the ER.  He continued to have pain in the ER until NTG gtt was started.  Currently, he is CP-free.   Cardiac enzymes were negative initially.  ECG showed RBBB with 2 mm ST depression V1-V3 which was a change from the prior ECG (though HR also higher).    Review of systems complete and found to be negative unless listed above   PMH: 1. CAD: s/p CABG 1996.  Had BMS to SVG-OM3 in 2008. NSTEMI 3/13 with patent LIMA-LAD, known occluded SVG-D, and new occlusion of SVG-OM3 with extensive thrombus, unable to effectively reperfuse.   2. HTN 3. Hyperlipidemia 4. Hypothyroidism 5. RBBB (chronic) 6. Type II diabetes 7. H/o shingles 8. H/o back surgery 9. Ischemic cardiomyopathy: Echo (3/13) with EF 45-50%, mild MR, mild AI.  10. CKD  Family History  Problem Relation Age of Onset  . Diabetes Sister   . Heart attack Father 96  . Lung cancer Sister   . Stroke Neg Hx   . Diabetes Brother     History   Social History  . Marital Status: Married    Spouse Name: N/A    Number of Children: N/A  . Years of Education: N/A    Occupational History  . Not on file.   Social History Main Topics  . Smoking status: Former Smoker -- 1.00 packs/day for 5 years    Quit date: 05/17/1949  . Smokeless tobacco: Never Used     Comment: Quit at age 6  . Alcohol Use: No  . Drug Use: No  . Sexual Activity: Not on file   Other Topics Concern  . Not on file   Social History Narrative  . No narrative on file     Prescriptions prior to admission  Medication Sig Dispense Refill  . amLODipine (NORVASC) 10 MG tablet Take 10 mg by mouth daily.      Marland Kitchen aspirin EC 81 MG tablet Take 81 mg by mouth every morning.      . benazepril (LOTENSIN) 40 MG tablet Take 40 mg by mouth daily.      . carvedilol (COREG) 3.125 MG tablet Take 3.125 mg by mouth every morning.       . chlorhexidine (PERIDEX) 0.12 % solution Use as directed 15 mLs in the mouth or throat 2 (two) times daily.       . cholecalciferol (VITAMIN D) 1000 UNITS tablet Take 2,000 Units by mouth daily.      . dorzolamide (TRUSOPT) 2 % ophthalmic solution 1 drop both eyes in the evening      .  furosemide (LASIX) 40 MG tablet Take 20 mg by mouth daily as needed for fluid.      Marland Kitchen insulin detemir (LEVEMIR) 100 UNIT/ML injection Inject 48 Units into the skin at bedtime.      . isosorbide mononitrate (IMDUR) 30 MG 24 hr tablet Take 30 mg by mouth daily.      Marland Kitchen levothyroxine (SYNTHROID, LEVOTHROID) 25 MCG tablet Take 1 tablet (25 mcg total) by mouth daily.  90 tablet  1  . LUMIGAN 0.03 % ophthalmic solution Place into both eyes at bedtime and may repeat dose one time if needed.       . nitroGLYCERIN (NITROSTAT) 0.4 MG SL tablet Place 0.4 mg under the tongue every 5 (five) minutes as needed for chest pain.      . NONFORMULARY OR COMPOUNDED ITEM Diabetic testing supply Check glucose once daily if possible Fasting or morning glucose recommended M, W, F, & Sun if possible Goal= 100-150 Glucose 2 hours after breakfast Tue, after lunch Th & 2 hrs after eve meal Sat if possible Goal =  < 180  1 each  0  . rosuvastatin (CRESTOR) 10 MG tablet Take 10 mg by mouth 2 (two) times a week. Takes on Sundays and Wednesdays      . acetaminophen (TYLENOL) 325 MG tablet Take 650 mg by mouth every 6 (six) hours as needed for mild pain or moderate pain.       Marland Kitchen clopidogrel (PLAVIX) 75 MG tablet Take 75 mg by mouth daily.      . nitroGLYCERIN (NITROSTAT) 0.4 MG SL tablet Place 1 tablet (0.4 mg total) under the tongue every 5 (five) minutes x 3 doses as needed for chest pain (up to 3 doses).  25 tablet  3    Physical Exam: Blood pressure 158/90, pulse 71, temperature 97.8 F (36.6 C), temperature source Oral, resp. rate 17, SpO2 98.00%.  General: NAD Neck: No JVD, no thyromegaly or thyroid nodule.  Lungs: Clear to auscultation bilaterally with normal respiratory effort. CV: Nondisplaced PMI.  Heart regular S1/S2, no S3/S4, no murmur.  No peripheral edema.  No carotid bruit.  Normal pedal pulses.  Abdomen: Soft, nontender, no hepatosplenomegaly, no distention.  Skin: Intact without lesions or rashes.  Neurologic: Alert and oriented x 3.  Psych: Normal affect. Extremities: No clubbing or cyanosis.  HEENT: Normal.   Labs:   Lab Results  Component Value Date   WBC 6.9 02/04/2014   HGB 15.0 02/04/2014   HCT 43.1 02/04/2014   MCV 91.5 02/04/2014   PLT 152 02/04/2014    Recent Labs Lab 02/04/14 1935  NA 139  K 3.9  CL 99  CO2 23  BUN 25*  CREATININE 1.39*  CALCIUM 9.3  GLUCOSE 376*   Lab Results  Component Value Date   CKTOTAL 169 06/28/2013   CKMB 141.9* 08/04/2011   TROPONINI <0.30 02/04/2014     Radiology: - CXR: No acute changes  EKG: NSR, RBBB, LAFB, V1-V3 deep T wave inversions more marked than prior (HR also faster)  ASSESSMENT AND PLAN: 80 yo with history of CAD s/p CABG, ischemic cardiomyopathy, and HTN was admitted with chest pain concerning for unstable angina.  1. CAD: s/p CABG.  Concern for unstable angina.  Chest pain began while playing golf. First CP since MI  in 3/13. Initial troponin negative but ECG shows changes.  Currently CP-free.  - Continue ASA, statin.  - Will restart Plavix with 600 mg load then 75 mg daily.  He was  off Plavix for gum surgery, which apparently is not going to be until 11/15.  - Continue NTG gtt.  - Cycle troponin - Would favor LHC in am.  2. CKD: Creatinine 1.39 which is actually fairly stable compared to the past.  Will hydrate pre-cath and hold ACEI.  Will use NTG gtt for BP control.  3. Ischemic cardiomyopathy: EF 45-50% on last echo in 3/13.  He is not volume overloaded on exam.  Will repeat echo.   Signed: Loralie Champagne 02/04/2014, 11:14 PM

## 2014-02-04 NOTE — ED Provider Notes (Addendum)
CSN: [id number]     Arrival date & time 02/04/14  1911 History   This chart was scribed for Nat Christen, MD by Evelene Croon, ED Scribe. This patient was seen in room APA11/APA11 and the patient's care was started 7:19 PM.  Chief Complaint  Patient presents with  . Chest Pain     The history is provided by the patient and the spouse. No language interpreter was used.    HPI Comments:  [name] is a 80 y.o. male with an extensive cardiac hx who presents to the Emergency Department complaining of constant central CP that started about 3 hrs PTA. Pt reports an increase in pain with deep breath. Denies diaphoresis and SOB. Pt took NTG PTA  and 325mg  ASA about 40 min PTA with no change in pain. Pt had his first MI 20 years ago at age 37 and his second MI 3 years ago at age 44 and had a stent placed at this time. He has also had a CABG between his first MI and his second. He initially likened the pain to indigestion or his sternal arthritis but pain has gradually worsened since onset.       Past Medical History  Diagnosis Date  . CAD (coronary artery disease)     Bare-metal stent to SVG circumflex 2008 / nuclear March, 2012, scar distal anterior, anterolateral and apical, no ischemia  . Hypertension   . Ejection fraction     45%, echo, March, 2012,   /  Ejection fraction after his MI March, 2013 is 45%. There is posterior basal akinesis with abnormal septal motion.  . Aortic insufficiency     Moderate, echo, March, 2012  . Mitral regurgitation     Moderate, echo, March, 2012  . Pulmonary hypertension     Echo, March, 2012, 42 mmHg  . Hx of CABG   . Diabetes mellitus   . Dyslipidemia   . Hypothyroidism   . Bradycardia     Sinus bradycardia  . RBBB (right bundle branch block with left anterior fascicular block)     New July, 2010  . Leg cramps     Nighttime  . Shingles     Severe pain to right flank treated  . Ischemia     .Marland KitchenMarland KitchenEF 42%  . Hyperlipidemia   . Dizziness       12/2009 &12/ 2012  . Abnormal LFTs     LFTs were abnormal with acute MI March, 2013, improving at the time of discharge  . Myocardial infarction 3/19-27/2013    ARF, elevated LFTs  . Systolic CHF   . Elevated CPK     2013, now in 2014 patient on very low dose Crestor   Past Surgical History  Procedure Laterality Date  . Coronary artery bypass graft  1996    5 vessels  . Back surgery  1999    [facility] sx- 1999  . Spinal fusion  2000    scar tissue, plate insertion/fusion @ [facility] - 2000  . Cardiac surgery    . Cardiac catheterization  2013    occluded graft; Dr Lia Foyer   Family History  Problem Relation Age of Onset  . Diabetes Sister   . Heart attack Father 94  . Lung cancer Sister   . Stroke Neg Hx   . Diabetes Brother    History  Substance Use Topics  . Smoking status: Former Smoker -- 1.00 packs/day for 5 years    Quit date: 05/17/1949  .  Smokeless tobacco: Never Used     Comment: Quit at age 80  . Alcohol Use: No    Review of Systems  At least 10pt or greater review of systems completed and are negative except where specified in the HPI.     Allergies  Atorvastatin; Folic acid; Nicardipine hcl; and Pravastatin  Home Medications   Prior to Admission medications   Medication Sig Start Date End Date Taking? Authorizing Provider  acetaminophen (TYLENOL) 325 MG tablet Take 650 mg by mouth as needed.     Historical Provider, MD  amLODipine (NORVASC) 10 MG tablet On hold 01/18/14   Carlena Bjornstad, MD  aspirin 81 MG tablet Take 81 mg by mouth daily.    Historical Provider, MD  benazepril (LOTENSIN) 40 MG tablet Take 40 mg by mouth daily.    Historical Provider, MD  carvedilol (COREG) 3.125 MG tablet Take 3.125 mg by mouth 2 (two) times daily with a meal.    Historical Provider, MD  chlorhexidine (PERIDEX) 0.12 % solution 15 mLs by Mouth Rinse route. 01/30/14   Historical Provider, MD  cholecalciferol (VITAMIN D) 1000 UNITS tablet Take 2,000 Units by mouth daily.     Historical Provider, MD  clopidogrel (PLAVIX) 75 MG tablet TAKE 1 TABLET BY MOUTH EVERY DAY WITH BREAKFAST 08/20/13   Carlena Bjornstad, MD  dorzolamide (TRUSOPT) 2 % ophthalmic solution 1 drop both eyes in the evening 03/14/11   Historical Provider, MD  furosemide (LASIX) 40 MG tablet TAKE 1 TABLET EVERY DAY AND MAY TAKE AN EXTRA TABLET DAILY IF WEIGHT INCREASES 01/17/14   Carlena Bjornstad, MD  Insulin Detemir (LEVEMIR) 100 UNIT/ML Pen INJECT 48 UNITS SUBCUTANEOUSLY AT BEDTIME 10/11/13   Hendricks Limes, MD  Insulin Pen Needle 32G X 6 MM MISC Use with Levemir pen, 12 units daily in the evening, DX:250.02 08/22/12   Hendricks Limes, MD  isosorbide mononitrate (IMDUR) 30 MG 24 hr tablet Take 1 tablet (30 mg total) by mouth daily. 01/18/14   Carlena Bjornstad, MD  levothyroxine (SYNTHROID, LEVOTHROID) 25 MCG tablet Take 1 tablet (25 mcg total) by mouth daily. 10/22/13   Hendricks Limes, MD  LUMIGAN 0.03 % ophthalmic solution 1 drop both eyes in the am 02/26/11   Historical Provider, MD  NONFORMULARY OR COMPOUNDED ITEM Diabetic testing supply Check glucose once daily if possible Fasting or morning glucose recommended M, W, F, & Sun if possible Goal= 100-150 Glucose 2 hours after breakfast Tue, after lunch Th & 2 hrs after eve meal Sat if possible Goal = < 180 07/13/12   Hendricks Limes, MD  rosuvastatin (CRESTOR) 10 MG tablet 1 pill every Sunday & Weds 11/20/13   Hendricks Limes, MD   BP 211/103  Pulse 100  Temp(Src) 97.8 F (36.6 C) (Oral)  SpO2 100% Physical Exam  Nursing note and vitals reviewed. Constitutional: He is oriented to person, place, and time. He appears well-developed and well-nourished.  HENT:  Head: Normocephalic and atraumatic.  Eyes: Conjunctivae and EOM are normal. Pupils are equal, round, and reactive to light.  Neck: Normal range of motion. Neck supple.  Cardiovascular: Normal rate, regular rhythm and normal heart sounds.   Abdominal: Soft. Bowel sounds are normal.  Musculoskeletal:  Normal range of motion.  Neurological: He is alert and oriented to person, place, and time.  Skin: Skin is dry. There is pallor.  Psychiatric: He has a normal mood and affect. His behavior is normal.    ED Course  Procedures  DIAGNOSTIC STUDIES:  Oxygen Saturation is 100% on 2L/[place], normal by my interpretation.    COORDINATION OF CARE:  7:34 PM Will order IV nitro and morphine. Discussed treatment plan and plan to transfer with pt and family at bedside and they agreed to plan.  Labs Review Labs Reviewed  BASIC METABOLIC PANEL  CBC WITH DIFFERENTIAL  TROPONIN I    Imaging Review No results found.   EKG Interpretation None      Date: 02/04/2014  Rate: 102  Rhythm: sinus tachy  QRS Axis: left  Intervals: normal  ST/T Wave abnormalities: ST dep V2,3  Conduction Disutrbances: RBBB  Narrative Interpretation: unremarkable   CRITICAL CARE Performed by: Nat Christen  ?  Total critical care time: 35  Critical care time was exclusive of separately billable procedures and treating other patients.  Critical care was necessary to treat or prevent imminent or life-threatening deterioration.  Critical care was time spent personally by me on the following activities: development of treatment plan with patient and/or surrogate as well as nursing, discussions with consultants, evaluation of patient's response to treatment, examination of patient, obtaining history from patient or surrogate, ordering and performing treatments and interventions, ordering and review of laboratory studies, ordering and review of radiographic studies, pulse oximetry and re-evaluation of patient's condition.   MDM   Final diagnoses:  Chest pain, unspecified chest pain type    A patient with known cardiac disease presents with indigestion sensation anterior chest.  EKG shows sinus tachycardia rate 102 with a right bundle-branch block and significant ST depression in V2 V3.  IV morphine, IV Lopressor,  IV nitroglycerin, IV heparin. Patient given 325 mg aspirin at home. Discussed with Dr. Adora Fridge.  Patient will be transferred to [facility].  Dr Aundra Dubin accepting     Nat Christen, MD 02/04/14 1949  Nat Christen, MD 02/04/14 [phone]

## 2014-02-04 NOTE — ED Notes (Signed)
Pt chest pain trending down with nitro gtt. Increased rate to 15 mcg for better response. Pt tolerating well.

## 2014-02-04 NOTE — ED Notes (Signed)
Updated EDP on pt glucose and anion gap per BMP, no insulin ordered for transfer to [facility].

## 2014-02-04 NOTE — ED Notes (Addendum)
Pt c/o chest pain x 2 hours that radiates down left arm. Pt states it feels like his chest is going to explode. Pt took one nitro and 325mg  asa prior to arrival.

## 2014-02-04 NOTE — Progress Notes (Signed)
ANTICOAGULATION CONSULT NOTE - Initial Consult  Pharmacy Consult for Heparin Indication: chest pain/ACS  Allergies  Allergen Reactions  . Atorvastatin     REACTION: myalgias  . Folic Acid     REACTION: Face burns  . Nicardipine Hcl     REACTION: Gingival hypertrophy  . Pravastatin     myalgias    Patient Measurements:   Heparin Dosing Weight: 82 kg  Vital Signs: Temp: 97.8 F (36.6 C) (09/21 1932) Temp src: Oral (09/21 1932) BP: 211/103 mmHg (09/21 1932) Pulse Rate: 100 (09/21 1932)  Labs:  Recent Labs  02/04/14 1935  HGB 15.0  HCT 43.1  PLT 152    The CrCl is unknown because both a height and weight (above a minimum accepted value) are required for this calculation.   Medical History: Past Medical History  Diagnosis Date  . CAD (coronary artery disease)     Bare-metal stent to SVG circumflex 2008 / nuclear March, 2012, scar distal anterior, anterolateral and apical, no ischemia  . Hypertension   . Ejection fraction     45%, echo, March, 2012,   /  Ejection fraction after his MI March, 2013 is 45%. There is posterior basal akinesis with abnormal septal motion.  . Aortic insufficiency     Moderate, echo, March, 2012  . Mitral regurgitation     Moderate, echo, March, 2012  . Pulmonary hypertension     Echo, March, 2012, 42 mmHg  . Hx of CABG   . Diabetes mellitus   . Dyslipidemia   . Hypothyroidism   . Bradycardia     Sinus bradycardia  . RBBB (right bundle branch block with left anterior fascicular block)     New July, 2010  . Leg cramps     Nighttime  . Shingles     Severe pain to right flank treated  . Ischemia     .Marland KitchenMarland KitchenEF 42%  . Hyperlipidemia   . Dizziness      12/2009 &12/ 2012  . Abnormal LFTs     LFTs were abnormal with acute MI March, 2013, improving at the time of discharge  . Myocardial infarction 3/19-27/2013    ARF, elevated LFTs  . Systolic CHF   . Elevated CPK     2013, now in 2014 patient on very low dose Crestor     Medications:  Scheduled:  . nitroGLYCERIN  0.4 mg Sublingual Once    Assessment: 80 y.o. Male extensive cardiac history. Two MI, stent and CABG PTA medications reviewed Labs reviewed Patient to be transferred to [facility]  Goal of Therapy:  Heparin level 0.3-0.7 units/ml Monitor platelets by anticoagulation protocol: Yes   Plan:  Give 4000 units bolus x 1 Start heparin infusion at 1000 units/hr Check anti-Xa level in 8 hours and daily while on heparin Continue to monitor H&H and platelets  Abner Greenspan, Kenyon Eichelberger Bennett 02/04/2014,8:00 PM

## 2014-02-05 ENCOUNTER — Encounter (HOSPITAL_COMMUNITY)
Admission: EM | Disposition: A | Discharge status: Home / Self Care | Payer: Self-pay | Source: Home / Self Care | Attending: Cardiology

## 2014-02-05 DIAGNOSIS — I359 Nonrheumatic aortic valve disorder, unspecified: Secondary | ICD-10-CM

## 2014-02-05 DIAGNOSIS — E039 Hypothyroidism, unspecified: Secondary | ICD-10-CM

## 2014-02-05 DIAGNOSIS — I2510 Atherosclerotic heart disease of native coronary artery without angina pectoris: Secondary | ICD-10-CM

## 2014-02-05 DIAGNOSIS — I498 Other specified cardiac arrhythmias: Secondary | ICD-10-CM

## 2014-02-05 HISTORY — PX: LEFT HEART CATHETERIZATION WITH CORONARY/GRAFT ANGIOGRAM: SHX5450

## 2014-02-05 LAB — CBC
HEMATOCRIT: 40.1 % (ref 39.0–52.0)
Hemoglobin: 13.4 g/dL (ref 13.0–17.0)
MCH: 31.3 pg (ref 26.0–34.0)
MCHC: 33.4 g/dL (ref 30.0–36.0)
MCV: 93.7 fL (ref 78.0–100.0)
Platelets: 145 10*3/uL — ABNORMAL LOW (ref 150–400)
RBC: 4.28 MIL/uL (ref 4.22–5.81)
RDW: 13.6 % (ref 11.5–15.5)
WBC: 6.8 10*3/uL (ref 4.0–10.5)

## 2014-02-05 LAB — GLUCOSE, CAPILLARY
GLUCOSE-CAPILLARY: 310 mg/dL — AB (ref 70–99)
GLUCOSE-CAPILLARY: 266 mg/dL — AB (ref 70–99)
GLUCOSE-CAPILLARY: 235 mg/dL — AB (ref 70–99)
Glucose-Capillary: 238 mg/dL — ABNORMAL HIGH (ref 70–99)
Glucose-Capillary: 272 mg/dL — ABNORMAL HIGH (ref 70–99)

## 2014-02-05 LAB — BASIC METABOLIC PANEL
ANION GAP: 12 (ref 5–15)
BUN: 22 mg/dL (ref 6–23)
CHLORIDE: 102 meq/L (ref 96–112)
CO2: 24 mEq/L (ref 19–32)
Calcium: 9.1 mg/dL (ref 8.4–10.5)
Creatinine, Ser: 1.09 mg/dL (ref 0.50–1.35)
GFR, EST AFRICAN AMERICAN: 69 mL/min — AB (ref >90)
GFR, EST NON AFRICAN AMERICAN: 60 mL/min — AB (ref >90)
Glucose, Bld: 327 mg/dL — ABNORMAL HIGH (ref 70–99)
POTASSIUM: 4.6 meq/L (ref 3.7–5.3)
SODIUM: 138 meq/L (ref 137–147)

## 2014-02-05 LAB — LIPID PANEL
CHOLESTEROL: 162 mg/dL (ref 0–200)
HDL: 38 mg/dL — AB (ref >39)
LDL Cholesterol: 89 mg/dL (ref 0–99)
Total CHOL/HDL Ratio: 4.3 RATIO
Triglycerides: 176 mg/dL — ABNORMAL HIGH (ref <150)
VLDL: 35 mg/dL (ref 0–40)

## 2014-02-05 LAB — HEPARIN LEVEL (UNFRACTIONATED)

## 2014-02-05 LAB — MRSA PCR SCREENING: MRSA BY PCR: NEGATIVE

## 2014-02-05 LAB — TSH: TSH: 9.74 u[IU]/mL — ABNORMAL HIGH (ref 0.350–4.500)

## 2014-02-05 LAB — POCT ACTIVATED CLOTTING TIME: ACTIVATED CLOTTING TIME: 84 s

## 2014-02-05 LAB — TROPONIN I
TROPONIN I: 5.61 ng/mL — AB (ref <0.30)
TROPONIN I: 5.73 ng/mL — AB (ref <0.30)

## 2014-02-05 SURGERY — LEFT HEART CATHETERIZATION WITH CORONARY/GRAFT ANGIOGRAM
Anesthesia: LOCAL

## 2014-02-05 MED ORDER — ONDANSETRON HCL 4 MG/2ML IJ SOLN: 4.0000 mg | Freq: Four times a day (QID) | INTRAMUSCULAR | Status: DC | PRN

## 2014-02-05 MED ORDER — LIDOCAINE HCL (PF) 1 % IJ SOLN
INTRAMUSCULAR | Status: AC
Start: 2014-02-05 — End: 2014-02-05
  Filled 2014-02-05: qty 30

## 2014-02-05 MED ORDER — ROSUVASTATIN CALCIUM 10 MG PO TABS
10.0000 mg | ORAL_TABLET | Freq: Every day | ORAL | Status: DC
  Administered 2014-02-05 – 2014-02-06 (×2): 10 mg via ORAL
  Filled 2014-02-05 (×2): qty 1

## 2014-02-05 MED ORDER — SODIUM CHLORIDE 0.9 % IV SOLN
1.0000 mL/kg/h | INTRAVENOUS | Status: DC
  Administered 2014-02-05: 1 mL/kg/h via INTRAVENOUS

## 2014-02-05 MED ORDER — SODIUM CHLORIDE 0.9 % IV SOLN: 250.0000 mL | INTRAVENOUS | Status: DC | PRN

## 2014-02-05 MED ORDER — HEPARIN (PORCINE) IN NACL 2-0.9 UNIT/ML-% IJ SOLN
INTRAMUSCULAR | Status: AC
  Filled 2014-02-05: qty 1000

## 2014-02-05 MED ORDER — ACETAMINOPHEN 325 MG PO TABS
650.0000 mg | ORAL_TABLET | ORAL | Status: DC | PRN
  Filled 2014-02-05: qty 2

## 2014-02-05 MED ORDER — DORZOLAMIDE HCL 2 % OP SOLN
1.0000 [drp] | Freq: Every day | OPHTHALMIC | Status: DC
  Administered 2014-02-07: 1 [drp] via OPHTHALMIC
  Filled 2014-02-05 (×2): qty 10

## 2014-02-05 MED ORDER — NITROGLYCERIN 1 MG/10 ML FOR IR/CATH LAB
INTRA_ARTERIAL | Status: AC
  Filled 2014-02-05: qty 10

## 2014-02-05 MED ORDER — MIDAZOLAM HCL 2 MG/2ML IJ SOLN
INTRAMUSCULAR | Status: AC
  Filled 2014-02-05: qty 2

## 2014-02-05 MED ORDER — SODIUM CHLORIDE 0.9 % IJ SOLN
3.0000 mL | Freq: Two times a day (BID) | INTRAMUSCULAR | Status: DC
Start: 2014-02-05 — End: 2014-02-05
  Administered 2014-02-05: 10:00:00 via INTRAVENOUS
  Administered 2014-02-05: 3 mL via INTRAVENOUS

## 2014-02-05 MED ORDER — SODIUM CHLORIDE 0.9 % IV SOLN
INTRAVENOUS | Status: DC
  Administered 2014-02-05: 16:00:00 via INTRAVENOUS

## 2014-02-05 MED ORDER — FENTANYL CITRATE 0.05 MG/ML IJ SOLN
INTRAMUSCULAR | Status: AC
  Filled 2014-02-05: qty 2

## 2014-02-05 MED ORDER — LEVOTHYROXINE SODIUM 50 MCG PO TABS
50.0000 ug | ORAL_TABLET | Freq: Every day | ORAL | Status: DC
  Administered 2014-02-06 – 2014-02-07 (×2): 50 ug via ORAL
  Filled 2014-02-05 (×3): qty 1

## 2014-02-05 MED ORDER — HEPARIN (PORCINE) IN NACL 100-0.45 UNIT/ML-% IJ SOLN
1300.0000 [IU]/h | INTRAMUSCULAR | Status: DC
  Administered 2014-02-05 – 2014-02-07 (×2): 1300 [IU]/h via INTRAVENOUS
  Filled 2014-02-05 (×5): qty 250

## 2014-02-05 MED ORDER — SODIUM CHLORIDE 0.9 % IJ SOLN: 3.0000 mL | INTRAMUSCULAR | Status: DC | PRN

## 2014-02-05 MED ORDER — HEPARIN BOLUS VIA INFUSION
2000.0000 [IU] | Freq: Once | INTRAVENOUS | Status: DC
  Filled 2014-02-05: qty 2000

## 2014-02-05 MED ORDER — INSULIN ASPART 100 UNIT/ML ~~LOC~~ SOLN
0.0000 [IU] | Freq: Three times a day (TID) | SUBCUTANEOUS | Status: DC
  Administered 2014-02-05: 11 [IU] via SUBCUTANEOUS
  Administered 2014-02-05: 5 [IU] via SUBCUTANEOUS
  Administered 2014-02-06: 8 [IU] via SUBCUTANEOUS
  Administered 2014-02-06 (×2): 5 [IU] via SUBCUTANEOUS
  Administered 2014-02-07: 3 [IU] via SUBCUTANEOUS

## 2014-02-05 MED ORDER — ASPIRIN 81 MG PO CHEW
81.0000 mg | CHEWABLE_TABLET | ORAL | Status: AC
  Administered 2014-02-05: 81 mg via ORAL
  Filled 2014-02-05: qty 1

## 2014-02-05 MED ORDER — ASPIRIN EC 81 MG PO TBEC
81.0000 mg | DELAYED_RELEASE_TABLET | Freq: Every day | ORAL | Status: DC
  Administered 2014-02-06 – 2014-02-07 (×2): 81 mg via ORAL
  Filled 2014-02-05 (×2): qty 1

## 2014-02-05 NOTE — H&P (View-Only) (Signed)
PROGRESS NOTE  Subjective:   Mr. Space is a 80-year-old gentleman with history of coronary artery disease. He stopped his Plavix several weeks ago for potential gum surgery. [place] and started having episodes of chest discomfort. He came into the hospital and was found to have positive enzymes consistent with a non-ST segment elevation myocardial infarction. He scheduled for cardiac catheterization today.  His family told me that the last time he had a Had significant altered mental status. They think it was perhaps one of his medications that he received during heart catheterization.    Objective:    Vital Signs:   Temp:  [97.8 F (36.6 C)-98.6 F (37 C)] 98.6 F (37 C) (09/22 0746) Pulse Rate:  [55-100] 67 (09/22 0946) Resp:  [10-20] 13 (09/22 0746) BP: (145-211)/(68-110) 161/75 mmHg (09/22 0946) SpO2:  [94 %-100 %] 97 % (09/22 0746) Weight:  [182 lb 12.2 oz (82.9 kg)] 182 lb 12.2 oz (82.9 kg) (09/21 2300)  Last BM Date: 02/04/14   24-hour weight change: Weight change:   Weight trends: Filed Weights   02/04/14 2300  Weight: 182 lb 12.2 oz (82.9 kg)    Intake/Output:  09/21 0701 - 09/22 0700 In: 597.5 [P.O.:240; I.V.:357.5] Out: 450 [Urine:450] Total I/O In: 3 [I.V.:3] Out: -    Physical Exam: BP 161/75  Pulse 67  Temp(Src) 98.6 F (37 C) (Oral)  Resp 13  Ht 5\' 9"  (1.753 m)  Wt 182 lb 12.2 oz (82.9 kg)  BMI 26.98 kg/m2  SpO2 97%  Wt Readings from Last 3 Encounters:  02/04/14 182 lb 12.2 oz (82.9 kg)  02/04/14 182 lb 12.2 oz (82.9 kg)  12/13/13 180 lb 12.8 oz (82.01 kg)    General: Vital signs reviewed and noted.   Head: Normocephalic, atraumatic.  Eyes: conjunctivae/corneas clear.  EOM's intact.   Throat: normal  Neck:  normal   Lungs:    clear   Heart:  Rr, normal S1S2  Abdomen:  Soft, non-tender, non-distended    Extremities: Pulses are ok   Neurologic: A&O X3, CN II - XII are grossly intact.   Psych: Normal      Labs: BMET:  Recent Labs  02/04/14 1935 02/05/14 0740  NA 139 138  K 3.9 4.6  CL 99 102  CO2 23 24  GLUCOSE 376* 327*  BUN 25* 22  CREATININE 1.39* 1.09  CALCIUM 9.3 9.1    Liver function tests: No results found for this basename: AST, ALT, ALKPHOS, BILITOT, PROT, ALBUMIN,  in the last 72 hours No results found for this basename: LIPASE, AMYLASE,  in the last 72 hours  CBC:  Recent Labs  02/04/14 1935 02/05/14 0740  WBC 6.9 6.8  NEUTROABS 4.1  --   HGB 15.0 13.4  HCT 43.1 40.1  MCV 91.5 93.7  PLT 152 145*    Cardiac Enzymes:  Recent Labs  02/04/14 1935 02/05/14 0040 02/05/14 0740  TROPONINI <0.30 5.61* 5.73*    Coagulation Studies:  Recent Labs  02/04/14 2008  LABPROT 12.6  INR 0.94    Other: No components found with this basename: POCBNP,  No results found for this basename: DDIMER,  in the last 72 hours No results found for this basename: HGBA1C,  in the last 72 hours  Recent Labs  02/05/14 0740  CHOL 162  HDL 38*  LDLCALC 89  TRIG 176*  CHOLHDL 4.3    Recent Labs  02/05/14 0040  TSH  9.740*   No results found for this basename: VITAMINB12, FOLATE, FERRITIN, TIBC, IRON, RETICCTPCT,  in the last 72 hours   Other results:  EKG :  NSR with 1st degree AV block.  RBBB   Medications:    Infusions: . sodium chloride 1 mL/kg/hr (02/05/14 0436)  . heparin 1,000 Units/hr (02/04/14 2300)  . nitroGLYCERIN 30 mcg/min (02/05/14 0436)    Scheduled Medications: . aspirin EC  81 mg Oral Daily  . carvedilol  6.25 mg Oral q morning - 10a  . clopidogrel  75 mg Oral Q breakfast  . [START ON 02/06/2014] dorzolamide  1 drop Both Eyes Q breakfast  . heparin  2,000 Units Intravenous Once  . insulin aspart  0-15 Units Subcutaneous TID WC  . insulin aspart  0-5 Units Subcutaneous QHS  . latanoprost  1 drop Both Eyes QHS  . levothyroxine  25 mcg Oral QAC breakfast  . rosuvastatin  10 mg Oral Daily  . sodium chloride  3 mL Intravenous Q12H   . sodium chloride  3 mL Intravenous Q12H    Assessment/ Plan:   Active Problems:   Chest pain   Unstable angina  1. Non-ST segment elevation myocardial infarction: He presents with symptoms consistent with a non-ST segment elevation myocardial infarction. He has positive troponin level. We have restarted his Plavix. He'll be cathed Later today.  Continue heparin. Continue with the same medications.  2. Hyperlipidemia: Continue current dose of Crestor  3. Hypothyroidism: his TSH is elevated. We'll need to increase his Synthroid to 50 mcg and appeared to be followed as an outpatient  4. Diabetes mellitus-followed by his internal medicine doctor.   Disposition:  For cath + possible PCI  today.  He became very agitated following his last cath.     Length of Stay: 1  Thayer Headings, Brooke Bonito., MD, [facility] 02/05/2014, 10:31 AM Office [phone] Pager [phone]

## 2014-02-05 NOTE — Progress Notes (Signed)
Site area: right groin 5 french sheath  Site Prior to Removal:  Level 0  Pressure Applied For 20 MINUTES    Minutes Beginning at 1310  Manual:   Yes.    Patient Status During Pull:  stable  Post Pull Groin Site:  Level 0  Post Pull Instructions Given:  Yes.    Post Pull Pulses Present:  Yes.    Dressing Applied:  Yes.    Comments:  VS remain stable during sheath pull.  Pt denies any discomfort at this time

## 2014-02-05 NOTE — Progress Notes (Signed)
Rt femoral arterial sheath removed by SBrown.

## 2014-02-05 NOTE — Progress Notes (Signed)
PROGRESS NOTE  Subjective:   [name] is a 80-year-old gentleman with history of coronary artery disease. He stopped his Plavix several weeks ago for potential gum surgery. He was [place] and started having episodes of chest discomfort. He came into the hospital and was found to have positive enzymes consistent with a non-ST segment elevation myocardial infarction. He scheduled for cardiac catheterization today.  His family told me that the last time he had a Had significant altered mental status. They think it was perhaps one of his medications that he received during heart catheterization.    Objective:    Vital Signs:   Temp:  [97.8 F (36.6 C)-98.6 F (37 C)] 98.6 F (37 C) (09/22 0746) Pulse Rate:  [55-100] 67 (09/22 0946) Resp:  [10-20] 13 (09/22 0746) BP: (145-211)/(68-110) 161/75 mmHg (09/22 0946) SpO2:  [94 %-100 %] 97 % (09/22 0746) Weight:  [182 lb 12.2 oz (82.9 kg)] 182 lb 12.2 oz (82.9 kg) (09/21 2300)  Last BM Date: 02/04/14   24-hour weight change: Weight change:   Weight trends: Filed Weights   02/04/14 2300  Weight: 182 lb 12.2 oz (82.9 kg)    Intake/Output:  09/21 0701 - 09/22 0700 In: 597.5 [P.O.:240; I.V.:357.5] Out: 450 [Urine:450] Total I/O In: 3 [I.V.:3] Out: -    Physical Exam: BP 161/75  Pulse 67  Temp(Src) 98.6 F (37 C) (Oral)  Resp 13  Ht 5\' 9"  (1.753 m)  Wt 182 lb 12.2 oz (82.9 kg)  BMI 26.98 kg/m2  SpO2 97%  Wt Readings from Last 3 Encounters:  02/04/14 182 lb 12.2 oz (82.9 kg)  02/04/14 182 lb 12.2 oz (82.9 kg)  12/13/13 180 lb 12.8 oz (82.01 kg)    General: Vital signs reviewed and noted.   Head: Normocephalic, atraumatic.  Eyes: conjunctivae/corneas clear.  EOM's intact.   Throat: normal  Neck:  normal   Lungs:    clear   Heart:  Rr, normal S1S2  Abdomen:  Soft, non-tender, non-distended    Extremities: Pulses are ok   Neurologic: A&O X3, CN II - XII are grossly intact.   Psych: Normal      Labs: BMET:  Recent Labs  02/04/14 1935 02/05/14 0740  NA 139 138  K 3.9 4.6  CL 99 102  CO2 23 24  GLUCOSE 376* 327*  BUN 25* 22  CREATININE 1.39* 1.09  CALCIUM 9.3 9.1    Liver function tests: No results found for this basename: AST, ALT, ALKPHOS, BILITOT, PROT, ALBUMIN,  in the last 72 hours No results found for this basename: LIPASE, AMYLASE,  in the last 72 hours  CBC:  Recent Labs  02/04/14 1935 02/05/14 0740  WBC 6.9 6.8  NEUTROABS 4.1  --   HGB 15.0 13.4  HCT 43.1 40.1  MCV 91.5 93.7  PLT 152 145*    Cardiac Enzymes:  Recent Labs  02/04/14 1935 02/05/14 0040 02/05/14 0740  TROPONINI <0.30 5.61* 5.73*    Coagulation Studies:  Recent Labs  02/04/14 2008  LABPROT 12.6  INR 0.94    Other: No components found with this basename: POCBNP,  No results found for this basename: DDIMER,  in the last 72 hours No results found for this basename: HGBA1C,  in the last 72 hours  Recent Labs  02/05/14 0740  CHOL 162  HDL 38*  LDLCALC 89  TRIG 176*  CHOLHDL 4.3    Recent Labs  02/05/14 0040  TSH  9.740*   No results found for this basename: VITAMINB12, FOLATE, FERRITIN, TIBC, IRON, RETICCTPCT,  in the last 72 hours   Other results:  EKG :  NSR with 1st degree AV block.  RBBB   Medications:    Infusions: . sodium chloride 1 mL/kg/hr (02/05/14 0436)  . heparin 1,000 Units/hr (02/04/14 2300)  . nitroGLYCERIN 30 mcg/min (02/05/14 0436)    Scheduled Medications: . aspirin EC  81 mg Oral Daily  . carvedilol  6.25 mg Oral q morning - 10a  . clopidogrel  75 mg Oral Q breakfast  . [START ON 02/06/2014] dorzolamide  1 drop Both Eyes Q breakfast  . heparin  2,000 Units Intravenous Once  . insulin aspart  0-15 Units Subcutaneous TID WC  . insulin aspart  0-5 Units Subcutaneous QHS  . latanoprost  1 drop Both Eyes QHS  . levothyroxine  25 mcg Oral QAC breakfast  . rosuvastatin  10 mg Oral Daily  . sodium chloride  3 mL Intravenous Q12H   . sodium chloride  3 mL Intravenous Q12H    Assessment/ Plan:   Active Problems:   Chest pain   Unstable angina  1. Non-ST segment elevation myocardial infarction: He presents with symptoms consistent with a non-ST segment elevation myocardial infarction. He has positive troponin level. We have restarted his Plavix. He'll be cathed Later today.  Continue heparin. Continue with the same medications.  2. Hyperlipidemia: Continue current dose of Crestor  3. Hypothyroidism: his TSH is elevated. We'll need to increase his Synthroid to 50 mcg and appeared to be followed as an outpatient  4. Diabetes mellitus-followed by his internal medicine doctor.   Disposition:  For cath + possible PCI  today.  He became very agitated following his last cath.     Length of Stay: 1  Thayer Headings, Brooke Bonito., MD, [facility] 02/05/2014, 10:31 AM Office [phone] Pager [phone]

## 2014-02-05 NOTE — Care Management Note (Addendum)
  Page 1 of 1   02/07/2014     12:48:40 PM CARE MANAGEMENT NOTE 02/07/2014  Patient:  [name], [name]   Account Number:  [id number]  Date Initiated:  02/05/2014  Documentation initiated by:  Elissa Hefty  Subjective/Objective Assessment:   adm w angina/ch pain     Action/Plan:   lives w wife, pcp dr Gwyndolyn Saxon hopper   Anticipated DC Date:  02/07/2014   Anticipated DC Plan:  HOME/SELF CARE         Choice offered to / List presented to:             Status of service:  Completed, signed off Medicare Important Message given?  YES (If response is "NO", the following Medicare IM given date fields will be blank) Date Medicare IM given:  02/07/2014 Medicare IM given by:  Sieara Bremer Date Additional Medicare IM given:   Additional Medicare IM given by:    Discharge Disposition:  HOME/SELF CARE  Per UR Regulation:  Reviewed for med. necessity/level of care/duration of stay  If discussed at [place] of Stay Meetings, dates discussed:    Comments:  Corleen Otwell RN, BSN, MSHL, CCM  Nurse - Case Manager,  ([facility] [place])  [phone]  02/07/2014 Case transferred to [place] services IM 02/04/2014 initial provided by admissions. Home / Self care.

## 2014-02-05 NOTE — Progress Notes (Signed)
  Echocardiogram 2D Echocardiogram has been performed.  [name] 02/05/2014, 9:08 AM

## 2014-02-05 NOTE — CV Procedure (Signed)
[name] is a 80 y.o. male    [id number]  [id number] LOCATION:  FACILITY: [place]  PHYSICIAN: Troy Sine, MD, [facility] 09[birth year]   DATE OF PROCEDURE:  02/05/2014    CARDIAC CATHETERIZATION     HISTORY:    [name] is a 80 y.o. male who underwent CABG revascularization surgery in 1996, who is status post BMS to the saphenous vein graft to marginal vessel in 2008.  He subsequently developed a non-ST segment elevation MI in March 2013 was found to have a patent LIMA to LAD, a known occluded vein graft to diagonal, and new occlusion of the vein graft to the marginal with extensive thrombus.  The patient has developed recurrent episodes of increasing chest pain for the past month.  Yesterday, while playing golf, he developed severe substernal chest discomfort.  He has right bundle branch block.  He was admitted yesterday and has ruled in for non-ST segment elevation MI.  He presents now for cardiac catheterization.   PROCEDURE: Left heart catheterization: Coronary angiography into the native coronary arteries, saphenous vein grafts, and left internal mammary artery; left ventriculography.  The patient was brought to the [facility] cardiac catherization labaratory in the fasting state. He  was premedicated with Versed 1 mg and fentanyl 25 mcg. His right groin was prepped and shaved in usual sterile fashion. Xylocaine 1% was used for local anesthesia. A 5 French sheath was inserted into the R femoral artery. Diagnostic catheterizatiion was done with 5 [place] FL4, FR4, LIMA and pigtail catheters. Left ventriculography was done with 28 cc Omnipaque contrast. Hemostasis was obtained by direct manual compression. The patient tolerated the procedure well.   HEMODYNAMICS:   Central Aorta: 135/66   Left Ventricle: 135/20  ANGIOGRAPHY:  Fluoroscopy reveals significant coronary calcification.  Left main: Large-caliber vessel with luminal irregularity without significant stenosis.   It trifurcated into the LAD, an intermediate length vessel, and left circumflex carotid artery.  LAD: Immediately gave rise to a totally occluded diagonal which arose just after the ostium.  Another diagonal vessel immediately after this occluded vessel and had diffuse 70-80% proximal stenosis.  The LAD had 80% stenosis before the first septal perforating artery and small diagonal branch.  The LAD was then occluded after the first septal perforating artery.  There is faint collateralization to the distal RCA territory via the septal perforating artery.  [place] Intermediate: Occluded proximally.  Left circumflex: Occluded proximally.   Right coronary artery: Occluded proximally after giving rise to an SA branch  LIMA to LAD: Widely patent and anastomosed into the mid LAD.  The LAD was free of significant disease after the anastomosis and was smooth in contour until at the very apex.  There was a 95% stenosis in a small caliber apical LAD vessel.  There was collateralization to a diagonal branch from the LAD.  SVG to circumflex marginal vessel was occluded near [place] just proximal to a remotely placed stent in the proximal segment of this vein graft.  SVG to diagonal vessel was occluded at its origin and this was old.   Left ventriculography revealed severe hypokinesis involving the inferior wall with hypocontractility in the distal inferior segment, and severe hypocontractility in the distal anterolateral wall and apex.  There was a cut-off appearance at the apex, raising the possibility of a possible layered thrombus.  The mid to basal anterolateral wall, and fairly good contractility. Ejection fraction is approximately 30-35%.    IMPRESSION:  Ischemic cardiomyopathy with an ejection fraction  of 30-35%, and evidence for severe hypokinesis to akinesis of the inferior wall, hypokinesis of the distal anterolateral wall and apex with a sharp cutoff at the apex, raising the possibility of a  layered thrombus.  Severe multivessel native coronary obstructive disease with total occlusion of the first diagonal branch of the LAD or ostially, 78% stenosis in the very proximal diagonal vessel, with 80% LAD stenosis before the septal perforator artery and total occlusion beyond the septal perforator; total occlusion of the intermediate vessel, total occlusion of the proximal circumflex and RCA.  Patent LIMA graft supplying the mid LAD with evidence for high percent apical LAD stenosis and evidence for collateralization to a diagonal vessel.  Occluded saphenous vein graft immediately proximal to a previously placed proximal stent which had supplied the circumflex marginal vessel.  Occlusion of the saphenous vein graft that supplied the diagonal vessel.  RECOMMENDATION:  Medical therapy.  Consider Definity contrast echo to evaluate for possible apical thrombus.  Troy Sine, MD, [facility] 02/05/2014 1:16 PM

## 2014-02-05 NOTE — Progress Notes (Signed)
ANTICOAGULATION CONSULT NOTE - Follow Up Consult  Pharmacy Consult for heparin Indication: chest pain/ACS  Allergies  Allergen Reactions  . Atorvastatin     REACTION: myalgias  . Folic Acid     REACTION: Face burns  . Nicardipine Hcl     REACTION: Gingival hypertrophy  . Pravastatin     myalgias    Patient Measurements: Height: 5\' 9"  (175.3 cm) Weight: 182 lb 12.2 oz (82.9 kg) IBW/kg (Calculated) : 70.7 Heparin Dosing Weight: 83 kg  Vital Signs: Temp: 98.6 F (37 C) (09/22 0746) Temp src: Oral (09/22 0746) BP: 141/74 mmHg (09/22 1400) Pulse Rate: 73 (09/22 1358)  Labs:  Recent Labs  02/04/14 1935 02/04/14 2008 02/05/14 0040 02/05/14 0740  HGB 15.0  --   --  13.4  HCT 43.1  --   --  40.1  PLT 152  --   --  145*  LABPROT  --  12.6  --   --   INR  --  0.94  --   --   HEPARINUNFRC  --   --   --  <0.10*  CREATININE 1.39*  --   --  1.09  TROPONINI <0.30  --  5.61* 5.73*    Estimated Creatinine Clearance: 49.5 ml/min (by C-G formula based on Cr of 1.09).   Medications:  Scheduled:  . aspirin EC  81 mg Oral Daily  . carvedilol  6.25 mg Oral q morning - 10a  . clopidogrel  75 mg Oral Q breakfast  . [START ON 02/06/2014] dorzolamide  1 drop Both Eyes Q breakfast  . heparin  2,000 Units Intravenous Once  . insulin aspart  0-15 Units Subcutaneous TID WC  . insulin aspart  0-5 Units Subcutaneous QHS  . latanoprost  1 drop Both Eyes QHS  . [START ON 02/06/2014] levothyroxine  50 mcg Oral QAC breakfast  . rosuvastatin  10 mg Oral Daily  . sodium chloride  3 mL Intravenous Q12H   Infusions:  . sodium chloride 90 mL/hr at 02/05/14 1318  . heparin    . nitroGLYCERIN 30 mcg/min (02/05/14 0800)    Assessment: 80 yo m admitted on 9/21 for chest pain. Pt has a history of CAD s/p CABG. Pt also had an MI in 2013 with occlusion of SVG to OM that could not be reperfused. Pt was supposed to have gum surgery in November so plavix and aspirin were stopped a couple of weeks  ago.  Patient is now s/p cath with severe multi-vessel disease with evidence of collateralization.  Pharmacy is consulted to begin heparin 8-hrs post shealth removal.  Shealth was removed at 1330.  Begin heparin at 1300 units/hr (was previously subtherapeutic on 1000 units/hr) at 2130 tonight.  Will check HL with AM labs.  Hgb 13.4, plts 145, no s/s of bleeding.  Goal of Therapy:  Heparin level 0.3-0.7 units/ml Monitor platelets by anticoagulation protocol: Yes   Plan:  Begin heparin infusion of 1300 units/hr at 2130 tonight HL with AM labs Monitor hgb/plts, s/s of bleeding, clinical course  Cassie L. Nicole Kindred, PharmD Clinical Pharmacy Resident Pager: [phone] 02/05/2014 3:07 PM

## 2014-02-05 NOTE — Progress Notes (Signed)
Inpatient Diabetes Program Recommendations  AACE/ADA: New Consensus Statement on Inpatient Glycemic Control (2013)  Target Ranges:  Prepandial:   less than 140 mg/dL      Peak postprandial:   less than 180 mg/dL (1-2 hours)      Critically ill patients:  140 - 180 mg/dL  Results for IREOLUWA, GRANT (MRN [id number]) as of 02/05/2014 11:05  Ref. Range 02/05/2014 00:18 02/05/2014 07:47  Glucose-Capillary Latest Range: 70-99 mg/dL 272 (H) 310 (H)   Inpatient Diabetes Program Recommendations Insulin - Basal: add 24 units Levemir (this is 1/2 home dose) Thank you  Raoul Pitch BSN, RN,CDE Inpatient Diabetes Coordinator [phone] (team pager)

## 2014-02-05 NOTE — Interval H&P Note (Signed)
Cath Lab Visit (complete for each Cath Lab visit)  Clinical Evaluation Leading to the Procedure:   ACS: Yes.    Non-ACS:    Anginal Classification: CCS IV  Anti-ischemic medical therapy: Maximal Therapy (2 or more classes of medications)  Non-Invasive Test Results: No non-invasive testing performed  Prior CABG: Previous CABG      History and Physical Interval Note:  02/05/2014 12:12 PM  [name]  has presented today for surgery, with the diagnosis of chest pain  The various methods of treatment have been discussed with the patient and family. After consideration of risks, benefits and other options for treatment, the patient has consented to  Procedure(s): LEFT HEART CATHETERIZATION WITH CORONARY/GRAFT ANGIOGRAM (N/A) as a surgical intervention .  The patient's history has been reviewed, patient examined, no change in status, stable for surgery.  I have reviewed the patient's chart and labs.  Questions were answered to the patient's satisfaction.     [name] A

## 2014-02-06 DIAGNOSIS — I359 Nonrheumatic aortic valve disorder, unspecified: Secondary | ICD-10-CM

## 2014-02-06 DIAGNOSIS — R079 Chest pain, unspecified: Secondary | ICD-10-CM

## 2014-02-06 LAB — GLUCOSE, CAPILLARY
Glucose-Capillary: 265 mg/dL — ABNORMAL HIGH (ref 70–99)
Glucose-Capillary: 221 mg/dL — ABNORMAL HIGH (ref 70–99)
Glucose-Capillary: 222 mg/dL — ABNORMAL HIGH (ref 70–99)
Glucose-Capillary: 274 mg/dL — ABNORMAL HIGH (ref 70–99)

## 2014-02-06 LAB — CBC
HCT: 39.4 % (ref 39.0–52.0)
Hemoglobin: 13.4 g/dL (ref 13.0–17.0)
MCH: 31.6 pg (ref 26.0–34.0)
MCHC: 34 g/dL (ref 30.0–36.0)
MCV: 92.9 fL (ref 78.0–100.0)
Platelets: 141 10*3/uL — ABNORMAL LOW (ref 150–400)
RBC: 4.24 MIL/uL (ref 4.22–5.81)
RDW: 13.5 % (ref 11.5–15.5)
WBC: 6.5 10*3/uL (ref 4.0–10.5)

## 2014-02-06 LAB — HEPARIN LEVEL (UNFRACTIONATED)
HEPARIN UNFRACTIONATED: 0.53 [IU]/mL (ref 0.30–0.70)
Heparin Unfractionated: 0.53 IU/mL (ref 0.30–0.70)

## 2014-02-06 MED ORDER — ISOSORBIDE MONONITRATE ER 30 MG PO TB24
30.0000 mg | ORAL_TABLET | Freq: Every day | ORAL | Status: DC
  Administered 2014-02-06 – 2014-02-07 (×2): 30 mg via ORAL
  Filled 2014-02-06 (×2): qty 1

## 2014-02-06 MED ORDER — PERFLUTREN LIPID MICROSPHERE
4.0000 mL | INTRAVENOUS | Status: AC | PRN
  Filled 2014-02-06: qty 10

## 2014-02-06 MED ORDER — ROSUVASTATIN CALCIUM 20 MG PO TABS
20.0000 mg | ORAL_TABLET | Freq: Every day | ORAL | Status: DC
  Administered 2014-02-07: 20 mg via ORAL
  Filled 2014-02-06: qty 1

## 2014-02-06 MED ORDER — CARVEDILOL 3.125 MG PO TABS
3.1250 mg | ORAL_TABLET | Freq: Two times a day (BID) | ORAL | Status: DC
  Administered 2014-02-06 – 2014-02-07 (×2): 3.125 mg via ORAL
  Filled 2014-02-06 (×4): qty 1

## 2014-02-06 MED ORDER — BENAZEPRIL HCL 40 MG PO TABS
40.0000 mg | ORAL_TABLET | Freq: Every day | ORAL | Status: DC
  Administered 2014-02-06 – 2014-02-07 (×2): 40 mg via ORAL
  Filled 2014-02-06 (×2): qty 1

## 2014-02-06 MED ORDER — INSULIN DETEMIR 100 UNIT/ML ~~LOC~~ SOLN
24.0000 [IU] | Freq: Every day | SUBCUTANEOUS | Status: DC
  Administered 2014-02-06: 24 [IU] via SUBCUTANEOUS
  Filled 2014-02-06 (×2): qty 0.24

## 2014-02-06 NOTE — Progress Notes (Signed)
RN approached by family, family concerned that pt is not breathing right.  Pt sleeping comfortably, VS wnl.  Pt not c/o any discomfort or is in any distress.  Will continue to monitor.

## 2014-02-06 NOTE — Progress Notes (Addendum)
ANTICOAGULATION CONSULT NOTE - Follow Up Consult  Pharmacy Consult for heparin Indication: chest pain/ACS  Allergies  Allergen Reactions  . Atorvastatin     REACTION: myalgias  . Folic Acid     REACTION: Face burns  . Nicardipine Hcl     REACTION: Gingival hypertrophy  . Pravastatin     myalgias    Patient Measurements: Height: 5\' 9"  (175.3 cm) Weight: 182 lb 12.2 oz (82.9 kg) IBW/kg (Calculated) : 70.7 Heparin Dosing Weight: 83 kg  Vital Signs: Temp: 98.4 F (36.9 C) (09/23 0754) Temp src: Oral (09/23 0754) BP: 160/73 mmHg (09/23 0754) Pulse Rate: 78 (09/23 0754)  Labs:  Recent Labs  02/04/14 1935 02/04/14 2008 02/05/14 0040 02/05/14 0740 02/06/14 0700 02/06/14 0701  HGB 15.0  --   --  13.4  --  13.4  HCT 43.1  --   --  40.1  --  39.4  PLT 152  --   --  145*  --  141*  LABPROT  --  12.6  --   --   --   --   INR  --  0.94  --   --   --   --   HEPARINUNFRC  --   --   --  <0.10* 0.53  --   CREATININE 1.39*  --   --  1.09  --   --   TROPONINI <0.30  --  5.61* 5.73*  --   --     Estimated Creatinine Clearance: 49.5 ml/min (by C-G formula based on Cr of 1.09).   Medications:  Scheduled:  . aspirin EC  81 mg Oral Daily  . benazepril  40 mg Oral Daily  . carvedilol  3.125 mg Oral BID WC  . clopidogrel  75 mg Oral Q breakfast  . dorzolamide  1 drop Both Eyes Q breakfast  . insulin aspart  0-15 Units Subcutaneous TID WC  . insulin aspart  0-5 Units Subcutaneous QHS  . insulin detemir  24 Units Subcutaneous QHS  . isosorbide mononitrate  30 mg Oral Daily  . latanoprost  1 drop Both Eyes QHS  . levothyroxine  50 mcg Oral QAC breakfast  . [START ON 02/07/2014] rosuvastatin  20 mg Oral Daily  . sodium chloride  3 mL Intravenous Q12H   Infusions:  . sodium chloride 50 mL/hr at 02/05/14 1954  . heparin 1,300 Units/hr (02/06/14 0800)    Assessment: 80 yo m admitted on 9/21 for chest pain. Pt has a history of CAD s/p CABG. Pt also had an MI in 2013 with  occlusion of SVG to OM that could not be reperfused. Patient is now s/p cath with severe multi-vessel disease.  Pharmacy was consulted to resume heparin 8-hr post shealth removal.  HL this AM was therapeutic at 0.53 on 1300 units/hr.  Will continue current rate and recheck a 8-hr HL @ 1500. Heparin will be continued until echo results are back (possible apical thrombus). Hgb 13.4, plts 141, no s/s of bleed.   Goal of Therapy:  Heparin level 0.3-0.7 units/ml Monitor platelets by anticoagulation protocol: Yes   Plan:  Continue heparin infusion at 1300 units/hr 8-hr HL @ 1500 Monitor echo results, hgb/plts, s/s of bleeding  Cassie L. Nicole Kindred, PharmD Clinical Pharmacy Resident Pager: [phone] 02/06/2014 11:58 AM   1500 HL was still therapeutic at 0.53 on 1300 units/hr. Continue infusion at 1300 units/hr and check a HL with AM labs. No s/s of bleeding.  Cassie L. Nicole Kindred, PharmD Clinical Pharmacy  Resident Pager: [phone] 02/06/2014 4:24 PM

## 2014-02-06 NOTE — Progress Notes (Signed)
  Echocardiogram 2D Echocardiogram has been performed.  [name] M 02/06/2014, 2:28 PM

## 2014-02-06 NOTE — Progress Notes (Signed)
PROGRESS NOTE  Subjective:   [name] is a 80-year-old gentleman with history of coronary artery disease. He stopped his Plavix several weeks ago for potential gum surgery. He was on the golf [place] yesterday and started having episodes of chest discomfort. He came into the hospital and was found to have positive enzymes consistent with a non-ST segment elevation myocardial infarction. He scheduled for cardiac catheterization today.  He had cath yesterday that shows a patent LIMA to LAD.  His other vessels are essentially occluded  Objective:    Vital Signs:   Temp:  [97.3 F (36.3 C)-98.6 F (37 C)] 98.4 F (36.9 C) (09/23 0754) Pulse Rate:  [56-90] 78 (09/23 0754) Resp:  [9-23] 21 (09/23 0754) BP: (109-162)/(53-84) 160/73 mmHg (09/23 0754) SpO2:  [97 %-100 %] 97 % (09/23 0754)  Last BM Date: 02/04/14   24-hour weight change: Weight change:   Weight trends: Filed Weights   02/04/14 2300  Weight: 182 lb 12.2 oz (82.9 kg)    Intake/Output:  09/22 0701 - 09/23 0700 In: 2198.4 [P.O.:590; I.V.:1608.4] Out: 2050 [Urine:2050] Total I/O In: 52 [I.V.:52] Out: 300 [Urine:300]   Physical Exam: BP 160/73  Pulse 78  Temp(Src) 98.4 F (36.9 C) (Oral)  Resp 21  Ht 5\' 9"  (1.753 m)  Wt 182 lb 12.2 oz (82.9 kg)  BMI 26.98 kg/m2  SpO2 97%  Wt Readings from Last 3 Encounters:  02/04/14 182 lb 12.2 oz (82.9 kg)  02/04/14 182 lb 12.2 oz (82.9 kg)  12/13/13 180 lb 12.8 oz (82.01 kg)    General: Vital signs reviewed and noted.   Head: Normocephalic, atraumatic.  Eyes: conjunctivae/corneas clear.  EOM's intact.   Throat: normal  Neck:  normal   Lungs:    clear   Heart:  Rr, normal S1S2  Abdomen:  Soft, non-tender, non-distended    Extremities: Pulses are ok   Neurologic: A&O X3, CN II - XII are grossly intact.   Psych: Normal     Labs: BMET:  Recent Labs  02/04/14 1935 02/05/14 0740  NA 139 138  K 3.9 4.6  CL 99 102  CO2 23 24  GLUCOSE 376* 327*    BUN 25* 22  CREATININE 1.39* 1.09  CALCIUM 9.3 9.1    Liver function tests: No results found for this basename: AST, ALT, ALKPHOS, BILITOT, PROT, ALBUMIN,  in the last 72 hours No results found for this basename: LIPASE, AMYLASE,  in the last 72 hours  CBC:  Recent Labs  02/04/14 1935 02/05/14 0740 02/06/14 0701  WBC 6.9 6.8 6.5  NEUTROABS 4.1  --   --   HGB 15.0 13.4 13.4  HCT 43.1 40.1 39.4  MCV 91.5 93.7 92.9  PLT 152 145* 141*    Cardiac Enzymes:  Recent Labs  02/04/14 1935 02/05/14 0040 02/05/14 0740  TROPONINI <0.30 5.61* 5.73*    Coagulation Studies:  Recent Labs  02/04/14 2008  LABPROT 12.6  INR 0.94    Other: No components found with this basename: POCBNP,  No results found for this basename: DDIMER,  in the last 72 hours No results found for this basename: HGBA1C,  in the last 72 hours  Recent Labs  02/05/14 0740  CHOL 162  HDL 38*  LDLCALC 89  TRIG 176*  CHOLHDL 4.3    Recent Labs  02/05/14 0040  TSH 9.740*   No results found for this basename: VITAMINB12, FOLATE, FERRITIN, TIBC, IRON, RETICCTPCT,  in the last 72  hours   Other results:  EKG :  NSR with 1st degree AV block.  RBBB   Medications:    Infusions: . sodium chloride 50 mL/hr at 02/05/14 1954  . heparin 1,300 Units/hr (02/06/14 0800)  . nitroGLYCERIN Stopped (02/05/14 2100)    Scheduled Medications: . aspirin EC  81 mg Oral Daily  . carvedilol  6.25 mg Oral q morning - 10a  . clopidogrel  75 mg Oral Q breakfast  . dorzolamide  1 drop Both Eyes Q breakfast  . heparin  2,000 Units Intravenous Once  . insulin aspart  0-15 Units Subcutaneous TID WC  . insulin aspart  0-5 Units Subcutaneous QHS  . latanoprost  1 drop Both Eyes QHS  . levothyroxine  50 mcg Oral QAC breakfast  . rosuvastatin  10 mg Oral Daily  . sodium chloride  3 mL Intravenous Q12H    Assessment/ Plan:   Active Problems:   Chest pain   Unstable angina  1. Non-ST segment elevation  myocardial infarction: He presents with symptoms consistent with a non-ST segment elevation myocardial infarction. He has positive troponin level. We have restarted his Plavix.   He has a patent LIMA - LAD.  His other native vessels and grafts are closed.  One of his grafts appears to be more recent.   Will continue with plavix. He can hold his plavix if needed for procedures.    There is a question of LV thrombus on his LV gram.  Will get an echo with contrast to further define.   2. Hyperlipidemia: Continue current dose of Crestor  3. Hypothyroidism: his TSH is elevated. We'll need to increase his Synthroid to 50 mcg and appeared to be followed as an outpatient  4. Diabetes mellitus-followed by his internal medicine doctor.   Disposition:  Transfer to floor.  Probable DC to home tomorrow.    Length of Stay: 2  Thayer Headings, Brooke Bonito., MD, [facility] 02/06/2014, 11:05 AM Office [phone] Pager [phone]

## 2014-02-07 ENCOUNTER — Other Ambulatory Visit: Payer: Self-pay

## 2014-02-07 ENCOUNTER — Telehealth: Payer: Self-pay | Admitting: Cardiology

## 2014-02-07 DIAGNOSIS — I25810 Atherosclerosis of coronary artery bypass graft(s) without angina pectoris: Secondary | ICD-10-CM

## 2014-02-07 DIAGNOSIS — I214 Non-ST elevation (NSTEMI) myocardial infarction: Secondary | ICD-10-CM

## 2014-02-07 LAB — CBC
HCT: 37.3 % — ABNORMAL LOW (ref 39.0–52.0)
Hemoglobin: 12.5 g/dL — ABNORMAL LOW (ref 13.0–17.0)
MCH: 30.5 pg (ref 26.0–34.0)
MCHC: 33.5 g/dL (ref 30.0–36.0)
MCV: 91 fL (ref 78.0–100.0)
PLATELETS: 134 10*3/uL — AB (ref 150–400)
RBC: 4.1 MIL/uL — AB (ref 4.22–5.81)
RDW: 13.7 % (ref 11.5–15.5)
WBC: 5.8 10*3/uL (ref 4.0–10.5)

## 2014-02-07 LAB — GLUCOSE, CAPILLARY
GLUCOSE-CAPILLARY: 167 mg/dL — AB (ref 70–99)
Glucose-Capillary: 229 mg/dL — ABNORMAL HIGH (ref 70–99)

## 2014-02-07 LAB — HEPARIN LEVEL (UNFRACTIONATED): Heparin Unfractionated: 0.68 IU/mL (ref 0.30–0.70)

## 2014-02-07 MED ORDER — BENAZEPRIL HCL 40 MG PO TABS: 40.0000 mg | ORAL_TABLET | Freq: Every day | ORAL | Status: DC

## 2014-02-07 MED ORDER — CARVEDILOL 3.125 MG PO TABS: 3.1250 mg | ORAL_TABLET | Freq: Every morning | ORAL | Status: DC

## 2014-02-07 MED ORDER — LEVOTHYROXINE SODIUM 50 MCG PO TABS: 50.0000 ug | ORAL_TABLET | Freq: Every day | ORAL | Status: DC

## 2014-02-07 NOTE — Telephone Encounter (Signed)
New message           Pt has questions about change in medications

## 2014-02-07 NOTE — Telephone Encounter (Signed)
The pts wife wanted to know if the pt is to continue to take 25 mcg of Synthroid daily or 50 mcg as advised at hospital d/c from today. She is advised that the pt should increase to 50 mcg daily. Also I faxed in RX's for the pts Carvedilol and Benazepril as this was not sent to the pts pharmacy at his d/c today. The pts wife is aware.

## 2014-02-07 NOTE — Discharge Summary (Signed)
Patient seen and examined and history reviewed. Agree with above findings and plan. See earlier rounding note.  Peter [place]ique, [place] 02/07/2014 3:14 PM

## 2014-02-07 NOTE — Progress Notes (Signed)
All d/c instructions explained and given to pt by Benjie Karvonen, RN.

## 2014-02-07 NOTE — Progress Notes (Signed)
Pt d/c off floor via w/c at 1223 to home.  Escorted by family members.  Karie Kirks, RN

## 2014-02-07 NOTE — Progress Notes (Signed)
Subjective:  No chest pain overnight. He says he has been up without problems.  Objective:  Vital Signs in the last 24 hours: Temp:  [97.8 F (36.6 C)-98.3 F (36.8 C)] 98 F (36.7 C) (09/24 8756) Pulse Rate:  [65-72] 70 (09/24 0712) Resp:  [17-20] 18 (09/24 0712) BP: (113-152)/(57-73) 126/72 mmHg (09/24 0712) SpO2:  [97 %-100 %] 100 % (09/24 0712) Weight:  [180 lb 10.3 oz (81.94 kg)-182 lb 5.1 oz (82.7 kg)] 180 lb 10.3 oz (81.94 kg) (09/24 0712)  Intake/Output from previous day:  Intake/Output Summary (Last 24 hours) at 02/07/14 0759 Last data filed at 02/07/14 0714  Gross per 24 hour  Intake    823 ml  Output    750 ml  Net     73 ml    Physical Exam: General appearance: alert, cooperative and no distress Lungs: clear to auscultation bilaterally Heart: regular rate and rhythm and slow rate Extremities: Rt groin without hematoma   Rate: 45-60  Rhythm: normal sinus rhythm and sinus bradycardia  Lab Results:  Recent Labs  02/06/14 0701 02/07/14 0549  WBC 6.5 5.8  HGB 13.4 12.5*  PLT 141* 134*    Recent Labs  02/04/14 1935 02/05/14 0740  NA 139 138  K 3.9 4.6  CL 99 102  CO2 23 24  GLUCOSE 376* 327*  BUN 25* 22  CREATININE 1.39* 1.09    Recent Labs  02/05/14 0040 02/05/14 0740  TROPONINI 5.61* 5.73*    Recent Labs  02/04/14 2008  INR 0.94    Imaging: Imaging results have been reviewed  Cardiac Studies:  Echo with contrast did not mention LA thrombus EF 55%  Assessment/Plan:  80 yo with history of CAD s/p CABG in 1996 with subsequent PCIs. He was admitted with chest pain 02/04/14 and ruled in for a NSTEMI (Troponin pk- 5.73). He underwent cath 02/06/14 which revealed severe multivessel disease with total occlusion of the Dx1 with an 80% LAD stenosis before the septal perforator and total occlusion beyond the septal perforator. There was total occlusion of the intermediate vessel, the proximal circumflex, and RCA. He had a patent LIMA  graft supplying the mid LAD, an occluded SVG-CFX immediately proximal to a stent which had supplied the CFX, and occlusion of the SVG-Dx. The recommendation was for medical Rx. His EF at cath was 30-35% and there was a question of an apical thrombus.  An echo with contrast was done 02/06/14 which did not mention a thrombus. His EF by echo was 55%. He has chronic bradycardia with RBBB/LAFB but seems to tolerate this well.      Principal Problem:   NSTEMI (non-ST elevated myocardial infarction) Active Problems:   Unstable angina   Type 2 DM   CAD-CABG '96, multiple PCI, cath 02/07/14- med Rx   Bradycardia   Hypertension   Dyslipidemia-statin intol   Hypothyroidism-TSH 9   RBBB with LAFB    PLAN: DC Heparin. Ambulate this am, home later today if stable. He will follow up with Dr Ron Parker. He was on Crestor 10 mg twice a week because of statin intolerance- I doubt he will tolerate Crestor 20 mg daily as currently prescribed. Continue current low dose Coreg. He was on Norvasc prior to admission but not now- consider resuming this. Add Ranexa as an OP if he has more chest pain. He is OK to stop Plavix if needed for gum surgery in Nov.   Kerin Ransom PA-C Beeper [phone] 02/07/2014, 7:59 AM  Patient seen and examined and history reviewed. Agree with above findings and plan. Patient is doing well. No angina with ambulation. Cath site without hematoma. Echo did not show apical thrombus but was limited even with contrast. Will DC home today on current meds.   Kimothy Kishimoto [place], [place] 02/07/2014 10:10 AM

## 2014-02-07 NOTE — Progress Notes (Signed)
Cardiac monitor discontinued and cleaned, CCMD notified. Home discharge instructions discussed with patient and wife. Medication information, follow up appt, diet and activity discussed. Wife understands information by appropriately using teachback. Patient and wife verbally understand.

## 2014-02-07 NOTE — Discharge Instructions (Signed)
Myocardial Infarction A myocardial infarction (MI) is also called a heart attack. It causes damage to the heart that cannot be fixed. An MI often happens when a blood clot or other blockage cuts blood flow to the heart. When this happens, certain areas of the heart begin to die. This is an emergency. HOME CARE  Take medicine as told by your doctor.  Change certain behaviors as told by your doctor. This may include:  Quitting smoking.  Being active.  Keeping a healthy weight.  Eating a heart-healthy diet. Ask your doctor for help with this diet.  Keeping your diabetes under control.  Lessening stress.  Limiting how much alcohol you drink. GET HELP RIGHT AWAY IF:  You have crushing or pressure-like chest pain that spreads to the arms, back, neck, or jaw. Call your local emergency services (911 in [place] Do not drive yourself to the hospital.  You have severe chest pain.  You have shortness of breath during rest, sleep, or with activity.  You have sudden sweating or clammy skin.  You feel sick to your stomach (nauseous) and throw up (vomit).  You suddenly get lightheaded or dizzy.  You feel your heart beating fast or skipping beats. MAKE SURE YOU:   Understand these instructions.  Will watch your condition.  Will get help right away if you are not doing well or get worse. Document Released: 11/02/2011 Document Reviewed: 07/06/2013 [facility] Patient Information 2015 [place]. This information is not intended to replace advice given to you by your health care provider. Make sure you discuss any questions you have with your health care provider. Coronary Angiogram A coronary angiogram, also called coronary angiography, is an X-ray procedure used to look at the arteries in the heart. In this procedure, a dye (contrast dye) is injected through a long, hollow tube (catheter). The catheter is about the size of a piece of cooked spaghetti and is inserted through your groin,  wrist, or arm. The dye is injected into each artery, and X-rays are then taken to show if there is a blockage in the arteries of your heart. LET [facility] CARE PROVIDER KNOW ABOUT:  Any allergies you have, including allergies to shellfish or contrast dye.   All medicines you are taking, including vitamins, herbs, eye drops, creams, and over-the-counter medicines.   Previous problems you or members of your family have had with the use of anesthetics.   Any blood disorders you have.   Previous surgeries you have had.  History of kidney problems or failure.   Other medical conditions you have. RISKS AND COMPLICATIONS  Generally, a coronary angiogram is a safe procedure. However, problems can occur and include:  Allergic reaction to the dye.  Bleeding from the access site or other locations.  Kidney injury, especially in people with impaired kidney function.  Stroke (rare).  Heart attack (rare). BEFORE THE PROCEDURE   Do not eat or drink anything after midnight the night before the procedure or as directed by your health care provider.   Ask your health care provider about changing or stopping your regular medicines. This is especially important if you are taking diabetes medicines or blood thinners. PROCEDURE  You may be given a medicine to help you relax (sedative) before the procedure. This medicine is given through an intravenous (IV) access tube that is inserted into one of your veins.   The area where the catheter will be inserted will be washed and shaved. This is usually done in the groin  but may be done in the fold of your arm (near your elbow) or in the wrist.   A medicine will be given to numb the area where the catheter will be inserted (local anesthetic).   The health care provider will insert the catheter into an artery. The catheter will be guided by using a special type of X-ray (fluoroscopy) of the blood vessel being examined.   A special dye will  then be injected into the catheter, and X-rays will be taken. The dye will help to show where any narrowing or blockages are located in the heart arteries.  AFTER THE PROCEDURE   If the procedure is done through the leg, you will be kept in bed lying flat for several hours. You will be instructed to not bend or cross your legs.  The insertion site will be checked frequently.   The pulse in your feet or wrist will be checked frequently.   Additional blood tests, X-rays, and an electrocardiogram may be done.  Document Released: 11/07/2002 Document Revised: 09/17/2013 Document Reviewed: 09/25/2012 [facility] Patient Information 2015 [place], [place]. This information is not intended to replace advice given to you by your health care provider. Make sure you discuss any questions you have with your health care provider.

## 2014-02-07 NOTE — Discharge Summary (Signed)
Patient ID: [name],  MRN: [id number], DOB/AGE: 07/25/1925 80 y.o.  Admit date: 02/04/2014 Discharge date: 02/07/2014  Primary Care Provider: Unice Cobble, MD Primary Cardiologist: Dr Ron Parker  Discharge Diagnoses Principal Problem:   NSTEMI (non-ST elevated myocardial infarction) Active Problems:   Unstable angina   Type 2 DM   CAD-CABG '96, multiple PCI, cath 02/07/14- med Rx   Bradycardia   Hypertension   Dyslipidemia   Hypothyroidism-TSH 9   RBBB with LAFB    Procedures: Coronary angiogram 02/06/14     Hospital Course:  80 yo with history of CAD s/p CABG in 1996 with subsequent PCIs. He was admitted with chest pain 02/04/14 and ruled in for a NSTEMI (Troponin pk- 5.73). He underwent cath 02/06/14 which revealed severe multivessel disease with total occlusion of the Dx1, an 80% LAD stenosis before the septal perforator, and total occlusion beyond the septal perforator. There was total occlusion of the intermediate vessel, the proximal circumflex, and RCA. He had a patent LIMA to LAD, an occluded SVG-CFX immediately proximal to a stent which had supplied the CFX, and occlusion of the SVG-Dx. The recommendation was for medical Rx. His EF at cath was 30-35% and there was a question of an apical thrombus. An echo with contrast was done 02/06/14 which did not mention a thrombus. His EF by echo was 55%. He has chronic bradycardia with RBBB/LAFB but seems to tolerate this well. Dr Martinique felt he could be discharged home 02/07/14. He will follow up with Dr Ron Parker. He may require gum surgery in the near future and we feel he can hold his Plavix for this if needed.    Discharge Vitals:  Blood pressure 106/53, pulse 64, temperature 98 F (36.7 C), temperature source Oral, resp. rate 18, height 5\' 9"  (1.753 m), weight 180 lb 10.3 oz (81.94 kg), SpO2 100.00%.    Labs: Results for orders placed during the hospital encounter of 02/04/14 (from the past 24 hour(s))  GLUCOSE, CAPILLARY      Status: Abnormal   Collection Time    02/06/14 12:30 PM      Result Value Ref Range   Glucose-Capillary 221 (*) 70 - 99 mg/dL  HEPARIN LEVEL (UNFRACTIONATED)     Status: None   Collection Time    02/06/14  3:10 PM      Result Value Ref Range   Heparin Unfractionated 0.53  0.30 - 0.70 IU/mL  GLUCOSE, CAPILLARY     Status: Abnormal   Collection Time    02/06/14  4:35 PM      Result Value Ref Range   Glucose-Capillary 222 (*) 70 - 99 mg/dL  GLUCOSE, CAPILLARY     Status: Abnormal   Collection Time    02/06/14 10:44 PM      Result Value Ref Range   Glucose-Capillary 274 (*) 70 - 99 mg/dL   Comment 1 Notify RN    CBC     Status: Abnormal   Collection Time    02/07/14  5:49 AM      Result Value Ref Range   WBC 5.8  4.0 - 10.5 K/uL   RBC 4.10 (*) 4.22 - 5.81 MIL/uL   Hemoglobin 12.5 (*) 13.0 - 17.0 g/dL   HCT 37.3 (*) 39.0 - 52.0 %   MCV 91.0  78.0 - 100.0 fL   MCH 30.5  26.0 - 34.0 pg   MCHC 33.5  30.0 - 36.0 g/dL   RDW 13.7  11.5 - 15.5 %  Platelets 134 (*) 150 - 400 K/uL  HEPARIN LEVEL (UNFRACTIONATED)     Status: None   Collection Time    02/07/14  5:49 AM      Result Value Ref Range   Heparin Unfractionated 0.68  0.30 - 0.70 IU/mL  GLUCOSE, CAPILLARY     Status: Abnormal   Collection Time    02/07/14  6:05 AM      Result Value Ref Range   Glucose-Capillary 167 (*) 70 - 99 mg/dL    Disposition:  Follow-up Information   Follow up with Dola Argyle, MD. (office will call you)    Specialty:  Cardiology   Contact information:   [address] [place] [address] [phone]       Discharge Medications:    Medication List         acetaminophen 325 MG tablet  Commonly known as:  TYLENOL  Take 650 mg by mouth every 6 (six) hours as needed for mild pain or moderate pain.     amLODipine 10 MG tablet  Commonly known as:  NORVASC  Take 10 mg by mouth daily.     aspirin EC 81 MG tablet  Take 81 mg by mouth every morning.     benazepril 40  MG tablet  Commonly known as:  LOTENSIN  Take 40 mg by mouth daily.     carvedilol 3.125 MG tablet  Commonly known as:  COREG  Take 3.125 mg by mouth every morning.     chlorhexidine 0.12 % solution  Commonly known as:  PERIDEX  Use as directed 15 mLs in the mouth or throat 2 (two) times daily.     cholecalciferol 1000 UNITS tablet  Commonly known as:  VITAMIN D  Take 2,000 Units by mouth daily.     clopidogrel 75 MG tablet  Commonly known as:  PLAVIX  Take 75 mg by mouth daily.     dorzolamide 2 % ophthalmic solution  Commonly known as:  TRUSOPT  1 drop both eyes in the evening     furosemide 40 MG tablet  Commonly known as:  LASIX  Take 20 mg by mouth daily as needed for fluid.     insulin detemir 100 UNIT/ML injection  Commonly known as:  LEVEMIR  Inject 48 Units into the skin at bedtime.     isosorbide mononitrate 30 MG 24 hr tablet  Commonly known as:  IMDUR  Take 30 mg by mouth daily.     levothyroxine 50 MCG tablet  Commonly known as:  SYNTHROID, LEVOTHROID  Take 1 tablet (50 mcg total) by mouth daily before breakfast.     LUMIGAN 0.03 % ophthalmic solution  Generic drug:  bimatoprost  Place into both eyes at bedtime and may repeat dose one time if needed.     nitroGLYCERIN 0.4 MG SL tablet  Commonly known as:  NITROSTAT  Place 0.4 mg under the tongue every 5 (five) minutes as needed for chest pain.     nitroGLYCERIN 0.4 MG SL tablet  Commonly known as:  NITROSTAT  Place 1 tablet (0.4 mg total) under the tongue every 5 (five) minutes x 3 doses as needed for chest pain (up to 3 doses).     NONFORMULARY OR COMPOUNDED ITEM  - Diabetic testing supply  - Check glucose once daily if possible  - Fasting or morning glucose recommended M, W, F, & Sun if possible Goal= 100-150  - Glucose 2 hours after breakfast Tue, after lunch  Th & 2 hrs after eve meal Sat if possible Goal = < 180     rosuvastatin 10 MG tablet  Commonly known as:  CRESTOR  Take 10 mg by  mouth 2 (two) times a week. Takes on Sundays and Wednesdays         Duration of Discharge Encounter: Greater than 30 minutes including physician time.  Angelena Form PA-C 02/07/2014 10:30 AM

## 2014-02-07 NOTE — Progress Notes (Signed)
Patient taken out of hospital for discharge via wheelchair.  

## 2014-02-07 NOTE — Progress Notes (Signed)
CARDIAC REHAB PHASE I   PRE:  Rate/Rhythm: 55 SB    BP: sitting 150/69    SaO2: 100 RA  MODE:  Ambulation: 550  ft   POST:  Rate/Rhythm: 77 SR    BP: sitting 151/62     SaO2: 100 RA  Pt seems tired but able to get up and walk without much assist. Denied CP or SOB (although slight SOB noted). To recliner. Discussed MI, HF, low sodium, daily walking, and NTG. Pt not interested in CRPII again. Left materials and encouraged pt to weigh daily to watch out for HF.  [phone]  Darrick Meigs CES, ACSM 02/07/2014 9:35 AM

## 2014-02-08 ENCOUNTER — Telehealth: Payer: Self-pay | Admitting: *Deleted

## 2014-02-08 NOTE — Telephone Encounter (Signed)
Transition Care Management Follow-up Telephone Call D/C date 02/07/14  How have you been since you were released from the hospital? Pt/wife stated he was doing good   Do you understand why you were in the hospital? YES, they both understood why he was admitted   Do you understand the discharge instrcutions? YES, wife states the nurse went over with her, and we went over together again  Items Reviewed:  Medications reviewed: YES, reviewed meds with wife she stated they had gave him some new meds benazapril, carvedilol, isosorbide, and increase his thyroid medication. Adjusting well  Allergies reviewed: YES, reviewed  Dietary changes reviewed: YES, wife states he is eating well. Just ate a good breakfast  Referrals reviewed: No referral was needed   Functional Questionnaire:  Activities of Daily Living (ADLs):   Wife stateshe is independent in the following: bathing and hygiene, feeding he is able to do. 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 NO   Confirmed importance and date/time of follow-up visits scheduled: YES, made TCM appt for 02/12/14 with Dr. Linna Darner, and advise them to keep appt   Confirmed with patient if condition begins to worsen call PCP or go to the ER.  Patient was given the Call-a-Nurse line [phone]: YES

## 2014-02-12 ENCOUNTER — Ambulatory Visit (INDEPENDENT_AMBULATORY_CARE_PROVIDER_SITE_OTHER): Payer: Medicare Other | Admitting: Internal Medicine

## 2014-02-12 ENCOUNTER — Other Ambulatory Visit (INDEPENDENT_AMBULATORY_CARE_PROVIDER_SITE_OTHER): Payer: Medicare Other

## 2014-02-12 ENCOUNTER — Encounter: Payer: Self-pay | Admitting: Internal Medicine

## 2014-02-12 VITALS — BP 98/60 | HR 61 | Temp 98.2°F | Resp 14 | Wt 183.5 lb

## 2014-02-12 DIAGNOSIS — I10 Essential (primary) hypertension: Secondary | ICD-10-CM

## 2014-02-12 DIAGNOSIS — E1159 Type 2 diabetes mellitus with other circulatory complications: Secondary | ICD-10-CM | POA: Diagnosis not present

## 2014-02-12 DIAGNOSIS — I214 Non-ST elevation (NSTEMI) myocardial infarction: Secondary | ICD-10-CM

## 2014-02-12 DIAGNOSIS — Z23 Encounter for immunization: Secondary | ICD-10-CM

## 2014-02-12 LAB — MICROALBUMIN / CREATININE URINE RATIO
CREATININE, U: 201.4 mg/dL
Microalb Creat Ratio: 1.3 mg/g (ref 0.0–30.0)
Microalb, Ur: 2.6 mg/dL — ABNORMAL HIGH (ref 0.0–1.9)

## 2014-02-12 LAB — HEMOGLOBIN A1C: HEMOGLOBIN A1C: 9 % — AB (ref 4.6–6.5)

## 2014-02-12 NOTE — Assessment & Plan Note (Signed)
A1c , urine microalbumin

## 2014-02-12 NOTE — Assessment & Plan Note (Signed)
Marked discrepancy in BP reports; new BP cuff needed ;to take cuff & records to all OV

## 2014-02-12 NOTE — Progress Notes (Signed)
   Subjective:    Patient ID: [name], male    DOB: 11/16/1925, 80 y.o.   MRN: [id number]  HPI     His 9/21-9/24/15 hospitalization for NSTEMI was reviewed. Cath 9/23 revealed severe multiple vessel involvement. He had total occlusion of Dx1, 80% LAD  total occlusion beyond the septal perforator, occlusion of the proximal circumflex and the right coronary artery. Ejection fraction was estimated as 30-35% with a possible apical thrombus. Echocardiogram revealed an ejection fraction of 55% and no apical thrombus  He is now on Crestor 10 mg 2 times a week to treat the extensive cardiovascular disease medically as it cannot be treated surgically or with stents  Blood pressure at homevaries from 128/68-160/78.  Glucoses range 140-160. His last A1c was 9.4% on 06/28/13.  He is on 48 units of insulin at bedtime. He denies any hypoglycemic spells  He does have slight polyuria.   Review of Systems  All below NEGATIVE: Chest pain, palpitations       Dyspnea Edema Claudication  Lightheadedness,Syncope Weight change Polyphagia/dipsia    Blurred vision /diplopia/lossof vision Limb numbness/tingling/burning Non healing skin lesions Abd pain, bowel changes   Myalgias      Objective:   Physical Exam Positive or pertinent findings include: Affect is flat with almost masked facies. He has gum hyperplasia with erythematous changes of the right anterior mandibular tissues. He has multiple missing teeth. Heart sounds are distant with slow irregular rhythm. He has fusiform changes of the PIP joints There is increased tone and rigidity suggested in the extremities.   General appearance :adequately nourished; in no distress. Eyes: No conjunctival inflammation or scleral icterus is present. Oral exam:  Lips are healthy appearing.There is no oropharyngeal erythema or exudate noted.  Heart:  No gallop, murmur, click, rub or other extra sounds   Lungs:Chest clear to auscultation; no  wheezes, rhonchi,rales ,or rubs present.No increased work of breathing.  Abdomen: bowel sounds normal, soft and non-tender without masses, organomegaly or hernias noted.  No guarding or rebound. No flank tenderness to percussion. Vascular : all pulses equal ; no bruits present. Skin:Warm & dry.  Intact without suspicious lesions or rashes ; no jaundice or tenting Lymphatic: No lymphadenopathy is noted about the head, neck, axilla          Assessment & Plan:  See Current Assessment & Plan in Problem List under specific Diagnosis Exam suggests depression or possibly eatly Parkinson's.

## 2014-02-12 NOTE — Patient Instructions (Signed)
Minimal Blood Pressure Goal= AVERAGE < 140/90;  Ideal is an AVERAGE < 135/85. This AVERAGE should be calculated from @ least 5-7 BP readings taken @ different times of day on different days of week. You should not respond to isolated BP readings , but rather the AVERAGE for that week .Please bring your  blood pressure cuff to office visits to verify that it is reliable.It  can also be checked against the blood pressure device at the pharmacy. Finger or wrist cuffs are not dependable; an arm cuff is. Your next office appointment will be determined based upon review of your pending labs . Those instructions will be transmitted to you by mail. Followup as needed for your acute issue. Please report any significant change in your symptoms.

## 2014-02-12 NOTE — Progress Notes (Signed)
Pre visit review using our clinic review tool, if applicable. No additional management support is needed unless otherwise documented below in the visit note. 

## 2014-02-13 ENCOUNTER — Ambulatory Visit (INDEPENDENT_AMBULATORY_CARE_PROVIDER_SITE_OTHER): Payer: Medicare Other | Admitting: Cardiology

## 2014-02-13 ENCOUNTER — Encounter: Payer: Self-pay | Admitting: Cardiology

## 2014-02-13 VITALS — BP 112/58 | HR 74 | Ht 69.0 in | Wt 182.1 lb

## 2014-02-13 DIAGNOSIS — Z951 Presence of aortocoronary bypass graft: Secondary | ICD-10-CM

## 2014-02-13 DIAGNOSIS — K069 Disorder of gingiva and edentulous alveolar ridge, unspecified: Secondary | ICD-10-CM | POA: Diagnosis not present

## 2014-02-13 DIAGNOSIS — I200 Unstable angina: Secondary | ICD-10-CM

## 2014-02-13 DIAGNOSIS — I25810 Atherosclerosis of coronary artery bypass graft(s) without angina pectoris: Secondary | ICD-10-CM

## 2014-02-13 DIAGNOSIS — R9430 Abnormal result of cardiovascular function study, unspecified: Secondary | ICD-10-CM

## 2014-02-13 DIAGNOSIS — I5022 Chronic systolic (congestive) heart failure: Secondary | ICD-10-CM

## 2014-02-13 DIAGNOSIS — K056 Periodontal disease, unspecified: Secondary | ICD-10-CM | POA: Insufficient documentation

## 2014-02-13 DIAGNOSIS — E038 Other specified hypothyroidism: Secondary | ICD-10-CM

## 2014-02-13 DIAGNOSIS — R0989 Other specified symptoms and signs involving the circulatory and respiratory systems: Secondary | ICD-10-CM

## 2014-02-13 DIAGNOSIS — I25110 Atherosclerotic heart disease of native coronary artery with unstable angina pectoris: Secondary | ICD-10-CM | POA: Insufficient documentation

## 2014-02-13 DIAGNOSIS — I10 Essential (primary) hypertension: Secondary | ICD-10-CM

## 2014-02-13 DIAGNOSIS — I351 Nonrheumatic aortic (valve) insufficiency: Secondary | ICD-10-CM

## 2014-02-13 DIAGNOSIS — I359 Nonrheumatic aortic valve disorder, unspecified: Secondary | ICD-10-CM

## 2014-02-13 DIAGNOSIS — I509 Heart failure, unspecified: Secondary | ICD-10-CM

## 2014-02-13 NOTE — Assessment & Plan Note (Signed)
His TSH was recently up to 9.7. Synthroid was increased from 25 mcg to 50 mcg recently in the hospital and September, 2015.

## 2014-02-13 NOTE — Assessment & Plan Note (Signed)
The patient needs periodontal surgery. I've instructed his wife to give him the last dose of Plavix on November 1 for surgery on November 6. I'm hopeful he can receive the medication after his surgery until it is stopped again on November 15 for surgery on November 20. Plavix should then be resumed after that. The Plavix can be held for his gum surgery because he did not receive a new stent recently.  As part of today's evaluation I spent greater than 25 minutes with the patient's total care. More than half of this time was with direct contact with the patient and his wife reviewing his recent hospitalization and making plans for his upcoming, surgery.

## 2014-02-13 NOTE — Assessment & Plan Note (Signed)
Blood pressure is controlled. No change in therapy. 

## 2014-02-13 NOTE — Progress Notes (Signed)
Patient ID: [name], male   DOB: Jan 04, 1926, 80 y.o.   MRN: [id number]    HPI  Patient is seen today for followup coronary disease. I had seen him last December 13, 2013. He was admitted to the hospital with chest pain on February 04, 2014. He had a troponin rise to 5.7. Catheterization revealed severe multivessel disease. [place]  to the LAD was patent.  There were no areas of stenosis requiring intervention. He has been stable since then. He's not having any recurrent chest pain.  Allergies  Allergen Reactions  . Atorvastatin     REACTION: myalgias  . Folic Acid     REACTION: Face burns  . Nicardipine Hcl     REACTION: Gingival hypertrophy  . Pravastatin     myalgias    Current Outpatient Prescriptions  Medication Sig Dispense Refill  . acetaminophen (TYLENOL) 325 MG tablet Take 650 mg by mouth every 6 (six) hours as needed for mild pain or moderate pain.       Marland Kitchen amLODipine (NORVASC) 10 MG tablet Take 10 mg by mouth daily.      Marland Kitchen aspirin EC 81 MG tablet Take 81 mg by mouth every morning.      . benazepril (LOTENSIN) 40 MG tablet Take 1 tablet (40 mg total) by mouth daily.  30 tablet  3  . carvedilol (COREG) 3.125 MG tablet Take 1 tablet (3.125 mg total) by mouth every morning.  30 tablet  3  . chlorhexidine (PERIDEX) 0.12 % solution Use as directed 15 mLs in the mouth or throat 2 (two) times daily.       . cholecalciferol (VITAMIN D) 1000 UNITS tablet Take 2,000 Units by mouth daily.      . clopidogrel (PLAVIX) 75 MG tablet Take 75 mg by mouth daily.      . dorzolamide (TRUSOPT) 2 % ophthalmic solution 1 drop both eyes in the evening      . furosemide (LASIX) 40 MG tablet Take 20 mg by mouth daily as needed for fluid.      Marland Kitchen insulin detemir (LEVEMIR) 100 UNIT/ML injection Inject 48 Units into the skin at bedtime.      . isosorbide mononitrate (IMDUR) 30 MG 24 hr tablet Take 30 mg by mouth daily.      Marland Kitchen levothyroxine (SYNTHROID, LEVOTHROID) 50 MCG tablet Take 1 tablet (50 mcg  total) by mouth daily before breakfast.  30 tablet  11  . LUMIGAN 0.03 % ophthalmic solution Place into both eyes at bedtime and may repeat dose one time if needed.       . nitroGLYCERIN (NITROSTAT) 0.4 MG SL tablet Place 0.4 mg under the tongue every 5 (five) minutes as needed for chest pain.      . NONFORMULARY OR COMPOUNDED ITEM Diabetic testing supply Check glucose once daily if possible Fasting or morning glucose recommended M, W, F, & Sun if possible Goal= 100-150 Glucose 2 hours after breakfast Tue, after lunch Th & 2 hrs after eve meal Sat if possible Goal = < 180  1 each  0  . rosuvastatin (CRESTOR) 10 MG tablet Take 10 mg by mouth 2 (two) times a week. Takes on Sundays and Wednesdays      . [DISCONTINUED] pravastatin (PRAVACHOL) 20 MG tablet Take 20 mg by mouth daily.         No current facility-administered medications for this visit.    History   Social History  . Marital Status: Married    Spouse  Name: N/A    Number of Children: N/A  . Years of Education: N/A   Occupational History  . Not on file.   Social History Main Topics  . Smoking status: Former Smoker -- 1.00 packs/day for 5 years    Quit date: 05/17/1949  . Smokeless tobacco: Never Used     Comment: Quit at age 19  . Alcohol Use: No  . Drug Use: No  . Sexual Activity: Not on file   Other Topics Concern  . Not on file   Social History Narrative  . No narrative on file    Family History  Problem Relation Age of Onset  . Diabetes Sister   . Heart attack Father 22  . Lung cancer Sister   . Stroke Neg Hx   . Diabetes Brother     Past Medical History  Diagnosis Date  . CAD (coronary artery disease)     Bare-metal stent to SVG circumflex 2008 / nuclear March, 2012, scar distal anterior, anterolateral and apical, no ischemia  . Hypertension   . Ejection fraction     45%, echo, March, 2012,   /  Ejection fraction after his MI March, 2013 is 45%. There is posterior basal akinesis with abnormal septal  motion.  . Aortic insufficiency     Moderate, echo, March, 2012  . Mitral regurgitation     Moderate, echo, March, 2012  . Pulmonary hypertension     Echo, March, 2012, 42 mmHg  . Hx of CABG   . Diabetes mellitus   . Dyslipidemia   . Hypothyroidism   . Bradycardia     Sinus bradycardia  . RBBB (right bundle branch block with left anterior fascicular block)     New July, 2010  . Leg cramps     Nighttime  . Shingles     Severe pain to right flank treated  . Ischemia     .Marland KitchenMarland KitchenEF 42%  . Hyperlipidemia   . Dizziness      12/2009 &12/ 2012  . Abnormal LFTs     LFTs were abnormal with acute MI March, 2013, improving at the time of discharge  . Myocardial infarction 3/19-27/2013    ARF, elevated LFTs  . Systolic CHF   . Elevated CPK     2013, now in 2014 patient on very low dose Crestor    Past Surgical History  Procedure Laterality Date  . Coronary artery bypass graft  1996    5 vessels  . Back surgery  1999    LS sx- 1999  . Spinal fusion  2000    scar tissue, plate insertion/fusion @ [facility] - 2000  . Cardiac surgery    . Cardiac catheterization  2013    occluded graft; Dr Lia Foyer    Patient Active Problem List   Diagnosis Date Noted  . NSTEMI (non-ST elevated myocardial infarction) 02/07/2014  . Chest pain 02/04/2014  . Unstable angina 02/04/2014  . Statin intolerance 07/20/2013  . Chronic systolic CHF (congestive heart failure) 08/31/2012  . Elevated CPK   . Abnormal LFTs   . Ejection fraction   . Hypokalemia 08/10/2011  . Renal insufficiency 08/03/2011  . CAD-CABG '96, multiple PCI, cath 02/07/14- med Rx   . Hypertension   . Aortic insufficiency   . Mitral regurgitation   . Pulmonary hypertension   . Hx of CABG   . Dyslipidemia   . Hypothyroidism-TSH 9   . Bradycardia   . RBBB with LAFB   . Leg  cramps   . Shingles   . Dizziness   . POSTHERPETIC NEURALGIA 04/29/2009  . VITAMIN D DEFICIENCY 02/17/2009  . ARTHRALGIA 02/12/2009  . UNSPECIFIED MYALGIA  AND MYOSITIS 02/12/2009  . SHORTNESS OF BREATH 11/28/2008  . THROMBOCYTOPENIA 08/12/2008  . ADVERSE DRUG REACTION 07/16/2008  . Type II or unspecified type diabetes mellitus with peripheral circulatory disorders, uncontrolled(250.72) 01/16/2008  . GLAUCOMA, BORDERLINE 10/16/2007    ROS   Patient denies fever, chills, headache, sweats, rash, change in vision, change in hearing, chest pain, cough, nausea vomiting, urinary symptoms. All other systems are reviewed and are negative.  PHYSICAL EXAM  Patient's here with his wife as always. Head is atraumatic. There is no jugulovenous distention. Lungs are clear. Respiratory effort is nonlabored. Cardiac exam reveals S1 and S2. The abdomen is soft. There is no peripheral edema.  Filed Vitals:   02/13/14 1507  BP: 112/58  Pulse: 74  Height: 5\' 9"  (1.753 m)  Weight: 182 lb 1.9 oz (82.609 kg)  SpO2: 98%     ASSESSMENT & PLAN

## 2014-02-13 NOTE — Assessment & Plan Note (Signed)
He recently had a non-STEMI. I'm not sure from the cath report if we know the culprit. There was no residual vessel with severe stenosis and no PCI was done.

## 2014-02-13 NOTE — Assessment & Plan Note (Signed)
Mild valvular heart disease. No further workup.

## 2014-02-13 NOTE — Assessment & Plan Note (Signed)
His volume status today is stable. No change in therapy. His wife adjust his diuretic if needed

## 2014-02-13 NOTE — Patient Instructions (Signed)
Your physician recommends that you continue on your current medications as directed. Please refer to the Current Medication list given to you today.  Your physician recommends that you schedule a follow-up appointment in: 3 months  

## 2014-02-14 ENCOUNTER — Other Ambulatory Visit: Payer: Self-pay | Admitting: Internal Medicine

## 2014-02-14 DIAGNOSIS — E1151 Type 2 diabetes mellitus with diabetic peripheral angiopathy without gangrene: Secondary | ICD-10-CM

## 2014-02-14 DIAGNOSIS — E1165 Type 2 diabetes mellitus with hyperglycemia: Principal | ICD-10-CM

## 2014-02-14 DIAGNOSIS — IMO0002 Reserved for concepts with insufficient information to code with codable children: Secondary | ICD-10-CM

## 2014-02-25 ENCOUNTER — Other Ambulatory Visit: Payer: Self-pay

## 2014-02-25 MED ORDER — INSULIN DETEMIR 100 UNIT/ML ~~LOC~~ SOLN: 38.0000 [IU] | Freq: Every day | SUBCUTANEOUS | Status: DC

## 2014-02-26 MED FILL — Perflutren Lipid Microsphere IV Susp 1.1 MG/ML: INTRAVENOUS | Qty: 10 | Status: AC

## 2014-02-28 ENCOUNTER — Encounter: Payer: Self-pay | Admitting: Endocrinology

## 2014-02-28 ENCOUNTER — Ambulatory Visit (INDEPENDENT_AMBULATORY_CARE_PROVIDER_SITE_OTHER): Payer: Medicare Other | Admitting: Endocrinology

## 2014-02-28 VITALS — BP 128/70 | HR 60 | Temp 98.0°F | Resp 16 | Ht 69.0 in | Wt 186.0 lb

## 2014-02-28 DIAGNOSIS — E039 Hypothyroidism, unspecified: Secondary | ICD-10-CM

## 2014-02-28 DIAGNOSIS — E785 Hyperlipidemia, unspecified: Secondary | ICD-10-CM | POA: Diagnosis not present

## 2014-02-28 DIAGNOSIS — R0989 Other specified symptoms and signs involving the circulatory and respiratory systems: Secondary | ICD-10-CM | POA: Diagnosis not present

## 2014-02-28 DIAGNOSIS — IMO0002 Reserved for concepts with insufficient information to code with codable children: Secondary | ICD-10-CM

## 2014-02-28 DIAGNOSIS — I200 Unstable angina: Secondary | ICD-10-CM

## 2014-02-28 DIAGNOSIS — E1165 Type 2 diabetes mellitus with hyperglycemia: Secondary | ICD-10-CM | POA: Diagnosis not present

## 2014-02-28 MED ORDER — INSULIN LISPRO 100 UNIT/ML (KWIKPEN): PEN_INJECTOR | SUBCUTANEOUS | Status: DC

## 2014-02-28 NOTE — Patient Instructions (Signed)
Humalog 5 units before dinner AND IF SUGAR AT 6-7 PM IS OVER 200 then go to 7 units at least  LEVEMIR 45 UNITS DAILY  Please check blood sugars at least half the time about 2 hours after any meal and 3 times per week on waking up.  Please bring blood sugar monitor to each visit

## 2014-02-28 NOTE — Progress Notes (Signed)
Patient ID: [name], male   DOB: May 24, 1925, 80 y.o.   MRN: [id number]            Reason for Appointment: Consultation for Type 2 Diabetes  Referring physician: Unice Cobble M.D.  History of Present Illness:          Diagnosis: Type 2 diabetes mellitus, date of diagnosis: 2011       Past history: He probably was diagnosed to have diabetes on routine lab testing At that time he was started on metformin and this was continued until about a year ago He had fair control of his diabetes initially but most of his A1c readings have been over 7% His A1c increased significantly in 2014 to 9.9% He was probably started on Levemir insulin in 6/14 and metformin discontinued He was told to increase his dose progressively to get his fasting blood sugars down and for about 3 months has been taking  48 units  Recent history:  Because of persistently high A1c of 9% or more he is referred here for further management He is taking Levemir at bedtime quite consistently and has not changed the dose recently from his 48 units regimen His glucose was noticed to be over 200 frequently when he was hospitalized in 9/13 However he has checked his blood sugars in the mornings only with an unknown monitor and these are generally near normal No overnight hypoglycemia       Oral hypoglycemic drugs the patient is taking are:   none     Side effects from medications have been: None INSULIN regimen is described as: 48 Levemir hs  Compliance with the medical regimen: Good  Hypoglycemia:   never  Glucose monitoring:  done one time a day         Glucometer: Unknown     Blood Glucose readings from home record:  PREMEAL Breakfast Lunch Dinner Bedtime  Overall   Glucose range: 69-159      Median:        Self-care: The diet that the patient has been following is: tries to limit fat intake and sodium.     Meals: 2-3 meals per day. Dinner 4 pm, will have 1/2 sandwich at bedtime. Breakfast is an egg and toast  usually, may not have lunch         Exercise: Golf off and on           Dietician visit, most recent: Years ago.               Weight history: Wt Readings from Last 3 Encounters:  02/28/14 186 lb (84.369 kg)  02/13/14 182 lb 1.9 oz (82.609 kg)  02/12/14 183 lb 8 oz (83.235 kg)    Glycemic control:   Lab Results  Component Value Date   HGBA1C 9.0* 02/12/2014   HGBA1C 9.4* 06/28/2013   HGBA1C 9.7* 11/14/2012   Lab Results  Component Value Date   MICROALBUR 2.6* 02/12/2014   LDLCALC 89 02/05/2014   CREATININE 1.09 02/05/2014         Medication List       This list is accurate as of: 02/28/14 11:49 AM.  Always use your most recent med list.               acetaminophen 325 MG tablet  Commonly known as:  TYLENOL  Take 650 mg by mouth every 6 (six) hours as needed for mild pain or moderate pain.     amLODipine 10 MG tablet  Commonly known as:  NORVASC  Take 10 mg by mouth daily.     aspirin EC 81 MG tablet  Take 81 mg by mouth every morning.     benazepril 40 MG tablet  Commonly known as:  LOTENSIN  Take 1 tablet (40 mg total) by mouth daily.     carvedilol 3.125 MG tablet  Commonly known as:  COREG  Take 1 tablet (3.125 mg total) by mouth every morning.     chlorhexidine 0.12 % solution  Commonly known as:  PERIDEX  Use as directed 15 mLs in the mouth or throat 2 (two) times daily.     cholecalciferol 1000 UNITS tablet  Commonly known as:  VITAMIN D  Take 2,000 Units by mouth daily.     clopidogrel 75 MG tablet  Commonly known as:  PLAVIX  Take 75 mg by mouth daily.     dorzolamide 2 % ophthalmic solution  Commonly known as:  TRUSOPT  1 drop both eyes in the evening     furosemide 40 MG tablet  Commonly known as:  LASIX  Take 20 mg by mouth daily as needed for fluid.     glimepiride 2 MG tablet  Commonly known as:  AMARYL  Take 2 mg by mouth daily with breakfast.     insulin detemir 100 UNIT/ML injection  Commonly known as:  LEVEMIR  Inject 48  Units into the skin at bedtime.     isosorbide mononitrate 30 MG 24 hr tablet  Commonly known as:  IMDUR  Take 30 mg by mouth daily.     levothyroxine 50 MCG tablet  Commonly known as:  SYNTHROID, LEVOTHROID  Take 1 tablet (50 mcg total) by mouth daily before breakfast.     LUMIGAN 0.03 % ophthalmic solution  Generic drug:  bimatoprost  Place into both eyes at bedtime and may repeat dose one time if needed.     nitroGLYCERIN 0.4 MG SL tablet  Commonly known as:  NITROSTAT  Place 0.4 mg under the tongue every 5 (five) minutes as needed for chest pain.     NONFORMULARY OR COMPOUNDED ITEM  - Diabetic testing supply  - Check glucose once daily if possible  - Fasting or morning glucose recommended M, W, F, & Sun if possible Goal= 100-150  - Glucose 2 hours after breakfast Tue, after lunch Th & 2 hrs after eve meal Sat if possible Goal = < 180     rosuvastatin 10 MG tablet  Commonly known as:  CRESTOR  Take 10 mg by mouth 2 (two) times a week. Takes on Sundays and Wednesdays        Allergies:  Allergies  Allergen Reactions  . Atorvastatin     REACTION: myalgias  . Folic Acid     REACTION: Face burns  . Nicardipine Hcl     REACTION: Gingival hypertrophy  . Pravastatin     myalgias    Past Medical History  Diagnosis Date  . CAD (coronary artery disease)   . Hypertension   . Ejection fraction     45 %, echo, March, 2012,   /  Ejection fraction after his MI March, 2013 is 45%. There is posterior basal akinesis with abnormal septal motion.  . Aortic insufficiency     Moderate, echo, March, 2012  . Mitral regurgitation     Moderate, echo, March, 2012  . Pulmonary hypertension     Echo, March, 2012, 42 mmHg  . Hx of CABG   . Diabetes  mellitus   . Dyslipidemia   . Hypothyroidism   . Bradycardia     Sinus bradycardia  . RBBB (right bundle branch block with left anterior fascicular block)     New July, 2010  . Leg cramps     Nighttime  . Shingles     Severe pain  to right flank treated  . Ischemia     .Marland KitchenMarland KitchenEF 42%  . Hyperlipidemia   . Dizziness      12/2009 &12/ 2012  . Abnormal LFTs     LFTs were abnormal with acute MI March, 2013, improving at the time of discharge  . Myocardial infarction 3/19-27/2013    ARF, elevated LFTs  . Systolic CHF   . Elevated CPK     2013, now in 2014 patient on very low dose Crestor    Past Surgical History  Procedure Laterality Date  . Coronary artery bypass graft  1996    5 vessels  . Back surgery  1999    LS sx- 1999  . Spinal fusion  2000    scar tissue, plate insertion/fusion @ [facility] - 2000  . Cardiac surgery    . Cardiac catheterization  2013    occluded graft; Dr Lia Foyer    Family History  Problem Relation Age of Onset  . Diabetes Sister   . Heart attack Father 65  . Lung cancer Sister   . Stroke Neg Hx   . Diabetes Brother     Social History:  reports that he quit smoking about 64 years ago. He has never used smokeless tobacco. He reports that he does not drink alcohol or use illicit drugs.    Review of Systems       Vision is normal. Most recent eye exam was        Lipids: On Crestor 2/7 days a week, apparently has had some intolerance to other statin drugs       Lab Results  Component Value Date   CHOL 162 02/05/2014   HDL 38* 02/05/2014   LDLCALC 89 02/05/2014   LDLDIRECT 164.1 03/24/2012   TRIG 176* 02/05/2014   CHOLHDL 4.3 02/05/2014                  Skin: No rash or infections     Thyroid:  He thinks he was feeling tired a few weeks ago but not as much now. Started on levothyroxine in 9/15 when discharged from the hospital for his MI  Lab Results  Component Value Date   TSH 9.740* 02/05/2014   TSH 3.92 06/28/2013   TSH 3.35 03/24/2012        The blood pressure has been well controlled     No swelling of feet.     No shortness of breath or recent chest tightness  on exertion.     Bowel habits: Normal.      No joint  pains.          No history of Numbness, tingling  or burning in feet       Does not have calf pain on walking   LABS:  No visits with results within 1 Week(s) from this visit. Latest known visit with results is:  Appointment on 02/12/2014  Component Date Value Ref Range Status  . Microalb, Ur 02/12/2014 2.6* 0.0 - 1.9 mg/dL Final  . Creatinine,U 02/12/2014 201.4   Final  . Microalb Creat Ratio 02/12/2014 1.3  0.0 - 30.0 mg/g Final  . Hemoglobin A1C  02/12/2014 9.0* 4.6 - 6.5 % Final   Glycemic Control Guidelines for People with Diabetes:Non Diabetic:  <6%Goal of Therapy: <7%Additional Action Suggested:  >8%     Physical Examination:  BP 128/70  Pulse 60  Temp(Src) 98 F (36.7 C)  Resp 16  Ht 5\' 9"  (1.753 m)  Wt 186 lb (84.369 kg)  BMI 27.45 kg/m2  SpO2 98%  GENERAL:         Patient  is averagely built and nourished HEENT:         Eye exam shows normal external appearance. Fundus exam shows no retinopathy, left side not clearly visible.  Oral exam shows normal mucosa .  NECK:         General:  Neck exam shows no lymphadenopathy.  Carotids show a loud right carotid bruit and a soft short left carotid bruit Thyroid is not enlarged and no nodules felt.   LUNGS:         Chest is symmetrical. Lungs are clear to auscultation.Marland Kitchen   HEART:         Heart sounds:  S1 and S2 are normal. No murmurs or clicks heard., no S3 or S4.   ABDOMEN:   There is no distention present. Liver and spleen are not palpable. No other mass or tenderness present.  EXTREMITIES:     There is no edema. No skin lesions present.Marland Kitchen  NEUROLOGICAL:   Vibration sense is mildly reduced in left and moderately reduced in the right toes. Ankle jerks are absent bilaterally.   Diabetic foot exam shows normal monofilament sensation in the toes and plantar surfaces, no skin lesions or ulcers on the feet and absent pedal pulses MUSCULOSKELETAL:       There is no enlargement or deformity of the joints. Spine is normal to inspection.Marland Kitchen   SKIN:       No rash or lesions of  concern.        ASSESSMENT:  Diabetes type 2, uncontrolled with BMI 27 Although he has had diabetes only for 4 years he appears to have significant hyperglycemia even with taking basal insulin His fasting readings are well controlled but he has not checked his readings after meals See history of present illness for discussion of his current management, problems identified and level of control Discussed with the patient that He is likely to be insulin deficient especially since he is not obese Today his glucose is over 300 nonfasting in the office Also not clear whether his Levemir is working consistently over 24 hours His diet is generally balanced and limited in portions but he has not had meal planning instructions in a while  Complications: None evident except for vascular disease, no microalbuminuria  History of coronary artery disease, hypertension, evidence of carotid and peripheral vascular disease on exam  Hypertension: Well controlled  ? Hypothyroidism: Not clear if he has symptomatic hypothyroidism but currently is on 50 mcg daily and will check his TSH on the next visit  PLAN:   Start mealtime insulin.  Discussed in detail the need for mealtime insulin coverage to control postprandial readings as well as the timing of insulin, duration of action, need for adjustment of the dose based on postprandial blood sugar targets and possible hypoglycemia  He was given prescription for Humalog and a booklet on mealtime insulin. Also given a coupon for a free box of insulin pens. Most likely will need to give him regular insulin in the future since he cannot afford brand name insulin  Discussed  need for monitoring after meals consistently and blood sugar targets after eating  As a precaution will reduce his Levemir to 45 units  Consider consultation with dietitian  He will followup with nurse educator next week for further in-depth education and adjustment of insulin doses  Other  recommendations: Will defer the following to PCP and cardiologist 1. Consider carotid ultrasound as he has a significant bruit 2. Consider increasing Crestor to at least every other day since he does need high-dose statins  Total visit time including counseling = 60 minutes  Icelyn Navarrete 02/28/2014, 11:49 AM   Note: This office note was prepared with Estate agent. Any transcriptional errors that result from this process are unintentional.

## 2014-03-01 ENCOUNTER — Other Ambulatory Visit: Payer: Self-pay

## 2014-03-01 ENCOUNTER — Telehealth: Payer: Self-pay | Admitting: Internal Medicine

## 2014-03-01 MED ORDER — INSULIN DETEMIR 100 UNIT/ML FLEXPEN: 38.0000 [IU] | PEN_INJECTOR | Freq: Every day | SUBCUTANEOUS | Status: DC

## 2014-03-01 MED ORDER — INSULIN DETEMIR 100 UNIT/ML ~~LOC~~ SOLN: 38.0000 [IU] | Freq: Every day | SUBCUTANEOUS | Status: DC

## 2014-03-01 NOTE — Telephone Encounter (Signed)
Pt called stated that he suppose to get Levemir in [place] not in [place]. Please change it and send Rx into [facility]. Pt is trying to get his done today because he is out of this med.

## 2014-03-08 DIAGNOSIS — I10 Essential (primary) hypertension: Secondary | ICD-10-CM

## 2014-03-08 DIAGNOSIS — E1159 Type 2 diabetes mellitus with other circulatory complications: Secondary | ICD-10-CM | POA: Diagnosis not present

## 2014-03-08 DIAGNOSIS — I214 Non-ST elevation (NSTEMI) myocardial infarction: Secondary | ICD-10-CM | POA: Diagnosis not present

## 2014-03-13 ENCOUNTER — Other Ambulatory Visit: Payer: Self-pay | Admitting: Cardiology

## 2014-03-14 ENCOUNTER — Other Ambulatory Visit: Payer: Medicare Other

## 2014-03-14 ENCOUNTER — Other Ambulatory Visit (INDEPENDENT_AMBULATORY_CARE_PROVIDER_SITE_OTHER): Payer: Medicare Other

## 2014-03-14 DIAGNOSIS — E039 Hypothyroidism, unspecified: Secondary | ICD-10-CM

## 2014-03-14 DIAGNOSIS — E1165 Type 2 diabetes mellitus with hyperglycemia: Secondary | ICD-10-CM | POA: Diagnosis not present

## 2014-03-14 DIAGNOSIS — IMO0002 Reserved for concepts with insufficient information to code with codable children: Secondary | ICD-10-CM

## 2014-03-14 LAB — COMPREHENSIVE METABOLIC PANEL
ALT: 15 U/L (ref 0–53)
AST: 17 U/L (ref 0–37)
Albumin: 3.5 g/dL (ref 3.5–5.2)
Alkaline Phosphatase: 42 U/L (ref 39–117)
BUN: 21 mg/dL (ref 6–23)
CHLORIDE: 110 meq/L (ref 96–112)
CO2: 19 mEq/L (ref 19–32)
Calcium: 9.3 mg/dL (ref 8.4–10.5)
Creatinine, Ser: 1.4 mg/dL (ref 0.4–1.5)
GFR: 52.38 mL/min — ABNORMAL LOW (ref >60.00)
Glucose, Bld: 103 mg/dL — ABNORMAL HIGH (ref 70–99)
Potassium: 4.1 mEq/L (ref 3.5–5.1)
Sodium: 141 mEq/L (ref 135–145)
Total Bilirubin: 0.9 mg/dL (ref 0.2–1.2)
Total Protein: 7.1 g/dL (ref 6.0–8.3)

## 2014-03-15 ENCOUNTER — Other Ambulatory Visit: Payer: Self-pay | Admitting: Cardiology

## 2014-03-15 LAB — TSH: TSH: 3.6 u[IU]/mL (ref 0.35–4.50)

## 2014-03-15 LAB — T4, FREE: FREE T4: 1.2 ng/dL (ref 0.60–1.60)

## 2014-03-20 ENCOUNTER — Encounter: Payer: Self-pay | Admitting: Endocrinology

## 2014-03-20 ENCOUNTER — Ambulatory Visit (INDEPENDENT_AMBULATORY_CARE_PROVIDER_SITE_OTHER): Payer: Medicare Other | Admitting: Endocrinology

## 2014-03-20 VITALS — BP 138/64 | HR 65 | Temp 98.3°F | Resp 14 | Ht 69.0 in | Wt 187.8 lb

## 2014-03-20 DIAGNOSIS — E785 Hyperlipidemia, unspecified: Secondary | ICD-10-CM | POA: Diagnosis not present

## 2014-03-20 DIAGNOSIS — E039 Hypothyroidism, unspecified: Secondary | ICD-10-CM

## 2014-03-20 DIAGNOSIS — E1165 Type 2 diabetes mellitus with hyperglycemia: Secondary | ICD-10-CM | POA: Diagnosis not present

## 2014-03-20 DIAGNOSIS — IMO0002 Reserved for concepts with insufficient information to code with codable children: Secondary | ICD-10-CM

## 2014-03-20 DIAGNOSIS — I200 Unstable angina: Secondary | ICD-10-CM | POA: Diagnosis not present

## 2014-03-20 NOTE — Patient Instructions (Addendum)
Humalog 5 units before breakfast and supper  May need  To go up 1-2 units if sugar 2 hrs after meal  Please check blood sugars at least half the time about 2 hours after any meal and times per week on waking up. Please bring blood sugar monitor to each visit  Levemir 40 units

## 2014-03-20 NOTE — Progress Notes (Signed)
Patient ID: [name], male   DOB: 05/31/1925, 80 y.o.   MRN: [id number]            Reason for Appointment:  for Type 2 Diabetes  Referring physician: Unice Cobble M.D.  History of Present Illness:          Diagnosis: Type 2 diabetes mellitus, date of diagnosis: 2011       Past history: He probably was diagnosed to have diabetes on routine lab testing At that time he was started on metformin and this was continued until about a year ago He had fair control of his diabetes initially but most of his A1c readings have been over 7% His A1c increased significantly in 2014 to 9.9% He was probably started on Levemir insulin in 6/14 and metformin discontinued He was told to increase his dose progressively to get his fasting blood sugars down and for about 3 months has been taking  48 units  Recent history:  He has had a persistently high A1c of 9%  He is taking only Levemir at bedtime  Even though he was instructed on taking Humalog with at least his evening meal and explained the rationale for doing this he still has not started this as he was a little confused about it and his wife also did not understand what to do His glucose is still over 200 frequently after his meals whenever he checks them He has checked his blood sugars only rarely after meals and none after supper despite instructions given on his previous visit in writing Also did not bring his monitor for download; He says that he shares the monitor with his son He has however reduced his Levemir on his own by 4 units and he thinks fasting glucose is still fairly good       Oral hypoglycemic drugs the patient is taking are:   none     Side effects from medications have been: None INSULIN regimen is described as: 44 Levemir hs  Compliance with the medical regimen: poor Hypoglycemia:   never  Glucose monitoring:  done one time a day         Glucometer: One Touch    Blood Glucose readings from recall    PREMEAL  Breakfast Lunch Dinner Bedtime Overall  Glucose range: 96-118      Mean/median:        POST-MEAL PC Breakfast PC Lunch PC Dinner  Glucose range: 200+ 215 ?  Mean/median:       Self-care: The diet that the patient has been following is: tries to limit fat intake and sodium.     Meals: 2-3 meals per day. Dinner 4 pm, will have 1/2 sandwich at bedtime. Breakfast is an egg and toast usually, may not have lunch         Exercise: Golf off and on           Dietician visit, most recent: Years ago.               Weight history: Wt Readings from Last 3 Encounters:  03/20/14 187 lb 12.8 oz (85.186 kg)  02/28/14 186 lb (84.369 kg)  02/13/14 182 lb 1.9 oz (82.609 kg)    Glycemic control:   Lab Results  Component Value Date   HGBA1C 9.0* 02/12/2014   HGBA1C 9.4* 06/28/2013   HGBA1C 9.7* 11/14/2012   Lab Results  Component Value Date   MICROALBUR 2.6* 02/12/2014   LDLCALC 89 02/05/2014   CREATININE 1.4 03/14/2014  Medication List       This list is accurate as of: 03/20/14  3:47 PM.  Always use your most recent med list.               acetaminophen 325 MG tablet  Commonly known as:  TYLENOL  Take 650 mg by mouth every 6 (six) hours as needed for mild pain or moderate pain.     amLODipine 10 MG tablet  Commonly known as:  NORVASC  TAKE 1 TABLET BY MOUTH EVERY DAY     aspirin EC 81 MG tablet  Take 81 mg by mouth every morning.     benazepril 40 MG tablet  Commonly known as:  LOTENSIN  Take 1 tablet (40 mg total) by mouth daily.     carvedilol 3.125 MG tablet  Commonly known as:  COREG  Take 1 tablet (3.125 mg total) by mouth every morning.     chlorhexidine 0.12 % solution  Commonly known as:  PERIDEX  Use as directed 15 mLs in the mouth or throat 2 (two) times daily.     cholecalciferol 1000 UNITS tablet  Commonly known as:  VITAMIN D  Take 2,000 Units by mouth daily.     clopidogrel 75 MG tablet  Commonly known as:  PLAVIX  Take 75 mg by mouth  daily.     dorzolamide 2 % ophthalmic solution  Commonly known as:  TRUSOPT  1 drop both eyes in the evening     furosemide 40 MG tablet  Commonly known as:  LASIX  Take 20 mg by mouth daily as needed for fluid.     glimepiride 2 MG tablet  Commonly known as:  AMARYL  Take 2 mg by mouth daily with breakfast.     Insulin Detemir 100 UNIT/ML Pen  Commonly known as:  LEVEMIR  Inject 38 Units into the skin at bedtime.     insulin lispro 100 UNIT/ML KiwkPen  Commonly known as:  HUMALOG KWIKPEN  As directed before meals     isosorbide mononitrate 30 MG 24 hr tablet  Commonly known as:  IMDUR  Take 30 mg by mouth daily.     levothyroxine 50 MCG tablet  Commonly known as:  SYNTHROID, LEVOTHROID  Take 1 tablet (50 mcg total) by mouth daily before breakfast.     LUMIGAN 0.03 % ophthalmic solution  Generic drug:  bimatoprost  Place into both eyes at bedtime and may repeat dose one time if needed.     LUMIGAN 0.01 % Soln  Generic drug:  bimatoprost     nitroGLYCERIN 0.4 MG SL tablet  Commonly known as:  NITROSTAT  Place 0.4 mg under the tongue every 5 (five) minutes as needed for chest pain.     NONFORMULARY OR COMPOUNDED ITEM  - Diabetic testing supply  - Check glucose once daily if possible  - Fasting or morning glucose recommended M, W, F, & Sun if possible Goal= 100-150  - Glucose 2 hours after breakfast Tue, after lunch Th & 2 hrs after eve meal Sat if possible Goal = < 180     rosuvastatin 10 MG tablet  Commonly known as:  CRESTOR  Take 10 mg by mouth 2 (two) times a week. Takes on Sundays and Wednesdays        Allergies:  Allergies  Allergen Reactions  . Atorvastatin     REACTION: myalgias  . Folic Acid     REACTION: Face burns  . Nicardipine Hcl  REACTION: Gingival hypertrophy  . Pravastatin     myalgias    Past Medical History  Diagnosis Date  . CAD (coronary artery disease)   . Hypertension   . Ejection fraction     45%, echo, March, 2012,    /  Ejection fraction after his MI March, 2013 is 45%. There is posterior basal akinesis with abnormal septal motion.  . Aortic insufficiency     Moderate, echo, March, 2012  . Mitral regurgitation     Moderate, echo, March, 2012  . Pulmonary hypertension     Echo, March, 2012, 42 mmHg  . Hx of CABG   . Diabetes mellitus   . Dyslipidemia   . Hypothyroidism   . Bradycardia     Sinus bradycardia  . RBBB (right bundle branch block with left anterior fascicular block)     New July, 2010  . Leg cramps     Nighttime  . Shingles     Severe pain to right flank treated  . Ischemia     .Marland KitchenMarland KitchenEF 42%  . Hyperlipidemia   . Dizziness      12/2009 &12/ 2012  . Abnormal LFTs     LFTs were abnormal with acute MI March, 2013, improving at the time of discharge  . Myocardial infarction 3/19-27/2013    ARF, elevated LFTs  . Systolic CHF   . Elevated CPK     2013, now in 2014 patient on very low dose Crestor    Past Surgical History  Procedure Laterality Date  . Coronary artery bypass graft  1996    5 vessels  . Back surgery  1999    LS sx- 1999  . Spinal fusion  2000    scar tissue, plate insertion/fusion @ [facility] - 2000  . Cardiac surgery    . Cardiac catheterization  2013    occluded graft; Dr Lia Foyer    Family History  Problem Relation Age of Onset  . Diabetes Sister   . Heart attack Father 24  . Lung cancer Sister   . Stroke Neg Hx   . Diabetes Brother     Social History:  reports that he quit smoking about 64 years ago. He has never used smokeless tobacco. He reports that he does not drink alcohol or use illicit drugs.    Review of Systems          Lipids: On Crestor 2/7 days a week, apparently has had some intolerance to other statin drugs LDL above his target       Lab Results  Component Value Date   CHOL 162 02/05/2014   HDL 38* 02/05/2014   LDLCALC 89 02/05/2014   LDLDIRECT 164.1 03/24/2012   TRIG 176* 02/05/2014   CHOLHDL 4.3 02/05/2014                   Thyroid:  He thinks he was feeling tired a few weeks ago but not as much now.  Started on levothyroxine in 9/15 when discharged from the hospital for his MI Follow-up TSH is now normal  Lab Results  Component Value Date   FREET4 1.20 03/14/2014   TSH 3.60 03/14/2014   TSH 9.740* 02/05/2014   TSH 3.92 06/28/2013      LABS:  Appointment on 03/14/2014  Component Date Value Ref Range Status  . TSH 03/14/2014 3.60  0.35 - 4.50 uIU/mL Final  . Free T4 03/14/2014 1.20  0.60 - 1.60 ng/dL Final  . Sodium 03/14/2014 141  135 - 145 mEq/L Final  . Potassium 03/14/2014 4.1  3.5 - 5.1 mEq/L Final  . Chloride 03/14/2014 110  96 - 112 mEq/L Final  . CO2 03/14/2014 19  19 - 32 mEq/L Final  . Glucose, Bld 03/14/2014 103* 70 - 99 mg/dL Final  . BUN 03/14/2014 21  6 - 23 mg/dL Final  . Creatinine, Ser 03/14/2014 1.4  0.4 - 1.5 mg/dL Final  . Total Bilirubin 03/14/2014 0.9  0.2 - 1.2 mg/dL Final  . Alkaline Phosphatase 03/14/2014 42  39 - 117 U/L Final  . AST 03/14/2014 17  0 - 37 U/L Final  . ALT 03/14/2014 15  0 - 53 U/L Final  . Total Protein 03/14/2014 7.1  6.0 - 8.3 g/dL Final  . Albumin 03/14/2014 3.5  3.5 - 5.2 g/dL Final  . Calcium 03/14/2014 9.3  8.4 - 10.5 mg/dL Final  . GFR 03/14/2014 52.38* >60.00 mL/min Final    Physical Examination:  BP 138/64 mmHg  Pulse 65  Temp(Src) 98.3 F (36.8 C)  Resp 14  Ht 5\' 9"  (1.753 m)  Wt 187 lb 12.8 oz (85.186 kg)  BMI 27.72 kg/m2  SpO2 96%       exam not indicated    ASSESSMENT:  Diabetes type 2, uncontrolled with BMI 27 His fasting readings are well controlled with basal only regimen but he has persistent significant postprandial hyperglycemia Despite detailed discussion he is not understanding the need for covering mealtime hyperglycemia and use of the Humalog insulin for this   Hypothyroidism: although probably symptomatic from this his TSH was baseline about 9 and is now back to normal with low-dose supplement, he can continue this  for now  Asymptomatic carotid bruit: He will discuss this with cardiologist  Hypercholesterolemia: Needs more aggressive control  PLAN:   Start mealtime insulin with 5 units twice a day at his breakfast and supper.  Discussed in detail the need for mealtime insulin as well as the timing of the injection, adjustment of the dose based on postprandial readings  Discussed need for doing more postprandial readings and does not need fasting readings every day  Reduce Levemir for now as fasting readings should improve with controlling evening postprandial hyperglycemia  Consider consultation with dietitian  He will followup with nurse educator next week for further in-depth education and adjustment of insulin doses  Patient Instructions  Humalog 5 units before breakfast and supper  May need  To go up 1-2 units if sugar 2 hrs after meal  Please check blood sugars at least half the time about 2 hours after any meal and times per week on waking up. Please bring blood sugar monitor to each visit  Levemir 40 units  Counseling time over 50% of today's 25 minute visit  Blanche Gallien 03/20/2014, 3:47 PM   Note: This office note was prepared with Dragon voice recognition system technology. Any transcriptional errors that result from this process are unintentional.

## 2014-03-26 ENCOUNTER — Ambulatory Visit: Payer: Medicare Other | Admitting: Cardiology

## 2014-04-05 ENCOUNTER — Other Ambulatory Visit: Payer: Self-pay

## 2014-04-05 MED ORDER — CLOPIDOGREL BISULFATE 75 MG PO TABS: 75.0000 mg | ORAL_TABLET | Freq: Every day | ORAL | Status: DC

## 2014-04-23 ENCOUNTER — Telehealth: Payer: Self-pay | Admitting: Cardiology

## 2014-04-23 NOTE — Telephone Encounter (Signed)
Noted  

## 2014-04-23 NOTE — Telephone Encounter (Signed)
New Msg   Renae calling from Dr. Jackson Latino  Office at [phone]. She wanted to confirm that fax sent was in regards to Mr. [name] with DOB February 23, 1926.

## 2014-04-25 ENCOUNTER — Encounter (HOSPITAL_COMMUNITY): Payer: Self-pay | Admitting: Cardiology

## 2014-05-01 ENCOUNTER — Ambulatory Visit: Payer: Medicare Other | Admitting: Family Medicine

## 2014-05-01 ENCOUNTER — Ambulatory Visit: Payer: Medicare Other | Admitting: Nurse Practitioner

## 2014-05-03 ENCOUNTER — Ambulatory Visit (INDEPENDENT_AMBULATORY_CARE_PROVIDER_SITE_OTHER): Payer: Medicare Other | Admitting: Cardiology

## 2014-05-03 ENCOUNTER — Encounter: Payer: Self-pay | Admitting: Cardiology

## 2014-05-03 VITALS — BP 130/72 | HR 72 | Ht 69.0 in | Wt 181.0 lb

## 2014-05-03 DIAGNOSIS — I5022 Chronic systolic (congestive) heart failure: Secondary | ICD-10-CM | POA: Diagnosis not present

## 2014-05-03 DIAGNOSIS — I2510 Atherosclerotic heart disease of native coronary artery without angina pectoris: Secondary | ICD-10-CM | POA: Diagnosis not present

## 2014-05-03 DIAGNOSIS — I200 Unstable angina: Secondary | ICD-10-CM

## 2014-05-03 NOTE — Assessment & Plan Note (Signed)
Volume status is stable. No change in therapy. 

## 2014-05-03 NOTE — Assessment & Plan Note (Signed)
Coronary disease is stable. He is cleared for his gum surgery. He will take a last dose of Plavix on January 9. His gum surgery will be January 14. He will resume his Plavix as soon afterwards as he cam based on bleeding.

## 2014-05-03 NOTE — Progress Notes (Signed)
Patient ID: [name], male   DOB: 07-06-1925, 80 y.o.   MRN: [id number]     HPI Patient is seen to follow up coronary disease and CHF. He is stable. He also needs recommendation about some gum surgery. He's not having chest pain or shortness of breath. He has not required his diuretics. He is on aspirin and Plavix at this time.  Allergies  Allergen Reactions  . Atorvastatin     REACTION: myalgias  . Folic Acid     REACTION: Face burns  . Nicardipine Hcl     REACTION: Gingival hypertrophy  . Pravastatin     myalgias    Current Outpatient Prescriptions  Medication Sig Dispense Refill  . acetaminophen (TYLENOL) 325 MG tablet Take 650 mg by mouth every 6 (six) hours as needed for mild pain or moderate pain.     Marland Kitchen amLODipine (NORVASC) 10 MG tablet TAKE 1 TABLET BY MOUTH EVERY DAY 30 tablet 3  . aspirin EC 81 MG tablet Take 81 mg by mouth every morning.    . benazepril (LOTENSIN) 40 MG tablet Take 1 tablet (40 mg total) by mouth daily. 30 tablet 3  . carvedilol (COREG) 3.125 MG tablet Take 1 tablet (3.125 mg total) by mouth every morning. 30 tablet 3  . chlorhexidine (PERIDEX) 0.12 % solution Use as directed 15 mLs in the mouth or throat 2 (two) times daily.     . cholecalciferol (VITAMIN D) 1000 UNITS tablet Take 2,000 Units by mouth daily.    . clopidogrel (PLAVIX) 75 MG tablet Take 1 tablet (75 mg total) by mouth daily. 30 tablet 6  . dorzolamide (TRUSOPT) 2 % ophthalmic solution 1 drop both eyes in the evening    . furosemide (LASIX) 40 MG tablet Take 20 mg by mouth daily as needed for fluid.    Marland Kitchen glimepiride (AMARYL) 2 MG tablet Take 2 mg by mouth daily with breakfast.    . Insulin Detemir (LEVEMIR) 100 UNIT/ML Pen Inject 38 Units into the skin at bedtime. 3 pen 3  . insulin lispro (HUMALOG KWIKPEN) 100 UNIT/ML KiwkPen As directed before meals 15 mL 1  . isosorbide mononitrate (IMDUR) 30 MG 24 hr tablet Take 30 mg by mouth daily.    Marland Kitchen levothyroxine (SYNTHROID, LEVOTHROID) 50  MCG tablet Take 1 tablet (50 mcg total) by mouth daily before breakfast. 30 tablet 11  . LUMIGAN 0.01 % SOLN   4  . LUMIGAN 0.03 % ophthalmic solution Place into both eyes at bedtime and may repeat dose one time if needed.     . nitroGLYCERIN (NITROSTAT) 0.4 MG SL tablet Place 0.4 mg under the tongue every 5 (five) minutes as needed for chest pain.    . NONFORMULARY OR COMPOUNDED ITEM Diabetic testing supply Check glucose once daily if possible Fasting or morning glucose recommended M, W, F, & Sun if possible Goal= 100-150 Glucose 2 hours after breakfast Tue, after lunch Th & 2 hrs after eve meal Sat if possible Goal = < 180 1 each 0  . rosuvastatin (CRESTOR) 10 MG tablet Take 10 mg by mouth 2 (two) times a week. Takes on Sundays and Wednesdays    . [DISCONTINUED] pravastatin (PRAVACHOL) 20 MG tablet Take 20 mg by mouth daily.       No current facility-administered medications for this visit.    History   Social History  . Marital Status: Married    Spouse Name: N/A    Number of Children: N/A  . Years  of Education: N/A   Occupational History  . Not on file.   Social History Main Topics  . Smoking status: Former Smoker -- 1.00 packs/day for 5 years    Quit date: 05/17/1949  . Smokeless tobacco: Never Used     Comment: Quit at age 13  . Alcohol Use: No  . Drug Use: No  . Sexual Activity: Not on file   Other Topics Concern  . Not on file   Social History Narrative    Family History  Problem Relation Age of Onset  . Diabetes Sister   . Heart attack Father 24  . Lung cancer Sister   . Stroke Neg Hx   . Diabetes Brother     Past Medical History  Diagnosis Date  . CAD (coronary artery disease)   . Hypertension   . Ejection fraction     45%, echo, March, 2012,   /  Ejection fraction after his MI March, 2013 is 45%. There is posterior basal akinesis with abnormal septal motion.  . Aortic insufficiency     Moderate, echo, March, 2012  . Mitral regurgitation      Moderate, echo, March, 2012  . Pulmonary hypertension     Echo, March, 2012, 42 mmHg  . Hx of CABG   . Diabetes mellitus   . Dyslipidemia   . Hypothyroidism   . Bradycardia     Sinus bradycardia  . RBBB (right bundle branch block with left anterior fascicular block)     New July, 2010  . Leg cramps     Nighttime  . Shingles     Severe pain to right flank treated  . Ischemia     .Marland KitchenMarland KitchenEF 42%  . Hyperlipidemia   . Dizziness      12/2009 &12/ 2012  . Abnormal LFTs     LFTs were abnormal with acute MI March, 2013, improving at the time of discharge  . Myocardial infarction 3/19-27/2013    ARF, elevated LFTs  . Systolic CHF   . Elevated CPK     2013, now in 2014 patient on very low dose Crestor    Past Surgical History  Procedure Laterality Date  . Coronary artery bypass graft  1996    5 vessels  . Back surgery  1999    LS sx- 1999  . Spinal fusion  2000    scar tissue, plate insertion/fusion @ [facility] - 2000  . Cardiac surgery    . Cardiac catheterization  2013    occluded graft; Dr Lia Foyer  . Left heart catheterization with coronary angiogram N/A 08/03/2011    Procedure: LEFT HEART CATHETERIZATION WITH CORONARY ANGIOGRAM;  Surgeon: Hillary Bow, MD;  Location: [facility] CATH LAB;  Service: Cardiovascular;  Laterality: N/A;  . Left heart catheterization with coronary/graft angiogram N/A 02/05/2014    Procedure: LEFT HEART CATHETERIZATION WITH Beatrix Fetters;  Surgeon: Troy Sine, MD;  Location: [facility] CATH LAB;  Service: Cardiovascular;  Laterality: N/A;    Patient Active Problem List   Diagnosis Date Noted  . Bilateral carotid bruits 02/28/2014  . Periodontal disease 02/13/2014  . CAD (coronary artery disease)   . Statin intolerance 07/20/2013  . Chronic systolic CHF (congestive heart failure) 08/31/2012  . Elevated CPK   . Abnormal LFTs   . Ejection fraction   . Hypokalemia 08/10/2011  . Renal insufficiency 08/03/2011  . Hypertension   . Aortic insufficiency    . Mitral regurgitation   . Pulmonary hypertension   . Hx  of CABG   . Dyslipidemia   . Hypothyroidism-TSH 9   . Bradycardia   . RBBB with LAFB   . Leg cramps   . Shingles   . Dizziness   . POSTHERPETIC NEURALGIA 04/29/2009  . VITAMIN D DEFICIENCY 02/17/2009  . ARTHRALGIA 02/12/2009  . UNSPECIFIED MYALGIA AND MYOSITIS 02/12/2009  . SHORTNESS OF BREATH 11/28/2008  . THROMBOCYTOPENIA 08/12/2008  . ADVERSE DRUG REACTION 07/16/2008  . Type II or unspecified type diabetes mellitus with peripheral circulatory disorders, uncontrolled(250.72) 01/16/2008  . GLAUCOMA, BORDERLINE 10/16/2007    ROS  Patient denies fever, chills, headache, sweats, rash, change in vision, change in hearing, chest pain, cough, nausea or vomiting, urinary symptoms. All other systems are reviewed and are negative.  PHYSICAL EXAM Patient is stable. He is oriented to person time and place. Affect is normal. He just turned 88. He is here with his wife. Head is atraumatic. Sclera and conjunctiva are normal. There is no jugular venous distention. Lungs are clear. Respiratory effort is not labored. Cardiac exam reveals S1 and S2. There are no clicks or significant murmurs. The abdomen is soft. There is no peripheral edema.  Filed Vitals:   05/03/14 1345  BP: 130/72  Pulse: 72  Height: 5\' 9"  (1.753 m)  Weight: 181 lb (82.101 kg)     ASSESSMENT & PLAN

## 2014-05-03 NOTE — Patient Instructions (Signed)
Your physician recommends that you continue on your current medications as directed. Please refer to the Current Medication list given to you today.  Take your last dose of Plavix January 8th, 2016.  Your physician recommends that you schedule a follow-up appointment in: 3 months with Dr. Ron Parker.

## 2014-05-18 ENCOUNTER — Other Ambulatory Visit: Payer: Self-pay | Admitting: Cardiology

## 2014-05-27 ENCOUNTER — Other Ambulatory Visit: Payer: Self-pay | Admitting: Cardiology

## 2014-06-01 ENCOUNTER — Other Ambulatory Visit: Payer: Self-pay | Admitting: Cardiology

## 2014-06-10 ENCOUNTER — Other Ambulatory Visit: Payer: Self-pay | Admitting: Cardiology

## 2014-06-19 ENCOUNTER — Other Ambulatory Visit: Payer: Self-pay

## 2014-06-19 MED ORDER — INSULIN PEN NEEDLE 31G X 6 MM MISC: Status: DC

## 2014-07-15 ENCOUNTER — Other Ambulatory Visit: Payer: Self-pay | Admitting: Cardiology

## 2014-07-19 ENCOUNTER — Other Ambulatory Visit: Payer: Self-pay | Admitting: Internal Medicine

## 2014-08-09 ENCOUNTER — Encounter: Payer: Self-pay | Admitting: Cardiology

## 2014-08-09 ENCOUNTER — Ambulatory Visit (INDEPENDENT_AMBULATORY_CARE_PROVIDER_SITE_OTHER): Payer: Medicare Other | Admitting: Cardiology

## 2014-08-09 VITALS — BP 138/60 | HR 60 | Ht 69.0 in | Wt 183.0 lb

## 2014-08-09 DIAGNOSIS — I10 Essential (primary) hypertension: Secondary | ICD-10-CM

## 2014-08-09 DIAGNOSIS — I5022 Chronic systolic (congestive) heart failure: Secondary | ICD-10-CM | POA: Diagnosis not present

## 2014-08-09 DIAGNOSIS — I2510 Atherosclerotic heart disease of native coronary artery without angina pectoris: Secondary | ICD-10-CM

## 2014-08-09 NOTE — Assessment & Plan Note (Signed)
The patient is not having any significant chest pain. He has significant coronary disease. Fortunately he remained stable. No further workup.

## 2014-08-09 NOTE — Progress Notes (Signed)
Cardiology Office Note   Date:  08/09/2014   ID:  [name], DOB [birth year], MRN [id number]  PCP:  Unice Cobble, MD  Cardiologist:  Dola Argyle, MD   Chief Complaint  Patient presents with  . Appointment    Follow-up coronary artery disease and CHF.      History of Present Illness: [name] is a 81 y.o. male who presents today to follow-up coronary disease and CHF. He is 81 years of age. 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 Fortunately he is still able to play some golf. He has significant coronary disease but it has been stable. His volume status is stable.    Past Medical History  Diagnosis Date  . CAD (coronary artery disease)   . Hypertension   . Ejection fraction     45%, echo, March, 2012,   /  Ejection fraction after his MI March, 2013 is 45%. There is posterior basal akinesis with abnormal septal motion.  . Aortic insufficiency     Moderate, echo, March, 2012  . Mitral regurgitation     Moderate, echo, March, 2012  . Pulmonary hypertension     Echo, March, 2012, 42 mmHg  . Hx of CABG   . Diabetes mellitus   . Dyslipidemia   . Hypothyroidism   . Bradycardia     Sinus bradycardia  . RBBB (right bundle branch block with left anterior fascicular block)     New July, 2010  . Leg cramps     Nighttime  . Shingles     Severe pain to right flank treated  . Ischemia     .Marland KitchenMarland KitchenEF 42%  . Hyperlipidemia   . Dizziness      12/2009 &12/ 2012  . Abnormal LFTs     LFTs were abnormal with acute MI March, 2013, improving at the time of discharge  . Myocardial infarction 3/19-27/2013    ARF, elevated LFTs  . Systolic CHF   . Elevated CPK     2013, now in 2014 patient on very low dose Crestor    Past Surgical History  Procedure Laterality Date  . Coronary artery bypass graft  1996    5 vessels  . Back surgery  1999    [facility] sx- 1999  . Spinal fusion  2000    scar tissue, plate insertion/fusion @ [facility] - 2000  . Cardiac  surgery    . Cardiac catheterization  2013    occluded graft; Dr Lia Foyer  . Left heart catheterization with coronary angiogram N/A 08/03/2011    Procedure: LEFT HEART CATHETERIZATION WITH CORONARY ANGIOGRAM;  Surgeon: Hillary Bow, MD;  Location[facility] C[facility] LAB;  Service: Cardiovascular;  Laterality: N/A;  . Left heart catheterization with coronary/graft angiogram N/A 02/05/2014    Procedure: LEFT HEART CATHETERIZATION WITH Beatrix Fetters;  Surgeon: Troy Sine, MD;  Location: [facility] CATH LAB;  Service: Cardiovascular;  Laterality: N/A;    Patient Active Problem List   Diagnosis Date Noted  . Bilateral carotid bruits 02/28/2014  . Periodontal disease 02/13/2014  . CAD (coronary artery disease)   . Statin intolerance 07/20/2013  . Chronic systolic CHF (congestive heart failure) 08/31/2012  . Elevated CPK   . Abnormal LFTs   . Ejection fraction   . Hypokalemia 08/10/2011  . Renal insufficiency 08/03/2011  . Hypertension   . Aortic insufficiency   . Mitral regurgitation   . Pulmonary hypertension   . Hx of CABG   .  Dyslipidemia   . Hypothyroidism-TSH 9   . Bradycardia   . RBBB with LAFB   . Leg cramps   . Shingles   . Dizziness   . POSTHERPETIC NEURALGIA 04/29/2009  . VITAMIN D DEFICIENCY 02/17/2009  . ARTHRALGIA 02/12/2009  . UNSPECIFIED MYALGIA AND MYOSITIS 02/12/2009  . SHORTNESS OF BREATH 11/28/2008  . THROMBOCYTOPENIA 08/12/2008  . ADVERSE DRUG REACTION 07/16/2008  . Type II or unspecified type diabetes mellitus with peripheral circulatory disorders, uncontrolled(250.72) 01/16/2008  . GLAUCOMA, BORDERLINE 10/16/2007      Current Outpatient Prescriptions  Medication Sig Dispense Refill  . acetaminophen (TYLENOL) 325 MG tablet Take 650 mg by mouth every 6 (six) hours as needed for mild pain or moderate pain.     Marland Kitchen amLODipine (NORVASC) 10 MG tablet TAKE 1 TABLET BY MOUTH EVERY DAY 30 tablet 3  . aspirin EC 81 MG tablet Take 81 mg by mouth every morning.    .  benazepril (LOTENSIN) 40 MG tablet TAKE 1 TABLET (40 MG TOTAL) BY MOUTH DAILY. 30 tablet 3  . carvedilol (COREG) 3.125 MG tablet TAKE 1 TABLET (3.125 MG TOTAL) BY MOUTH EVERY MORNING. 30 tablet 3  . chlorhexidine (PERIDEX) 0.12 % solution Use as directed 15 mLs in the mouth or throat 2 (two) times daily.     . cholecalciferol (VITAMIN D) 1000 UNITS tablet Take 2,000 Units by mouth daily.    . clopidogrel (PLAVIX) 75 MG tablet Take 1 tablet (75 mg total) by mouth daily. 30 tablet 6  . dorzolamide (TRUSOPT) 2 % ophthalmic solution 1 drop both eyes in the evening    . furosemide (LASIX) 40 MG tablet Take 20 mg by mouth daily as needed for fluid.    Marland Kitchen glimepiride (AMARYL) 2 MG tablet Take 2 mg by mouth daily with breakfast.    . insulin lispro (HUMALOG KWIKPEN) 100 UNIT/ML KiwkPen As directed before meals 15 mL 1  . Insulin Pen Needle (ULTICARE MINI PEN NEEDLES) 31G X 6 MM MISC USE WITH LEVEMIR PEN, 12 UNITS DAILY IN THE EVENING ICD 10 E11 59 100 each 3  . isosorbide mononitrate (IMDUR) 30 MG 24 hr tablet TAKE 1 TABLET BY MOUTH EVERY DAY 30 tablet 3  . LEVEMIR FLEXTOUCH 100 UNIT/ML Pen INJECT 38 UNITS INTO THE SKIN AT BEDTIME. 3 pen 3  . levothyroxine (SYNTHROID, LEVOTHROID) 50 MCG tablet Take 1 tablet (50 mcg total) by mouth daily before breakfast. 30 tablet 11  . LUMIGAN 0.01 % SOLN   4  . LUMIGAN 0.03 % ophthalmic solution Place into both eyes at bedtime and may repeat dose one time if needed.     . nitroGLYCERIN (NITROSTAT) 0.4 MG SL tablet Place 0.4 mg under the tongue every 5 (five) minutes as needed for chest pain.    . NONFORMULARY OR COMPOUNDED ITEM Diabetic testing supply Check glucose once daily if possible Fasting or morning glucose recommended M, W, F, & Sun if possible Goal= 100-150 Glucose 2 hours after breakfast Tue, after lunch Th & 2 hrs after eve meal Sat if possible Goal = < 180 1 each 0  . rosuvastatin (CRESTOR) 10 MG tablet Take 10 mg by mouth 2 (two) times a week. Takes on  Sundays and Wednesdays    . [DISCONTINUED] pravastatin (PRAVACHOL) 20 MG tablet Take 20 mg by mouth daily.       No current facility-administered medications for this visit.    Allergies:   Atorvastatin; Folic acid; Nicardipine hcl; and Pravastatin  Social History:  The patient  reports that he quit smoking about 65 years ago. He has never used smokeless tobacco. He reports that he does not drink alcohol or use illicit drugs.   Family History:  The patient's family history includes Diabetes in his brother and sister; Heart attack (age of onset: 4) in his father; Hyperlipidemia in his mother; Lung cancer in his sister. There is no history of Stroke.    ROS:  Please see the history of present illness.     Patient denies fever, chills, headache, sweats, rash, change in vision, change in hearing, chest pain, cough, nausea or vomiting, urinary symptoms. All other systems are reviewed and are negative.   PHYSICAL EXAM: VS:  BP 138/60 mmHg  Pulse 60  Ht 5\' 9"  (1.753 m)  Wt 183 lb (83.008 kg)  BMI 27.01 kg/m2 , Patient is oriented to person time and place. Affect is normal. Head is atraumatic. Sclera and conjunctiva are normal. There is no jugular venous distention. He is here with his son. Lungs are clear. Respiratory effort is not labored. Cardiac exam reveals S1 and S2. Abdomen is soft. There is no peripheral edema. There are no musculoskeletal deformities. There are no skin rashes. Neurologic is grossly intact.  EKG:   EKG is not done today.  Recent Labs: 02/07/2014: Hemoglobin 12.5*; Platelets 134* 03/14/2014: ALT 15; BUN 21; Creatinine 1.4; Potassium 4.1; Sodium 141; TSH 3.60    Lipid Panel    Component Value Date/Time   CHOL 162 02/05/2014 0740   TRIG 176* 02/05/2014 0740   HDL 38* 02/05/2014 0740   CHOLHDL 4.3 02/05/2014 0740   VLDL 35 02/05/2014 0740   LDLCALC 89 02/05/2014 0740   LDLDIRECT 164.1 03/24/2012 1353      Wt Readings from Last 3 Encounters:  08/09/14 183  lb (83.008 kg)  05/03/14 181 lb (82.101 kg)  03/20/14 187 lb 12.8 oz (85.186 kg)      Current medicines are reviewed  The patient understands his medicines well.     ASSESSMENT AND PLAN:

## 2014-08-09 NOTE — Assessment & Plan Note (Signed)
Blood pressures controlled. No change in therapy. 

## 2014-08-09 NOTE — Patient Instructions (Signed)
Your physician recommends that you continue on your current medications as directed. Please refer to the Current Medication list given to you today. Your physician wants you to follow-up in: 4 months You will receive a reminder letter in the mail two months in advance. If you don't receive a letter, please call our office to schedule the follow-up appointment.  

## 2014-08-09 NOTE — Assessment & Plan Note (Signed)
His volume status is stable. No change in therapy. 

## 2014-08-10 LAB — HM DIABETES EYE EXAM

## 2014-08-15 ENCOUNTER — Encounter (HOSPITAL_COMMUNITY): Payer: Self-pay

## 2014-08-15 ENCOUNTER — Emergency Department (HOSPITAL_COMMUNITY)
Admission: EM | Admit: 2014-08-15 | Discharge: 2014-08-15 | Disposition: A | Discharge status: Home / Self Care | Payer: No Typology Code available for payment source | Attending: Emergency Medicine | Admitting: Emergency Medicine

## 2014-08-15 ENCOUNTER — Emergency Department (HOSPITAL_COMMUNITY): Payer: No Typology Code available for payment source

## 2014-08-15 ENCOUNTER — Telehealth: Payer: Self-pay | Admitting: *Deleted

## 2014-08-15 DIAGNOSIS — Y9389 Activity, other specified: Secondary | ICD-10-CM | POA: Diagnosis not present

## 2014-08-15 DIAGNOSIS — I252 Old myocardial infarction: Secondary | ICD-10-CM | POA: Diagnosis not present

## 2014-08-15 DIAGNOSIS — Z79899 Other long term (current) drug therapy: Secondary | ICD-10-CM | POA: Diagnosis not present

## 2014-08-15 DIAGNOSIS — S79921A Unspecified injury of right thigh, initial encounter: Secondary | ICD-10-CM | POA: Diagnosis not present

## 2014-08-15 DIAGNOSIS — S80211A Abrasion, right knee, initial encounter: Secondary | ICD-10-CM | POA: Diagnosis not present

## 2014-08-15 DIAGNOSIS — Z951 Presence of aortocoronary bypass graft: Secondary | ICD-10-CM | POA: Diagnosis not present

## 2014-08-15 DIAGNOSIS — E119 Type 2 diabetes mellitus without complications: Secondary | ICD-10-CM | POA: Insufficient documentation

## 2014-08-15 DIAGNOSIS — M79661 Pain in right lower leg: Secondary | ICD-10-CM | POA: Diagnosis not present

## 2014-08-15 DIAGNOSIS — Z23 Encounter for immunization: Secondary | ICD-10-CM | POA: Diagnosis not present

## 2014-08-15 DIAGNOSIS — I5020 Unspecified systolic (congestive) heart failure: Secondary | ICD-10-CM | POA: Diagnosis not present

## 2014-08-15 DIAGNOSIS — M25561 Pain in right knee: Secondary | ICD-10-CM | POA: Diagnosis not present

## 2014-08-15 DIAGNOSIS — Y998 Other external cause status: Secondary | ICD-10-CM | POA: Insufficient documentation

## 2014-08-15 DIAGNOSIS — Y92410 Unspecified street and highway as the place of occurrence of the external cause: Secondary | ICD-10-CM | POA: Insufficient documentation

## 2014-08-15 DIAGNOSIS — M7989 Other specified soft tissue disorders: Secondary | ICD-10-CM | POA: Diagnosis not present

## 2014-08-15 DIAGNOSIS — Z87448 Personal history of other diseases of urinary system: Secondary | ICD-10-CM | POA: Diagnosis not present

## 2014-08-15 DIAGNOSIS — Z7982 Long term (current) use of aspirin: Secondary | ICD-10-CM | POA: Insufficient documentation

## 2014-08-15 DIAGNOSIS — S8001XA Contusion of right knee, initial encounter: Secondary | ICD-10-CM | POA: Diagnosis not present

## 2014-08-15 DIAGNOSIS — Z8639 Personal history of other endocrine, nutritional and metabolic disease: Secondary | ICD-10-CM | POA: Insufficient documentation

## 2014-08-15 DIAGNOSIS — I10 Essential (primary) hypertension: Secondary | ICD-10-CM | POA: Insufficient documentation

## 2014-08-15 DIAGNOSIS — I2510 Atherosclerotic heart disease of native coronary artery without angina pectoris: Secondary | ICD-10-CM | POA: Diagnosis not present

## 2014-08-15 DIAGNOSIS — Z794 Long term (current) use of insulin: Secondary | ICD-10-CM | POA: Insufficient documentation

## 2014-08-15 DIAGNOSIS — S8991XA Unspecified injury of right lower leg, initial encounter: Secondary | ICD-10-CM | POA: Diagnosis not present

## 2014-08-15 DIAGNOSIS — T1490XA Injury, unspecified, initial encounter: Secondary | ICD-10-CM

## 2014-08-15 DIAGNOSIS — T148XXA Other injury of unspecified body region, initial encounter: Secondary | ICD-10-CM

## 2014-08-15 LAB — BASIC METABOLIC PANEL
Anion gap: 8 (ref 5–15)
BUN: 26 mg/dL — ABNORMAL HIGH (ref 6–23)
CO2: 22 mmol/L (ref 19–32)
Calcium: 8.9 mg/dL (ref 8.4–10.5)
Chloride: 107 mmol/L (ref 96–112)
Creatinine, Ser: 1.39 mg/dL — ABNORMAL HIGH (ref 0.50–1.35)
GFR calc Af Amer: 51 mL/min — ABNORMAL LOW (ref >90)
GFR calc non Af Amer: 44 mL/min — ABNORMAL LOW (ref >90)
GLUCOSE: 321 mg/dL — AB (ref 70–99)
Potassium: 4.1 mmol/L (ref 3.5–5.1)
SODIUM: 137 mmol/L (ref 135–145)

## 2014-08-15 LAB — CBC WITH DIFFERENTIAL/PLATELET
BASOS ABS: 0 10*3/uL (ref 0.0–0.1)
BASOS PCT: 0 % (ref 0–1)
Eosinophils Absolute: 0.1 10*3/uL (ref 0.0–0.7)
Eosinophils Relative: 1 % (ref 0–5)
HCT: 40.2 % (ref 39.0–52.0)
Hemoglobin: 13.4 g/dL (ref 13.0–17.0)
LYMPHS PCT: 19 % (ref 12–46)
Lymphs Abs: 1.6 10*3/uL (ref 0.7–4.0)
MCH: 30.7 pg (ref 26.0–34.0)
MCHC: 33.3 g/dL (ref 30.0–36.0)
MCV: 92.2 fL (ref 78.0–100.0)
MONO ABS: 0.7 10*3/uL (ref 0.1–1.0)
Monocytes Relative: 8 % (ref 3–12)
NEUTROS ABS: 6.1 10*3/uL (ref 1.7–7.7)
Neutrophils Relative %: 72 % (ref 43–77)
PLATELETS: 178 10*3/uL (ref 150–400)
RBC: 4.36 MIL/uL (ref 4.22–5.81)
RDW: 12.9 % (ref 11.5–15.5)
WBC: 8.6 10*3/uL (ref 4.0–10.5)

## 2014-08-15 LAB — PROTIME-INR
INR: 1.06 (ref 0.00–1.49)
Prothrombin Time: 13.9 seconds (ref 11.6–15.2)

## 2014-08-15 MED ORDER — MORPHINE SULFATE 2 MG/ML IJ SOLN
4.0000 mg | Freq: Once | INTRAMUSCULAR | Status: AC
  Administered 2014-08-15: 4 mg via INTRAVENOUS
  Filled 2014-08-15: qty 2

## 2014-08-15 MED ORDER — MORPHINE SULFATE 2 MG/ML IJ SOLN
INTRAMUSCULAR | Status: AC
  Filled 2014-08-15: qty 1

## 2014-08-15 MED ORDER — BACITRACIN ZINC 500 UNIT/GM EX OINT
1.0000 "application " | TOPICAL_OINTMENT | Freq: Two times a day (BID) | CUTANEOUS | Status: DC
  Administered 2014-08-15 (×2): 1 via TOPICAL
  Filled 2014-08-15: qty 0.9
  Filled 2014-08-15: qty 1.8

## 2014-08-15 MED ORDER — OXYCODONE-ACETAMINOPHEN 5-325 MG PO TABS: ORAL_TABLET | ORAL | Status: DC

## 2014-08-15 MED ORDER — MORPHINE SULFATE 2 MG/ML IJ SOLN
2.0000 mg | Freq: Once | INTRAMUSCULAR | Status: AC
  Administered 2014-08-15: 2 mg via INTRAVENOUS
  Filled 2014-08-15: qty 1

## 2014-08-15 MED ORDER — TETANUS-DIPHTH-ACELL PERTUSSIS 5-2.5-18.5 LF-MCG/0.5 IM SUSP
0.5000 mL | Freq: Once | INTRAMUSCULAR | Status: AC
  Administered 2014-08-15: 0.5 mL via INTRAMUSCULAR
  Filled 2014-08-15: qty 0.5
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
## 2014-08-15 NOTE — ED Notes (Signed)
Per dr. Request - wounds cleaned with sterile water and Bacitracin used. Vaseline gauge placed and knee ace wrapped.
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
## 2014-08-15 NOTE — ED Notes (Signed)
Pt ambulated from room to corner of nurses station with the assistance of two staff members. Pt tolerated fairly well. Stated his "neck" was hurting upon arrival to room. Pt placed in wheelchair in anticipation of going home. Pt stated he felt nauseated. Cool washcloth placed on pt's forehead for comfort. Son massaging pt's neck. Will continue to monitor. Abrasion to L. Hand cleaned, bacitracin applied, and abrasion covered.
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
## 2014-08-16 ENCOUNTER — Telehealth: Payer: Self-pay | Admitting: Cardiology
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
## 2014-08-19 ENCOUNTER — Ambulatory Visit (INDEPENDENT_AMBULATORY_CARE_PROVIDER_SITE_OTHER): Payer: Medicare Other | Admitting: Internal Medicine

## 2014-08-19 ENCOUNTER — Encounter: Payer: Self-pay | Admitting: Internal Medicine

## 2014-08-19 VITALS — BP 116/70 | HR 69 | Temp 97.7°F | Ht 69.0 in | Wt 177.5 lb

## 2014-08-19 DIAGNOSIS — S8991XS Unspecified injury of right lower leg, sequela: Secondary | ICD-10-CM

## 2014-08-19 DIAGNOSIS — I2510 Atherosclerotic heart disease of native coronary artery without angina pectoris: Secondary | ICD-10-CM | POA: Diagnosis not present

## 2014-08-19 DIAGNOSIS — S80811S Abrasion, right lower leg, sequela: Secondary | ICD-10-CM | POA: Diagnosis not present

## 2014-08-19 NOTE — Progress Notes (Signed)
Pre visit review using our clinic review tool, if applicable. No additional management support is needed unless otherwise documented below in the visit note. 

## 2014-08-19 NOTE — Patient Instructions (Addendum)
Dip gauze in  sterile saline and applied to the two denuded wounds twice a day. Cover the wound with Telfa , non stick dressing  without any antibiotic ointment. The saline can be purchased at the drugstore or you can make your own .Boil cup of salt in a gallon of water. Store mixture  in a clean container.Report Warning  signs as discussed (red streaks, pus, fever, increasing pain).Wear shorts to keep from irritating wounds.
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
## 2014-08-22 ENCOUNTER — Telehealth: Payer: Self-pay | Admitting: *Deleted

## 2014-08-22 DIAGNOSIS — S8991XS Unspecified injury of right lower leg, sequela: Secondary | ICD-10-CM

## 2014-08-22 DIAGNOSIS — S80811S Abrasion, right lower leg, sequela: Secondary | ICD-10-CM
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
## 2014-08-22 NOTE — Telephone Encounter (Signed)
Left msg on triage requesting home health order for physical therapy to help grandfather get around...[name]

## 2014-08-22 NOTE — Telephone Encounter (Signed)
Notified grand-daughter md on referral has been place will receive call once appt been set-up...[name]

## 2014-08-24 DIAGNOSIS — S80811D Abrasion, right lower leg, subsequent encounter: Secondary | ICD-10-CM | POA: Diagnosis not present

## 2014-08-24 DIAGNOSIS — S8991XD Unspecified injury of right lower leg, subsequent encounter: Secondary | ICD-10-CM | POA: Diagnosis not present

## 2014-08-26 DIAGNOSIS — S8991XD Unspecified injury of right lower leg, subsequent encounter: Secondary | ICD-10-CM | POA: Diagnosis not present

## 2014-08-26 DIAGNOSIS — S80811D Abrasion, right lower leg, subsequent encounter: Secondary | ICD-10-CM | POA: Diagnosis not present

## 2014-08-27 DIAGNOSIS — S80811D Abrasion, right lower leg, subsequent encounter: Secondary | ICD-10-CM | POA: Diagnosis not present

## 2014-08-27 DIAGNOSIS — S8991XD Unspecified injury of right lower leg, subsequent encounter: Secondary | ICD-10-CM | POA: Diagnosis not present

## 2014-08-28 ENCOUNTER — Telehealth: Payer: Self-pay | Admitting: Internal Medicine

## 2014-08-28 NOTE — Telephone Encounter (Signed)
Wynona Canes called back with a fax # of [phone]

## 2014-08-28 NOTE — Telephone Encounter (Signed)
Wynona Canes will call back with the fax # so I can fax Dr Hopper's response so she can show the patient.

## 2014-08-28 NOTE — Telephone Encounter (Signed)
Wynona Canes [phone] [facility]  Need to be clear on wound orders

## 2014-08-28 NOTE — Telephone Encounter (Signed)
Dip gauze in  sterile saline and applied to any denuded areas not to any scabbed areas wound twice a day. Cover the wound with Telfa , non stick dressing  without any antibiotic ointment. The saline can be purchased at the drugstore or you can make your own .Boil cup of salt in a gallon of water. Store mixture  in a clean container.Report Warning  signs as discussed (red streaks, pus, fever, increasing pain).

## 2014-08-29 DIAGNOSIS — S8991XD Unspecified injury of right lower leg, subsequent encounter: Secondary | ICD-10-CM | POA: Diagnosis not present

## 2014-08-29 DIAGNOSIS — S80811D Abrasion, right lower leg, subsequent encounter: Secondary | ICD-10-CM | POA: Diagnosis not present

## 2014-09-02 DIAGNOSIS — S8991XD Unspecified injury of right lower leg, subsequent encounter: Secondary | ICD-10-CM | POA: Diagnosis not present

## 2014-09-02 DIAGNOSIS — S80811D Abrasion, right lower leg, subsequent encounter: Secondary | ICD-10-CM | POA: Diagnosis not present

## 2014-09-03 DIAGNOSIS — S8991XD Unspecified injury of right lower leg, subsequent encounter: Secondary | ICD-10-CM | POA: Diagnosis not present

## 2014-09-03 DIAGNOSIS — S80811D Abrasion, right lower leg, subsequent encounter: Secondary | ICD-10-CM | POA: Diagnosis not present

## 2014-09-04 DIAGNOSIS — S8991XD Unspecified injury of right lower leg, subsequent encounter: Secondary | ICD-10-CM | POA: Diagnosis not present

## 2014-09-06 ENCOUNTER — Other Ambulatory Visit: Payer: Self-pay | Admitting: Internal Medicine

## 2014-09-06 DIAGNOSIS — S80811D Abrasion, right lower leg, subsequent encounter: Secondary | ICD-10-CM | POA: Diagnosis not present

## 2014-09-06 DIAGNOSIS — S8991XD Unspecified injury of right lower leg, subsequent encounter: Secondary | ICD-10-CM | POA: Diagnosis not present

## 2014-09-06 DIAGNOSIS — IMO0002 Reserved for concepts with insufficient information to code with codable children: Secondary | ICD-10-CM

## 2014-09-06 DIAGNOSIS — E1351 Other specified diabetes mellitus with diabetic peripheral angiopathy without gangrene: Secondary | ICD-10-CM

## 2014-09-06 DIAGNOSIS — E1365 Other specified diabetes mellitus with hyperglycemia: Principal | ICD-10-CM

## 2014-09-11 ENCOUNTER — Telehealth: Payer: Self-pay | Admitting: Internal Medicine

## 2014-09-11 ENCOUNTER — Other Ambulatory Visit: Payer: Self-pay | Admitting: Internal Medicine

## 2014-09-11 DIAGNOSIS — S8991XD Unspecified injury of right lower leg, subsequent encounter: Secondary | ICD-10-CM

## 2014-09-11 DIAGNOSIS — M25461 Effusion, right knee: Secondary | ICD-10-CM

## 2014-09-11 DIAGNOSIS — S80811D Abrasion, right lower leg, subsequent encounter: Secondary | ICD-10-CM | POA: Diagnosis not present
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
## 2014-09-16 DIAGNOSIS — S80811D Abrasion, right lower leg, subsequent encounter: Secondary | ICD-10-CM | POA: Diagnosis not present

## 2014-09-16 DIAGNOSIS — S8991XD Unspecified injury of right lower leg, subsequent encounter: Secondary | ICD-10-CM | POA: Diagnosis not present

## 2014-09-18 DIAGNOSIS — S8991XD Unspecified injury of right lower leg, subsequent encounter: Secondary | ICD-10-CM | POA: Diagnosis not present

## 2014-09-18 DIAGNOSIS — S80811D Abrasion, right lower leg, subsequent encounter: Secondary | ICD-10-CM | POA: Diagnosis not present

## 2014-09-19 DIAGNOSIS — M25561 Pain in right knee: Secondary | ICD-10-CM | POA: Diagnosis not present

## 2014-09-25 ENCOUNTER — Other Ambulatory Visit: Payer: Self-pay | Admitting: Cardiology

## 2014-09-25 DIAGNOSIS — S8991XD Unspecified injury of right lower leg, subsequent encounter: Secondary | ICD-10-CM | POA: Diagnosis not present

## 2014-09-25 DIAGNOSIS — S80811D Abrasion, right lower leg, subsequent encounter: Secondary | ICD-10-CM | POA: Diagnosis not present

## 2014-10-06 ENCOUNTER — Other Ambulatory Visit: Payer: Self-pay | Admitting: Cardiology

## 2014-10-12 ENCOUNTER — Emergency Department (HOSPITAL_COMMUNITY): Payer: Medicare Other

## 2014-10-12 ENCOUNTER — Emergency Department (HOSPITAL_COMMUNITY)
Admission: EM | Admit: 2014-10-12 | Discharge: 2014-10-12 | Disposition: A | Discharge status: Home / Self Care | Payer: Medicare Other | Attending: Emergency Medicine | Admitting: Emergency Medicine

## 2014-10-12 ENCOUNTER — Encounter (HOSPITAL_COMMUNITY): Payer: Self-pay | Admitting: Emergency Medicine

## 2014-10-12 DIAGNOSIS — Z8619 Personal history of other infectious and parasitic diseases: Secondary | ICD-10-CM | POA: Diagnosis not present

## 2014-10-12 DIAGNOSIS — Z7902 Long term (current) use of antithrombotics/antiplatelets: Secondary | ICD-10-CM | POA: Diagnosis not present

## 2014-10-12 DIAGNOSIS — I10 Essential (primary) hypertension: Secondary | ICD-10-CM | POA: Diagnosis not present

## 2014-10-12 DIAGNOSIS — I252 Old myocardial infarction: Secondary | ICD-10-CM | POA: Insufficient documentation

## 2014-10-12 DIAGNOSIS — Z7982 Long term (current) use of aspirin: Secondary | ICD-10-CM | POA: Insufficient documentation

## 2014-10-12 DIAGNOSIS — Z87891 Personal history of nicotine dependence: Secondary | ICD-10-CM | POA: Diagnosis not present

## 2014-10-12 DIAGNOSIS — Z951 Presence of aortocoronary bypass graft: Secondary | ICD-10-CM | POA: Insufficient documentation

## 2014-10-12 DIAGNOSIS — R42 Dizziness and giddiness: Secondary | ICD-10-CM | POA: Diagnosis not present

## 2014-10-12 DIAGNOSIS — Z79899 Other long term (current) drug therapy: Secondary | ICD-10-CM | POA: Diagnosis not present

## 2014-10-12 DIAGNOSIS — Z794 Long term (current) use of insulin: Secondary | ICD-10-CM | POA: Diagnosis not present

## 2014-10-12 DIAGNOSIS — E039 Hypothyroidism, unspecified: Secondary | ICD-10-CM | POA: Insufficient documentation

## 2014-10-12 DIAGNOSIS — R531 Weakness: Secondary | ICD-10-CM | POA: Diagnosis present

## 2014-10-12 DIAGNOSIS — E785 Hyperlipidemia, unspecified: Secondary | ICD-10-CM | POA: Insufficient documentation

## 2014-10-12 DIAGNOSIS — I2510 Atherosclerotic heart disease of native coronary artery without angina pectoris: Secondary | ICD-10-CM | POA: Insufficient documentation

## 2014-10-12 DIAGNOSIS — I5020 Unspecified systolic (congestive) heart failure: Secondary | ICD-10-CM | POA: Insufficient documentation

## 2014-10-12 DIAGNOSIS — E119 Type 2 diabetes mellitus without complications: Secondary | ICD-10-CM | POA: Insufficient documentation

## 2014-10-12 DIAGNOSIS — Z8673 Personal history of transient ischemic attack (TIA), and cerebral infarction without residual deficits: Secondary | ICD-10-CM | POA: Insufficient documentation

## 2014-10-12 LAB — CBC WITH DIFFERENTIAL/PLATELET
BASOS PCT: 0 % (ref 0–1)
Basophils Absolute: 0 10*3/uL (ref 0.0–0.1)
EOS ABS: 0.2 10*3/uL (ref 0.0–0.7)
Eosinophils Relative: 3 % (ref 0–5)
HCT: 37.4 % — ABNORMAL LOW (ref 39.0–52.0)
HEMOGLOBIN: 12.4 g/dL — AB (ref 13.0–17.0)
LYMPHS PCT: 27 % (ref 12–46)
Lymphs Abs: 1.8 10*3/uL (ref 0.7–4.0)
MCH: 30.5 pg (ref 26.0–34.0)
MCHC: 33.2 g/dL (ref 30.0–36.0)
MCV: 92.1 fL (ref 78.0–100.0)
MONO ABS: 0.5 10*3/uL (ref 0.1–1.0)
MONOS PCT: 8 % (ref 3–12)
NEUTROS ABS: 4.2 10*3/uL (ref 1.7–7.7)
NEUTROS PCT: 62 % (ref 43–77)
Platelets: 185 10*3/uL (ref 150–400)
RBC: 4.06 MIL/uL — AB (ref 4.22–5.81)
RDW: 13.5 % (ref 11.5–15.5)
WBC: 6.7 10*3/uL (ref 4.0–10.5)

## 2014-10-12 LAB — COMPREHENSIVE METABOLIC PANEL
ALT: 15 U/L — ABNORMAL LOW (ref 17–63)
ANION GAP: 10 (ref 5–15)
AST: 16 U/L (ref 15–41)
Albumin: 3.9 g/dL (ref 3.5–5.0)
Alkaline Phosphatase: 46 U/L (ref 38–126)
BUN: 27 mg/dL — ABNORMAL HIGH (ref 6–20)
CHLORIDE: 105 mmol/L (ref 101–111)
CO2: 23 mmol/L (ref 22–32)
Calcium: 8.9 mg/dL (ref 8.9–10.3)
Creatinine, Ser: 1.42 mg/dL — ABNORMAL HIGH (ref 0.61–1.24)
GFR calc Af Amer: 50 mL/min — ABNORMAL LOW (ref >60)
GFR calc non Af Amer: 43 mL/min — ABNORMAL LOW (ref >60)
Glucose, Bld: 286 mg/dL — ABNORMAL HIGH (ref 65–99)
Potassium: 4.1 mmol/L (ref 3.5–5.1)
SODIUM: 138 mmol/L (ref 135–145)
TOTAL PROTEIN: 6.7 g/dL (ref 6.5–8.1)
Total Bilirubin: 0.7 mg/dL (ref 0.3–1.2)

## 2014-10-12 LAB — TROPONIN I

## 2014-10-12 LAB — LACTIC ACID, PLASMA: LACTIC ACID, VENOUS: 1.2 mmol/L (ref 0.5–2.0)

## 2014-10-12 NOTE — ED Notes (Addendum)
Per patient woke this morning feeling dizzy, had wife check blood pressure which was 100/60. Patient unsure what time he woke this morning. Denies ay headache, chest pain, or neurological deficits. Per patient did have some dizziness yesterday but today is worse.

## 2014-10-12 NOTE — Discharge Instructions (Signed)
Rest at home this weekend,   Follow up with your md next week.

## 2014-10-12 NOTE — ED Provider Notes (Signed)
CSN: [id number]     Arrival date & time 10/12/14  1229 History   First MD Initiated Contact with Patient 10/12/14 1341     Chief Complaint  Patient presents with  . Hypotension     (Consider location/radiation/quality/duration/timing/severity/associated sxs/prior Treatment) Patient is a 81 y.o. male presenting with weakness. The history is provided by the patient (pt states he has had some dizziness and weakness).  Weakness This is a new problem. The current episode started 3 to 5 hours ago. The problem occurs constantly. The problem has not changed since onset.Pertinent negatives include no chest pain, no abdominal pain and no headaches. Nothing aggravates the symptoms. Nothing relieves the symptoms.    Past Medical History  Diagnosis Date  . CAD (coronary artery disease)   . Hypertension   . Ejection fraction     45%, echo, March, 2012,   /  Ejection fraction after his MI March, 2013 is 45%. There is posterior basal akinesis with abnormal septal motion.  . Aortic insufficiency     Moderate, echo, March, 2012  . Mitral regurgitation     Moderate, echo, March, 2012  . Pulmonary hypertension     Echo, March, 2012, 42 mmHg  . Hx of CABG   . Diabetes mellitus   . Dyslipidemia   . Hypothyroidism   . Bradycardia     Sinus bradycardia  . RBBB (right bundle branch block with left anterior fascicular block)     New July, 2010  . Leg cramps     Nighttime  . Shingles     Severe pain to right flank treated  . Ischemia     .Marland KitchenMarland KitchenEF 42%  . Hyperlipidemia   . Dizziness      12/2009 &12/ 2012  . Abnormal LFTs     LFTs were abnormal with acute MI March, 2013, improving at the time of discharge  . Myocardial infarction 3/19-27/2013    ARF, elevated LFTs  . Systolic CHF   . Elevated CPK     2013, now in 2014 patient on very low dose Crestor   Past Surgical History  Procedure Laterality Date  . Coronary artery bypass graft  1996    5 vessels  . Back surgery  1999    LS sx- 1999  .  Spinal fusion  2000    scar tissue, plate insertion/fusion @ [facility] spine - 2000  . Cardiac surgery    . Cardiac catheterization  2013    occluded graft; Dr Lia Foyer  . Left heart catheterization with coronary angiogram N/A 08/03/2011    Procedure: LEFT HEART CATHETERIZATION WITH CORONARY ANGIOGRAM;  Surgeon: Hillary Bow, MD;  Location: [facility] CATH LAB;  Service: Cardiovascular;  Laterality: N/A;  . Left heart catheterization with coronary/graft angiogram N/A 02/05/2014    Procedure: LEFT HEART CATHETERIZATION WITH Beatrix Fetters;  Surgeon: Troy Sine, MD;  Location: [facility] CATH LAB;  Service: Cardiovascular;  Laterality: N/A;   Family History  Problem Relation Age of Onset  . Diabetes Sister   . Heart attack Father 56  . Lung cancer Sister   . Stroke Neg Hx   . Diabetes Brother   . Hyperlipidemia Mother    History  Substance Use Topics  . Smoking status: Former Smoker -- 1.00 packs/day for 5 years    Quit date: 05/17/1949  . Smokeless tobacco: Never Used     Comment: Quit at age 41  . Alcohol Use: No    Review of Systems  Constitutional: Negative for  appetite change and fatigue.  HENT: Negative for congestion, ear discharge and sinus pressure.   Eyes: Negative for discharge.  Respiratory: Negative for cough.   Cardiovascular: Negative for chest pain.  Gastrointestinal: Negative for abdominal pain and diarrhea.  Genitourinary: Negative for frequency and hematuria.  Musculoskeletal: Negative for back pain.  Skin: Negative for rash.  Neurological: Positive for weakness. Negative for seizures and headaches.  Psychiatric/Behavioral: Negative for hallucinations.      Allergies  Atorvastatin; Folic acid; Nicardipine hcl; and Pravastatin  Home Medications   Prior to Admission medications   Medication Sig Start Date End Date Taking? Authorizing Provider  amLODipine (NORVASC) 10 MG tablet TAKE 1 TABLET BY MOUTH EVERY DAY 07/16/14  Yes Carlena Bjornstad, MD  aspirin EC 81 MG  tablet Take 81 mg by mouth every morning.   Yes Historical Provider, MD  benazepril (LOTENSIN) 40 MG tablet TAKE 1 TABLET BY MOUTH EVERY DAY 10/07/14  Yes Carlena Bjornstad, MD  carvedilol (COREG) 3.125 MG tablet TAKE 1 TABLET BY MOUTH EVERY MORNING 10/07/14  Yes Carlena Bjornstad, MD  cholecalciferol (VITAMIN D) 1000 UNITS tablet Take 1,000 Units by mouth daily.    Yes Historical Provider, MD  clopidogrel (PLAVIX) 75 MG tablet Take 1 tablet (75 mg total) by mouth daily. 04/05/14  Yes Carlena Bjornstad, MD  dorzolamide (TRUSOPT) 2 % ophthalmic solution Place 1 drop into both eyes daily.  03/14/11  Yes Historical Provider, MD  isosorbide mononitrate (IMDUR) 30 MG 24 hr tablet TAKE 1 TABLET BY MOUTH EVERY DAY 09/25/14  Yes Carlena Bjornstad, MD  LEVEMIR FLEXTOUCH 100 UNIT/ML Pen INJECT 38 UNITS INTO THE SKIN AT BEDTIME. Patient taking differently: INJECT 41 UNITS INTO THE SKIN AT BEDTIME. 07/19/14  Yes Hendricks Limes, MD  levothyroxine (SYNTHROID, LEVOTHROID) 50 MCG tablet Take 1 tablet (50 mcg total) by mouth daily before breakfast. 02/07/14  Yes Luke K Kilroy, PA-C  LUMIGAN 0.03 % ophthalmic solution Place into both eyes at bedtime and may repeat dose one time if needed.  02/26/11  Yes Historical Provider, MD  acetaminophen (TYLENOL) 325 MG tablet Take 650 mg by mouth every 6 (six) hours as needed for mild pain or moderate pain.     Historical Provider, MD  furosemide (LASIX) 40 MG tablet Take 20 mg by mouth daily as needed for fluid.    Historical Provider, MD  insulin lispro (HUMALOG KWIKPEN) 100 UNIT/ML KiwkPen As directed before meals Patient taking differently: Inject 10 Units into the skin 3 (three) times daily as needed (blood sugar). As directed before meals 02/28/14   Elayne Snare, MD  Insulin Pen Needle (ULTICARE MINI PEN NEEDLES) 31G X 6 MM MISC USE WITH LEVEMIR PEN, 12 UNITS DAILY IN THE EVENING ICD 10 E11 59 06/19/14   Hendricks Limes, MD  nitroGLYCERIN (NITROSTAT) 0.4 MG SL tablet Place 0.4 mg under the  tongue every 5 (five) minutes as needed for chest pain.    Historical Provider, MD  NONFORMULARY OR COMPOUNDED ITEM Diabetic testing supply Check glucose once daily if possible Fasting or morning glucose recommended M, W, F, & Sun if possible Goal= 100-150 Glucose 2 hours after breakfast Tue, after lunch Th & 2 hrs after eve meal Sat if possible Goal = < 180 07/13/12   Hendricks Limes, MD  oxyCODONE-acetaminophen (PERCOCET/ROXICET) 5-325 MG per tablet Take 1/2-1 tablet every 4 hours as needed for pain.  Do not combine with additional Tylenol. Patient not taking: Reported on 10/12/2014 08/15/14  Merryl Hacker, MD  rosuvastatin (CRESTOR) 10 MG tablet Take 10 mg by mouth 2 (two) times a week. Takes on Sundays and Wednesdays    Historical Provider, MD   BP 142/70 mmHg  Pulse 58  Temp(Src) 98.6 F (37 C) (Oral)  Resp 14  Ht '5\' 9"'$  (1.753 m)  Wt 180 lb (81.647 kg)  BMI 26.57 kg/m2  SpO2 99% Physical Exam  Constitutional: He is oriented to person, place, and time. He appears well-developed.  HENT:  Head: Normocephalic.  Eyes: Conjunctivae and EOM are normal. No scleral icterus.  Neck: Neck supple. No thyromegaly present.  Cardiovascular: Normal rate and regular rhythm.  Exam reveals no gallop and no friction rub.   No murmur heard. Pulmonary/Chest: No stridor. He has no wheezes. He has no rales. He exhibits no tenderness.  Abdominal: He exhibits no distension. There is no tenderness. There is no rebound.  Musculoskeletal: Normal range of motion. He exhibits no edema.  Lymphadenopathy:    He has no cervical adenopathy.  Neurological: He is oriented to person, place, and time. He exhibits normal muscle tone. Coordination normal.  Skin: No rash noted. No erythema.  Psychiatric: He has a normal mood and affect. His behavior is normal.    ED Course  Procedures (including critical care time) Labs Review Labs Reviewed  CBC WITH DIFFERENTIAL/PLATELET - Abnormal; Notable for the following:     RBC 4.06 (*)    Hemoglobin 12.4 (*)    HCT 37.4 (*)    All other components within normal limits  COMPREHENSIVE METABOLIC PANEL - Abnormal; Notable for the following:    Glucose, Bld 286 (*)    BUN 27 (*)    Creatinine, Ser 1.42 (*)    ALT 15 (*)    GFR calc non Af Amer 43 (*)    GFR calc Af Amer 50 (*)    All other components within normal limits  TROPONIN I  LACTIC ACID, PLASMA    Imaging Review Dg Chest 2 View  10/12/2014   CLINICAL DATA:  Dizziness  EXAM: CHEST  2 VIEW  COMPARISON:  02/04/2014  FINDINGS: Cardiac shadow is stable. Postoperative changes are again seen. Mild aortic calcifications are noted. The lungs are well-aerated without focal infiltrate or effusion. Degenerative changes of the thoracic spine are seen.  IMPRESSION: No active cardiopulmonary disease.   Electronically Signed   By: Inez Catalina M.D.   On: 10/12/2014 14:33   Ct Head Wo Contrast  10/12/2014   CLINICAL DATA:  Dizziness, hypotension  EXAM: CT HEAD WITHOUT CONTRAST  TECHNIQUE: Contiguous axial images were obtained from the base of the skull through the vertex without intravenous contrast.  COMPARISON:  None.  FINDINGS: No skull fracture is noted. Atherosclerotic calcifications of carotid siphon. Atherosclerotic calcifications of vertebral arteries.  No intracranial hemorrhage, mass effect or midline shift. No acute cortical infarction. There is old appearing infarct in right posterior parietal lobe and right occipital lobe. No mass lesion is noted on this unenhanced scan. Mild cerebral atrophy. Mild periventricular white matter decreased attenuation probable due to chronic small vessel ischemic changes.  IMPRESSION: No intracranial hemorrhage, mass effect or midline shift. No acute cortical infarction. There is old appearing infarct in right posterior parietal lobe and right occipital lobe. No mass lesion is noted on this unenhanced scan.   Electronically Signed   By: Lahoma Crocker M.D.   On: 10/12/2014 14:03      EKG Interpretation None      MDM  Final diagnoses:  Dizziness    Dizziness,  Weakness,  Nl studies.  Pt improving,  He will follow up with his md    Milton Ferguson, MD 10/12/14 [phone]

## 2014-10-16 ENCOUNTER — Telehealth: Payer: Self-pay | Admitting: *Deleted
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
## 2014-11-21 ENCOUNTER — Other Ambulatory Visit (INDEPENDENT_AMBULATORY_CARE_PROVIDER_SITE_OTHER): Payer: Medicare Other | Admitting: *Deleted

## 2014-11-21 ENCOUNTER — Ambulatory Visit (INDEPENDENT_AMBULATORY_CARE_PROVIDER_SITE_OTHER): Payer: Medicare Other | Admitting: Internal Medicine

## 2014-11-21 ENCOUNTER — Encounter: Payer: Self-pay | Admitting: Internal Medicine

## 2014-11-21 VITALS — BP 122/58 | HR 65 | Temp 97.8°F | Resp 12 | Ht 69.0 in | Wt 181.2 lb

## 2014-11-21 DIAGNOSIS — E1351 Other specified diabetes mellitus with diabetic peripheral angiopathy without gangrene: Secondary | ICD-10-CM

## 2014-11-21 DIAGNOSIS — I2510 Atherosclerotic heart disease of native coronary artery without angina pectoris: Secondary | ICD-10-CM

## 2014-11-21 DIAGNOSIS — IMO0002 Reserved for concepts with insufficient information to code with codable children: Secondary | ICD-10-CM

## 2014-11-21 DIAGNOSIS — E1151 Type 2 diabetes mellitus with diabetic peripheral angiopathy without gangrene: Secondary | ICD-10-CM

## 2014-11-21 DIAGNOSIS — E1159 Type 2 diabetes mellitus with other circulatory complications: Secondary | ICD-10-CM

## 2014-11-21 DIAGNOSIS — E1165 Type 2 diabetes mellitus with hyperglycemia: Principal | ICD-10-CM

## 2014-11-21 DIAGNOSIS — E1365 Other specified diabetes mellitus with hyperglycemia: Principal | ICD-10-CM

## 2014-11-21 LAB — POCT GLYCOSYLATED HEMOGLOBIN (HGB A1C): Hemoglobin A1C: 8.5

## 2014-11-21 MED ORDER — INSULIN PEN NEEDLE 32G X 4 MM MISC: Status: DC

## 2014-11-21 MED ORDER — SAXAGLIPTIN HCL 5 MG PO TABS: 5.0000 mg | ORAL_TABLET | Freq: Every day | ORAL | Status: DC

## 2014-11-21 MED ORDER — INSULIN DETEMIR 100 UNIT/ML FLEXPEN: PEN_INJECTOR | SUBCUTANEOUS | Status: DC

## 2014-11-21 NOTE — Progress Notes (Signed)
Patient ID: [name], male   DOB: Mar 15, 1926, 81 y.o.   MRN: [id number]  HPI: [name] is a 81 y.o.-year-old male, referred by his PCP, Dr. Linna Darner, for management of DM2, dx in 2008, insulin-dependent since 2015, uncontrolled, with complications (CAD, s/p MI and CABG in 1994, also s/p stent; CKD).  Last hemoglobin A1c was: Lab Results  Component Value Date   HGBA1C 9.0* 02/12/2014   HGBA1C 9.4* 06/28/2013   HGBA1C 9.7* 11/14/2012   Pt is on a regimen of: - Levemir 40 units at bedtime He has Novolog 5 units 3x a day in his chart, but not taking.  Pt checks his sugars 1x a day and they are: - am: 138-141 (200, 245 - last 2 days as he increase his intake of sweets) - 2h after b'fast: n/c - before lunch: n/c - 2h after lunch: n/c - before dinner: n/c - 2h after dinner: n/c - bedtime: n/c - nighttime: n/c No lows. Lowest sugar was 97; he has hypoglycemia awareness at 80.  Highest sugar was 295 x1.  Glucometer: ?  Pt's meals are: - Breakfast: 1 egg + [place] sausage + toast; occasionally 2 eggs - Lunch: sub or meat + veggies - Dinner: salad/vegetables + stake, sometimes starch - usually eats out or [employer] - Snacks: 2x a day: icecream  He plays golf, but not lately  - + CKD, last BUN/creatinine:  Lab Results  Component Value Date   BUN 27* 10/12/2014   CREATININE 1.42* 10/12/2014  On Benazepril. - last set of lipids: Lab Results  Component Value Date   CHOL 162 02/05/2014   HDL 38* 02/05/2014   LDLCALC 89 02/05/2014   LDLDIRECT 164.1 03/24/2012   TRIG 176* 02/05/2014   CHOLHDL 4.3 02/05/2014  On Crestor 10. - last eye exam was in 08/10/2014. Dr Bing Plume. No DR. Will have cataract sx.  - no numbness and tingling in his feet.  Pt has FH of DM in sisters and brother.  ROS: Constitutional: no weight gain/loss, no fatigue, no subjective hyperthermia/hypothermia,+  nocturia Eyes: no blurry vision, no xerophthalmia ENT: no sore throat, no nodules palpated  in throat, no dysphagia/odynophagia, no hoarseness Cardiovascular: no CP/SOB/palpitations/leg swelling Respiratory: no cough/SOB Gastrointestinal: no N/V/D/C Musculoskeletal: no muscle/joint aches Skin: no rashes, + easy bruising Neurological: no tremors/numbness/tingling/dizziness Psychiatric: no depression/anxiety  Past Medical History  Diagnosis Date  . CAD (coronary artery disease)   . Hypertension   . Ejection fraction     45%, echo, March, 2012,   /  Ejection fraction after his MI March, 2013 is 45%. There is posterior basal akinesis with abnormal septal motion.  . Aortic insufficiency     Moderate, echo, March, 2012  . Mitral regurgitation     Moderate, echo, March, 2012  . Pulmonary hypertension     Echo, March, 2012, 42 mmHg  . Hx of CABG   . Diabetes mellitus   . Dyslipidemia   . Hypothyroidism   . Bradycardia     Sinus bradycardia  . RBBB (right bundle branch block with left anterior fascicular block)     New July, 2010  . Leg cramps     Nighttime  . Shingles     Severe pain to right flank treated  . Ischemia     .Marland KitchenMarland KitchenEF 42%  . Hyperlipidemia   . Dizziness      12/2009 &12/ 2012  . Abnormal LFTs     LFTs were abnormal with acute MI March, 2013, improving at  the time of discharge  . Myocardial infarction 3/19-27/2013    ARF, elevated LFTs  . Systolic CHF   . Elevated CPK     2013, now in 2014 patient on very low dose Crestor   Past Surgical History  Procedure Laterality Date  . Coronary artery bypass graft  1996    5 vessels  . Back surgery  1999    LS sx- 1999  . Spinal fusion  2000    scar tissue, plate insertion/fusion @ [facility] spine - 2000  . Cardiac surgery    . Cardiac catheterization  2013    occluded graft; Dr Lia Foyer  . Left heart catheterization with coronary angiogram N/A 08/03/2011    Procedure: LEFT HEART CATHETERIZATION WITH CORONARY ANGIOGRAM;  Surgeon: Hillary Bow, MD;  Location: [facility] CATH LAB;  Service: Cardiovascular;  Laterality: N/A;   . Left heart catheterization with coronary/graft angiogram N/A 02/05/2014    Procedure: LEFT HEART CATHETERIZATION WITH Beatrix Fetters;  Surgeon: Troy Sine, MD;  Location: [facility] CATH LAB;  Service: Cardiovascular;  Laterality: N/A;   History   Social History  . Marital Status: Married    Spouse Name: N/A  . Children: yes   Occupational History  .  [employer]    Social History Main Topics  . Smoking status: Former Smoker -- 1.00 packs/day for 5 years    Quit date: 05/17/1949  . Smokeless tobacco: Never Used     Comment: Quit at age 72  . Alcohol Use: No  . Drug Use: No   Current Outpatient Prescriptions on File Prior to Visit  Medication Sig Dispense Refill  . acetaminophen (TYLENOL) 325 MG tablet Take 650 mg by mouth every 6 (six) hours as needed for mild pain or moderate pain.     Marland Kitchen amLODipine (NORVASC) 10 MG tablet TAKE 1 TABLET BY MOUTH EVERY DAY 30 tablet 3  . aspirin EC 81 MG tablet Take 81 mg by mouth every morning.    . benazepril (LOTENSIN) 40 MG tablet TAKE 1 TABLET BY MOUTH EVERY DAY 30 tablet 1  . carvedilol (COREG) 3.125 MG tablet TAKE 1 TABLET BY MOUTH EVERY MORNING 30 tablet 1  . cholecalciferol (VITAMIN D) 1000 UNITS tablet Take 1,000 Units by mouth daily.     . clopidogrel (PLAVIX) 75 MG tablet Take 1 tablet (75 mg total) by mouth daily. 30 tablet 6  . dorzolamide (TRUSOPT) 2 % ophthalmic solution Place 1 drop into both eyes daily.     . furosemide (LASIX) 40 MG tablet Take 20 mg by mouth daily as needed for fluid.    Marland Kitchen insulin lispro (HUMALOG KWIKPEN) 100 UNIT/ML KiwkPen As directed before meals (Patient taking differently: Inject 10 Units into the skin 3 (three) times daily as needed (blood sugar). As directed before meals) 15 mL 1  . Insulin Pen Needle (ULTICARE MINI PEN NEEDLES) 31G X 6 MM MISC USE WITH LEVEMIR PEN, 12 UNITS DAILY IN THE EVENING ICD 10 E11 59 100 each 3  . isosorbide mononitrate (IMDUR) 30 MG 24 hr tablet TAKE 1 TABLET BY MOUTH EVERY DAY  30 tablet 3  . LEVEMIR FLEXTOUCH 100 UNIT/ML Pen INJECT 38 UNITS INTO THE SKIN AT BEDTIME. (Patient taking differently: INJECT 41 UNITS INTO THE SKIN AT BEDTIME.) 3 pen 3  . levothyroxine (SYNTHROID, LEVOTHROID) 50 MCG tablet Take 1 tablet (50 mcg total) by mouth daily before breakfast. 30 tablet 11  . LUMIGAN 0.03 % ophthalmic solution Place into both eyes at bedtime and  may repeat dose one time if needed.     . nitroGLYCERIN (NITROSTAT) 0.4 MG SL tablet Place 0.4 mg under the tongue every 5 (five) minutes as needed for chest pain.    . NONFORMULARY OR COMPOUNDED ITEM Diabetic testing supply Check glucose once daily if possible Fasting or morning glucose recommended M, W, F, & Sun if possible Goal= 100-150 Glucose 2 hours after breakfast Tue, after lunch Th & 2 hrs after eve meal Sat if possible Goal = < 180 1 each 0  . oxyCODONE-acetaminophen (PERCOCET/ROXICET) 5-325 MG per tablet Take 1/2-1 tablet every 4 hours as needed for pain.  Do not combine with additional Tylenol. 20 tablet 0  . rosuvastatin (CRESTOR) 10 MG tablet Take 10 mg by mouth 2 (two) times a week. Takes on Sundays and Wednesdays    . [DISCONTINUED] pravastatin (PRAVACHOL) 20 MG tablet Take 20 mg by mouth daily.       No current facility-administered medications on file prior to visit.   Allergies  Allergen Reactions  . Atorvastatin     REACTION: myalgias  . Folic Acid     REACTION: Face burns  . Nicardipine Hcl     REACTION: Gingival hypertrophy  . Pravastatin     myalgias   Family History  Problem Relation Age of Onset  . Diabetes Sister   . Heart attack Father 108  . Lung cancer Sister   . Stroke Neg Hx   . Diabetes Brother   . Hyperlipidemia Mother    PE: BP 122/58 mmHg  Pulse 65  Temp(Src) 97.8 F (36.6 C) (Oral)  Resp 12  Ht '5\' 9"'$  (1.753 m)  Wt 181 lb 3.2 oz (82.192 kg)  BMI 26.75 kg/m2  SpO2 97% Wt Readings from Last 3 Encounters:  11/21/14 181 lb 3.2 oz (82.192 kg)  10/12/14 180 lb (81.647 kg)   08/19/14 177 lb 8 oz (80.513 kg)   Constitutional: normal weight, in NAD Eyes: PERRLA, EOMI, no exophthalmos ENT: moist mucous membranes, no thyromegaly, no cervical lymphadenopathy Cardiovascular: RRR, No MRG Respiratory: CTA B Gastrointestinal: abdomen soft, NT, ND, BS+ Musculoskeletal: no deformities, strength intact in all 4 Skin: moist, warm, no rashes Neurological: no tremor with outstretched hands, DTR normal in all 4  ASSESSMENT: 1. DM2, insulin-dependent, uncontrolled, with complications - chronic kidney disease  - CAD, s/p MI, s/p CABG, s/p stent   PLAN:  1. Patient with long-standing, uncontrolled diabetes, on a basal insulin regimen, which became insufficient. His hemoglobin A1c continues to decrease. We discussed with him and his wife that the good target for him would be less than 8%, due to age and comorbidities. - We discussed about options for treatment, and I suggested to continue his basal insulin (given discount card) and also start a DPP 4 inhibitor to help reducing his postprandial glycemic excursions:  Patient Instructions  Please continue Levemir 40 units at bedtime. Start Onglyza 5 mg in am, before breakfast.  Check sugars 2x a day and write them down.  Please return in 1.5 month with your sugar log.   - Strongly advised him to start checking sugars at different times of the day - check 2 times a day, rotating checks - given sugar log and advised how to fill it and to bring it at next appt  - given foot care handout and explained the principles  - given instructions for hypoglycemia management "15-15 rule"  - advised for yearly eye exams - he is up-to-date   - we checked  HbA1c today: 8.5% - Return to clinic in 1.5 mo with sugar log

## 2014-11-21 NOTE — Patient Instructions (Signed)
Please continue Levemir 40 units at bedtime. Start Onglyza 5 mg in am, before breakfast.  Check sugars 2x a day and write them down.  Please return in 1.5 month with your sugar log.   PATIENT INSTRUCTIONS FOR TYPE 2 DIABETES:  **Please join MyChart!** - see attached instructions about how to join if you have not done so already.  DIET AND EXERCISE Diet and exercise is an important part of diabetic treatment.  We recommended aerobic exercise in the form of brisk walking (working between 40-60% of maximal aerobic capacity, similar to brisk walking) for 150 minutes per week (such as 30 minutes five days per week) along with 3 times per week performing 'resistance' training (using various gauge rubber tubes with handles) 5-10 exercises involving the major muscle groups (upper body, lower body and core) performing 10-15 repetitions (or near fatigue) each exercise. Start at half the above goal but build slowly to reach the above goals. If limited by weight, joint pain, or disability, we recommend daily walking in a swimming pool with water up to waist to reduce pressure from joints while allow for adequate exercise.    BLOOD GLUCOSES Monitoring your blood glucoses is important for continued management of your diabetes. Please check your blood glucoses 2-4 times a day: fasting, before meals and at bedtime (you can rotate these measurements - e.g. one day check before the 3 meals, the next day check before 2 of the meals and before bedtime, etc.).   HYPOGLYCEMIA (low blood sugar) Hypoglycemia is usually a reaction to not eating, exercising, or taking too much insulin/ other diabetes drugs.  Symptoms include tremors, sweating, hunger, confusion, headache, etc. Treat IMMEDIATELY with 15 grams of Carbs: . 4 glucose tablets .  cup regular juice/soda . 2 tablespoons raisins . 4 teaspoons sugar . 1 tablespoon honey Recheck blood glucose in 15 mins and repeat above if still symptomatic/blood glucose  <100.  RECOMMENDATIONS TO REDUCE YOUR RISK OF DIABETIC COMPLICATIONS: * Take your prescribed MEDICATION(S) * Follow a DIABETIC diet: Complex carbs, fiber rich foods, (monounsaturated and polyunsaturated) fats * AVOID saturated/trans fats, high fat foods, >2,300 mg salt per day. * EXERCISE at least 5 times a week for 30 minutes or preferably daily.  * DO NOT SMOKE OR DRINK more than 1 drink a day. * Check your FEET every day. Do not wear tightfitting shoes. Contact us if you develop an ulcer * See your EYE doctor once a year or more if needed * Get a FLU shot once a year * Get a PNEUMONIA vaccine once before and once after age 23 years  GOALS:  * Your Hemoglobin A1c of <7%  * fasting sugars need to be <130 * after meals sugars need to be <180 (2h after you start eating) * Your Systolic BP should be 161 or lower  * Your Diastolic BP should be 80 or lower  * Your HDL (Good Cholesterol) should be 40 or higher  * Your LDL (Bad Cholesterol) should be 100 or lower. * Your Triglycerides should be 150 or lower  * Your Urine microalbumin (kidney function) should be <30 * Your Body Mass Index should be 25 or lower    Please consider the following ways to cut down carbs and fat and increase fiber and micronutrients in your diet: - substitute whole grain for white bread or pasta - substitute brown rice for white rice - substitute 90-calorie flat bread pieces for slices of bread when possible - substitute sweet potatoes or yams  for white potatoes - substitute humus for margarine - substitute tofu for cheese when possible - substitute almond or rice milk for regular milk (would not drink soy milk daily due to concern for soy estrogen influence on breast cancer risk) - substitute dark chocolate for other sweets when possible - substitute water - can add lemon or orange slices for taste - for diet sodas (artificial sweeteners will trick your body that you can eat sweets without getting calories and  will lead you to overeating and weight gain in the long run) - do not skip breakfast or other meals (this will slow down the metabolism and will result in more weight gain over time)  - can try smoothies made from fruit and almond/rice milk in am instead of regular breakfast - can also try old-fashioned (not instant) oatmeal made with almond/rice milk in am - order the dressing on the side when eating salad at a restaurant (pour less than half of the dressing on the salad) - eat as little meat as possible - can try juicing, but should not forget that juicing will get rid of the fiber, so would alternate with eating raw veg./fruits or drinking smoothies - use as little oil as possible, even when using olive oil - can dress a salad with a mix of balsamic vinegar and lemon juice, for e.g. - use agave nectar, stevia sugar, or regular sugar rather than artificial sweateners - steam or broil/roast veggies  - snack on veggies/fruit/nuts (unsalted, preferably) when possible, rather than processed foods - reduce or eliminate aspartame in diet (it is in diet sodas, chewing gum, etc) Read the labels!  Try to read Dr. Janene Harvey book: "Program for Reversing Diabetes" for other ideas for healthy eating.

## 2014-11-26 ENCOUNTER — Other Ambulatory Visit: Payer: Self-pay | Admitting: Cardiology

## 2014-12-03 ENCOUNTER — Ambulatory Visit: Payer: Medicare Other | Admitting: Cardiology

## 2014-12-17 ENCOUNTER — Other Ambulatory Visit: Payer: Self-pay | Admitting: Cardiology

## 2014-12-28 ENCOUNTER — Other Ambulatory Visit: Payer: Self-pay | Admitting: Internal Medicine

## 2015-01-13 ENCOUNTER — Ambulatory Visit (INDEPENDENT_AMBULATORY_CARE_PROVIDER_SITE_OTHER): Payer: Medicare Other | Admitting: Internal Medicine

## 2015-01-13 ENCOUNTER — Encounter: Payer: Self-pay | Admitting: Internal Medicine

## 2015-01-13 VITALS — BP 128/62 | HR 70 | Temp 97.4°F | Resp 12 | Wt 181.0 lb

## 2015-01-13 DIAGNOSIS — I2510 Atherosclerotic heart disease of native coronary artery without angina pectoris: Secondary | ICD-10-CM | POA: Diagnosis not present

## 2015-01-13 DIAGNOSIS — E1151 Type 2 diabetes mellitus with diabetic peripheral angiopathy without gangrene: Secondary | ICD-10-CM

## 2015-01-13 DIAGNOSIS — E1159 Type 2 diabetes mellitus with other circulatory complications: Secondary | ICD-10-CM

## 2015-01-13 DIAGNOSIS — E1165 Type 2 diabetes mellitus with hyperglycemia: Principal | ICD-10-CM

## 2015-01-13 MED ORDER — GLIPIZIDE ER 5 MG PO TB24: 5.0000 mg | ORAL_TABLET | Freq: Every day | ORAL | Status: DC

## 2015-01-13 NOTE — Progress Notes (Signed)
Patient ID: [name], male   DOB: 07/25/1925, 81 y.o.   MRN: [id number]  HPI: [name] is a 81 y.o.-year-old male, returning for follow-up for DM2, dx in 2008, insulin-dependent since 2015, uncontrolled, with complications (CAD, s/p MI and CABG in 1994, also s/p stent; CKD).  Last hemoglobin A1c was: Lab Results  Component Value Date   HGBA1C 8.5 11/21/2014   HGBA1C 9.0* 02/12/2014   HGBA1C 9.4* 06/28/2013   Pt is on a regimen of: - Levemir 40 units at bedtime - Onglyza 5 mg in a.m. - added 11/2014 - could not afford it  Pt checks his sugars 1x a day and they are higher: - am: 138-141 (200, 245 - last 2 days as he increase his intake of sweets) >> 125-219, 233 - 2h after b'fast: n/c - before lunch: n/c - 2h after lunch: n/c >> 309 - before dinner: n/c - 2h after dinner: n/c - bedtime: n/c - nighttime: n/c No lows. Lowest sugar was 97 >> 124; he has hypoglycemia awareness at 80.  Highest sugar was 295 x1 >> 375.  Glucometer: ?  Pt's meals are: - Breakfast: 1 egg + [place] sausage + toast; occasionally 2 eggs - Lunch: sub or meat + veggies - Dinner: salad/vegetables + stake, sometimes starch - usually eats out or [employer] - Snacks: 2x a day: icecream  He plays golf, but not lately  - + CKD, last BUN/creatinine:  Lab Results  Component Value Date   BUN 27* 10/12/2014   CREATININE 1.42* 10/12/2014  On Benazepril. - last set of lipids: Lab Results  Component Value Date   CHOL 162 02/05/2014   HDL 38* 02/05/2014   LDLCALC 89 02/05/2014   LDLDIRECT 164.1 03/24/2012   TRIG 176* 02/05/2014   CHOLHDL 4.3 02/05/2014  On Crestor 10. - last eye exam was in 08/10/2014. Dr Bing Plume. No DR.  - no numbness and tingling in his feet.  ROS: Constitutional: no weight gain/loss, no fatigue, no subjective hyperthermia/hypothermia,no  nocturia Eyes: no blurry vision, no xerophthalmia ENT: no sore throat, no nodules palpated in throat, no dysphagia/odynophagia, no  hoarseness Cardiovascular: no CP/SOB/palpitations/leg swelling Respiratory: no cough/SOB Gastrointestinal: no N/V/D/C Musculoskeletal: no muscle/joint aches Skin: no rashes Neurological: no tremors/numbness/tingling/dizziness  I reviewed pt's medications, allergies, PMH, social hx, family hx, and changes were documented in the history of present illness. Otherwise, unchanged from my initial visit note:  Past Medical History  Diagnosis Date  . CAD (coronary artery disease)   . Hypertension   . Ejection fraction     45%, echo, March, 2012,   /  Ejection fraction after his MI March, 2013 is 45%. There is posterior basal akinesis with abnormal septal motion.  . Aortic insufficiency     Moderate, echo, March, 2012  . Mitral regurgitation     Moderate, echo, March, 2012  . Pulmonary hypertension     Echo, March, 2012, 42 mmHg  . Hx of CABG   . Diabetes mellitus   . Dyslipidemia   . Hypothyroidism   . Bradycardia     Sinus bradycardia  . RBBB (right bundle branch block with left anterior fascicular block)     New July, 2010  . Leg cramps     Nighttime  . Shingles     Severe pain to right flank treated  . Ischemia     .Marland KitchenMarland KitchenEF 42%  . Hyperlipidemia   . Dizziness      12/2009 &12/ 2012  . Abnormal LFTs  LFTs were abnormal with acute MI March, 2013, improving at the time of discharge  . Myocardial infarction 3/19-27/2013    ARF, elevated LFTs  . Systolic CHF   . Elevated CPK     2013, now in 2014 patient on very low dose Crestor   Past Surgical History  Procedure Laterality Date  . Coronary artery bypass graft  1996    5 vessels  . Back surgery  1999    LS sx- 1999  . Spinal fusion  2000    scar tissue, plate insertion/fusion @ [facility] - 2000  . Cardiac surgery    . Cardiac catheterization  2013    occluded graft; Dr Lia Foyer  . Left heart catheterization with coronary angiogram N/A 08/03/2011    Procedure: LEFT HEART CATHETERIZATION WITH CORONARY ANGIOGRAM;  Surgeon:  Hillary Bow, MD;  Location: [facility] CATH LAB;  Service: Cardiovascular;  Laterality: N/A;  . Left heart catheterization with coronary/graft angiogram N/A 02/05/2014    Procedure: LEFT HEART CATHETERIZATION WITH Beatrix Fetters;  Surgeon: Troy Sine, MD;  Location: [facility] CATH LAB;  Service: Cardiovascular;  Laterality: N/A;   History   Social History  . Marital Status: Married    Spouse Name: N/A  . Children: yes   Occupational History  .  [employer]    Social History Main Topics  . Smoking status: Former Smoker -- 1.00 packs/day for 5 years    Quit date: 05/17/1949  . Smokeless tobacco: Never Used     Comment: Quit at age 81  . Alcohol Use: No  . Drug Use: No   Current Outpatient Prescriptions on File Prior to Visit  Medication Sig Dispense Refill  . acetaminophen (TYLENOL) 325 MG tablet Take 650 mg by mouth every 6 (six) hours as needed for mild pain or moderate pain.     Marland Kitchen amLODipine (NORVASC) 10 MG tablet TAKE 1 TABLET BY MOUTH EVERY DAY 30 tablet 3  . aspirin EC 81 MG tablet Take 81 mg by mouth every morning.    . benazepril (LOTENSIN) 40 MG tablet TAKE 1 TABLET BY MOUTH EVERY DAY 30 tablet 1  . carvedilol (COREG) 3.125 MG tablet Take 1 tablet (3.125 mg total) by mouth 2 (two) times daily with a meal. 30 tablet 1  . cholecalciferol (VITAMIN D) 1000 UNITS tablet Take 1,000 Units by mouth daily.     . clopidogrel (PLAVIX) 75 MG tablet TAKE 1 TABLET BY MOUTH EVERY DAY 30 tablet 6  . dorzolamide (TRUSOPT) 2 % ophthalmic solution Place 1 drop into both eyes daily.     . furosemide (LASIX) 40 MG tablet Take 20 mg by mouth daily as needed for fluid.    . Insulin Detemir (LEVEMIR FLEXTOUCH) 100 UNIT/ML Pen INJECT 40 UNITS INTO THE SKIN AT BEDTIME. 5 pen 3  . Insulin Pen Needle 32G X 4 MM MISC Use 1x a day 100 each 2  . isosorbide mononitrate (IMDUR) 30 MG 24 hr tablet TAKE 1 TABLET BY MOUTH EVERY DAY 30 tablet 3  . levothyroxine (SYNTHROID, LEVOTHROID) 50 MCG tablet Take 1 tablet  (50 mcg total) by mouth daily before breakfast. 30 tablet 11  . LUMIGAN 0.03 % ophthalmic solution Place into both eyes at bedtime and may repeat dose one time if needed.     . nitroGLYCERIN (NITROSTAT) 0.4 MG SL tablet Place 0.4 mg under the tongue every 5 (five) minutes as needed for chest pain.    . NONFORMULARY OR COMPOUNDED ITEM Diabetic testing supply  Check glucose once daily if possible Fasting or morning glucose recommended M, W, F, & Sun if possible Goal= 100-150 Glucose 2 hours after breakfast Tue, after lunch Th & 2 hrs after eve meal Sat if possible Goal = < 180 1 each 0  . oxyCODONE-acetaminophen (PERCOCET/ROXICET) 5-325 MG per tablet Take 1/2-1 tablet every 4 hours as needed for pain.  Do not combine with additional Tylenol. 20 tablet 0  . rosuvastatin (CRESTOR) 10 MG tablet Take 10 mg by mouth 2 (two) times a week. Takes on Sundays and Wednesdays    . saxagliptin HCl (ONGLYZA) 5 MG TABS tablet Take 1 tablet (5 mg total) by mouth daily. 30 tablet 2  . [DISCONTINUED] pravastatin (PRAVACHOL) 20 MG tablet Take 20 mg by mouth daily.       No current facility-administered medications on file prior to visit.   Allergies  Allergen Reactions  . Atorvastatin     REACTION: myalgias  . Folic Acid     REACTION: Face burns  . Nicardipine Hcl     REACTION: Gingival hypertrophy  . Pravastatin     myalgias   Family History  Problem Relation Age of Onset  . Diabetes Sister   . Heart attack Father 47  . Lung cancer Sister   . Stroke Neg Hx   . Diabetes Brother   . Hyperlipidemia Mother    PE: BP 128/62 mmHg  Pulse 70  Temp(Src) 97.4 F (36.3 C) (Oral)  Resp 12  Wt 181 lb (82.101 kg)  SpO2 98% Body mass index is 26.72 kg/(m^2). Wt Readings from Last 3 Encounters:  01/13/15 181 lb (82.101 kg)  11/21/14 181 lb 3.2 oz (82.192 kg)  10/12/14 180 lb (81.647 kg)   Constitutional: normal weight, in NAD Eyes: PERRLA, EOMI, no exophthalmos ENT: moist mucous membranes, no  thyromegaly, no cervical lymphadenopathy Cardiovascular: RRR, No MRG Respiratory: CTA B Gastrointestinal: abdomen soft, NT, ND, BS+ Musculoskeletal: no deformities, strength intact in all 4 Skin: moist, warm, no rashes Neurological: no tremor with outstretched hands, DTR normal in all 4  ASSESSMENT: 1. DM2, insulin-dependent, uncontrolled, with complications - chronic kidney disease  - CAD, s/p MI, s/p CABG, s/p stent   PLAN:  1. Patient with long-standing, uncontrolled diabetes, on a basal insulin regimen. He could not afford the DPP4 inhibitor. He tried this for 1 mo, and sugars improved a little, but now off and sugars even higher than before... A good HbA1c target for him would be less than 8%, due to age and comorbidities. - We discussed about options for treatment, and I suggested to continue his basal insulin + add a sulfonylurea: Patient Instructions  Please continue Levemir 40 units at bedtime. Start Glipizide ER 5 mg in am, before breakfast.  Check sugars 2x a day and write them down.  Please call me in 1 week to let me know about the sugars.  Please return in 1.5 months with your sugar log.   - continue checking sugars at different times of the day - check 2 times a day, rotating checks - advised for yearly eye exams - he is up-to-date   - we reviewed his HbA1c from last visit: 8.5% (decreasing) - Return to clinic in 1.5 mo with sugar log

## 2015-01-13 NOTE — Patient Instructions (Addendum)
Please continue Levemir 40 units at bedtime. Start Glipizide ER 5 mg in am, before breakfast.  Check sugars 2x a day and write them down.  Please call me in 1 week to let me know about the sugars.  Please return in 1.5 months with your sugar log.

## 2015-01-21 ENCOUNTER — Telehealth: Payer: Self-pay | Admitting: Internal Medicine

## 2015-01-21 NOTE — Telephone Encounter (Signed)
Please read message below and advise.  

## 2015-01-21 NOTE — Telephone Encounter (Signed)
Let's increase the dose of Glipizide ER to 10 mg and call back in 2 days with sugars.

## 2015-01-21 NOTE — Telephone Encounter (Signed)
Called pt and lvm advising him per Dr Arman Filter result note. Advised them to call back with any questions.

## 2015-01-21 NOTE — Telephone Encounter (Signed)
Patients wife called stating that Dr. Cruzita Lederer had put [name] on a new medication glipizide  The medication is not working and sugars are still high    Please advise  Thank you

## 2015-01-24 ENCOUNTER — Telehealth: Payer: Self-pay | Admitting: Internal Medicine

## 2015-01-24 NOTE — Telephone Encounter (Signed)
Please read message below and advise.  

## 2015-01-24 NOTE — Telephone Encounter (Signed)
Pt has been on the onglyza for 2 days and they are reporting his BS readings: 9/7 AM 203 9/8 AM 258 9/9 AM 179 It is not coming down

## 2015-01-24 NOTE — Telephone Encounter (Signed)
Called pt and lvm advising him per Dr Arman Filter message below.

## 2015-01-24 NOTE — Telephone Encounter (Signed)
He needs to give it at least 2 weeks for full effect.

## 2015-01-28 ENCOUNTER — Other Ambulatory Visit: Payer: Self-pay | Admitting: Cardiology

## 2015-01-29 ENCOUNTER — Other Ambulatory Visit: Payer: Self-pay | Admitting: Internal Medicine

## 2015-02-05 ENCOUNTER — Encounter: Payer: Self-pay | Admitting: Cardiology

## 2015-02-05 ENCOUNTER — Ambulatory Visit (INDEPENDENT_AMBULATORY_CARE_PROVIDER_SITE_OTHER): Payer: Medicare Other | Admitting: Cardiology

## 2015-02-05 VITALS — BP 100/65 | HR 61 | Ht 69.0 in | Wt 181.0 lb

## 2015-02-05 DIAGNOSIS — IMO0002 Reserved for concepts with insufficient information to code with codable children: Secondary | ICD-10-CM

## 2015-02-05 DIAGNOSIS — Z01810 Encounter for preprocedural cardiovascular examination: Secondary | ICD-10-CM | POA: Diagnosis not present

## 2015-02-05 DIAGNOSIS — R0989 Other specified symptoms and signs involving the circulatory and respiratory systems: Secondary | ICD-10-CM | POA: Diagnosis not present

## 2015-02-05 DIAGNOSIS — I2510 Atherosclerotic heart disease of native coronary artery without angina pectoris: Secondary | ICD-10-CM | POA: Diagnosis not present

## 2015-02-05 DIAGNOSIS — I5022 Chronic systolic (congestive) heart failure: Secondary | ICD-10-CM

## 2015-02-05 DIAGNOSIS — R9430 Abnormal result of cardiovascular function study, unspecified: Secondary | ICD-10-CM

## 2015-02-05 NOTE — Progress Notes (Signed)
Cardiology Office Note   Date:  02/05/2015   ID:  [name], DOB 1926/03/22, MRN [id number]  PCP:  Unice Cobble, MD  Cardiologist:  Dola Argyle, MD   Chief Complaint  Patient presents with  . Appointment    Follow-up coronary artery disease      History of Present Illness: [name] is a 81 y.o. male who presents today to follow up coronary disease. I have followed him for many many years. I also take care of his son. 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 He underwent CABG in the past. He's also had stents more recently.  He has not been having any chest pain or significant shortness of breath.    Past Medical History  Diagnosis Date  . CAD (coronary artery disease)   . Hypertension   . Ejection fraction     45%, echo, March, 2012,   /  Ejection fraction after his MI March, 2013 is 45%. There is posterior basal akinesis with abnormal septal motion.  . Aortic insufficiency     Moderate, echo, March, 2012  . Mitral regurgitation     Moderate, echo, March, 2012  . Pulmonary hypertension     Echo, March, 2012, 42 mmHg  . Hx of CABG   . Diabetes mellitus   . Dyslipidemia   . Hypothyroidism   . Bradycardia     Sinus bradycardia  . RBBB (right bundle branch block with left anterior fascicular block)     New July, 2010  . Leg cramps     Nighttime  . Shingles     Severe pain to right flank treated  . Ischemia     .Marland KitchenMarland KitchenEF 42%  . Hyperlipidemia   . Dizziness      12/2009 &12/ 2012  . Abnormal LFTs     LFTs were abnormal with acute MI March, 2013, improving at the time of discharge  . Myocardial infarction 3/19-27/2013    ARF, elevated LFTs  . Systolic CHF   . Elevated CPK     2013, now in 2014 patient on very low dose Crestor    Past Surgical History  Procedure Laterality Date  . Coronary artery bypass graft  1996    5 vessels  . Back surgery  1999    LS sx- 1999  . Spinal fusion  2000    scar tissue, plate  insertion/fusion @ [facility] - 2000  . Cardiac surgery    . Cardiac catheterization  2013    occluded graft; Dr Lia Foyer  . Left heart catheterization with coronary angiogram N/A 08/03/2011    Procedure: LEFT HEART CATHETERIZATION WITH CORONARY ANGIOGRAM;  Surgeon: Hillary Bow, MD;  Location: [facility] CATH LAB;  Service: Cardiovascular;  Laterality: N/A;  . Left heart catheterization with coronary/graft angiogram N/A 02/05/2014    Procedure: LEFT HEART CATHETERIZATION WITH Beatrix Fetters;  Surgeon: Troy Sine, MD;  Location: [facility] CATH LAB;  Service: Cardiovascular;  Laterality: N/A;    Patient Active Problem List   Diagnosis Date Noted  . Poorly controlled type 2 diabetes mellitus with circulatory disorder 11/21/2014  . Bilateral carotid bruits 02/28/2014  . Periodontal disease 02/13/2014  . CAD (coronary artery disease)   . Statin intolerance 07/20/2013  . Chronic systolic CHF (congestive heart failure) 08/31/2012  . Elevated CPK   . Abnormal LFTs   . Ejection fraction   . Hypokalemia 08/10/2011  . Renal insufficiency 08/03/2011  . Hypertension   .  Aortic insufficiency   . Mitral regurgitation   . Pulmonary hypertension   . Hx of CABG   . Dyslipidemia   . Hypothyroidism-TSH 9   . Bradycardia   . RBBB with LAFB   . Leg cramps   . Shingles   . Dizziness   . POSTHERPETIC NEURALGIA 04/29/2009  . VITAMIN D DEFICIENCY 02/17/2009  . ARTHRALGIA 02/12/2009  . UNSPECIFIED MYALGIA AND MYOSITIS 02/12/2009  . SHORTNESS OF BREATH 11/28/2008  . THROMBOCYTOPENIA 08/12/2008  . ADVERSE DRUG REACTION 07/16/2008  . DM (diabetes mellitus), secondary, uncontrolled, with peripheral vascular complications 62/69/4854  . GLAUCOMA, BORDERLINE 10/16/2007      Current Outpatient Prescriptions  Medication Sig Dispense Refill  . acetaminophen (TYLENOL) 325 MG tablet Take 650 mg by mouth every 6 (six) hours as needed for mild pain or moderate pain.     Marland Kitchen amLODipine (NORVASC) 10 MG tablet  TAKE 1 TABLET BY MOUTH EVERY DAY 30 tablet 3  . aspirin EC 81 MG tablet Take 81 mg by mouth every morning.    . benazepril (LOTENSIN) 40 MG tablet TAKE 1 TABLET BY MOUTH EVERY DAY 30 tablet 1  . carvedilol (COREG) 3.125 MG tablet Take 1 tablet (3.125 mg total) by mouth 2 (two) times daily with a meal. 30 tablet 1  . cholecalciferol (VITAMIN D) 1000 UNITS tablet Take 1,000 Units by mouth daily.     . clopidogrel (PLAVIX) 75 MG tablet TAKE 1 TABLET BY MOUTH EVERY DAY 30 tablet 6  . dorzolamide (TRUSOPT) 2 % ophthalmic solution Place 1 drop into both eyes daily.     . furosemide (LASIX) 40 MG tablet Take 20 mg by mouth daily as needed for fluid.    Marland Kitchen glipiZIDE (GLUCOTROL XL) 5 MG 24 hr tablet Take 1 tablet (5 mg total) by mouth daily with breakfast. 60 tablet 2  . Insulin Detemir (LEVEMIR FLEXTOUCH) 100 UNIT/ML Pen INJECT 40 UNITS INTO THE SKIN AT BEDTIME. 5 pen 3  . Insulin Pen Needle 32G X 4 MM MISC Use 1x a day 100 each 2  . isosorbide mononitrate (IMDUR) 30 MG 24 hr tablet TAKE 1 TABLET BY MOUTH EVERY DAY 30 tablet 5  . levothyroxine (SYNTHROID, LEVOTHROID) 50 MCG tablet Take 1 tablet (50 mcg total) by mouth daily before breakfast. 30 tablet 11  . LUMIGAN 0.03 % ophthalmic solution Place into both eyes at bedtime and may repeat dose one time if needed.     . nitroGLYCERIN (NITROSTAT) 0.4 MG SL tablet Place 0.4 mg under the tongue every 5 (five) minutes as needed for chest pain.    . NONFORMULARY OR COMPOUNDED ITEM Diabetic testing supply Check glucose once daily if possible Fasting or morning glucose recommended M, W, F, & Sun if possible Goal= 100-150 Glucose 2 hours after breakfast Tue, after lunch Th & 2 hrs after eve meal Sat if possible Goal = < 180 1 each 0  . oxyCODONE-acetaminophen (PERCOCET/ROXICET) 5-325 MG per tablet Take 1/2-1 tablet every 4 hours as needed for pain.  Do not combine with additional Tylenol. 20 tablet 0  . rosuvastatin (CRESTOR) 10 MG tablet Take 10 mg by mouth 2  (two) times a week. Takes on Sundays and Wednesdays    . rosuvastatin (CRESTOR) 10 MG tablet TAKE 1 TABLET EVERY SUNDAY AND WEDNESDAY 30 tablet 0  . [DISCONTINUED] pravastatin (PRAVACHOL) 20 MG tablet Take 20 mg by mouth daily.       No current facility-administered medications for this visit.    Allergies:  Atorvastatin; Folic acid; Nicardipine hcl; and Pravastatin    Social History:  The patient  reports that he quit smoking about 65 years ago. He has never used smokeless tobacco. He reports that he does not drink alcohol or use illicit drugs.   Family History:  The patient's family history includes Diabetes in his brother and sister; Heart attack (age of onset: 79) in his father; Hyperlipidemia in his mother; Hypertension in his son; Lung cancer in his sister. There is no history of Stroke.    ROS:  Please see the history of present illness.    Patent denies fever, chills, headache, sweats, rash, change in vision, change in hearing, chest pain, cough, nausea or vomiting, urinary symptoms. All other systems are reviewed and are negative.    PHYSICAL EXAM: VS:  Ht '5\' 9"'$  (1.753 m)  Wt 181 lb (82.101 kg)  BMI 26.72 kg/m2 , Patient is here with his wife. He is oriented to person time and place. Affect is normal. Head is atraumatic. Sclera and conjunctiva are normal. The patient has a loud, high-pitched, right carotid bruit. Lungs are clear. Respiratory effort is nonlabored. Cardiac exam reveals an S1 and S2. Abdomen is soft. There is no peripheral edema. There are no musculoskeletal deformities. There are no skin rashes. Neurologic is grossly intact.   EKG:  EKG is not done today.   Recent Labs: 03/14/2014: TSH 3.60 10/12/2014: ALT 15*; BUN 27*; Creatinine, Ser 1.42*; Hemoglobin 12.4*; Platelets 185; Potassium 4.1; Sodium 138    Lipid Panel    Component Value Date/Time   CHOL 162 02/05/2014 0740   TRIG 176* 02/05/2014 0740   HDL 38* 02/05/2014 0740   CHOLHDL 4.3 02/05/2014 0740     VLDL 35 02/05/2014 0740   LDLCALC 89 02/05/2014 0740   LDLDIRECT 164.1 03/24/2012 1353      Wt Readings from Last 3 Encounters:  02/05/15 181 lb (82.101 kg)  01/13/15 181 lb (82.101 kg)  11/21/14 181 lb 3.2 oz (82.192 kg)      Current medicines are reviewed  The patient understands his medications.     ASSESSMENT AND PLAN:

## 2015-02-05 NOTE — Assessment & Plan Note (Signed)
The patient's ejection fraction was 45% in September, 2015. There was akinesis of the base inferior segment in the entire inferolateral segments.

## 2015-02-05 NOTE — Patient Instructions (Signed)
Medication Instructions:  Same-no changes  Labwork: none  Testing/Procedures: Your physician has requested that you have a carotid duplex. This test is an ultrasound of the carotid arteries in your neck. It looks at blood flow through these arteries that supply the brain with blood. Allow one hour for this exam. There are no restrictions or special instructions.  Per Dr Ron Parker this test needs to be done either tomorrow 9/22 or Friday 9/23   Follow-Up: Your physician wants you to follow-up in: 4 months with Dr Marlou Porch.You will receive a reminder letter in the mail two months in advance. If you don't receive a letter, please call our office to schedule the follow-up appointment.

## 2015-02-05 NOTE — Assessment & Plan Note (Signed)
In the office today the patient has a high pitched right carotid bruit. He will have a Doppler done within the next several days. If he has a critical carotid lesion, he is cleared for carotid surgery. He has not had any recent significant congestive heart failure. He has not had any recent significant chest discomfort or arrhythmias. His exercise capacity is greater than four mets.   As part of today's evaluation I've spent greater than 25 minutes with the patient is total care. More than half of this time has been with direct contact counseling the patient and his wife concerning his overall cardiac status and the finding in his carotid artery.

## 2015-02-05 NOTE — Assessment & Plan Note (Signed)
The patient's volume status is been quite stable. For her. Of time he required a diarrhetic. More recently his volume has been stable with no diuretics.

## 2015-02-05 NOTE — Assessment & Plan Note (Signed)
The patient has had carotid bruits over time. After careful review, I realized that I do not have recent carotid Doppler data. The patient has a high pitched right carotid bruit today. I've explained this to the patient and his wife. We will arrange for carotid Doppler in the next several days. He is not having any symptoms.

## 2015-02-05 NOTE — Assessment & Plan Note (Signed)
Coronary disease is stable area his last cath was September, 2015. LIMA to the LAD was patent. There was distal LAD disease. There was occluded vein graft previously supplied the circumflex. There was an occluded vein graft to the diagonal. Medical therapy was recommended. The patient is not having any significant chest pain at this time. No further workup.

## 2015-02-10 ENCOUNTER — Telehealth: Payer: Self-pay

## 2015-02-10 ENCOUNTER — Ambulatory Visit (HOSPITAL_COMMUNITY)
Admission: RE | Admit: 2015-02-10 | Discharge: 2015-02-10 | Disposition: A | Discharge status: Home / Self Care | Payer: Medicare Other | Source: Ambulatory Visit | Attending: Cardiology | Admitting: Cardiology

## 2015-02-10 DIAGNOSIS — I6523 Occlusion and stenosis of bilateral carotid arteries: Secondary | ICD-10-CM | POA: Diagnosis not present

## 2015-02-10 DIAGNOSIS — I10 Essential (primary) hypertension: Secondary | ICD-10-CM | POA: Insufficient documentation

## 2015-02-10 DIAGNOSIS — R0989 Other specified symptoms and signs involving the circulatory and respiratory systems: Secondary | ICD-10-CM | POA: Insufficient documentation

## 2015-02-10 DIAGNOSIS — R9389 Abnormal findings on diagnostic imaging of other specified body structures: Secondary | ICD-10-CM

## 2015-02-10 DIAGNOSIS — E785 Hyperlipidemia, unspecified: Secondary | ICD-10-CM | POA: Insufficient documentation

## 2015-02-10 DIAGNOSIS — E119 Type 2 diabetes mellitus without complications: Secondary | ICD-10-CM | POA: Diagnosis not present

## 2015-02-10 NOTE — Telephone Encounter (Signed)
LMTCB. Per Dr Ron Parker and due to the pts Carotid Doppler results I called the pt to ask if we can refer him to Dr Gae Gallop with [facility].

## 2015-02-11 NOTE — Telephone Encounter (Signed)
The pts is advised and he agrees to see Dr Scot Dock at [facility]. Amb referral has been ordered and the pt is aware that someone will contact him to arrange date and time of appt.

## 2015-02-11 NOTE — Telephone Encounter (Signed)
F/u        Pt returning [name]'s phone call.

## 2015-02-12 ENCOUNTER — Encounter: Payer: Self-pay | Admitting: Cardiology

## 2015-02-12 NOTE — Progress Notes (Signed)
The patient has a loud, high-pitched, right carotid bruit. His carotid Doppler shows severe disease. He is not having symptoms. I am referring him to vascular surgery. The patient has known significant cardiac disease. However, he has been stable. I do feel we should give cares full consideration to the possibility of treating his right carotid.  Daryel November, MD

## 2015-02-18 ENCOUNTER — Other Ambulatory Visit: Payer: Self-pay | Admitting: Cardiology

## 2015-02-20 ENCOUNTER — Other Ambulatory Visit: Payer: Self-pay | Admitting: *Deleted

## 2015-02-20 DIAGNOSIS — I6521 Occlusion and stenosis of right carotid artery: Secondary | ICD-10-CM

## 2015-02-25 ENCOUNTER — Ambulatory Visit (INDEPENDENT_AMBULATORY_CARE_PROVIDER_SITE_OTHER): Payer: Medicare Other | Admitting: Internal Medicine

## 2015-02-25 ENCOUNTER — Encounter: Payer: Self-pay | Admitting: Internal Medicine

## 2015-02-25 ENCOUNTER — Other Ambulatory Visit (INDEPENDENT_AMBULATORY_CARE_PROVIDER_SITE_OTHER): Payer: Medicare Other | Admitting: *Deleted

## 2015-02-25 VITALS — BP 106/68 | HR 66 | Temp 97.6°F | Resp 12 | Wt 182.8 lb

## 2015-02-25 DIAGNOSIS — E1159 Type 2 diabetes mellitus with other circulatory complications: Secondary | ICD-10-CM | POA: Diagnosis not present

## 2015-02-25 DIAGNOSIS — E1165 Type 2 diabetes mellitus with hyperglycemia: Secondary | ICD-10-CM | POA: Diagnosis not present

## 2015-02-25 DIAGNOSIS — E1351 Other specified diabetes mellitus with diabetic peripheral angiopathy without gangrene: Secondary | ICD-10-CM | POA: Diagnosis not present

## 2015-02-25 DIAGNOSIS — I6521 Occlusion and stenosis of right carotid artery: Secondary | ICD-10-CM

## 2015-02-25 DIAGNOSIS — IMO0002 Reserved for concepts with insufficient information to code with codable children: Secondary | ICD-10-CM

## 2015-02-25 DIAGNOSIS — E1365 Other specified diabetes mellitus with hyperglycemia: Secondary | ICD-10-CM | POA: Diagnosis not present

## 2015-02-25 LAB — POCT GLYCOSYLATED HEMOGLOBIN (HGB A1C): Hemoglobin A1C: 7.3

## 2015-02-25 NOTE — Patient Instructions (Signed)
Please continue Levemir 40 units at bedtime. Continue Glipizide ER 10 mg in am, before breakfast.  Check sugars 1x a day and write them down.  Please let me know if the sugars are consistently <80 or >200.  Please return in 1.5 months with your sugar log.

## 2015-02-25 NOTE — Progress Notes (Signed)
Patient ID: [name], male   DOB: 29-Oct-1925, 81 y.o.   MRN: [id number]  HPI: [name] is a 81 y.o.-year-old male, returning for follow-up for DM2, dx in 2008, insulin-dependent since 2015, uncontrolled, with complications (CAD, s/p MI and CABG in 1994, also s/p stent; CKD). Last 1.5 mo ago.  Last hemoglobin A1c was: Lab Results  Component Value Date   HGBA1C 8.5 11/21/2014   HGBA1C 9.0* 02/12/2014   HGBA1C 9.4* 06/28/2013   Pt is on a regimen of: - Levemir 40 units at bedtime - Glipizide XL 10 mg daily Could not afford Onglyza 5 mg in a.m  Pt checks his sugars 1x a day in am and they are higher: - am: 138-141 (200, 245 - last 2 days as he increase his intake of sweets) >> 125-219, 233 >> 107-155, 169, 210 - 2h after b'fast: n/c - before lunch: n/c - 2h after lunch: n/c >> 309 >> 340 - before dinner: n/c - 2h after dinner: n/c - bedtime: n/c - nighttime: n/c No lows. Lowest sugar was 97 >> 124 >> 107; he has hypoglycemia awareness at 80.  Highest sugar was 295 x1 >> 375 >> 340x1.  Glucometer: ?  Pt's meals are: - Breakfast: 1 egg + [place] sausage + toast; occasionally 2 eggs - Lunch: sub or meat + veggies - Dinner: salad/vegetables + stake, sometimes starch - usually eats out or [employer] - Snacks: 2x a day: icecream  He plays golf, but not lately  - + CKD, last BUN/creatinine:  Lab Results  Component Value Date   BUN 27* 10/12/2014   CREATININE 1.42* 10/12/2014  On Benazepril. - last set of lipids: Lab Results  Component Value Date   CHOL 162 02/05/2014   HDL 38* 02/05/2014   LDLCALC 89 02/05/2014   LDLDIRECT 164.1 03/24/2012   TRIG 176* 02/05/2014   CHOLHDL 4.3 02/05/2014  On Crestor 10. - last eye exam was in 08/10/2014. Dr Bing Plume. No DR.  - no numbness and tingling in his feet.  ROS: Constitutional: no weight gain/loss, no fatigue, no subjective hyperthermia/hypothermia,no  nocturia Eyes: no blurry vision, no xerophthalmia ENT: no sore  throat, no nodules palpated in throat, no dysphagia/odynophagia, no hoarseness Cardiovascular: no CP/SOB/palpitations/leg swelling Respiratory: no cough/SOB Gastrointestinal: no N/V/D/C Musculoskeletal: no muscle/joint aches Skin: no rashes Neurological: no tremors/numbness/tingling/dizziness  I reviewed pt's medications, allergies, PMH, social hx, family hx, and changes were documented in the history of present illness. Otherwise, unchanged from my initial visit note:  Past Medical History  Diagnosis Date  . CAD (coronary artery disease)   . Hypertension   . Ejection fraction     45%, echo, March, 2012,   /  Ejection fraction after his MI March, 2013 is 45%. There is posterior basal akinesis with abnormal septal motion.  . Aortic insufficiency     Moderate, echo, March, 2012  . Mitral regurgitation     Moderate, echo, March, 2012  . Pulmonary hypertension ([place])     Echo, March, 2012, 42 mmHg  . Hx of CABG   . Diabetes mellitus   . Dyslipidemia   . Hypothyroidism   . Bradycardia     Sinus bradycardia  . RBBB (right bundle branch block with left anterior fascicular block)     New July, 2010  . Leg cramps     Nighttime  . Shingles     Severe pain to right flank treated  . Ischemia     .Marland KitchenMarland KitchenEF 42%  . Hyperlipidemia   .  Dizziness      12/2009 &12/ 2012  . Abnormal LFTs     LFTs were abnormal with acute MI March, 2013, improving at the time of discharge  . Myocardial infarction ([place]) 3/19-27/2013    ARF, elevated LFTs  . Systolic CHF ([place])   . Elevated CPK     2013, now in 2014 patient on very low dose Crestor   Past Surgical History  Procedure Laterality Date  . Coronary artery bypass graft  1996    5 vessels  . Back surgery  1999    LS sx- 1999  . Spinal fusion  2000    scar tissue, plate insertion/fusion @ [facility] spine - 2000  . Cardiac surgery    . Cardiac catheterization  2013    occluded graft; Dr Lia Foyer  . Left heart catheterization with coronary angiogram N/A  08/03/2011    Procedure: LEFT HEART CATHETERIZATION WITH CORONARY ANGIOGRAM;  Surgeon: Hillary Bow, MD;  Location: [facility] CATH LAB;  Service: Cardiovascular;  Laterality: N/A;  . Left heart catheterization with coronary/graft angiogram N/A 02/05/2014    Procedure: LEFT HEART CATHETERIZATION WITH Beatrix Fetters;  Surgeon: Troy Sine, MD;  Location: [facility] CATH LAB;  Service: Cardiovascular;  Laterality: N/A;   History   Social History  . Marital Status: Married    Spouse Name: N/A  . Children: yes   Occupational History  .  [employer]    Social History Main Topics  . Smoking status: Former Smoker -- 1.00 packs/day for 5 years    Quit date: 05/17/1949  . Smokeless tobacco: Never Used     Comment: Quit at age 15  . Alcohol Use: No  . Drug Use: No   Current Outpatient Prescriptions on File Prior to Visit  Medication Sig Dispense Refill  . acetaminophen (TYLENOL) 325 MG tablet Take 650 mg by mouth every 6 (six) hours as needed for mild pain or moderate pain.     Marland Kitchen amLODipine (NORVASC) 10 MG tablet TAKE 1 TABLET BY MOUTH EVERY DAY 30 tablet 3  . aspirin EC 81 MG tablet Take 81 mg by mouth every morning.    . benazepril (LOTENSIN) 40 MG tablet TAKE 1 TABLET BY MOUTH EVERY DAY 30 tablet 2  . carvedilol (COREG) 3.125 MG tablet TAKE 1 TABLET (3.125 MG TOTAL) BY MOUTH 2 (TWO) TIMES DAILY WITH A MEAL. 60 tablet 2  . cholecalciferol (VITAMIN D) 1000 UNITS tablet Take 1,000 Units by mouth daily.     . clopidogrel (PLAVIX) 75 MG tablet TAKE 1 TABLET BY MOUTH EVERY DAY 30 tablet 6  . dorzolamide (TRUSOPT) 2 % ophthalmic solution Place 1 drop into both eyes daily.     . furosemide (LASIX) 40 MG tablet Take 20 mg by mouth daily as needed for fluid.    Marland Kitchen glipiZIDE (GLUCOTROL) 10 MG tablet Take 10 mg by mouth daily before breakfast.    . Insulin Detemir (LEVEMIR FLEXTOUCH) 100 UNIT/ML Pen INJECT 40 UNITS INTO THE SKIN AT BEDTIME. 5 pen 3  . isosorbide mononitrate (IMDUR) 30 MG 24 hr tablet TAKE  1 TABLET BY MOUTH EVERY DAY 30 tablet 5  . levothyroxine (SYNTHROID, LEVOTHROID) 50 MCG tablet Take 1 tablet (50 mcg total) by mouth daily before breakfast. 30 tablet 11  . LUMIGAN 0.01 % SOLN Place 1 drop into both eyes at bedtime.   2  . LUMIGAN 0.03 % ophthalmic solution Place into both eyes at bedtime and may repeat dose one time if needed.     Marland Kitchen  nitroGLYCERIN (NITROSTAT) 0.4 MG SL tablet Place 0.4 mg under the tongue every 5 (five) minutes as needed for chest pain.    . NONFORMULARY OR COMPOUNDED ITEM Diabetic testing supply Check glucose once daily if possible Fasting or morning glucose recommended M, W, F, & Sun if possible Goal= 100-150 Glucose 2 hours after breakfast Tue, after lunch Th & 2 hrs after eve meal Sat if possible Goal = < 180 1 each 0  . rosuvastatin (CRESTOR) 10 MG tablet Take 10 mg by mouth 2 (two) times a week. Takes on Sundays and Wednesdays    . [DISCONTINUED] pravastatin (PRAVACHOL) 20 MG tablet Take 20 mg by mouth daily.       No current facility-administered medications on file prior to visit.   Allergies  Allergen Reactions  . Atorvastatin     REACTION: myalgias  . Folic Acid     REACTION: Face burns  . Nicardipine Hcl     REACTION: Gingival hypertrophy  . Pravastatin     myalgias   Family History  Problem Relation Age of Onset  . Diabetes Sister   . Heart attack Father 51  . Lung cancer Sister   . Stroke Neg Hx   . Diabetes Brother   . Hyperlipidemia Mother   . Hypertension Son    PE: BP 106/68 mmHg  Pulse 66  Temp(Src) 97.6 F (36.4 C) (Oral)  Resp 12  Wt 182 lb 12.8 oz (82.918 kg)  SpO2 98% Body mass index is 26.98 kg/(m^2). Wt Readings from Last 3 Encounters:  02/25/15 182 lb 12.8 oz (82.918 kg)  02/05/15 181 lb (82.101 kg)  01/13/15 181 lb (82.101 kg)   Constitutional: normal weight, in NAD Eyes: PERRLA, EOMI, no exophthalmos ENT: moist mucous membranes, no thyromegaly, no cervical lymphadenopathy Cardiovascular: RRR,+ SEM 1/6, no  RG Respiratory: CTA B Gastrointestinal: abdomen soft, NT, ND, BS+ Musculoskeletal: no deformities, strength intact in all 4 Skin: moist, warm, no rashes Neurological: no tremor with outstretched hands, DTR normal in all 4  ASSESSMENT: 1. DM2, insulin-dependent, uncontrolled, with complications - chronic kidney disease  - CAD, s/p MI, s/p CABG, s/p stent   PLAN:  1. Patient with long-standing, uncontrolled diabetes, on a basal insulin regimen. He could not afford the DPP4 inhibitor. He tried this for 1 mo, and sugars improved a little, but now on Glipizide and Levemir. Sugars are better, but difficult to tell as he only checks sugars in am. A good HbA1c target for him would be less than 8%, due to age and comorbidities. - I suggested to continue his basal insulin + sulfonylurea: Patient Instructions  Please continue Levemir 40 units at bedtime. Continue Glipizide ER 10 mg in am, before breakfast.  Check sugars 1x a day and write them down.  Please let me know if the sugars are consistently <80 or >200.  Please return in 1.5 months with your sugar log.   - continue checking sugars at different times of the day - check 2 times a day, rotating checks - advised for yearly eye exams - he is up-to-date   - need to check a lipid panel at next visit - we reviewed his HbA1c >> 7.3% (much better) - Return to clinic in 76mowith sugar log

## 2015-03-03 ENCOUNTER — Other Ambulatory Visit: Payer: Self-pay | Admitting: Cardiology

## 2015-03-04 NOTE — Telephone Encounter (Signed)
Refer to PCP please.  Kerin Ransom PA-C 03/04/2015 10:14 AM

## 2015-03-06 ENCOUNTER — Other Ambulatory Visit: Payer: Self-pay

## 2015-03-07 ENCOUNTER — Other Ambulatory Visit: Payer: Self-pay | Admitting: Cardiology

## 2015-03-07 NOTE — Telephone Encounter (Signed)
Synthroid refill refused - defer to PCP

## 2015-03-10 ENCOUNTER — Other Ambulatory Visit: Payer: Self-pay

## 2015-03-10 MED ORDER — LEVOTHYROXINE SODIUM 50 MCG PO TABS: 50.0000 ug | ORAL_TABLET | Freq: Every day | ORAL | Status: DC

## 2015-03-31 DIAGNOSIS — H524 Presbyopia: Secondary | ICD-10-CM | POA: Diagnosis not present

## 2015-03-31 DIAGNOSIS — H25813 Combined forms of age-related cataract, bilateral: Secondary | ICD-10-CM | POA: Diagnosis not present

## 2015-04-04 ENCOUNTER — Other Ambulatory Visit: Payer: Self-pay | Admitting: Cardiology

## 2015-04-09 ENCOUNTER — Encounter: Payer: Self-pay | Admitting: Surgery

## 2015-04-14 ENCOUNTER — Encounter: Payer: Self-pay | Admitting: Surgery

## 2015-04-14 ENCOUNTER — Ambulatory Visit (HOSPITAL_COMMUNITY)
Admission: RE | Admit: 2015-04-14 | Discharge: 2015-04-14 | Disposition: A | Discharge status: Home / Self Care | Payer: Medicare Other | Source: Ambulatory Visit | Attending: Surgery | Admitting: Surgery

## 2015-04-14 ENCOUNTER — Ambulatory Visit (INDEPENDENT_AMBULATORY_CARE_PROVIDER_SITE_OTHER): Payer: Medicare Other | Admitting: Surgery

## 2015-04-14 VITALS — BP 142/57 | HR 64 | Ht 69.0 in | Wt 186.5 lb

## 2015-04-14 DIAGNOSIS — I6521 Occlusion and stenosis of right carotid artery: Secondary | ICD-10-CM | POA: Insufficient documentation

## 2015-04-14 DIAGNOSIS — E785 Hyperlipidemia, unspecified: Secondary | ICD-10-CM | POA: Insufficient documentation

## 2015-04-14 DIAGNOSIS — I10 Essential (primary) hypertension: Secondary | ICD-10-CM | POA: Diagnosis not present

## 2015-04-14 DIAGNOSIS — E119 Type 2 diabetes mellitus without complications: Secondary | ICD-10-CM | POA: Diagnosis not present

## 2015-04-14 NOTE — Progress Notes (Signed)
Patient name: [name] MRN: [id number] DOB: [birth year] Sex: male   Referred by: Dr. Ron Parker  Reason for referral:  Chief Complaint  Patient presents with  . New Evaluation    eval RICA stenosis - c/o pain R neck X 3 months    HISTORY OF PRESENT ILLNESS:  this is an 81 year old gentleman who comes in today for evaluation of carotid disease.  He has had a carotid bruit detected which ultimately led to serial ultrasounds.  His most recent ultrasound showed greater than 80% right carotid stenosis.  The patient remains asymptomatic.  Specifically he denies numbness or weakness in either extremity.  He denies slurred speech.  He denies amaurosis fugax.   Patient suffers from coronary artery disease. He is status post CABG and stenting. He is on dual antiplatelet therapy. He is medically managed for hypertension.  He is on ACE inhibitor.  He suffers from hypercholesterolemia for which he takes a statin.  Patient is also a diabetic. His most recent hemoglobin A1c is 7.3.  Past Medical History  Diagnosis Date  . CAD (coronary artery disease)   . Hypertension   . Ejection fraction     45%, echo, March, 2012,   /  Ejection fraction after his MI March, 2013 is 45%. There is posterior basal akinesis with abnormal septal motion.  . Aortic insufficiency     Moderate, echo, March, 2012  . Mitral regurgitation     Moderate, echo, March, 2012  . Pulmonary hypertension ([place])     Echo, March, 2012, 42 mmHg  . Hx of CABG   . Diabetes mellitus   . Dyslipidemia   . Hypothyroidism   . Bradycardia     Sinus bradycardia  . RBBB (right bundle branch block with left anterior fascicular block)     New July, 2010  . Leg cramps     Nighttime  . Shingles     Severe pain to right flank treated  . Ischemia     .Marland KitchenMarland KitchenEF 42%  . Hyperlipidemia   . Dizziness      12/2009 &12/ 2012  . Abnormal LFTs     LFTs were abnormal with acute MI March, 2013, improving at the time of discharge  . Myocardial  infarction ([place]) 3/19-27/2013    ARF, elevated LFTs  . Systolic CHF ([place])   . Elevated CPK     2013, now in 2014 patient on very low dose Crestor    Past Surgical History  Procedure Laterality Date  . Coronary artery bypass graft  1996    5 vessels  . Back surgery  1999    LS sx- 1999  . Spinal fusion  2000    scar tissue, plate insertion/fusion @ [facility] - 2000  . Cardiac surgery    . Cardiac catheterization  2013    occluded graft; Dr Lia Foyer  . Left heart catheterization with coronary angiogram N/A 08/03/2011    Procedure: LEFT HEART CATHETERIZATION WITH CORONARY ANGIOGRAM;  Surgeon: Hillary Bow, MD;  Location: [facility] CATH LAB;  Service: Cardiovascular;  Laterality: N/A;  . Left heart catheterization with coronary/graft angiogram N/A 02/05/2014    Procedure: LEFT HEART CATHETERIZATION WITH Beatrix Fetters;  Surgeon: Troy Sine, MD;  Location: [facility] CATH LAB;  Service: Cardiovascular;  Laterality: N/A;    Social History   Social History  . Marital Status: Married    Spouse Name: N/A  . Number of Children: N/A  . Years of Education: N/A  Occupational History  . Not on file.   Social History Main Topics  . Smoking status: Former Smoker -- 1.00 packs/day for 5 years    Quit date: 05/17/1949  . Smokeless tobacco: Never Used     Comment: Quit at age 71  . Alcohol Use: No  . Drug Use: No  . Sexual Activity: Not on file   Other Topics Concern  . Not on file   Social History Narrative    Family History  Problem Relation Age of Onset  . Diabetes Sister   . Heart attack Father 25  . Lung cancer Sister   . Stroke Neg Hx   . Diabetes Brother   . Hyperlipidemia Mother   . Hypertension Son     Allergies as of 04/14/2015 - Review Complete 04/14/2015  Allergen Reaction Noted  . Atorvastatin  09/17/2009  . Folic acid    . Nicardipine hcl    . Pravastatin  11/30/2010    Current Outpatient Prescriptions on File Prior to Visit  Medication Sig Dispense  Refill  . acetaminophen (TYLENOL) 325 MG tablet Take 650 mg by mouth every 6 (six) hours as needed for mild pain or moderate pain.     Marland Kitchen amLODipine (NORVASC) 10 MG tablet TAKE 1 TABLET BY MOUTH EVERY DAY 30 tablet 9  . aspirin EC 81 MG tablet Take 81 mg by mouth every morning.    . benazepril (LOTENSIN) 40 MG tablet TAKE 1 TABLET BY MOUTH EVERY DAY 30 tablet 2  . carvedilol (COREG) 3.125 MG tablet TAKE 1 TABLET (3.125 MG TOTAL) BY MOUTH 2 (TWO) TIMES DAILY WITH A MEAL. 60 tablet 2  . cholecalciferol (VITAMIN D) 1000 UNITS tablet Take 1,000 Units by mouth daily.     . clopidogrel (PLAVIX) 75 MG tablet TAKE 1 TABLET BY MOUTH EVERY DAY 30 tablet 6  . dorzolamide (TRUSOPT) 2 % ophthalmic solution Place 1 drop into both eyes daily.     . furosemide (LASIX) 40 MG tablet Take 20 mg by mouth daily as needed for fluid.    Marland Kitchen glipiZIDE (GLUCOTROL) 10 MG tablet Take 10 mg by mouth daily before breakfast.    . Insulin Detemir (LEVEMIR FLEXTOUCH) 100 UNIT/ML Pen INJECT 40 UNITS INTO THE SKIN AT BEDTIME. 5 pen 3  . isosorbide mononitrate (IMDUR) 30 MG 24 hr tablet TAKE 1 TABLET BY MOUTH EVERY DAY 30 tablet 5  . levothyroxine (SYNTHROID, LEVOTHROID) 50 MCG tablet Take 1 tablet (50 mcg total) by mouth daily before breakfast. 30 tablet 11  . LUMIGAN 0.01 % SOLN Place 1 drop into both eyes at bedtime.   2  . LUMIGAN 0.03 % ophthalmic solution Place into both eyes at bedtime and may repeat dose one time if needed.     . nitroGLYCERIN (NITROSTAT) 0.4 MG SL tablet Place 0.4 mg under the tongue every 5 (five) minutes as needed for chest pain.    . NONFORMULARY OR COMPOUNDED ITEM Diabetic testing supply Check glucose once daily if possible Fasting or morning glucose recommended M, W, F, & Sun if possible Goal= 100-150 Glucose 2 hours after breakfast Tue, after lunch Th & 2 hrs after eve meal Sat if possible Goal = < 180 1 each 0  . rosuvastatin (CRESTOR) 10 MG tablet Take 10 mg by mouth 2 (two) times a week. Takes on  Sundays and Wednesdays    . [DISCONTINUED] pravastatin (PRAVACHOL) 20 MG tablet Take 20 mg by mouth daily.       No current  facility-administered medications on file prior to visit.     REVIEW OF SYSTEMS: Cardiovascular: No chest pain, chest pressure, palpitations, orthopnea, or dyspnea on exertion. No claudication or rest pain,  No history of DVT or phlebitis. Pulmonary: No productive cough, asthma or wheezing. Neurologic: No weakness, paresthesias, aphasia, or amaurosis. No dizziness. Hematologic: No bleeding problems or clotting disorders. Musculoskeletal: No joint pain or joint swelling. Gastrointestinal: No blood in stool or hematemesis Genitourinary: No dysuria or hematuria. Psychiatric:: No history of major depression. Integumentary: No rashes or ulcers. Constitutional: No fever or chills.  PHYSICAL EXAMINATION:  Filed Vitals:   04/14/15 1335 04/14/15 1339  BP: 144/67 142/57  Pulse: 64   Height: '5\' 9"'$  (1.753 m)   Weight: 186 lb 8 oz (84.596 kg)   SpO2: 100%    Body mass index is 27.53 kg/(m^2). General: The patient appears their stated age.   HEENT:  No gross abnormalities Pulmonary: Respirations are non-labored Abdomen: Soft and non-tender .  No pulsatile mass Musculoskeletal: There are no major deformities.   Neurologic: No focal weakness or paresthesias are detected, Skin: There are no ulcer or rashes noted. Psychiatric: The patient has normal affect. Cardiovascular: There is a regular rate and rhythm without significant murmur appreciated. Right carotid bruit  Diagnostic Studies:  I have ordered and reviewed his carotid ultrasound.  He has greater than 80% stenosis on the right and 40-59 percent stenosis on the left. The bifurcation is in the mid neck.  The disease extends approximately 1.9 cm in length.  It is normal past the stenosis.    Assessment:   asymptomatic right carotid stenosis Plan:  I had a lengthy discussion with the patient and his wife  regarding the treatment options which include medical therapy versus stenting versus carotid endarterectomy.  We have decided to proceed with right carotid endarterectomy.  The risks and benefits of the procedure were discussed with the patient which include but are not limited to the risk of stroke, the risk of nerve injury , bleeding, and cardiac Porter complications.  The patient was to proceed in early January. I would like for him to be off his Plavix for 3 days.  He has gotten cardiac clearance from Dr. Ron Parker.     Eldridge Abrahams, M.D. [facility] Office: [phone] Pager:  [phone]

## 2015-04-15 ENCOUNTER — Telehealth: Payer: Self-pay | Admitting: Cardiology

## 2015-04-15 ENCOUNTER — Other Ambulatory Visit: Payer: Self-pay

## 2015-04-15 NOTE — Telephone Encounter (Signed)
Request for surgical clearance:  1. What type of surgery is being performed? Carotid enderterectomy  2. When is this surgery scheduled? 06/12/2015  3. Are there any medications that need to be held prior to surgery and how long? plavix for 3 days prior to procedure last dose 06/08/2015  4. Name of physician performing surgery? Dr. Tyreque Barban   5. What is your office phone and fax number? Phone [phone] Fax: [phone] Or send it back in Epic to [facility] with [facility].

## 2015-04-15 NOTE — Telephone Encounter (Signed)
Called pt to schedule an appt with Dr. Marlou Porch as recall states he is due in January. Pt said that he would like to wait for his wife to get back home to schedule as she keeps the date book. Advised pt to have her call me back once she is home.

## 2015-04-16 ENCOUNTER — Other Ambulatory Visit: Payer: Self-pay | Admitting: Internal Medicine

## 2015-04-16 NOTE — Telephone Encounter (Signed)
Per Dr. Marlou Porch, since patient's last intervention was Sept, 2015 it is ok for patient to hold Plavix for 3 days prior to surgery.  Will route to [facility].

## 2015-04-16 NOTE — Telephone Encounter (Signed)
Patient has been cleared previously by Dr. Ron Parker.  Pt needs permission to be off his Plavix x3 days prior. Will review with Dr. Marlou Porch.

## 2015-04-17 HISTORY — PX: EYE SURGERY: SHX253

## 2015-04-21 DIAGNOSIS — H2511 Age-related nuclear cataract, right eye: Secondary | ICD-10-CM | POA: Diagnosis not present

## 2015-04-28 DIAGNOSIS — H25811 Combined forms of age-related cataract, right eye: Secondary | ICD-10-CM | POA: Diagnosis not present

## 2015-04-28 DIAGNOSIS — H2511 Age-related nuclear cataract, right eye: Secondary | ICD-10-CM | POA: Diagnosis not present

## 2015-05-06 DIAGNOSIS — H2512 Age-related nuclear cataract, left eye: Secondary | ICD-10-CM | POA: Diagnosis not present

## 2015-05-14 DIAGNOSIS — H25812 Combined forms of age-related cataract, left eye: Secondary | ICD-10-CM | POA: Diagnosis not present

## 2015-05-14 DIAGNOSIS — H2512 Age-related nuclear cataract, left eye: Secondary | ICD-10-CM | POA: Diagnosis not present

## 2015-05-16 ENCOUNTER — Other Ambulatory Visit: Payer: Self-pay | Admitting: Internal Medicine

## 2015-05-21 ENCOUNTER — Other Ambulatory Visit: Payer: Self-pay | Admitting: Cardiology

## 2015-05-29 ENCOUNTER — Encounter: Payer: Self-pay | Admitting: Internal Medicine

## 2015-05-29 ENCOUNTER — Ambulatory Visit (INDEPENDENT_AMBULATORY_CARE_PROVIDER_SITE_OTHER): Payer: Medicare Other | Admitting: Internal Medicine

## 2015-05-29 ENCOUNTER — Other Ambulatory Visit (INDEPENDENT_AMBULATORY_CARE_PROVIDER_SITE_OTHER): Payer: Medicare Other | Admitting: *Deleted

## 2015-05-29 ENCOUNTER — Other Ambulatory Visit: Payer: Self-pay | Admitting: Internal Medicine

## 2015-05-29 VITALS — BP 114/64 | HR 64 | Temp 97.4°F | Resp 12 | Wt 187.8 lb

## 2015-05-29 DIAGNOSIS — E1159 Type 2 diabetes mellitus with other circulatory complications: Secondary | ICD-10-CM

## 2015-05-29 DIAGNOSIS — E1365 Other specified diabetes mellitus with hyperglycemia: Secondary | ICD-10-CM

## 2015-05-29 DIAGNOSIS — E1165 Type 2 diabetes mellitus with hyperglycemia: Secondary | ICD-10-CM

## 2015-05-29 DIAGNOSIS — IMO0002 Reserved for concepts with insufficient information to code with codable children: Secondary | ICD-10-CM

## 2015-05-29 DIAGNOSIS — E1351 Other specified diabetes mellitus with diabetic peripheral angiopathy without gangrene: Secondary | ICD-10-CM | POA: Diagnosis not present

## 2015-05-29 LAB — POCT GLYCOSYLATED HEMOGLOBIN (HGB A1C): Hemoglobin A1C: 7.6

## 2015-05-29 MED ORDER — INSULIN DETEMIR 100 UNIT/ML FLEXPEN: PEN_INJECTOR | SUBCUTANEOUS | Status: DC

## 2015-05-29 MED ORDER — PEN NEEDLES 32G X 4 MM MISC: 1.0000 | Freq: Every day | Status: DC

## 2015-05-29 NOTE — Patient Instructions (Addendum)
Please increase Levemir to 45 units at bedtime. Continue Glipizide ER 10 mg in am, before breakfast.  Check sugars 1x a day, rotating check times.  Please let me know if the sugars are consistently <80 or >200.  Please return in 3 months with your sugar log.

## 2015-05-29 NOTE — Progress Notes (Signed)
Patient ID: [name], male   DOB: Jun 29, 1925, 82 y.o.   MRN: [id number]  HPI: [name] is a 82 y.o.-year-old male, returning for follow-up for DM2, dx in 2008, insulin-dependent since 2015, uncontrolled, with complications (CAD, s/p MI and CABG in 1994, also s/p stent; CKD). Last 3 mo ago.  He will have a CEA later this month.  Last hemoglobin A1c was: Lab Results  Component Value Date   HGBA1C 7.3 02/25/2015   HGBA1C 8.5 11/21/2014   HGBA1C 9.0* 02/12/2014   Pt is on a regimen of: - Levemir 40 units at bedtime - Glipizide XL 10 mg daily Could not afford Onglyza 5 mg in a.m  Pt checks his sugars 1x a day in am and they are higher: - am: 138-141 (200, 245 - last 2 days>> 125-219, 233 >> 107-155, 169, 210 >> 140-175, 216, 223 - 2h after b'fast: n/c - before lunch: n/c - 2h after lunch: n/c >> 309 >> 340 >> 145 - before dinner: n/c >> 196, 205, 223 - 2h after dinner: n/c - bedtime: n/c - nighttime: n/c No lows. Lowest sugar was 97 >> 124 >> 107 >> 125; he has hypoglycemia awareness at 82.  Highest sugar was 295 x1 >> 375 >> 340x1 >> 223.  Pt's meals are: - Breakfast: 1 egg + [place] sausage + toast; occasionally 2 eggs - Lunch: sub or meat + veggies - Dinner: salad/vegetables + stake, sometimes starch - usually eats out or [employer] - Snacks: 2x a day: icecream He plays golf, but not lately.  - + CKD, last BUN/creatinine:  Lab Results  Component Value Date   BUN 27* 10/12/2014   CREATININE 1.42* 10/12/2014  On Benazepril. - last set of lipids: Lab Results  Component Value Date   CHOL 162 02/05/2014   HDL 38* 02/05/2014   LDLCALC 89 02/05/2014   LDLDIRECT 164.1 03/24/2012   TRIG 176* 02/05/2014   CHOLHDL 4.3 02/05/2014  On Crestor 10. - last eye exam was in 08/10/2014. Dr Bing Plume. No DR.  - no numbness and tingling in his feet.  ROS: Constitutional: no weight gain/loss, no fatigue, no subjective hyperthermia/hypothermia,no  nocturia Eyes: no blurry  vision, no xerophthalmia ENT: no sore throat, no nodules palpated in throat, no dysphagia/odynophagia, no hoarseness Cardiovascular: no CP/SOB/palpitations/leg swelling Respiratory: no cough/SOB Gastrointestinal: no N/V/D/C Musculoskeletal: no muscle/joint aches Skin: no rashes Neurological: no tremors/numbness/tingling/dizziness  I reviewed pt's medications, allergies, PMH, social hx, family hx, and changes were documented in the history of present illness. Otherwise, unchanged from my initial visit note:  Past Medical History  Diagnosis Date  . CAD (coronary artery disease)   . Hypertension   . Ejection fraction     45%, echo, March, 2012,   /  Ejection fraction after his MI March, 2013 is 45%. There is posterior basal akinesis with abnormal septal motion.  . Aortic insufficiency     Moderate, echo, March, 2012  . Mitral regurgitation     Moderate, echo, March, 2012  . Pulmonary hypertension ([facility])     Echo, March, 2012, 42 mmHg  . Hx of CABG   . Diabetes mellitus   . Dyslipidemia   . Hypothyroidism   . Bradycardia     Sinus bradycardia  . RBBB (right bundle branch block with left anterior fascicular block)     New July, 2010  . Leg cramps     Nighttime  . Shingles     Severe pain to right flank treated  .  Ischemia     .Marland KitchenMarland KitchenEF 42%  . Hyperlipidemia   . Dizziness      12/2009 &12/ 2012  . Abnormal LFTs     LFTs were abnormal with acute MI March, 2013, improving at the time of discharge  . Myocardial infarction ([place]) 3/19-27/2013    ARF, elevated LFTs  . Systolic CHF ([place])   . Elevated CPK     2013, now in 2014 patient on very low dose Crestor   Past Surgical History  Procedure Laterality Date  . Coronary artery bypass graft  1996    5 vessels  . Back surgery  1999    LS sx- 1999  . Spinal fusion  2000    scar tissue, plate insertion/fusion @ [facility] - 2000  . Cardiac surgery    . Cardiac catheterization  2013    occluded graft; Dr Lia Foyer  . Left heart  catheterization with coronary angiogram N/A 08/03/2011    Procedure: LEFT HEART CATHETERIZATION WITH CORONARY ANGIOGRAM;  Surgeon: Hillary Bow, MD;  Location: [facility] CATH LAB;  Service: Cardiovascular;  Laterality: N/A;  . Left heart catheterization with coronary/graft angiogram N/A 02/05/2014    Procedure: LEFT HEART CATHETERIZATION WITH Beatrix Fetters;  Surgeon: Troy Sine, MD;  Location: [facility] CATH LAB;  Service: Cardiovascular;  Laterality: N/A;   History   Social History  . Marital Status: Married    Spouse Name: N/A  . Children: yes   Occupational History  .  [employer]    Social History Main Topics  . Smoking status: Former Smoker -- 1.00 packs/day for 5 years    Quit date: 05/17/1949  . Smokeless tobacco: Never Used     Comment: Quit at age 53  . Alcohol Use: No  . Drug Use: No   Current Outpatient Prescriptions on File Prior to Visit  Medication Sig Dispense Refill  . acetaminophen (TYLENOL) 325 MG tablet Take 650 mg by mouth every 6 (six) hours as needed for mild pain or moderate pain.     Marland Kitchen amLODipine (NORVASC) 10 MG tablet TAKE 1 TABLET BY MOUTH EVERY DAY 30 tablet 9  . aspirin EC 81 MG tablet Take 81 mg by mouth every morning.    . benazepril (LOTENSIN) 40 MG tablet TAKE 1 TABLET BY MOUTH EVERY DAY 30 tablet 1  . carvedilol (COREG) 3.125 MG tablet TAKE 1 TABLET (3.125 MG TOTAL) BY MOUTH 2 (TWO) TIMES DAILY WITH A MEAL. 60 tablet 2  . cholecalciferol (VITAMIN D) 1000 UNITS tablet Take 1,000 Units by mouth daily.     . clopidogrel (PLAVIX) 75 MG tablet TAKE 1 TABLET BY MOUTH EVERY DAY 30 tablet 6  . dorzolamide (TRUSOPT) 2 % ophthalmic solution Place 1 drop into both eyes daily.     . furosemide (LASIX) 40 MG tablet Take 20 mg by mouth daily as needed for fluid.    Marland Kitchen glipiZIDE (GLUCOTROL XL) 5 MG 24 hr tablet TAKE 1 TABLET (5 MG TOTAL) BY MOUTH DAILY WITH BREAKFAST. 60 tablet 2  . glipiZIDE (GLUCOTROL) 10 MG tablet Take 10 mg by mouth daily before breakfast.    .  Insulin Detemir (LEVEMIR FLEXTOUCH) 100 UNIT/ML Pen INJECT 40 UNITS INTO THE SKIN AT BEDTIME. 5 pen 3  . isosorbide mononitrate (IMDUR) 30 MG 24 hr tablet TAKE 1 TABLET BY MOUTH EVERY DAY 30 tablet 5  . levothyroxine (SYNTHROID, LEVOTHROID) 50 MCG tablet Take 1 tablet (50 mcg total) by mouth daily before breakfast. 30 tablet 11  .  LUMIGAN 0.01 % SOLN Place 1 drop into both eyes at bedtime.   2  . LUMIGAN 0.03 % ophthalmic solution Place into both eyes at bedtime and may repeat dose one time if needed.     . nitroGLYCERIN (NITROSTAT) 0.4 MG SL tablet Place 0.4 mg under the tongue every 5 (five) minutes as needed for chest pain.    . NONFORMULARY OR COMPOUNDED ITEM Diabetic testing supply Check glucose once daily if possible Fasting or morning glucose recommended M, W, F, & Sun if possible Goal= 100-150 Glucose 2 hours after breakfast Tue, after lunch Th & 2 hrs after eve meal Sat if possible Goal = < 180 1 each 0  . rosuvastatin (CRESTOR) 10 MG tablet Take 10 mg by mouth 2 (two) times a week. Takes on Sundays and Wednesdays    . rosuvastatin (CRESTOR) 10 MG tablet TAKE 1 TABLET EVERY SUNDAY AND WEDNESDAY 30 tablet 0  . [DISCONTINUED] pravastatin (PRAVACHOL) 20 MG tablet Take 20 mg by mouth daily.       No current facility-administered medications on file prior to visit.   Allergies  Allergen Reactions  . Atorvastatin     REACTION: myalgias  . Folic Acid     REACTION: Face burns  . Nicardipine Hcl     REACTION: Gingival hypertrophy  . Pravastatin     myalgias   Family History  Problem Relation Age of Onset  . Diabetes Sister   . Heart attack Father 32  . Lung cancer Sister   . Stroke Neg Hx   . Diabetes Brother   . Hyperlipidemia Mother   . Hypertension Son    PE: BP 114/64 mmHg  Pulse 64  Temp(Src) 97.4 F (36.3 C) (Oral)  Resp 12  Wt 187 lb 12.8 oz (85.186 kg)  SpO2 97% Body mass index is 27.72 kg/(m^2). Wt Readings from Last 3 Encounters:  05/29/15 187 lb 12.8 oz  (85.186 kg)  04/14/15 186 lb 8 oz (84.596 kg)  02/25/15 182 lb 12.8 oz (82.918 kg)   Constitutional: normal weight, in NAD Eyes: PERRLA, EOMI, no exophthalmos ENT: moist mucous membranes, no thyromegaly, no cervical lymphadenopathy Cardiovascular: RRR,+ SEM 1/6, no RG Respiratory: CTA B Gastrointestinal: abdomen soft, NT, ND, BS+ Musculoskeletal: no deformities, strength intact in all 4 Skin: moist, warm, no rashes Neurological: no tremor with outstretched hands, DTR normal in all 4  ASSESSMENT: 1. DM2, insulin-dependent, uncontrolled, with complications - chronic kidney disease  - CAD, s/p MI, s/p CABG, s/p stent   PLAN:  1. Patient with long-standing, uncontrolled diabetes, on a basal insulin regimen + Glipizide. He could not afford the DPP4 inhibitor. Sugars are higher in the last week, in the 170-200 in am. - a good HbA1c target for him would be less than 8%, due to age and comorbidities. - I suggested to increase insulin:  Patient Instructions  Please increase Levemir to 45 units at bedtime. Continue Glipizide ER 10 mg in am, before breakfast.  Check sugars 1x a day, rotating check times.  Please let me know if the sugars are consistently <80 or >200.  Please return in 3 months with your sugar log.   - continue checking sugars at different times of the day - check 1-2 times a day, rotating checks - advised for yearly eye exams - he is up-to-date   - he prefers to have a lipid panel at next visit with PCP - will check HbA1c today >> 7.6% (higher) - Return to clinic in 3  mo with sugar log

## 2015-06-02 ENCOUNTER — Telehealth: Payer: Self-pay | Admitting: Internal Medicine

## 2015-06-02 MED ORDER — INSULIN DETEMIR 100 UNIT/ML FLEXPEN: 45.0000 [IU] | PEN_INJECTOR | Freq: Every day | SUBCUTANEOUS | Status: DC

## 2015-06-02 NOTE — Telephone Encounter (Signed)
Rx submitted per pt's request.  

## 2015-06-02 NOTE — Telephone Encounter (Signed)
Patient need another alternative for medication levemir flexpen touch, please advise  [id number] [place], [place] - [phone](Phone) [phone](Fax)

## 2015-06-05 ENCOUNTER — Encounter (HOSPITAL_COMMUNITY): Payer: Self-pay

## 2015-06-05 ENCOUNTER — Encounter (HOSPITAL_COMMUNITY)
Admission: RE | Admit: 2015-06-05 | Discharge: 2015-06-05 | Disposition: A | Discharge status: Home / Self Care | Payer: Medicare Other | Source: Ambulatory Visit | Attending: Surgery | Admitting: Surgery

## 2015-06-05 DIAGNOSIS — Z955 Presence of coronary angioplasty implant and graft: Secondary | ICD-10-CM | POA: Diagnosis not present

## 2015-06-05 DIAGNOSIS — I272 Other secondary pulmonary hypertension: Secondary | ICD-10-CM | POA: Diagnosis not present

## 2015-06-05 DIAGNOSIS — I2510 Atherosclerotic heart disease of native coronary artery without angina pectoris: Secondary | ICD-10-CM | POA: Insufficient documentation

## 2015-06-05 DIAGNOSIS — I4510 Unspecified right bundle-branch block: Secondary | ICD-10-CM | POA: Insufficient documentation

## 2015-06-05 DIAGNOSIS — Z87891 Personal history of nicotine dependence: Secondary | ICD-10-CM | POA: Diagnosis not present

## 2015-06-05 DIAGNOSIS — Z7902 Long term (current) use of antithrombotics/antiplatelets: Secondary | ICD-10-CM | POA: Insufficient documentation

## 2015-06-05 DIAGNOSIS — Z7982 Long term (current) use of aspirin: Secondary | ICD-10-CM | POA: Diagnosis not present

## 2015-06-05 DIAGNOSIS — I6523 Occlusion and stenosis of bilateral carotid arteries: Secondary | ICD-10-CM | POA: Diagnosis not present

## 2015-06-05 DIAGNOSIS — Z01818 Encounter for other preprocedural examination: Secondary | ICD-10-CM | POA: Insufficient documentation

## 2015-06-05 DIAGNOSIS — Z0183 Encounter for blood typing: Secondary | ICD-10-CM | POA: Diagnosis not present

## 2015-06-05 DIAGNOSIS — Z951 Presence of aortocoronary bypass graft: Secondary | ICD-10-CM | POA: Insufficient documentation

## 2015-06-05 DIAGNOSIS — Z794 Long term (current) use of insulin: Secondary | ICD-10-CM | POA: Diagnosis not present

## 2015-06-05 DIAGNOSIS — Z01812 Encounter for preprocedural laboratory examination: Secondary | ICD-10-CM | POA: Insufficient documentation

## 2015-06-05 DIAGNOSIS — E785 Hyperlipidemia, unspecified: Secondary | ICD-10-CM | POA: Insufficient documentation

## 2015-06-05 DIAGNOSIS — Z79899 Other long term (current) drug therapy: Secondary | ICD-10-CM | POA: Insufficient documentation

## 2015-06-05 DIAGNOSIS — I252 Old myocardial infarction: Secondary | ICD-10-CM | POA: Insufficient documentation

## 2015-06-05 DIAGNOSIS — I10 Essential (primary) hypertension: Secondary | ICD-10-CM | POA: Insufficient documentation

## 2015-06-05 DIAGNOSIS — E119 Type 2 diabetes mellitus without complications: Secondary | ICD-10-CM | POA: Insufficient documentation

## 2015-06-05 HISTORY — DX: Unspecified osteoarthritis, unspecified site: M19.90

## 2015-06-05 HISTORY — DX: Nocturia: R35.1

## 2015-06-05 HISTORY — DX: Presence of spectacles and contact lenses: Z97.3

## 2015-06-05 HISTORY — DX: Unspecified hearing loss, unspecified ear: H91.90

## 2015-06-05 LAB — TYPE AND SCREEN
ABO/RH(D): A NEG
ANTIBODY SCREEN: NEGATIVE

## 2015-06-05 LAB — URINALYSIS, ROUTINE W REFLEX MICROSCOPIC
BILIRUBIN URINE: NEGATIVE
HGB URINE DIPSTICK: NEGATIVE
KETONES UR: NEGATIVE mg/dL
LEUKOCYTES UA: NEGATIVE
Nitrite: NEGATIVE
Protein, ur: NEGATIVE mg/dL
Specific Gravity, Urine: 1.022 (ref 1.005–1.030)
pH: 5.5 (ref 5.0–8.0)

## 2015-06-05 LAB — CBC
HCT: 43 % (ref 39.0–52.0)
Hemoglobin: 14.3 g/dL (ref 13.0–17.0)
MCH: 31.2 pg (ref 26.0–34.0)
MCHC: 33.3 g/dL (ref 30.0–36.0)
MCV: 93.7 fL (ref 78.0–100.0)
PLATELETS: 173 10*3/uL (ref 150–400)
RBC: 4.59 MIL/uL (ref 4.22–5.81)
RDW: 13.2 % (ref 11.5–15.5)
WBC: 7.7 10*3/uL (ref 4.0–10.5)

## 2015-06-05 LAB — COMPREHENSIVE METABOLIC PANEL
ALT: 24 U/L (ref 17–63)
AST: 21 U/L (ref 15–41)
Albumin: 4.1 g/dL (ref 3.5–5.0)
Alkaline Phosphatase: 47 U/L (ref 38–126)
Anion gap: 8 (ref 5–15)
BUN: 22 mg/dL — AB (ref 6–20)
CHLORIDE: 110 mmol/L (ref 101–111)
CO2: 23 mmol/L (ref 22–32)
Calcium: 9.5 mg/dL (ref 8.9–10.3)
Creatinine, Ser: 1.46 mg/dL — ABNORMAL HIGH (ref 0.61–1.24)
GFR, EST AFRICAN AMERICAN: 48 mL/min — AB (ref >60)
GFR, EST NON AFRICAN AMERICAN: 41 mL/min — AB (ref >60)
Glucose, Bld: 248 mg/dL — ABNORMAL HIGH (ref 65–99)
POTASSIUM: 4.4 mmol/L (ref 3.5–5.1)
SODIUM: 141 mmol/L (ref 135–145)
Total Bilirubin: 0.5 mg/dL (ref 0.3–1.2)
Total Protein: 7.2 g/dL (ref 6.5–8.1)

## 2015-06-05 LAB — ABO/RH: ABO/RH(D): A NEG

## 2015-06-05 LAB — SURGICAL PCR SCREEN
MRSA, PCR: NEGATIVE
STAPHYLOCOCCUS AUREUS: POSITIVE — AB

## 2015-06-05 LAB — PROTIME-INR
INR: 1.08 (ref 0.00–1.49)
PROTHROMBIN TIME: 14.2 s (ref 11.6–15.2)

## 2015-06-05 LAB — APTT: aPTT: 31 seconds (ref 24–37)

## 2015-06-05 LAB — GLUCOSE, CAPILLARY: GLUCOSE-CAPILLARY: 247 mg/dL — AB (ref 65–99)

## 2015-06-05 LAB — URINE MICROSCOPIC-ADD ON: WBC, UA: NONE SEEN WBC/hpf (ref 0–5)

## 2015-06-05 NOTE — Progress Notes (Signed)
Per Colletta Maryland at Dr. Stephens Shire office, patient is to continue taking Aspirin, including the day of surgery.  Patient states that he is to stop Aspirin on Sunday, 3 days prior to surgery.

## 2015-06-05 NOTE — Progress Notes (Signed)
I called a prescription [facility]int[facility] [place], [place] 

## 2015-06-05 NOTE — Progress Notes (Signed)
PCP - Dr. Linna Darner but patient states Dr. Linna Darner has retired and he has not found a new PCP Cardiologist - Dr. Ron Parker but Dr. Ron Parker has retired.  Will now be seeing Dr. Marlou Porch  EKG - 09/2014 - Epic CXR - 09/2014 - Epic  Echo - 2015 Stress test -denies Cardiac Cath - 2015  Patient denies chest pain and shortness of breath at PAT appointment.

## 2015-06-05 NOTE — Pre-Procedure Instructions (Signed)
[name]  06/05/2015      [id number] [place] [address]: [phone]: [phone]- [place]: [phone]: [phone]   Your procedure is scheduled on Thursday, January 26th, 2017.  [facility]  Call this number if you have problems the morning of surgery:  [phone]   Remember:  Do not eat food or drink liquids after midnight.   Take these medicines the morning of surgery with A SIP OF WATER: Aspirin, Acetaminophen (Tylenol) if needed, Carvedilol (Coreg), Isosorbide Mononitrate (Imdur), Levothyroxine (Synthroid), Eye drops.  Please follow MD's instructions and stop Plavix on Sunday, January 22nd.   Please stop taking: Ibuprofen, Advil, Motrin, Aleve, Naproxen, BC's, Goody's, fish oil, all herbal medications, and all vitamins.    What do I do about my diabetes medications?   Do not take oral diabetes medicines (pills) the morning of surgery.   THE NIGHT BEFORE SURGERY, take 36 units of Levemir Insulin.    If your CBG is greater than 220 mg/dL, you may take 1/2 of your sliding scale (correction) dose of insulin.    Do not wear jewelry.  Do not wear lotions, powders, or colognes.  You may NOT wear deodorant.  Men may shave face and neck.  Do not bring valuables to the hospital.  [facility] or [facility].  Contacts, dentures or bridgework may not be worn into surgery.  Leave your suitcase in the car.  After surgery it may be brought to your room.  For patients admitted to the hospital, discharge time will be determined by your treatment team.  Patients discharged the day of surgery will not be allowed to drive home.   Special instructions:  See attached.   Please read over  the following fact sheets that you were given. Pain Booklet, Coughing and Deep Breathing, Blood Transfusion Information, MRSA Information and Surgical Site Infection Prevention    How to Manage Your Diabetes Before Surgery   Why is it important to control my blood sugar before and after surgery?   Improving blood sugar levels before and after surgery helps healing and can limit problems.  A way of improving blood sugar control is eating a healthy diet by:  - Eating less sugar and carbohydrates  - Increasing activity/exercise  - Talk with your doctor about reaching your blood sugar goals  High blood sugars (greater than 180 mg/dL) can raise your risk of infections and slow down your recovery so you will need to focus on controlling your diabetes during the weeks before surgery.  Make sure that the doctor who takes care of your diabetes knows about your planned surgery including the date and location.  How do I manage my blood sugars before surgery?   Check your blood sugar at least 4 times a day, 2 days before surgery to make sure that they are not too high or low.   Check your blood sugar the morning of your surgery when you wake up and every 2 hours until you get to the Short-Stay unit.  If your blood sugar is less than 70 mg/dL, you will need to treat for low blood sugar by:  Treat a low blood sugar (less than 70 mg/dL)  with 1/2 cup of clear juice (cranberry or apple), 4 glucose tablets, OR glucose gel.  Recheck blood sugar in 15 minutes after treatment (to make sure it is greater than 70 mg/dL).  If blood sugar is not greater than 70 mg/dL on re-check, call [phone] for further instructions.   Report your blood sugar to the Short-Stay nurse when you get to Short-Stay.  References:  [facility], 2007 "How to Manage your Diabetes Before and After Surgery".

## 2015-06-06 NOTE — Progress Notes (Signed)
Anesthesia Chart Review: Patient is a 82 year old male scheduled for right CEA on 06/12/15 by [name]. He has 80-99% RICA stenosis and 23-55% LICA stenosis by recent [place].  History includes former smoker, CAD/MI s/p CABG '96 with subsequent PCI with NSTEMI 01/2014 (no culprit lesion found but severe native disease with patent LIMA-LAD, occluded SVG-CX proximal to stent, occluded SVG-DIAG; medical therapy recommended), valvular disease (mild AR/MR by 02/06/14 echo but moderate by 02/05/14 echo), pulmonary hypertension, bradycardia, right BBB, HLD, HTN, DM2, hard of hearing, spinal fusion. There was evidence of old cerebral infarct (right posterior parietal lobe and right occipital lobe) on 10/12/14 head CT.   PCP was Dr. Linna Maxwell, who recently retired. Cardiologist is Dr. Ron Maxwell, who recently retired. He will be seeing Dr. Marlou Maxwell. He is aware of surgery plans and gave permission to hold Plavix 3 days prior to surgery. Dr. Ron Maxwell had already not recommended no further cardiac testing at his 02/05/15 visiti.  Endocrinologist is Dr. Philemon Maxwell.  Meds include amlodipine, ASA '81mg'$ , benazepril, Coreg, Plavix, Lasix, glipizide, Levemir, Imdur, levothyroxine, Nitro, Lumigan and Trusopt ophthalmic, Crestor.  PAT Vitals: BP 133/57, HR 65, RR 14, T 36.4C, O2 sat 100%. CBG 247.  10/12/14 EKG: SB at 56 bpm, first degree AVB, LAD, right BBB, septal infarct (age undetermined).  02/06/14 Echo: Study Conclusions - Left ventricle: Apical hypokinesis Poor endocardial definition even with contrast. Wall thickness was increased in a pattern of moderate LVH. Systolic function was normal. The estimated ejection fraction was in the range of 50% to 55%. - Aortic valve: There was mild regurgitation. - Mitral valve: There was mild regurgitation. - Left atrium: The atrium was mildly dilated. - Right atrium: The atrium was mildly dilated. - Atrial septum: No defect or patent foramen ovale was identified.  02/05/14  Cardiac cath: IMPRESSION: - Ischemic cardiomyopathy with an ejection fraction of 30-35%, and evidence for severe hypokinesis to akinesis of the inferior wall, hypokinesis of the distal anterolateral wall and apex with a sharp cutoff at the apex, raising the possibility of a layered thrombus. - Severe multivessel native coronary obstructive disease with total occlusion of the first diagonal branch of the LAD or ostially, 78% stenosis in the very proximal diagonal vessel, with 80% LAD stenosis before the septal perforator artery and total occlusion beyond the septal perforator; total occlusion of the intermediate vessel, total occlusion of the proximal circumflex and RCA. - Patent LIMA graft supplying the mid LAD with evidence for high percent apical LAD stenosis and evidence for collateralization to a diagonal vessel. - Occluded saphenous vein graft immediately proximal to a previously placed proximal stent which had supplied the circumflex marginal vessel. - Occlusion of the saphenous vein graft that supplied the diagonal vessel. RECOMMENDATION: Medical therapy. Consider Definity contrast echo to evaluate for possible apical thrombus.  02/05/14 Echo: Study Conclusions - Left ventricle: The cavity size was normal. Systolic function was mildly to moderately reduced. The estimated ejection fraction was in the range of 40% to 45%. There is akinesis of the inferolateral and inferior myocardium; consistent with infarction. Doppler parameters are consistent with abnormal left ventricular relaxation (grade 1 diastolic dysfunction). - Aortic valve: There was moderate regurgitation. - Aorta: Aortic root dimension: 40 mm (ED). - Mitral valve: There was moderate regurgitation. - Pulmonary artery: Systolic pressure was within the normal range.  Preoperative labs noted. Cr 1.46, appears stable since 2015. A1c on 05/29/15 was 7.6. He will get a fasting CBG on arrival.  Cardiology is aware of surgery  plans. If no acute changes then I would anticipate that he could proceed as planned.  [name] [facility] Stay [facility]/Anesthesiology Phone [phone] 06/06/2015 1:06 PM

## 2015-06-11 MED ORDER — CHLORHEXIDINE GLUCONATE CLOTH 2 % EX PADS
6.0000 | MEDICATED_PAD | Freq: Once | CUTANEOUS | Status: DC
Start: 2015-06-11 — End: 2015-06-12

## 2015-06-11 MED ORDER — CHLORHEXIDINE GLUCONATE CLOTH 2 % EX PADS: 6.0000 | MEDICATED_PAD | Freq: Once | CUTANEOUS | Status: DC

## 2015-06-11 MED ORDER — DEXTROSE 5 % IV SOLN
1.5000 g | INTRAVENOUS | Status: AC
  Administered 2015-06-12: 1.5 g via INTRAVENOUS
  Filled 2015-06-11: qty 1.5

## 2015-06-11 MED ORDER — SODIUM CHLORIDE 0.9 % IV SOLN: INTRAVENOUS | Status: DC

## 2015-06-11 NOTE — Anesthesia Preprocedure Evaluation (Addendum)
Anesthesia Evaluation  Patient identified by MRN, date of birth, ID band Patient awake    Reviewed: Allergy & Precautions, NPO status , Patient's Chart, lab work & pertinent test results  History of Anesthesia Complications Negative for: history of anesthetic complications  Airway Mallampati: III  TM Distance: >3 FB Neck ROM: Full    Dental no notable dental hx. (+) Dental Advisory Given, Poor Dentition   Pulmonary former smoker,    Pulmonary exam normal breath sounds clear to auscultation       Cardiovascular hypertension, Pt. on medications + CAD, + Past MI, + Cardiac Stents, + CABG, + Peripheral Vascular Disease and +CHF  Normal cardiovascular exam+ dysrhythmias (RBBB)  Rhythm:Regular Rate:Normal  Cleared for surgery by cardiology   Neuro/Psych negative neurological ROS  negative psych ROS   GI/Hepatic negative GI ROS, Neg liver ROS,   Endo/Other  diabetes, Insulin DependentHypothyroidism   Renal/GU negative Renal ROS  negative genitourinary   Musculoskeletal  (+) Arthritis , Osteoarthritis,    Abdominal   Peds negative pediatric ROS (+)  Hematology negative hematology ROS (+)   Anesthesia Other Findings   Reproductive/Obstetrics negative OB ROS                            Anesthesia Physical Anesthesia Plan  ASA: III  Anesthesia Plan: General   Post-op Pain Management:    Induction: Intravenous  Airway Management Planned: Oral ETT  Additional Equipment: Arterial line  Intra-op Plan:   Post-operative Plan: Extubation in OR  Informed Consent: I have reviewed the patients History and Physical, chart, labs and discussed the procedure including the risks, benefits and alternatives for the proposed anesthesia with the patient or authorized representative who has indicated his/her understanding and acceptance.   Dental advisory given  Plan Discussed with: CRNA  Anesthesia  Plan Comments:         Anesthesia Quick Evaluation

## 2015-06-12 ENCOUNTER — Encounter (HOSPITAL_COMMUNITY)
Admission: RE | Disposition: A | Discharge status: Home / Self Care | Payer: Self-pay | Source: Ambulatory Visit | Attending: Surgery

## 2015-06-12 ENCOUNTER — Inpatient Hospital Stay (HOSPITAL_COMMUNITY): Payer: Medicare Other | Admitting: Vascular Surgery

## 2015-06-12 ENCOUNTER — Encounter (HOSPITAL_COMMUNITY): Payer: Self-pay | Admitting: *Deleted

## 2015-06-12 ENCOUNTER — Inpatient Hospital Stay (HOSPITAL_COMMUNITY)
Admission: RE | Admit: 2015-06-12 | Discharge: 2015-06-14 | DRG: 038 | Disposition: A | Discharge status: Home / Self Care | Payer: Medicare Other | Source: Ambulatory Visit | Attending: Surgery | Admitting: Surgery

## 2015-06-12 ENCOUNTER — Inpatient Hospital Stay (HOSPITAL_COMMUNITY): Payer: Medicare Other | Admitting: Anesthesiology

## 2015-06-12 DIAGNOSIS — I214 Non-ST elevation (NSTEMI) myocardial infarction: Secondary | ICD-10-CM | POA: Diagnosis not present

## 2015-06-12 DIAGNOSIS — I2510 Atherosclerotic heart disease of native coronary artery without angina pectoris: Secondary | ICD-10-CM | POA: Diagnosis not present

## 2015-06-12 DIAGNOSIS — T82898A Other specified complication of vascular prosthetic devices, implants and grafts, initial encounter: Secondary | ICD-10-CM | POA: Diagnosis present

## 2015-06-12 DIAGNOSIS — Z5309 Procedure and treatment not carried out because of other contraindication: Secondary | ICD-10-CM | POA: Diagnosis not present

## 2015-06-12 DIAGNOSIS — Z8249 Family history of ischemic heart disease and other diseases of the circulatory system: Secondary | ICD-10-CM

## 2015-06-12 DIAGNOSIS — Z7984 Long term (current) use of oral hypoglycemic drugs: Secondary | ICD-10-CM | POA: Diagnosis not present

## 2015-06-12 DIAGNOSIS — Z888 Allergy status to other drugs, medicaments and biological substances status: Secondary | ICD-10-CM | POA: Diagnosis not present

## 2015-06-12 DIAGNOSIS — Z833 Family history of diabetes mellitus: Secondary | ICD-10-CM

## 2015-06-12 DIAGNOSIS — M1990 Unspecified osteoarthritis, unspecified site: Secondary | ICD-10-CM | POA: Diagnosis not present

## 2015-06-12 DIAGNOSIS — I5022 Chronic systolic (congestive) heart failure: Secondary | ICD-10-CM | POA: Diagnosis present

## 2015-06-12 DIAGNOSIS — E785 Hyperlipidemia, unspecified: Secondary | ICD-10-CM | POA: Diagnosis present

## 2015-06-12 DIAGNOSIS — I2582 Chronic total occlusion of coronary artery: Secondary | ICD-10-CM | POA: Diagnosis present

## 2015-06-12 DIAGNOSIS — Z794 Long term (current) use of insulin: Secondary | ICD-10-CM | POA: Diagnosis not present

## 2015-06-12 DIAGNOSIS — I272 Other secondary pulmonary hypertension: Secondary | ICD-10-CM | POA: Diagnosis present

## 2015-06-12 DIAGNOSIS — I452 Bifascicular block: Secondary | ICD-10-CM | POA: Diagnosis present

## 2015-06-12 DIAGNOSIS — N183 Chronic kidney disease, stage 3 (moderate): Secondary | ICD-10-CM | POA: Diagnosis present

## 2015-06-12 DIAGNOSIS — Z801 Family history of malignant neoplasm of trachea, bronchus and lung: Secondary | ICD-10-CM

## 2015-06-12 DIAGNOSIS — R072 Precordial pain: Secondary | ICD-10-CM | POA: Diagnosis not present

## 2015-06-12 DIAGNOSIS — I4510 Unspecified right bundle-branch block: Secondary | ICD-10-CM | POA: Diagnosis present

## 2015-06-12 DIAGNOSIS — I130 Hypertensive heart and chronic kidney disease with heart failure and stage 1 through stage 4 chronic kidney disease, or unspecified chronic kidney disease: Secondary | ICD-10-CM | POA: Diagnosis present

## 2015-06-12 DIAGNOSIS — Z7982 Long term (current) use of aspirin: Secondary | ICD-10-CM

## 2015-06-12 DIAGNOSIS — Z981 Arthrodesis status: Secondary | ICD-10-CM

## 2015-06-12 DIAGNOSIS — I25110 Atherosclerotic heart disease of native coronary artery with unstable angina pectoris: Secondary | ICD-10-CM | POA: Diagnosis not present

## 2015-06-12 DIAGNOSIS — E039 Hypothyroidism, unspecified: Secondary | ICD-10-CM | POA: Diagnosis present

## 2015-06-12 DIAGNOSIS — I340 Nonrheumatic mitral (valve) insufficiency: Secondary | ICD-10-CM | POA: Diagnosis present

## 2015-06-12 DIAGNOSIS — E1165 Type 2 diabetes mellitus with hyperglycemia: Secondary | ICD-10-CM | POA: Diagnosis present

## 2015-06-12 DIAGNOSIS — E1159 Type 2 diabetes mellitus with other circulatory complications: Secondary | ICD-10-CM | POA: Diagnosis not present

## 2015-06-12 DIAGNOSIS — E1122 Type 2 diabetes mellitus with diabetic chronic kidney disease: Secondary | ICD-10-CM | POA: Diagnosis present

## 2015-06-12 DIAGNOSIS — I255 Ischemic cardiomyopathy: Secondary | ICD-10-CM | POA: Diagnosis present

## 2015-06-12 DIAGNOSIS — I493 Ventricular premature depolarization: Secondary | ICD-10-CM | POA: Diagnosis not present

## 2015-06-12 DIAGNOSIS — Z87891 Personal history of nicotine dependence: Secondary | ICD-10-CM | POA: Diagnosis not present

## 2015-06-12 DIAGNOSIS — R410 Disorientation, unspecified: Secondary | ICD-10-CM | POA: Diagnosis not present

## 2015-06-12 DIAGNOSIS — H9190 Unspecified hearing loss, unspecified ear: Secondary | ICD-10-CM | POA: Diagnosis present

## 2015-06-12 DIAGNOSIS — I6529 Occlusion and stenosis of unspecified carotid artery: Secondary | ICD-10-CM | POA: Diagnosis present

## 2015-06-12 DIAGNOSIS — E7800 Pure hypercholesterolemia, unspecified: Secondary | ICD-10-CM | POA: Diagnosis present

## 2015-06-12 DIAGNOSIS — I6521 Occlusion and stenosis of right carotid artery: Secondary | ICD-10-CM | POA: Diagnosis not present

## 2015-06-12 DIAGNOSIS — I252 Old myocardial infarction: Secondary | ICD-10-CM

## 2015-06-12 DIAGNOSIS — I351 Nonrheumatic aortic (valve) insufficiency: Secondary | ICD-10-CM | POA: Diagnosis present

## 2015-06-12 DIAGNOSIS — Z7902 Long term (current) use of antithrombotics/antiplatelets: Secondary | ICD-10-CM

## 2015-06-12 DIAGNOSIS — Y832 Surgical operation with anastomosis, bypass or graft as the cause of abnormal reaction of the patient, or of later complication, without mention of misadventure at the time of the procedure: Secondary | ICD-10-CM | POA: Diagnosis present

## 2015-06-12 DIAGNOSIS — I25810 Atherosclerosis of coronary artery bypass graft(s) without angina pectoris: Secondary | ICD-10-CM | POA: Diagnosis not present

## 2015-06-12 HISTORY — PX: ENDARTERECTOMY: SHX5162

## 2015-06-12 LAB — BASIC METABOLIC PANEL
Anion gap: 9 (ref 5–15)
BUN: 22 mg/dL — AB (ref 6–20)
CHLORIDE: 107 mmol/L (ref 101–111)
CO2: 21 mmol/L — ABNORMAL LOW (ref 22–32)
CREATININE: 1.28 mg/dL — AB (ref 0.61–1.24)
Calcium: 8.7 mg/dL — ABNORMAL LOW (ref 8.9–10.3)
GFR calc Af Amer: 56 mL/min — ABNORMAL LOW (ref >60)
GFR, EST NON AFRICAN AMERICAN: 49 mL/min — AB (ref >60)
GLUCOSE: 207 mg/dL — AB (ref 65–99)
POTASSIUM: 4.3 mmol/L (ref 3.5–5.1)
Sodium: 137 mmol/L (ref 135–145)

## 2015-06-12 LAB — GLUCOSE, CAPILLARY
GLUCOSE-CAPILLARY: 134 mg/dL — AB (ref 65–99)
GLUCOSE-CAPILLARY: 187 mg/dL — AB (ref 65–99)
GLUCOSE-CAPILLARY: 223 mg/dL — AB (ref 65–99)
Glucose-Capillary: 174 mg/dL — ABNORMAL HIGH (ref 65–99)
Glucose-Capillary: 185 mg/dL — ABNORMAL HIGH (ref 65–99)

## 2015-06-12 LAB — CBC
HEMATOCRIT: 39.9 % (ref 39.0–52.0)
HEMOGLOBIN: 13.4 g/dL (ref 13.0–17.0)
MCH: 30.7 pg (ref 26.0–34.0)
MCHC: 33.6 g/dL (ref 30.0–36.0)
MCV: 91.5 fL (ref 78.0–100.0)
Platelets: 171 10*3/uL (ref 150–400)
RBC: 4.36 MIL/uL (ref 4.22–5.81)
RDW: 13 % (ref 11.5–15.5)
WBC: 12 10*3/uL — AB (ref 4.0–10.5)

## 2015-06-12 LAB — CREATININE, SERUM
Creatinine, Ser: 1.38 mg/dL — ABNORMAL HIGH (ref 0.61–1.24)
GFR, EST AFRICAN AMERICAN: 51 mL/min — AB (ref >60)
GFR, EST NON AFRICAN AMERICAN: 44 mL/min — AB (ref >60)

## 2015-06-12 LAB — MAGNESIUM: Magnesium: 2 mg/dL (ref 1.7–2.4)

## 2015-06-12 LAB — TROPONIN I
TROPONIN I: 0.2 ng/mL — AB (ref <0.031)
Troponin I: 0.03 ng/mL (ref <0.031)

## 2015-06-12 SURGERY — ENDARTERECTOMY, CAROTID
Anesthesia: General | Site: Neck | Laterality: Right

## 2015-06-12 MED ORDER — MAGNESIUM SULFATE 2 GM/50ML IV SOLN: 2.0000 g | Freq: Every day | INTRAVENOUS | Status: DC | PRN

## 2015-06-12 MED ORDER — ASPIRIN EC 81 MG PO TBEC
81.0000 mg | DELAYED_RELEASE_TABLET | Freq: Every morning | ORAL | Status: DC
  Administered 2015-06-13 – 2015-06-14 (×2): 81 mg via ORAL
  Filled 2015-06-12 (×2): qty 1

## 2015-06-12 MED ORDER — ENOXAPARIN SODIUM 40 MG/0.4ML ~~LOC~~ SOLN
40.0000 mg | SUBCUTANEOUS | Status: DC
  Administered 2015-06-13 – 2015-06-14 (×2): 40 mg via SUBCUTANEOUS
  Filled 2015-06-12 (×2): qty 0.4

## 2015-06-12 MED ORDER — MUPIROCIN 2 % EX OINT
1.0000 "application " | TOPICAL_OINTMENT | Freq: Two times a day (BID) | CUTANEOUS | Status: DC
  Administered 2015-06-12 – 2015-06-14 (×4): 1 via NASAL
  Filled 2015-06-12: qty 22

## 2015-06-12 MED ORDER — DEXTROSE 5 % IV SOLN
10.0000 mg | INTRAVENOUS | Status: DC | PRN
  Administered 2015-06-12: 40 ug/min via INTRAVENOUS

## 2015-06-12 MED ORDER — ALUM & MAG HYDROXIDE-SIMETH 200-200-20 MG/5ML PO SUSP
15.0000 mL | ORAL | Status: DC | PRN
  Administered 2015-06-12: 30 mL via ORAL
  Filled 2015-06-12: qty 30

## 2015-06-12 MED ORDER — NITROGLYCERIN 0.4 MG SL SUBL: 0.4000 mg | SUBLINGUAL_TABLET | SUBLINGUAL | Status: DC | PRN

## 2015-06-12 MED ORDER — DOCUSATE SODIUM 100 MG PO CAPS
100.0000 mg | ORAL_CAPSULE | Freq: Every day | ORAL | Status: DC
  Administered 2015-06-13 – 2015-06-14 (×2): 100 mg via ORAL
  Filled 2015-06-12 (×2): qty 1

## 2015-06-12 MED ORDER — SODIUM CHLORIDE 0.9 % IV SOLN
INTRAVENOUS | Status: DC
Start: 2015-06-12 — End: 2015-06-14
  Administered 2015-06-12 – 2015-06-13 (×4): via INTRAVENOUS

## 2015-06-12 MED ORDER — MORPHINE SULFATE (PF) 2 MG/ML IV SOLN
2.0000 mg | INTRAVENOUS | Status: DC | PRN
  Administered 2015-06-12 (×2): 2 mg via INTRAVENOUS
  Filled 2015-06-12: qty 1

## 2015-06-12 MED ORDER — LIDOCAINE HCL (PF) 1 % IJ SOLN
INTRAMUSCULAR | Status: AC
  Filled 2015-06-12: qty 30

## 2015-06-12 MED ORDER — ROSUVASTATIN CALCIUM 10 MG PO TABS: 10.0000 mg | ORAL_TABLET | Freq: Every day | ORAL | Status: DC

## 2015-06-12 MED ORDER — FENTANYL CITRATE (PF) 100 MCG/2ML IJ SOLN
INTRAMUSCULAR | Status: AC
  Filled 2015-06-12: qty 2

## 2015-06-12 MED ORDER — INSULIN ASPART 100 UNIT/ML ~~LOC~~ SOLN: 0.0000 [IU] | Freq: Three times a day (TID) | SUBCUTANEOUS | Status: DC

## 2015-06-12 MED ORDER — SENNOSIDES-DOCUSATE SODIUM 8.6-50 MG PO TABS
1.0000 | ORAL_TABLET | Freq: Every evening | ORAL | Status: DC | PRN
Start: 2015-06-12 — End: 2015-06-14

## 2015-06-12 MED ORDER — FENTANYL CITRATE (PF) 100 MCG/2ML IJ SOLN
INTRAMUSCULAR | Status: DC | PRN
  Administered 2015-06-12 (×2): 50 ug via INTRAVENOUS

## 2015-06-12 MED ORDER — 0.9 % SODIUM CHLORIDE (POUR BTL) OPTIME
TOPICAL | Status: DC | PRN
  Administered 2015-06-12: 2000 mL

## 2015-06-12 MED ORDER — GLIPIZIDE ER 5 MG PO TB24
5.0000 mg | ORAL_TABLET | Freq: Every day | ORAL | Status: DC
  Administered 2015-06-13 – 2015-06-14 (×2): 5 mg via ORAL
  Filled 2015-06-12 (×3): qty 1

## 2015-06-12 MED ORDER — BISACODYL 5 MG PO TBEC: 5.0000 mg | DELAYED_RELEASE_TABLET | Freq: Every day | ORAL | Status: DC | PRN

## 2015-06-12 MED ORDER — PHENOL 1.4 % MT LIQD
1.0000 | OROMUCOSAL | Status: DC | PRN
Start: 2015-06-12 — End: 2015-06-14
  Administered 2015-06-12: 1 via OROMUCOSAL
  Filled 2015-06-12: qty 177

## 2015-06-12 MED ORDER — PROPOFOL 10 MG/ML IV BOLUS
INTRAVENOUS | Status: AC
  Filled 2015-06-12: qty 20

## 2015-06-12 MED ORDER — GUAIFENESIN-DM 100-10 MG/5ML PO SYRP: 15.0000 mL | ORAL_SOLUTION | ORAL | Status: DC | PRN

## 2015-06-12 MED ORDER — LIDOCAINE HCL (CARDIAC) 20 MG/ML IV SOLN
INTRAVENOUS | Status: DC | PRN
  Administered 2015-06-12: 80 mg via INTRAVENOUS

## 2015-06-12 MED ORDER — METOPROLOL TARTRATE 1 MG/ML IV SOLN: 2.0000 mg | INTRAVENOUS | Status: DC | PRN

## 2015-06-12 MED ORDER — ROSUVASTATIN CALCIUM 10 MG PO TABS: 10.0000 mg | ORAL_TABLET | ORAL | Status: DC

## 2015-06-12 MED ORDER — PROPOFOL 10 MG/ML IV BOLUS
INTRAVENOUS | Status: DC | PRN
  Administered 2015-06-12: 130 mg via INTRAVENOUS

## 2015-06-12 MED ORDER — ONDANSETRON HCL 4 MG/2ML IJ SOLN: 4.0000 mg | Freq: Once | INTRAMUSCULAR | Status: DC | PRN

## 2015-06-12 MED ORDER — ONDANSETRON HCL 4 MG/2ML IJ SOLN
INTRAMUSCULAR | Status: DC | PRN
  Administered 2015-06-12: 4 mg via INTRAVENOUS

## 2015-06-12 MED ORDER — BENAZEPRIL HCL 10 MG PO TABS
40.0000 mg | ORAL_TABLET | Freq: Every day | ORAL | Status: DC
  Administered 2015-06-12: 40 mg via ORAL
  Filled 2015-06-12 (×2): qty 4

## 2015-06-12 MED ORDER — REMIFENTANIL HCL 1 MG IV SOLR
INTRAVENOUS | Status: DC | PRN
  Administered 2015-06-12: .2 ug/kg/min via INTRAVENOUS
  Administered 2015-06-12: 10:00:00 via INTRAVENOUS

## 2015-06-12 MED ORDER — CHLORHEXIDINE GLUCONATE CLOTH 2 % EX PADS
6.0000 | MEDICATED_PAD | Freq: Every day | CUTANEOUS | Status: DC
  Administered 2015-06-12 – 2015-06-14 (×3): 6 via TOPICAL

## 2015-06-12 MED ORDER — HEMOSTATIC AGENTS (NO CHARGE) OPTIME
TOPICAL | Status: DC | PRN
  Administered 2015-06-12: 1 via TOPICAL

## 2015-06-12 MED ORDER — OXYCODONE-ACETAMINOPHEN 5-325 MG PO TABS
1.0000 | ORAL_TABLET | ORAL | Status: DC | PRN
  Administered 2015-06-12: 1 via ORAL
  Administered 2015-06-12: 2 via ORAL
  Administered 2015-06-12: 1 via ORAL
  Administered 2015-06-13: 2 via ORAL
  Administered 2015-06-14: 1 via ORAL
  Administered 2015-06-14 (×2): 2 via ORAL
  Filled 2015-06-12 (×2): qty 2
  Filled 2015-06-12 (×2): qty 1
  Filled 2015-06-12: qty 2
  Filled 2015-06-12: qty 1
  Filled 2015-06-12: qty 2

## 2015-06-12 MED ORDER — FUROSEMIDE 20 MG PO TABS: 20.0000 mg | ORAL_TABLET | Freq: Every day | ORAL | Status: DC | PRN

## 2015-06-12 MED ORDER — MORPHINE SULFATE (PF) 2 MG/ML IV SOLN
INTRAVENOUS | Status: AC
  Filled 2015-06-12: qty 1

## 2015-06-12 MED ORDER — CLOPIDOGREL BISULFATE 75 MG PO TABS
75.0000 mg | ORAL_TABLET | Freq: Every day | ORAL | Status: DC
  Administered 2015-06-12 – 2015-06-14 (×3): 75 mg via ORAL
  Filled 2015-06-12 (×3): qty 1

## 2015-06-12 MED ORDER — FENTANYL CITRATE (PF) 250 MCG/5ML IJ SOLN
INTRAMUSCULAR | Status: AC
  Filled 2015-06-12: qty 5

## 2015-06-12 MED ORDER — DEXTROSE 5 % IV SOLN
1.5000 g | Freq: Two times a day (BID) | INTRAVENOUS | Status: AC
  Administered 2015-06-12 – 2015-06-13 (×2): 1.5 g via INTRAVENOUS
  Filled 2015-06-12 (×2): qty 1.5

## 2015-06-12 MED ORDER — HEPARIN SODIUM (PORCINE) 1000 UNIT/ML IJ SOLN
INTRAMUSCULAR | Status: DC | PRN
  Administered 2015-06-12: 9 mL via INTRAVENOUS

## 2015-06-12 MED ORDER — LABETALOL HCL 5 MG/ML IV SOLN: 10.0000 mg | INTRAVENOUS | Status: DC | PRN

## 2015-06-12 MED ORDER — SODIUM CHLORIDE 0.9 % IV SOLN: 500.0000 mL | Freq: Once | INTRAVENOUS | Status: DC | PRN

## 2015-06-12 MED ORDER — INSULIN ASPART 100 UNIT/ML ~~LOC~~ SOLN
0.0000 [IU] | Freq: Three times a day (TID) | SUBCUTANEOUS | Status: DC
  Administered 2015-06-12: 3 [IU] via SUBCUTANEOUS
  Administered 2015-06-12: 2 [IU] via SUBCUTANEOUS
  Administered 2015-06-13: 3 [IU] via SUBCUTANEOUS

## 2015-06-12 MED ORDER — SODIUM CHLORIDE 0.9 % IV SOLN
0.0125 ug/kg/min | INTRAVENOUS | Status: DC
  Filled 2015-06-12: qty 2000

## 2015-06-12 MED ORDER — SODIUM CHLORIDE 0.9 % IJ SOLN
INTRAMUSCULAR | Status: AC
  Filled 2015-06-12: qty 10

## 2015-06-12 MED ORDER — LATANOPROST 0.005 % OP SOLN
1.0000 [drp] | Freq: Every day | OPHTHALMIC | Status: DC
  Administered 2015-06-12 – 2015-06-13 (×2): 1 [drp] via OPHTHALMIC
  Filled 2015-06-12: qty 2.5

## 2015-06-12 MED ORDER — PROTAMINE SULFATE 10 MG/ML IV SOLN
INTRAVENOUS | Status: DC | PRN
  Administered 2015-06-12: 50 mg via INTRAVENOUS

## 2015-06-12 MED ORDER — HEPARIN SODIUM (PORCINE) 1000 UNIT/ML IJ SOLN
INTRAMUSCULAR | Status: AC
  Filled 2015-06-12: qty 1

## 2015-06-12 MED ORDER — SUGAMMADEX SODIUM 200 MG/2ML IV SOLN
INTRAVENOUS | Status: DC | PRN
  Administered 2015-06-12: 200 mg via INTRAVENOUS

## 2015-06-12 MED ORDER — ACETAMINOPHEN 325 MG PO TABS
650.0000 mg | ORAL_TABLET | Freq: Four times a day (QID) | ORAL | Status: DC | PRN
  Administered 2015-06-12: 650 mg via ORAL
  Filled 2015-06-12: qty 2

## 2015-06-12 MED ORDER — LIDOCAINE HCL (CARDIAC) 20 MG/ML IV SOLN
INTRAVENOUS | Status: AC
  Filled 2015-06-12: qty 5

## 2015-06-12 MED ORDER — SUCCINYLCHOLINE CHLORIDE 20 MG/ML IJ SOLN
INTRAMUSCULAR | Status: AC
  Filled 2015-06-12: qty 1

## 2015-06-12 MED ORDER — INSULIN DETEMIR 100 UNIT/ML ~~LOC~~ SOLN
45.0000 [IU] | Freq: Every day | SUBCUTANEOUS | Status: DC
  Administered 2015-06-12 – 2015-06-13 (×2): 45 [IU] via SUBCUTANEOUS
  Filled 2015-06-12 (×3): qty 0.45

## 2015-06-12 MED ORDER — POTASSIUM CHLORIDE CRYS ER 20 MEQ PO TBCR
20.0000 meq | EXTENDED_RELEASE_TABLET | Freq: Every day | ORAL | Status: DC | PRN
Start: 2015-06-12 — End: 2015-06-14

## 2015-06-12 MED ORDER — LEVOTHYROXINE SODIUM 50 MCG PO TABS
50.0000 ug | ORAL_TABLET | Freq: Every day | ORAL | Status: DC
Start: 2015-06-13 — End: 2015-06-14
  Administered 2015-06-13 – 2015-06-14 (×2): 50 ug via ORAL
  Filled 2015-06-12 (×2): qty 1

## 2015-06-12 MED ORDER — EPHEDRINE SULFATE 50 MG/ML IJ SOLN
INTRAMUSCULAR | Status: AC
  Filled 2015-06-12: qty 1

## 2015-06-12 MED ORDER — HYDRALAZINE HCL 20 MG/ML IJ SOLN: 5.0000 mg | INTRAMUSCULAR | Status: DC | PRN

## 2015-06-12 MED ORDER — ROCURONIUM BROMIDE 100 MG/10ML IV SOLN
INTRAVENOUS | Status: DC | PRN
  Administered 2015-06-12: 40 mg via INTRAVENOUS
  Administered 2015-06-12 (×4): 10 mg via INTRAVENOUS

## 2015-06-12 MED ORDER — PANTOPRAZOLE SODIUM 40 MG PO TBEC
40.0000 mg | DELAYED_RELEASE_TABLET | Freq: Every day | ORAL | Status: DC
Start: 2015-06-12 — End: 2015-06-14
  Administered 2015-06-12 – 2015-06-14 (×3): 40 mg via ORAL
  Filled 2015-06-12 (×3): qty 1

## 2015-06-12 MED ORDER — DORZOLAMIDE HCL 2 % OP SOLN
1.0000 [drp] | Freq: Every day | OPHTHALMIC | Status: DC
  Administered 2015-06-13 – 2015-06-14 (×2): 1 [drp] via OPHTHALMIC
  Filled 2015-06-12: qty 10

## 2015-06-12 MED ORDER — ISOSORBIDE MONONITRATE ER 30 MG PO TB24
30.0000 mg | ORAL_TABLET | Freq: Every day | ORAL | Status: DC
  Administered 2015-06-13 – 2015-06-14 (×2): 30 mg via ORAL
  Filled 2015-06-12 (×2): qty 1

## 2015-06-12 MED ORDER — ROCURONIUM BROMIDE 50 MG/5ML IV SOLN
INTRAVENOUS | Status: AC
  Filled 2015-06-12: qty 1

## 2015-06-12 MED ORDER — HEPARIN SODIUM (PORCINE) 5000 UNIT/ML IJ SOLN
INTRAMUSCULAR | Status: DC | PRN
  Administered 2015-06-12: 500 mL

## 2015-06-12 MED ORDER — PHENYLEPHRINE 40 MCG/ML (10ML) SYRINGE FOR IV PUSH (FOR BLOOD PRESSURE SUPPORT)
PREFILLED_SYRINGE | INTRAVENOUS | Status: AC
  Filled 2015-06-12: qty 10

## 2015-06-12 MED ORDER — VITAMIN D 1000 UNITS PO TABS
1000.0000 [IU] | ORAL_TABLET | Freq: Every day | ORAL | Status: DC
  Administered 2015-06-12 – 2015-06-14 (×3): 1000 [IU] via ORAL
  Filled 2015-06-12 (×3): qty 1

## 2015-06-12 MED ORDER — SUGAMMADEX SODIUM 200 MG/2ML IV SOLN
INTRAVENOUS | Status: AC
  Filled 2015-06-12: qty 2

## 2015-06-12 MED ORDER — AMLODIPINE BESYLATE 10 MG PO TABS
10.0000 mg | ORAL_TABLET | Freq: Every day | ORAL | Status: DC
  Administered 2015-06-12 – 2015-06-14 (×3): 10 mg via ORAL
  Filled 2015-06-12 (×3): qty 1

## 2015-06-12 MED ORDER — INSULIN DETEMIR 100 UNIT/ML FLEXPEN: 45.0000 [IU] | PEN_INJECTOR | Freq: Every day | SUBCUTANEOUS | Status: DC

## 2015-06-12 MED ORDER — LACTATED RINGERS IV SOLN
INTRAVENOUS | Status: DC | PRN
  Administered 2015-06-12: 07:00:00 via INTRAVENOUS

## 2015-06-12 MED ORDER — EPHEDRINE SULFATE 50 MG/ML IJ SOLN
INTRAMUSCULAR | Status: DC | PRN
  Administered 2015-06-12 (×3): 10 mg via INTRAVENOUS

## 2015-06-12 MED ORDER — ONDANSETRON HCL 4 MG/2ML IJ SOLN: 4.0000 mg | Freq: Four times a day (QID) | INTRAMUSCULAR | Status: DC | PRN

## 2015-06-12 MED ORDER — FENTANYL CITRATE (PF) 100 MCG/2ML IJ SOLN
25.0000 ug | INTRAMUSCULAR | Status: DC | PRN
  Administered 2015-06-12 (×4): 25 ug via INTRAVENOUS

## 2015-06-12 MED ORDER — CARVEDILOL 3.125 MG PO TABS
3.1250 mg | ORAL_TABLET | Freq: Two times a day (BID) | ORAL | Status: DC
  Administered 2015-06-12 – 2015-06-14 (×4): 3.125 mg via ORAL
  Filled 2015-06-12 (×4): qty 1

## 2015-06-12 MED ORDER — ONDANSETRON HCL 4 MG/2ML IJ SOLN
INTRAMUSCULAR | Status: AC
  Filled 2015-06-12: qty 2

## 2015-06-12 SURGICAL SUPPLY — 47 items
CANISTER SUCTION 2500CC (MISCELLANEOUS) ×3 IMPLANT
CATH ROBINSON RED A/P 18FR (CATHETERS) ×3 IMPLANT
CATH SUCT 10FR WHISTLE TIP (CATHETERS) ×3 IMPLANT
CLIP TI MEDIUM 6 (CLIP) ×3 IMPLANT
CLIP TI WIDE RED SMALL 6 (CLIP) ×3 IMPLANT
CRADLE DONUT ADULT HEAD (MISCELLANEOUS) ×3 IMPLANT
DRAIN CHANNEL 15F RND FF W/TCR (WOUND CARE) IMPLANT
ELECT REM PT RETURN 9FT ADLT (ELECTROSURGICAL) ×3
ELECTRODE REM PT RTRN 9FT ADLT (ELECTROSURGICAL) ×1 IMPLANT
EVACUATOR SILICONE 100CC (DRAIN) IMPLANT
GAUZE SPONGE 4X4 12PLY STRL (GAUZE/BANDAGES/DRESSINGS) ×3 IMPLANT
GLOVE BIO SURGEON STRL SZ 6.5 (GLOVE) ×2 IMPLANT
GLOVE BIO SURGEONS STRL SZ 6.5 (GLOVE) ×1
GLOVE BIOGEL PI IND STRL 7.5 (GLOVE) ×2 IMPLANT
GLOVE BIOGEL PI INDICATOR 7.5 (GLOVE) ×4
GLOVE ECLIPSE 7.0 STRL STRAW (GLOVE) ×3 IMPLANT
GLOVE SURG SS PI 7.5 STRL IVOR (GLOVE) ×3 IMPLANT
GOWN STRL REUS W/ TWL LRG LVL3 (GOWN DISPOSABLE) ×2 IMPLANT
GOWN STRL REUS W/ TWL XL LVL3 (GOWN DISPOSABLE) ×1 IMPLANT
GOWN STRL REUS W/TWL LRG LVL3 (GOWN DISPOSABLE) ×4
GOWN STRL REUS W/TWL XL LVL3 (GOWN DISPOSABLE) ×2
HEMOSTAT SNOW SURGICEL 2X4 (HEMOSTASIS) ×3 IMPLANT
INSERT FOGARTY SM (MISCELLANEOUS) IMPLANT
KIT BASIN OR (CUSTOM PROCEDURE TRAY) ×3 IMPLANT
KIT ROOM TURNOVER OR (KITS) ×3 IMPLANT
LIQUID BAND (GAUZE/BANDAGES/DRESSINGS) ×3 IMPLANT
NEEDLE HYPO 25GX1X1/2 BEV (NEEDLE) IMPLANT
NS IRRIG 1000ML POUR BTL (IV SOLUTION) ×9 IMPLANT
PACK CAROTID (CUSTOM PROCEDURE TRAY) ×3 IMPLANT
PAD ARMBOARD 7.5X6 YLW CONV (MISCELLANEOUS) ×6 IMPLANT
PATCH VASC XENOSURE 1CMX6CM (Vascular Products) ×2 IMPLANT
PATCH VASC XENOSURE 1X6 (Vascular Products) ×1 IMPLANT
SHUNT CAROTID BYPASS 10 (VASCULAR PRODUCTS) IMPLANT
SHUNT CAROTID BYPASS 12FRX15.5 (VASCULAR PRODUCTS) IMPLANT
SPONGE INTESTINAL PEANUT (DISPOSABLE) ×3 IMPLANT
SUT ETHILON 3 0 PS 1 (SUTURE) IMPLANT
SUT PROLENE 5 0 C 1 24 (SUTURE) ×3 IMPLANT
SUT PROLENE 6 0 BV (SUTURE) ×3 IMPLANT
SUT PROLENE 7 0 BV 1 (SUTURE) IMPLANT
SUT PROLENE 7 0 BV1 MDA (SUTURE) ×3 IMPLANT
SUT SILK 3 0 TIES 17X18 (SUTURE)
SUT SILK 3-0 18XBRD TIE BLK (SUTURE) IMPLANT
SUT VIC AB 3-0 SH 27 (SUTURE) ×4
SUT VIC AB 3-0 SH 27X BRD (SUTURE) ×2 IMPLANT
SUT VICRYL 4-0 PS2 18IN ABS (SUTURE) ×3 IMPLANT
SYR CONTROL 10ML LL (SYRINGE) IMPLANT
WATER STERILE IRR 1000ML POUR (IV SOLUTION) ×3 IMPLANT

## 2015-06-12 NOTE — Progress Notes (Signed)
Pharmacy Consult: Antibiotics renal dose adjustment  22 YOM s/p carotid endarterectomy, getting zinacef for post-op prophylaxis. Pharmacy is consulted for antibiotics renal dose adjustment. Scr 1.46 on 1/19, est. crcl 35 ml/min. He received pre-op dose at 0746.  Plan: Zinacef 1.5 g IV Q 12 hrs x 2 doses as ordered, no adjustment needed. Pharmacy sign off.   Thanks.  Maryanna Shape, PharmD, BCPS  Clinical Pharmacist  Pager: [phone]

## 2015-06-12 NOTE — Transfer of Care (Signed)
Immediate Anesthesia Transfer of Care Note  Patient: [name]  Procedure(s) Performed: Procedure(s): ENDARTERECTOMY CAROTID WITH PATCH ANGIOPLASTY (Right)  Patient Location: PACU  Anesthesia Type:General  Level of Consciousness: awake, alert , oriented and patient cooperative  Airway & Oxygen Therapy: Patient Spontanous Breathing and Patient connected to nasal cannula oxygen  Post-op Assessment: Report given to RN and Post -op Vital signs reviewed and stable  Post vital signs: Reviewed and stable  Last Vitals:  Filed Vitals:   06/12/15 0621  BP: 146/75  Pulse: 63  Temp: 36.8 C  Resp: 16    Complications: No apparent anesthesia complications

## 2015-06-12 NOTE — Care Management Note (Signed)
Case Management Note  Patient Details  Name: [name] MRN: [id number] Date of Birth: Oct 01, 1925  Subjective/Objective:    Date:06/12/15 Spoke with patient at the bedside .  Introduced self as Tourist information centre manager and explained role in discharge planning and how to be reached.  Verified patient lives in town, with spouse. Expressed potential need for no other DME.  Verified patient anticipates to go home with family, at time of discharge and will have full-time supervision by family at this time to best of their knowledge. Patient denied needing help with their medication.  Patient drives or  is driven by wife to MD appointments.  Verified patient has PCP Unice Cobble.     Plan: CM will continue to follow for discharge planning and [facility] resources.                 Action/Plan:   Expected Discharge Date:                  Expected Discharge Plan:  Home/Self Care  In-House Referral:     Discharge planning Services  CM Consult  Post Acute Care Choice:    Choice offered to:     DME Arranged:    DME Agency:     HH Arranged:    [place] Agency:     Status of Service:  Completed, signed off  Medicare Important Message Given:    Date Medicare IM Given:    Medicare IM give by:    Date Additional Medicare IM Given:    Additional Medicare Important Message give by:     If discussed at [place] of Stay Meetings, dates discussed:    Additional Comments:  Zenon Mayo, RN 06/12/2015, 3:04 PM

## 2015-06-12 NOTE — Progress Notes (Signed)
     Patient was complaining of chest pain EKG was performed and showed no changes he has a known Bundle branch block and history of CABG and stenting.  Dr. Bridgett Larsson was notified and told me to call in a Cardiology consult.  Dr. Ellyn Hack agreed to see him.  We drew cardiac enzymes.    He currently has no chest pain was placed on a clear diet due to complaints of swallowing pain and indigestion.   His grip in grossly equal bilaterally UE, no vision changes, no tongue deviation and smile is symmetric.  S/P right CEA  Sou Nohr MAUREEN PA-C

## 2015-06-12 NOTE — Progress Notes (Signed)
Patient c/o chest pain that radiates down left arm. Unable to state when pain started, vague on when symptoms started. Questioned on description of pain and unable to describe, asked if it felt like pressure or if having any numbness and stated yes to both. Obtained EKG on patient. Patient then made statement that after he ate lunch he felt he was having some indigestion and then chest pain. He then made the statement that he was no longer having chest pain. Maudie Mercury, PA with [facility] paged. New orders obtained. Will cont to monitor.

## 2015-06-12 NOTE — H&P (Signed)
Patient name: [name]: [id number] DOB: [birth year]Sex: male   Referred by: Dr. Ron Parker  Reason for referral:  Chief Complaint  Patient presents with  . New Evaluation    eval RICA stenosis - c/o pain R neck X 3 months    HISTORY OF PRESENT ILLNESS: this is an 82 year old gentleman who comes in today for evaluation of carotid disease. He has had a carotid bruit detected which ultimately led to serial ultrasounds. His most recent ultrasound showed greater than 80% right carotid stenosis. The patient remains asymptomatic. Specifically he denies numbness or weakness in either extremity. He denies slurred speech. He denies amaurosis fugax.   Patient suffers from coronary artery disease. He is status post CABG and stenting. He is on dual antiplatelet therapy. He is medically managed for hypertension. He is on ACE inhibitor. He suffers from hypercholesterolemia for which he takes a statin. Patient is also a diabetic. His most recent hemoglobin A1c is 7.3.  Past Medical History  Diagnosis Date  . CAD (coronary artery disease)   . Hypertension   . Ejection fraction     45%, echo, March, 2012, / Ejection fraction after his MI March, 2013 is 45%. There is posterior basal akinesis with abnormal septal motion.  . Aortic insufficiency     Moderate, echo, March, 2012  . Mitral regurgitation     Moderate, echo, March, 2012  . Pulmonary hypertension ([place])     Echo, March, 2012, 42 mmHg  . Hx of CABG   . Diabetes mellitus   . Dyslipidemia   . Hypothyroidism   . Bradycardia     Sinus bradycardia  . RBBB (right bundle branch block with left anterior fascicular block)     New July, 2010  . Leg cramps     Nighttime  . Shingles     Severe pain to right flank treated  . Ischemia     .Marland KitchenMarland KitchenEF 42%  . Hyperlipidemia   . Dizziness     12/2009 &12/ 2012  .  Abnormal LFTs     LFTs were abnormal with acute MI March, 2013, improving at the time of discharge  . Myocardial infarction ([place]) 3/19-27/2013    ARF, elevated LFTs  . Systolic CHF ([place])   . Elevated CPK     2013, now in 2014 patient on very low dose Crestor    Past Surgical History  Procedure Laterality Date  . Coronary artery bypass graft  1996    5 vessels  . Back surgery  1999    LS sx- 1999  . Spinal fusion  2000    scar tissue, plate insertion/fusion @ LS spine - 2000  . Cardiac surgery    . Cardiac catheterization  2013    occluded graft; Dr Lia Foyer  . Left heart catheterization with coronary angiogram N/A 08/03/2011    Procedure: LEFT HEART CATHETERIZATION WITH CORONARY ANGIOGRAM; Surgeon: Hillary Bow, MD; Location: [facility] CATH LAB; Service: Cardiovascular; Laterality: N/A;  . Left heart catheterization with coronary/graft angiogram N/A 02/05/2014    Procedure: LEFT HEART CATHETERIZATION WITH Beatrix Fetters; Surgeon: Troy Sine, MD; Location: [facility] CATH LAB; Service: Cardiovascular; Laterality: N/A;    Social History   Social History  . Marital Status: Married    Spouse Name: N/A  . Number of Children: N/A  . Years of Education: N/A   Occupational History  . Not on file.   Social History Main Topics  . Smoking status: Former Smoker -- 1.00 packs/day for 5  years    Quit date: 05/17/1949  . Smokeless tobacco: Never Used     Comment: Quit at age 33  . Alcohol Use: No  . Drug Use: No  . Sexual Activity: Not on file   Other Topics Concern  . Not on file   Social History Narrative    Family History  Problem Relation Age of Onset  . Diabetes Sister   . Heart attack Father 66  . Lung cancer Sister   . Stroke Neg Hx   . Diabetes Brother   . Hyperlipidemia Mother   . Hypertension  Son     Allergies as of 04/14/2015 - Review Complete 04/14/2015  Allergen Reaction Noted  . Atorvastatin  09/17/2009  . Folic acid    . Nicardipine hcl    . Pravastatin  11/30/2010    Current Outpatient Prescriptions on File Prior to Visit  Medication Sig Dispense Refill  . acetaminophen (TYLENOL) 325 MG tablet Take 650 mg by mouth every 6 (six) hours as needed for mild pain or moderate pain.     Marland Kitchen amLODipine (NORVASC) 10 MG tablet TAKE 1 TABLET BY MOUTH EVERY DAY 30 tablet 9  . aspirin EC 81 MG tablet Take 81 mg by mouth every morning.    . benazepril (LOTENSIN) 40 MG tablet TAKE 1 TABLET BY MOUTH EVERY DAY 30 tablet 2  . carvedilol (COREG) 3.125 MG tablet TAKE 1 TABLET (3.125 MG TOTAL) BY MOUTH 2 (TWO) TIMES DAILY WITH A MEAL. 60 tablet 2  . cholecalciferol (VITAMIN D) 1000 UNITS tablet Take 1,000 Units by mouth daily.     . clopidogrel (PLAVIX) 75 MG tablet TAKE 1 TABLET BY MOUTH EVERY DAY 30 tablet 6  . dorzolamide (TRUSOPT) 2 % ophthalmic solution Place 1 drop into both eyes daily.     . furosemide (LASIX) 40 MG tablet Take 20 mg by mouth daily as needed for fluid.    Marland Kitchen glipiZIDE (GLUCOTROL) 10 MG tablet Take 10 mg by mouth daily before breakfast.    . Insulin Detemir (LEVEMIR FLEXTOUCH) 100 UNIT/ML Pen INJECT 40 UNITS INTO THE SKIN AT BEDTIME. 5 pen 3  . isosorbide mononitrate (IMDUR) 30 MG 24 hr tablet TAKE 1 TABLET BY MOUTH EVERY DAY 30 tablet 5  . levothyroxine (SYNTHROID, LEVOTHROID) 50 MCG tablet Take 1 tablet (50 mcg total) by mouth daily before breakfast. 30 tablet 11  . LUMIGAN 0.01 % SOLN Place 1 drop into both eyes at bedtime.   2  . LUMIGAN 0.03 % ophthalmic solution Place into both eyes at bedtime and may repeat dose one time if needed.     . nitroGLYCERIN (NITROSTAT) 0.4 MG SL tablet Place 0.4 mg under the tongue every 5 (five) minutes as needed for chest pain.     . NONFORMULARY OR COMPOUNDED ITEM Diabetic testing supply Check glucose once daily if possible Fasting or morning glucose recommended M, W, F, & Sun if possible Goal= 100-150 Glucose 2 hours after breakfast Tue, after lunch Th & 2 hrs after eve meal Sat if possible Goal = < 180 1 each 0  . rosuvastatin (CRESTOR) 10 MG tablet Take 10 mg by mouth 2 (two) times a week. Takes on Sundays and Wednesdays    . [DISCONTINUED] pravastatin (PRAVACHOL) 20 MG tablet Take 20 mg by mouth daily.      No current facility-administered medications on file prior to visit.     REVIEW OF SYSTEMS: Cardiovascular: No chest pain, chest pressure, palpitations, orthopnea, or dyspnea on  exertion. No claudication or rest pain, No history of DVT or phlebitis. Pulmonary: No productive cough, asthma or wheezing. Neurologic: No weakness, paresthesias, aphasia, or amaurosis. No dizziness. Hematologic: No bleeding problems or clotting disorders. Musculoskeletal: No joint pain or joint swelling. Gastrointestinal: No blood in stool or hematemesis Genitourinary: No dysuria or hematuria. Psychiatric:: No history of major depression. Integumentary: No rashes or ulcers. Constitutional: No fever or chills.  PHYSICAL EXAMINATION:  Filed Vitals:   04/14/15 1335 04/14/15 1339  BP: 144/67 142/57  Pulse: 64   Height: '5\' 9"'$  (1.753 m)   Weight: 186 lb 8 oz (84.596 kg)   SpO2: 100%    Body mass index is 27.53 kg/(m^2). General: The patient appears their stated age.  HEENT: No gross abnormalities Pulmonary: Respirations are non-labored Abdomen: Soft and non-tender . No pulsatile mass Musculoskeletal: There are no major deformities.  Neurologic: No focal weakness or paresthesias are detected, Skin: There are no ulcer or rashes noted. Psychiatric: The patient has normal affect. Cardiovascular: There is a regular rate and rhythm without significant murmur appreciated. Right  carotid bruit  Diagnostic Studies: I have ordered and reviewed his carotid ultrasound. He has greater than 80% stenosis on the right and 40-59 percent stenosis on the left. The bifurcation is in the mid neck. The disease extends approximately 1.9 cm in length. It is normal past the stenosis.    Assessment:  asymptomatic right carotid stenosis Plan: I had a lengthy discussion with the patient and his wife regarding the treatment options which include medical therapy versus stenting versus carotid endarterectomy. We have decided to proceed with right carotid endarterectomy. The risks and benefits of the procedure were discussed with the patient which include but are not limited to the risk of stroke, the risk of nerve injury , bleeding, and cardiac Porter complications. The patient was to proceed in early January. I would like for him to be off his Plavix for 3 days. He has gotten cardiac clearance from Dr. Ron Parker.     Eldridge Abrahams, M.D. [facility] Office: [phone] Pager: [phone]       No changes CVRRRR CTAB Neuro intact  Plan R CEA  WB

## 2015-06-12 NOTE — Progress Notes (Addendum)
  [facility] Day of [facility] Note  Subjective:  Having mild headache. Sore throat. When swallowing applesauce, he felt that it "slowed down" in middle of throat. No coughing with eating.   Filed Vitals:   06/12/15 1200 06/12/15 1300  BP: 137/78 156/86  Pulse: 99 111  Temp:    Resp: 11 24    Right neck incision c/d/i. No hematoma. No smile asymmetry. Tongue is midline. 5/5 strength upper and lower extremities.   Assessment/Plan:  This is a 82 y.o. male who is s/p right carotid endarterectomy  Neuro exam intact.  Tylenol for headache.  Some swallowing discomfort. Discussed with patient and will continue clears tonight. Advance tomorrow if tolerating clears.  Neck without hematoma.    Virgina Jock, [place] Pager: [phone] 06/12/2015 2:38 PM   Neuro intact, s/p R CEA  Annamarie Major

## 2015-06-12 NOTE — Consult Note (Signed)
CARDIOLOGY CONSULT NOTE   Patient ID: [name] MRN: [id number] DOB/AGE: 16-Aug-1925 82 y.o.  Admit date: 06/12/2015  Primary Physician   Unice Cobble, MD Primary Cardiologist   Dr Ron Parker >> Dr Marlou Porch Reason for Consultation   Chest pain Requesting Physician Dr Trula Slade  HPI: [name] is a 82 y.o. male with a history of DM, CABG 1994, mod AI/MR by echo w/ EF 50-55% by echo 01/2014, hypothyroid, w/ patent LIMA to LAD, a known occluded vein graft to diagonal, and new occlusion of the vein graft to the marginal with extensive thrombus by cath 2013. Cardiac catheterization 2015 is below and medical therapy was recommended.  [name] was admitted today for right carotid endarterectomy. He was successfully extubated and his neurologic exam was unchanged.  Approximately 2 PM today, after the surgery, he had a meal. As he was eating, he was having throat pain. After he ate, he developed pinpoint midsternal chest pain, dull, 4/10. It was not associated with shortness of breath, diaphoresis, or nausea and vomiting. It was not worse with deep inspiration or position. At first, there was concern for left arm radiation, but that turned out to be localized pain from his IV site attempts. They tried Mylanta for his pain. His symptoms then resolved. The pain has not been returned. Currently, he is conscious and alert and resting comfortably. He is recovering well from the surgery.  The patient states this did not feel like his previous anginal/MI symptoms.   Past Medical History  Diagnosis Date  . CAD (coronary artery disease)   . Hypertension   . Ejection fraction     45%, echo, March, 2012,   /  Ejection fraction after his MI March, 2013 is 45%. There is posterior basal akinesis with abnormal septal motion.  . Aortic insufficiency     Moderate, echo, March, 2012  . Mitral regurgitation     Moderate, echo, March, 2012  . Pulmonary hypertension ([place])     Echo, March, 2012, 42 mmHg    . Hx of CABG   . Diabetes mellitus   . Dyslipidemia   . Hypothyroidism   . Bradycardia     Sinus bradycardia  . RBBB (right bundle branch block with left anterior fascicular block)     New July, 2010  . Leg cramps     history of nighttime  . Shingles October 2011    Severe pain to right flank treated  . Ischemia     .Marland KitchenMarland KitchenEF 42%  . Hyperlipidemia   . Dizziness      12/2009 & 12/ 2012  . Abnormal LFTs     LFTs were abnormal with acute MI March, 2013, improving at the time of discharge  . Myocardial infarction ([place]) 3/19-27/2013    ARF, elevated LFTs  . Systolic CHF ([place])   . Elevated CPK     2013, now in 2014 patient on very low dose Crestor  . Hard of hearing   . Nocturia   . Wears glasses   . Arthritis     hands     Past Surgical History  Procedure Laterality Date  . Coronary artery bypass graft  1996    5 vessels  . Back surgery  1999    LS sx- 1999  . Spinal fusion  2000    scar tissue, plate insertion/fusion @ [facility] spine - 2000  . Cardiac surgery    . Cardiac catheterization  2013    occluded graft; Dr Lia Foyer  .  Left heart catheterization with coronary angiogram N/A 08/03/2011    Procedure: LEFT HEART CATHETERIZATION WITH CORONARY ANGIOGRAM;  Surgeon: Hillary Bow, MD;  Location: [facility] CATH LAB;  Service: Cardiovascular;  Laterality: N/A;  . Left heart catheterization with coronary/graft angiogram N/A 02/05/2014    Procedure: LEFT HEART CATHETERIZATION WITH Beatrix Fetters;  Surgeon: Troy Sine, MD;  Location: [facility] CATH LAB;  Service: Cardiovascular;  Laterality: N/A;  . Eye surgery Bilateral December 2016    bilateral cataract surgery    Allergies  Allergen Reactions  . Atorvastatin     REACTION: myalgias  . Folic Acid     REACTION: Face burns  . Nicardipine Hcl     REACTION: Gingival hypertrophy  . Pravastatin     myalgias    I have reviewed the patient's current medications . amLODipine  10 mg Oral Daily  . [START ON 06/13/2015] aspirin EC   81 mg Oral q morning - 10a  . benazepril  40 mg Oral Daily  . carvedilol  3.125 mg Oral BID WC  . cefUROXime (ZINACEF)  IV  1.5 g Intravenous Q12H  . Chlorhexidine Gluconate Cloth  6 each Topical Daily  . cholecalciferol  1,000 Units Oral Daily  . clopidogrel  75 mg Oral Daily  . [START ON 06/13/2015] docusate sodium  100 mg Oral Daily  . [START ON 06/13/2015] dorzolamide  1 drop Both Eyes Daily  . [START ON 06/13/2015] enoxaparin (LOVENOX) injection  40 mg Subcutaneous Q24H  . fentaNYL      . [START ON 06/13/2015] glipiZIDE  5 mg Oral Q breakfast  . insulin aspart  0-9 Units Subcutaneous TID WC  . insulin detemir  45 Units Subcutaneous QHS  . [START ON 06/13/2015] isosorbide mononitrate  30 mg Oral Daily  . latanoprost  1 drop Both Eyes QHS  . [START ON 06/13/2015] levothyroxine  50 mcg Oral QAC breakfast  . morphine      . mupirocin ointment  1 application Nasal BID  . pantoprazole  40 mg Oral Daily  . [START ON 06/15/2015] rosuvastatin  10 mg Oral Once per day on Sun Wed   . sodium chloride 125 mL/hr at 06/12/15 1201   sodium chloride, acetaminophen, alum & mag hydroxide-simeth, bisacodyl, furosemide, guaiFENesin-dextromethorphan, hydrALAZINE, labetalol, magnesium sulfate 1 - 4 g bolus IVPB, metoprolol, morphine injection, nitroGLYCERIN, ondansetron, oxyCODONE-acetaminophen, phenol, potassium chloride, senna-docusate  Prior to Admission medications   Medication Sig Start Date End Date Taking? Authorizing Provider  acetaminophen (TYLENOL) 325 MG tablet Take 650 mg by mouth every 6 (six) hours as needed for mild pain or moderate pain.    Yes Historical Provider, MD  amLODipine (NORVASC) 10 MG tablet TAKE 1 TABLET BY MOUTH EVERY DAY 04/04/15  Yes Carlena Bjornstad, MD  aspirin EC 81 MG tablet Take 81 mg by mouth every morning.   Yes Historical Provider, MD  benazepril (LOTENSIN) 40 MG tablet TAKE 1 TABLET BY MOUTH EVERY DAY 05/21/15  Yes Jerline Pain, MD  carvedilol (COREG) 3.125 MG tablet TAKE  1 TABLET (3.125 MG TOTAL) BY MOUTH 2 (TWO) TIMES DAILY WITH A MEAL. 02/18/15  Yes Jerline Pain, MD  cholecalciferol (VITAMIN D) 1000 UNITS tablet Take 1,000 Units by mouth daily.    Yes Historical Provider, MD  clopidogrel (PLAVIX) 75 MG tablet TAKE 1 TABLET BY MOUTH EVERY DAY 11/26/14  Yes Carlena Bjornstad, MD  dorzolamide (TRUSOPT) 2 % ophthalmic solution Place 1 drop into both eyes daily.  03/14/11  Yes  Historical Provider, MD  furosemide (LASIX) 40 MG tablet Take 20 mg by mouth daily as needed for fluid.   Yes Historical Provider, MD  glipiZIDE (GLUCOTROL XL) 5 MG 24 hr tablet TAKE 1 TABLET (5 MG TOTAL) BY MOUTH DAILY WITH BREAKFAST. 04/16/15  Yes Philemon Kingdom, MD  Insulin Detemir (LEVEMIR FLEXTOUCH) 100 UNIT/ML Pen Inject 45 Units into the skin daily at 10 pm. 06/02/15  Yes Philemon Kingdom, MD  Insulin Pen Needle (PEN NEEDLES) 32G X 4 MM MISC 1 each by Does not apply route daily. 05/29/15  Yes Philemon Kingdom, MD  isosorbide mononitrate (IMDUR) 30 MG 24 hr tablet TAKE 1 TABLET BY MOUTH EVERY DAY 01/28/15  Yes Carlena Bjornstad, MD  levothyroxine (SYNTHROID, LEVOTHROID) 50 MCG tablet Take 1 tablet (50 mcg total) by mouth daily before breakfast. 03/10/15  Yes Carlena Bjornstad, MD  LUMIGAN 0.01 % SOLN Place 1 drop into both eyes at bedtime.  01/27/15  Yes Historical Provider, MD  LUMIGAN 0.03 % ophthalmic solution Place into both eyes at bedtime and may repeat dose one time if needed.  02/26/11  Yes Historical Provider, MD  NONFORMULARY OR COMPOUNDED ITEM Diabetic testing supply Check glucose once daily if possible Fasting or morning glucose recommended M, W, F, & Sun if possible Goal= 100-150 Glucose 2 hours after breakfast Tue, after lunch Th & 2 hrs after eve meal Sat if possible Goal = < 180 07/13/12  Yes Hendricks Limes, MD  rosuvastatin (CRESTOR) 10 MG tablet Take 10 mg by mouth 2 (two) times a week. Takes on Sundays and Wednesdays   Yes Historical Provider, MD  rosuvastatin (CRESTOR) 10 MG tablet  TAKE 1 TABLET EVERY SUNDAY AND WEDNESDAY 05/16/15  Yes Hendricks Limes, MD  nitroGLYCERIN (NITROSTAT) 0.4 MG SL tablet Place 0.4 mg under the tongue every 5 (five) minutes as needed for chest pain.    Historical Provider, MD     Social History   Social History  . Marital Status: Married    Spouse Name: N/A  . Number of Children: N/A  . Years of Education: N/A   Occupational History  . PASTOR    Social History Main Topics  . Smoking status: Former Smoker -- 1.00 packs/day for 5 years    Quit date: 05/17/1949  . Smokeless tobacco: Never Used     Comment: Quit at age 47  . Alcohol Use: No  . Drug Use: No  . Sexual Activity: Not on file   Other Topics Concern  . Not on file   Social History Narrative   Pt lives with wife.    Family Status  Relation Status Death Age  . Father Deceased   . Mother Deceased    Family History  Problem Relation Age of Onset  . Diabetes Sister   . Heart attack Father 3  . Lung cancer Sister   . Stroke Neg Hx   . Diabetes Brother   . Hyperlipidemia Mother   . Hypertension Son     ROS:  Full 14 point review of systems complete and found to be negative unless listed above.  Physical Exam: Blood pressure 131/66, pulse 94, temperature 97.7 F (36.5 C), temperature source Oral, resp. rate 13, height '5\' 9"'$  (1.753 m), weight 188 lb 11.4 oz (85.6 kg), SpO2 97 %.  General: Well developed, well nourished, male in no acute distress Head: Eyes PERRLA, No xanthomas. Normocephalic and atraumatic, oropharynx without edema or exudate.  Lungs: Resp regular and unlabored, CTA. Heart:  RRR no s3, s4, or murmurs..   Neck: No carotid bruits. No lymphadenopathy.  JVD difficult to assess because of surgery on the right and body habitus. Abdomen: Bowel sounds present, abdomen soft and non-tender without masses or hernias noted. Msk:  No spine or cva tenderness. No weakness, no joint deformities or effusions. Extremities: No clubbing, cyanosis. He has some upper  extremity edema after the surgery, but this is unusual for him.Marland Kitchen DP/PT/Radials 2+ and equal bilaterally. Neuro: Alert and oriented X 3. No focal deficits noted. Psych:  Good affect, responds appropriately Skin: No rashes or lesions noted.  Labs:   Lab Results  Component Value Date   WBC 12.0* 06/12/2015   HGB 13.4 06/12/2015   HCT 39.9 06/12/2015   MCV 91.5 06/12/2015   PLT 171 06/12/2015    Recent Labs Lab 06/12/15 1640  NA 137  K 4.3  CL 107  CO2 21*  BUN 22*  CREATININE 1.28*  CALCIUM 8.7*  GLUCOSE 207*   MAGNESIUM  Date Value Ref Range Status  06/12/2015 2.0 1.7 - 2.4 mg/dL Final    Recent Labs  06/12/15 1640  TROPONINI 0.03   Echo: 02/06/2014 Conclusions - Left ventricle: Apical hypokinesis Poor endocardial definition even with contrast. Wall thickness was increased in a pattern of moderate LVH. Systolic function was normal. The estimated ejection fraction was in the range of 50% to 55%. - Aortic valve: There was mild regurgitation. - Mitral valve: There was mild regurgitation. - Left atrium: The atrium was mildly dilated. - Right atrium: The atrium was mildly dilated. - Atrial septum: No defect or patent foramen ovale was identified.  ECG:  06/12/2015 Sinus rhythm with a right bundle branch block which is old.  CATH: 02/05/2014  Expand All Collapse All   IMPRESSION: Ischemic cardiomyopathy with an ejection fraction of 30-35%, and evidence for severe hypokinesis to akinesis of the inferior wall, hypokinesis of the distal anterolateral wall and apex with a sharp cutoff at the apex, raising the possibility of a layered thrombus. Severe multivessel native coronary obstructive disease with total occlusion of the first diagonal branch of the LAD or ostially, 78% stenosis in the very proximal diagonal vessel, with 80% LAD stenosis before the septal perforator artery and total occlusion beyond the septal perforator; total occlusion of the intermediate  vessel, total occlusion of the proximal circumflex and RCA. Patent LIMA graft supplying the mid LAD with evidence for high percent apical LAD stenosis and evidence for collateralization to a diagonal vessel. Occluded saphenous vein graft immediately proximal to a previously placed proximal stent which had supplied the circumflex marginal vessel. Occlusion of the saphenous vein graft that supplied the diagonal vessel. RECOMMENDATION: Medical therapy. Consider Definity contrast echo to evaluate for possible apical thrombus.      Radiology:  No results found.  ASSESSMENT AND PLAN:    Principal Problem:   Carotid stenosis - Doing well after surgery, plan per Dr. Trula Slade  Active Problems:   Precordial chest pain - His symptoms are atypical for his previous angina, and were relieved by Mylanta - The patient feels that he may have had indigestion. - We will cycle cardiac enzymes, if these are negative, no further evaluation is needed. - Keep follow-up appointment with Dr. Marlou Porch  Signed: Rosaria Ferries, PA-C 06/12/2015, 6:31 PM   Co-Sign MD

## 2015-06-12 NOTE — Op Note (Signed)
Patient name: [name] MRN: [id number] DOB: Sep 24, 1925 Sex: male  06/12/2015 Pre-operative Diagnosis: Asymptomatic   right carotid stenosis Post-operative diagnosis:  Same Surgeon:  Annamarie Major Assistants:  Lennie Muckle Procedure:    right carotid Endarterectomy with bovine pericardial  patch angioplasty Anesthesia:  General Blood Loss:  See anesthesia record Specimens:  Carotid Plaque to pathology  Findings:  95 %stenosis; Thrombus:  none  Indications:  82 year old with >80% asymptomatic right carotid stenosis by u/s  Procedure:  The patient was identified in the holding area and taken to [place] [facility]  The patient was then placed supine on the table.   General endotrachial anesthesia was administered.  The patient was prepped and draped in the usual sterile fashion.  A time out was called and antibiotics were administered.  The incision was made along the anterior border of the right sternocleidomastoid muscle.  Cautery was used to dissect through the subcutaneous tissue.  The platysma muscle was divided with cautery.  The internal jugular vein was exposed along its anterior medial border.  The common facial vein was exposed and then divided between 2-0 silk ties and metal clips.  The common carotid artery was then circumferentially exposed and encircled with an umbilical tape.  The vagus nerve was identified and protected.  Next sharp dissection was used to expose the external carotid artery and the superior thyroid artery.  The were encircled with a blue vessel loop and a 2-0 silk tie respectively.  Finally, the internal carotid was carefully dissected free.  An umbilical tape was placed around the internal carotid artery distal to the diseased segment.  The hypoglossal nerve was visualized throughout and protected.  The patient was given systemic heparinization.  A bovine carotid patch was selected and prepared on the back table.  A 10 french shunt was also prepared.  After blood  pressure readings were appropriate and the heparin had been given time to circulate, the internal carotid artery was occluded with a baby Gregory clamp.  The external and common carotid arteries were then occluded with vascular clamps and the 2-0 tie tightened on the superior thyroid artery.  A #11 blade was used to make an arteriotomy in the common carotid artery.  This was extended with Potts scissors along the anterior and lateral border of the common and internal carotid artery.  Approximately 95% stenosis was identified.  There was no thrombus identified.  The 10 french shunt was not placed as there was pulsatile backbleeding.  A kleiner kuntz elevator was used to perform endarterectomy.  An eversion endarterectomy was performed in the external carotid artery.  A good distal endpoint was obtained in the internal carotid artery.  The specimen was removed and sent to pathology.  Heparinized saline was used to irrigate the endarterectomized field.  All potential embolic debris was removed.  7-0 Prolene surtures were used to tack the distal endpoint.  Bovine pericardial patch angioplasty was then performed using a running 6-0 Prolene.The common internal and external carotid arteries were all appropriately flushed. The artery was again irrigated with heparin saline.  The anastomosis was then secured. The clamp was first released on the external carotid artery followed by the common carotid artery approximately 30 seconds later, bloodflow was reestablish through the internal carotid artery.  Next, a hand-held  Doppler was used to evaluate the signals in the common, external, and internal  carotid arteries, all of which had appropriate signals. I then administered  50 mg protamine.  The wound was then irrigated.  After hemostasis was achieved, the carotid sheath was reapproximated with 3-0 Vicryl. The  platysma muscle was reapproximated with running 3-0 Vicryl. The skin  was closed with 4-0 Vicryl. Dermabond was  placed on the skin. The  patient was then successfully extubated. His neurologic exam was  similar to his preprocedural exam. The patient was then taken to recovery room  in stable condition. There were no complications.     Disposition:  To PACU in stable condition.  Relevant Operative Details:  Near occlusive plaque with significant plaque burden, resembling a ruptured plaque with mobile debris within artery.  Plaque extended high above the carotid bifurcation.  No shunt was required.  Hypoglossal was mobilized.  Theotis Burrow, M.D. [facility] Office: [phone] Pager:  [phone]

## 2015-06-12 NOTE — Anesthesia Postprocedure Evaluation (Signed)
Anesthesia Post Note  Patient: [name]  Procedure(s) Performed: Procedure(s) (LRB): ENDARTERECTOMY CAROTID WITH PATCH ANGIOPLASTY (Right)  Patient location during evaluation: PACU Anesthesia Type: General Level of consciousness: awake and alert Pain management: pain level controlled Vital Signs Assessment: post-procedure vital signs reviewed and stable Respiratory status: spontaneous breathing, nonlabored ventilation, respiratory function stable and patient connected to nasal cannula oxygen Cardiovascular status: blood pressure returned to baseline and stable Postop Assessment: no signs of nausea or vomiting Anesthetic complications: no    Last Vitals:  Filed Vitals:   06/12/15 1045 06/12/15 1100  BP: 151/73 154/77  Pulse: 85 86  Temp:    Resp: 14 8    Last Pain:  Filed Vitals:   06/12/15 1115  PainSc: Asleep    LLE Motor Response: Responds to commands;Purposeful movement (06/12/15 1115) LLE Sensation: Full sensation (06/12/15 1115) RLE Motor Response: Responds to commands;Purposeful movement (06/12/15 1115) RLE Sensation: Full sensation (06/12/15 1115)      [name]

## 2015-06-12 NOTE — Anesthesia Procedure Notes (Signed)
Procedure Name: Intubation Date/Time: 06/12/2015 7:42 AM Performed by: Lance Coon Pre-anesthesia Checklist: Patient identified, Emergency Drugs available, Timeout performed, Suction available and Patient being monitored Patient Re-evaluated:Patient Re-evaluated prior to inductionOxygen Delivery Method: Circle system utilized Preoxygenation: Pre-oxygenation with 100% oxygen Intubation Type: IV induction Laryngoscope Size: 2 Grade View: Grade I Tube type: Oral Tube size: 7.5 mm Number of attempts: 1 Airway Equipment and Method: Stylet Placement Confirmation: ETT inserted through vocal cords under direct vision,  breath sounds checked- equal and bilateral and positive ETCO2 Secured at: 23 cm Tube secured with: Tape Dental Injury: Teeth and Oropharynx as per pre-operative assessment

## 2015-06-13 ENCOUNTER — Encounter (HOSPITAL_COMMUNITY)
Admission: RE | Disposition: A | Discharge status: Home / Self Care | Payer: Self-pay | Source: Ambulatory Visit | Attending: Surgery

## 2015-06-13 ENCOUNTER — Encounter (HOSPITAL_COMMUNITY): Payer: Self-pay | Admitting: Surgery

## 2015-06-13 DIAGNOSIS — I214 Non-ST elevation (NSTEMI) myocardial infarction: Secondary | ICD-10-CM

## 2015-06-13 DIAGNOSIS — E1159 Type 2 diabetes mellitus with other circulatory complications: Secondary | ICD-10-CM

## 2015-06-13 DIAGNOSIS — I25110 Atherosclerotic heart disease of native coronary artery with unstable angina pectoris: Secondary | ICD-10-CM

## 2015-06-13 DIAGNOSIS — E1165 Type 2 diabetes mellitus with hyperglycemia: Secondary | ICD-10-CM

## 2015-06-13 LAB — BASIC METABOLIC PANEL
Anion gap: 7 (ref 5–15)
BUN: 19 mg/dL (ref 6–20)
CHLORIDE: 108 mmol/L (ref 101–111)
CO2: 23 mmol/L (ref 22–32)
Calcium: 8.3 mg/dL — ABNORMAL LOW (ref 8.9–10.3)
Creatinine, Ser: 1.33 mg/dL — ABNORMAL HIGH (ref 0.61–1.24)
GFR calc Af Amer: 54 mL/min — ABNORMAL LOW (ref >60)
GFR calc non Af Amer: 46 mL/min — ABNORMAL LOW (ref >60)
GLUCOSE: 223 mg/dL — AB (ref 65–99)
POTASSIUM: 4.2 mmol/L (ref 3.5–5.1)
Sodium: 138 mmol/L (ref 135–145)

## 2015-06-13 LAB — GLUCOSE, CAPILLARY
GLUCOSE-CAPILLARY: 207 mg/dL — AB (ref 65–99)
GLUCOSE-CAPILLARY: 230 mg/dL — AB (ref 65–99)
GLUCOSE-CAPILLARY: 194 mg/dL — AB (ref 65–99)
GLUCOSE-CAPILLARY: 142 mg/dL — AB (ref 65–99)

## 2015-06-13 LAB — CBC
HCT: 36 % — ABNORMAL LOW (ref 39.0–52.0)
HEMOGLOBIN: 12.1 g/dL — AB (ref 13.0–17.0)
MCH: 31.6 pg (ref 26.0–34.0)
MCHC: 33.6 g/dL (ref 30.0–36.0)
MCV: 94 fL (ref 78.0–100.0)
Platelets: 150 10*3/uL (ref 150–400)
RBC: 3.83 MIL/uL — AB (ref 4.22–5.81)
RDW: 13.3 % (ref 11.5–15.5)
WBC: 7.5 10*3/uL (ref 4.0–10.5)

## 2015-06-13 LAB — HEMOGLOBIN A1C
Hgb A1c MFr Bld: 8.2 % — ABNORMAL HIGH (ref 4.8–5.6)
Mean Plasma Glucose: 189 mg/dL

## 2015-06-13 LAB — TROPONIN I: Troponin I: 0.54 ng/mL (ref <0.031)

## 2015-06-13 LAB — PROTIME-INR
INR: 1.15 (ref 0.00–1.49)
Prothrombin Time: 14.9 seconds (ref 11.6–15.2)

## 2015-06-13 SURGERY — INVASIVE LAB ABORTED CASE

## 2015-06-13 MED ORDER — OXYCODONE-ACETAMINOPHEN 5-325 MG PO TABS: 1.0000 | ORAL_TABLET | Freq: Four times a day (QID) | ORAL | Status: DC | PRN

## 2015-06-13 MED ORDER — ASPIRIN 81 MG PO CHEW: 81.0000 mg | CHEWABLE_TABLET | ORAL | Status: AC

## 2015-06-13 MED ORDER — SODIUM CHLORIDE 0.9 % IV SOLN: INTRAVENOUS | Status: DC

## 2015-06-13 SURGICAL SUPPLY — 5 items
GLIDESHEATH SLEND A-KIT 6F 22G (SHEATH) ×4 IMPLANT
KIT HEART LEFT (KITS) ×4 IMPLANT
PACK CARDIAC CATHETERIZATION (CUSTOM PROCEDURE TRAY) ×4 IMPLANT
TRANSDUCER W/STOPCOCK (MISCELLANEOUS) ×4 IMPLANT
TUBING CIL FLEX 10 FLL-RA (TUBING) ×4 IMPLANT

## 2015-06-13 NOTE — Progress Notes (Addendum)
[facility]  Subjective  - Episode of Chest pain yesterday around 2 PM.  After he ate, he developed pinpoint midsternal chest pain, dull, 4/10. It was not associated with shortness of breath, diaphoresis, or nausea and vomiting. It was not worse with deep inspiration or position. At first, there was concern for left arm radiation, but that turned out to be localized pain from his IV site attempts. They tried Mylanta for his pain. His symptoms then resolved. The pain has not been returned. EKG no change from pre-op, troponin draws first 0.20 and second 0.54. Cardiology was called.     Objective 115/65 70 98.3 F (36.8 C) (Oral) 16 94%  Intake/Output Summary (Last 24 hours) at 06/13/15 9937 Last data filed at 06/12/15 2338  Gross per 24 hour  Intake 1673.75 ml  Output    650 ml  Net 1023.75 ml    Right carotid incision clean and dry without hematoma Palpable radial pulses equal  No tongue deviation and smile is symmetric Speech is clean Heart NS with PVC s  Assessment/Planning: POD # 1 right CEA  Chest pain with history of CABG and stenting Cardiology was consulted.  As of yesterday his troponin's showed 0.20 and 0.54. He tolerated clears over night Spoke with Cardiology PA we will continue with clears until they have seen him today and decide if further work up is warranted. He has ambulated and voiding.  Laurence Slate [facility] 06/13/2015 7:13 AM --  Laboratory Lab Results:  Recent Labs  06/12/15 1323 06/13/15 0604  WBC 12.0* 7.5  HGB 13.4 12.1*  HCT 39.9 36.0*  PLT 171 150   BMET  Recent Labs  06/12/15 1640 06/13/15 0604  NA 137 138  K 4.3 4.2  CL 107 108  CO2 21* 23  GLUCOSE 207* 223*  BUN 22* 19  CREATININE 1.28* 1.33*  CALCIUM 8.7* 8.3*    COAG Lab Results  Component Value Date   INR 1.08 06/05/2015   INR 1.06 08/15/2014   INR 0.94 02/04/2014   No results found for: PTT    POD#1 No pain but feels weak all  over CV:  troponins elevated.  CP resolved.  Plan cath today.  Restart Plavix, con't ASA Neuro:  Intact, s/p R CEA Continue statin DM:  SSI Likely d/c tomorrow, pending results of heart cath  [facility]

## 2015-06-13 NOTE — Consult Note (Addendum)
   Elderly somewhat frail 82 year old gentleman with history of coronary artery disease, prior coronary bypass grafting, known total occlusion of saphenous vein grafts, native right coronary, circumflex, and LAD. Patent LIMA to LAD and diagonal / septal perforator off he left main that were not amenable to PCI in 2015.  Right carotid endarterectomy 1/26/22017.  Had chest discomfort last evening and serial troponin evaluation demonstrated a slight rise, peaking at 0.54.   Coronary angiography was recommended by Dr. Ellyn Hack after conferring with Dr. Trula Slade.  Patient is currently asymptomatic but very confused. He declares that we have the wrong person in the cath lab and does not want to have the procedure. Additionally, after reviewing the digital images I do not believe we will find anatomy amenable to PCI unless there is LAD disease distal to the LIMA graft insertion site.  Under the current circumstances, I have canceled the coronary angiogram and believe that medical management is the most prudent course of action unless recurrent or unstable symptoms develop.  Confusion needs to be further evaluated as well.  Time spent analyzing the history, hospital course, co-morbidities, examining the patient, and speaking to the family was 35 minutes.

## 2015-06-13 NOTE — Progress Notes (Addendum)
Patient: [name] / Admit Date: 06/12/2015 / Date of Encounter: 06/13/2015, 8:07 AM   Subjective: No further CP since prior episode. No SOB. Just reports feeling vaguely "weak" this AM. VSS.   Objective: Telemetry: NSR Physical Exam: Blood pressure 115/65, pulse 70, temperature 98.3 F (36.8 C), temperature source Oral, resp. rate 16, height '5\' 9"'$  (1.753 m), weight 188 lb 11.4 oz (85.6 kg), SpO2 94 %. General: Well developed, well nourished elderly WM, in no acute distress. Head: Normocephalic, atraumatic, sclera non-icteric, no xanthomas, nares are without discharge. Neck: s/p R CEA - incision without dehiscence or suppuration. JVP not elevated. Lungs: Clear bilaterally to auscultation without wheezes, rales, or rhonchi. Breathing is unlabored. Heart: RRR S1 S2 without murmurs, rubs, or gallops.  Abdomen: Soft, non-tender, non-distended with normoactive bowel sounds. No rebound/guarding. Extremities: No clubbing or cyanosis. No edema. Distal pedal pulses are 2+ and equal bilaterally. Neuro: Alert and oriented X 3. Moves all extremities spontaneously. Psych:  Responds to questions appropriately with a normal affect.   Intake/Output Summary (Last 24 hours) at 06/13/15 0807 Last data filed at 06/12/15 2338  Gross per 24 hour  Intake 1673.75 ml  Output    650 ml  Net 1023.75 ml    Inpatient Medications:  . amLODipine  10 mg Oral Daily  . aspirin EC  81 mg Oral q morning - 10a  . benazepril  40 mg Oral Daily  . carvedilol  3.125 mg Oral BID WC  . Chlorhexidine Gluconate Cloth  6 each Topical Daily  . cholecalciferol  1,000 Units Oral Daily  . clopidogrel  75 mg Oral Daily  . docusate sodium  100 mg Oral Daily  . dorzolamide  1 drop Both Eyes Daily  . enoxaparin (LOVENOX) injection  40 mg Subcutaneous Q24H  . glipiZIDE  5 mg Oral Q breakfast  . insulin aspart  0-9 Units Subcutaneous TID WC  . insulin detemir  45 Units Subcutaneous QHS  . isosorbide mononitrate  30 mg  Oral Daily  . latanoprost  1 drop Both Eyes QHS  . levothyroxine  50 mcg Oral QAC breakfast  . mupirocin ointment  1 application Nasal BID  . pantoprazole  40 mg Oral Daily  . [START ON 06/15/2015] rosuvastatin  10 mg Oral Once per day on Sun Wed   Infusions:  . sodium chloride 125 mL/hr at 06/13/15 0656    Labs:  Recent Labs  06/12/15 1640 06/13/15 0604  NA 137 138  K 4.3 4.2  CL 107 108  CO2 21* 23  GLUCOSE 207* 223*  BUN 22* 19  CREATININE 1.28* 1.33*  CALCIUM 8.7* 8.3*  MG 2.0  --     Recent Labs  06/12/15 1323 06/13/15 0604  WBC 12.0* 7.5  HGB 13.4 12.1*  HCT 39.9 36.0*  MCV 91.5 94.0  PLT 171 150    Recent Labs  06/12/15 1640 06/12/15 2232 06/13/15 0604  TROPONINI 0.03 0.20* 0.54*   Invalid input(s): POCBNP  Recent Labs  06/12/15 1323  HGBA1C 8.2*     Radiology/Studies:  No results found.   Assessment and Plan  61M with CAD s/p CABG 1994, DM, hypothyroidism, carotid artery disease, HLD, mild AI/MR by echo 2015 (EF 50-55% at that time, previously as low as 30-35% by cath 01/2014), CKD III, RBBB admitted for right carotid endarterectomy.  Last cath 01/2014 in setting of NSTEMI with ultimate recommendation to continue medical therapy. On 06/12/15 had episode of pinpoint midsternal chest pain, throat pain improved with  Mylanta, dissimilar to prior cardiac sx.   1. Chest pain with elevated troponin - symptoms felt atypical (more c/w GI), however, troponin trend 0.03-0.20-0.54. He is on ASAS, Plavix, BB, Imdur. Not presently on heparin due to recent surgery. Will review further plans with MD.  2. HTN - better controlled this AM.  3. CKD stage III - Cr appears at baseline.  4. Carotid artery disease s/p R CEA 06/12/15 - per [facility]. Now on ASA/Plavix.  Signed, Randy Copa PA-C Pager: [phone]   I have seen, examined and evaluated the patient this AM along with Ms. Randy Primer, PA-C.  After reviewing all the available data and chart,  I agree with her findings,  examination as well as impression recommendations.  No further CP overnight, just feels very weak.  Wants to get up.  Is hungry.  ASA & Plavix added on.  Principal Problem:   Carotid stenosis Active Problems:   Atherosclerotic heart disease of native coronary artery with unstable angina pectoris (HCC)   Non-STEMI (non-ST elevated myocardial infarction) ([place])   Pulmonary hypertension ([place])   Poorly controlled type 2 diabetes mellitus with circulatory disorder (HCC)   Precordial chest pain   Unexpectedly, his Troponin Level did bump suggesting that his episode of epigastric discomfort yesterday may well be ACS/NSTEMI & not GI related.   He would like to go home ASAP, but as weak as he feels today, is not likely going to ready for d/c later today anyway.  Dr. Trula Maxwell was actually here with me initially.  We both discussed options & together feel that the most prudent COA would be to exclude any severe CAD concerns prior to d/c home - knowing his history.  I then had a 45 min discussion with the patient (& eventually his wife) re: Troponin results & thoughts on management.  With ? NSTEMI, d/c home tomorrow would be premature if the plan is simply medical management with IV Heparin & titration of cardiac medications.   He is less than eager about undergoing an invasive procedure, but after a long discussion & input from his wife, he has agreed with the plan for LHC-Cors& Grafts +/- PCI via L Radial Approach later today.  - If PCI or Dx cath, he would still be eligible for d/c home in AM if he is strong enough to do so.  Renal Fxn is stable - will gently hydrate pre-cath.  He is already on CCB, ACE-I, Imdur & statin along with low dose BB.   Total time with patient > 60 min.   Procedure:  LEFT HEART CATHETERIZATION WITH NATIVE CORONARY AND GRAFT ANGIOGRAPHY WITH POSSIBLE PERCUTANEOUS CORONARY INTERVENTION  The procedure with Risks/Benefits/Alternatives and Indications was reviewed with  the patient & wife.  All questions were answered.    Risks / Complications include, but not limited to: Death, MI, CVA/TIA, VF/VT (with defibrillation), Bradycardia (need for temporary pacer placement), contrast induced nephropathy, bleeding / bruising / hematoma / pseudoaneurysm, vascular or coronary injury (with possible emergent CT or Vascular Surgery), adverse medication reactions, infection.  Additional risks involving the use of radiation with the possibility of radiation burns and cancer were explained in detail.  Increased risk with graft angiography discussed.  The patient & wife voice understanding and agree to proceed.    Will provide light breakfast since the procedure will likely be in the afternoon.    Leonie Man, M.D., M.S. Interventional Cardiologist   Pager # [phone] Phone # [phone] [address] [place], [place]  [address]    

## 2015-06-13 NOTE — Progress Notes (Signed)
FYI benazepril discontinued today in prep for cath given CKD. Dayna Dunn PA-C

## 2015-06-13 NOTE — Progress Notes (Addendum)
Inpatient Diabetes Program Recommendations  AACE/ADA: New Consensus Statement on Inpatient Glycemic Control (2015)  Target Ranges:  Prepandial:   less than 140 mg/dL      Peak postprandial:   less than 180 mg/dL (1-2 hours)      Critically ill patients:  140 - 180 mg/dL    Results for [name], [name] (MRN [id number]) as of 06/13/2015 13:21  Ref. Range 06/13/2015 08:14 06/13/2015 11:02  Glucose-Capillary Latest Ref Range: 65-99 mg/dL 207 (H) 230 (H)    Admit ICA Stenosis.   History: DM, CABG  Home Insulin Regimen: Levemir 45 units QHS       Glipizide 5 mg daily  Current Insulin Orders: Levemir 45 units QHS       Novolog Sensitive SSI (0-9 units) TID AC      Glipizide 5 mg daily       MD- Please consider increasing Novolog SSI to Moderate scale (0-15 units) TID AC + HS  If fasting glucose levels continue to stay elevated, may want to increase Levemir dose to 48 units QHS     --Will follow patient during hospitalization--  Wyn Quaker RN, MSN, CDE Diabetes Coordinator Inpatient Glycemic Control Team Team Pager: [phone] (8a-5p)

## 2015-06-14 DIAGNOSIS — I25810 Atherosclerosis of coronary artery bypass graft(s) without angina pectoris: Secondary | ICD-10-CM

## 2015-06-14 LAB — BASIC METABOLIC PANEL
Anion gap: 5 (ref 5–15)
BUN: 15 mg/dL (ref 6–20)
CHLORIDE: 110 mmol/L (ref 101–111)
CO2: 25 mmol/L (ref 22–32)
CREATININE: 1.45 mg/dL — AB (ref 0.61–1.24)
Calcium: 8.5 mg/dL — ABNORMAL LOW (ref 8.9–10.3)
GFR calc non Af Amer: 42 mL/min — ABNORMAL LOW (ref >60)
GFR, EST AFRICAN AMERICAN: 48 mL/min — AB (ref >60)
Glucose, Bld: 135 mg/dL — ABNORMAL HIGH (ref 65–99)
Potassium: 4 mmol/L (ref 3.5–5.1)
SODIUM: 140 mmol/L (ref 135–145)

## 2015-06-14 LAB — GLUCOSE, CAPILLARY
GLUCOSE-CAPILLARY: 121 mg/dL — AB (ref 65–99)
Glucose-Capillary: 158 mg/dL — ABNORMAL HIGH (ref 65–99)

## 2015-06-14 LAB — TROPONIN I: Troponin I: 0.18 ng/mL — ABNORMAL HIGH (ref <0.031)

## 2015-06-14 MED ORDER — INSULIN ASPART 100 UNIT/ML ~~LOC~~ SOLN: 0.0000 [IU] | Freq: Three times a day (TID) | SUBCUTANEOUS | Status: DC

## 2015-06-14 MED ORDER — INSULIN ASPART 100 UNIT/ML ~~LOC~~ SOLN
0.0000 [IU] | Freq: Three times a day (TID) | SUBCUTANEOUS | Status: DC
  Administered 2015-06-14: 2 [IU] via SUBCUTANEOUS
  Administered 2015-06-14: 3 [IU] via SUBCUTANEOUS

## 2015-06-14 MED ORDER — MORPHINE SULFATE (PF) 2 MG/ML IV SOLN: 2.0000 mg | INTRAVENOUS | Status: DC | PRN

## 2015-06-14 MED ORDER — OXYCODONE-ACETAMINOPHEN 5-325 MG PO TABS: 1.0000 | ORAL_TABLET | Freq: Four times a day (QID) | ORAL | Status: DC | PRN

## 2015-06-14 NOTE — Progress Notes (Signed)
Patient discharge instructions reviewed with patient, his wife Benen Weida and daughter who are bedside. Follow up appointments reviewed, prescription for pain medication given to patients wife. Patient and wife verbalize understanding of information. Education regarding surgical incision given and patient and wife verbalize understanding of what to look for and when to call the medical provider.   PIV removed. VSS. Pt in no acute distress. Patient discharged via wheelchair.

## 2015-06-14 NOTE — Progress Notes (Signed)
Spoke with Ellen Henri, PA with Cardiology.  She reviewed EKG and lab results and deemed patient ok to discharge.

## 2015-06-14 NOTE — Discharge Summary (Signed)
Discharge Summary     [name] 03-Dec-1925 82 y.o. male  [id number]  Admission Date: 06/12/2015  Discharge Date: 06/14/15  Physician: Serafina Mitchell, MD  Admission Diagnosis: Right carotid artery stenosis I65.21 cp   HPI:   This is a 82 y.o. male who comes in today for evaluation of carotid disease. He has had a carotid bruit detected which ultimately led to serial ultrasounds. His most recent ultrasound showed greater than 80% right carotid stenosis. The patient remains asymptomatic. Specifically he denies numbness or weakness in either extremity. He denies slurred speech. He denies amaurosis fugax.   Patient suffers from coronary artery disease. He is status post CABG and stenting. He is on dual antiplatelet therapy. He is medically managed for hypertension. He is on ACE inhibitor. He suffers from hypercholesterolemia for which he takes a statin. Patient is also a diabetic. His most recent hemoglobin A1c is 7.3.  Hospital Course:  The patient was admitted to the hospital and taken to the operating room on 06/12/2015 - 06/13/2015 and underwent righ carotid endarterectomy.  The pt tolerated the procedure well and was transported to the PACU in good condition.  That afternoon, the pt did c/o chest pain and a cardiology consult was obtained.     By POD 1, the pt neuro status was in tact.  He did not have any further chest pain, but his troponins were elevated.  It was recommended that he undergo cardiac catheterization.  In the cath lab, the pt was confused and declared that they had the wrong pt and he did not want the procedure.  Dr. Tamala Julian reviewed the pt's digital images and did not believe there would be any anatomy amendable to PCI unless there was LAD dz distal to the LIMA.  Under the circumstances, the case was cancelled and medical management was suggested unless he had recurrent or unstable sx.    By POD 2, he was doing well without recurrent chest pain.  His  confusion was improved and he was alert to place, year, month and Software engineer.    He does have a hx of CKD 3.  He had a creatinine of 1.45 on POD 2, which was elevated from the past 2 days, however, it has been 1.4 prior to that.  His Glipizide is discontinued and he will f/u with his PCP to have his renal function and glucose checked next week to make sure this is stable and evaluate DM medications.  Pt was seen by Dr. Meda Coffee and pt will be discharged today and f/u with Dr. Marlou Porch on 06/23/15.  The remainder of the hospital course consisted of increasing mobilization and increasing intake of solids without difficulty.    Recent Labs  06/13/15 0604 06/14/15 0300  NA 138 140  K 4.2 4.0  CL 108 110  CO2 23 25  GLUCOSE 223* 135*  BUN 19 15  CALCIUM 8.3* 8.5*    Recent Labs  06/12/15 1323 06/13/15 0604  WBC 12.0* 7.5  HGB 13.4 12.1*  HCT 39.9 36.0*  PLT 171 150    Recent Labs  06/13/15 1405  INR 1.15     Discharge Instructions    Call MD for:  redness, tenderness, or signs of infection (pain, swelling, bleeding, redness, odor or green/yellow discharge around incision site)    Complete by:  As directed      Call MD for:  severe or increased pain, loss or decreased feeling  in affected limb(s)    Complete by:  As directed      Call MD for:  temperature >100.5    Complete by:  As directed      Discharge instructions    Complete by:  As directed   You may shower starting tomorrow 06/14/2015.     Driving Restrictions    Complete by:  As directed   No driving for 1 week     Increase activity slowly    Complete by:  As directed   Walk with assistance use walker or cane as needed     Lifting restrictions    Complete by:  As directed   No lifting for 6 weeks     Resume previous diet    Complete by:  As directed            Discharge Diagnosis:  Right carotid artery stenosis I65.21 cp  Secondary Diagnosis: Patient Active Problem List   Diagnosis Date Noted  .  Non-STEMI (non-ST elevated myocardial infarction) ([place]) 06/13/2015  . Carotid stenosis 06/12/2015  . Precordial chest pain 06/12/2015  . Pre-operative cardiovascular examination 02/05/2015  . Poorly controlled type 2 diabetes mellitus with circulatory disorder ([place]) 11/21/2014  . Bilateral carotid bruits 02/28/2014  . Periodontal disease 02/13/2014  . Atherosclerotic heart disease of native coronary artery with unstable angina pectoris ([place])   . Statin intolerance 07/20/2013  . Chronic systolic CHF (congestive heart failure) ([place]) 08/31/2012  . Elevated CPK   . Abnormal LFTs   . Ejection fraction   . Hypokalemia 08/10/2011  . Renal insufficiency 08/03/2011  . Hypertension   . Aortic insufficiency   . Mitral regurgitation   . Pulmonary hypertension ([place])   . Hx of CABG   . Dyslipidemia   . Hypothyroidism-TSH 9   . Bradycardia   . RBBB with LAFB   . Leg cramps   . Shingles   . Dizziness   . POSTHERPETIC NEURALGIA 04/29/2009  . VITAMIN D DEFICIENCY 02/17/2009  . ARTHRALGIA 02/12/2009  . UNSPECIFIED MYALGIA AND MYOSITIS 02/12/2009  . SHORTNESS OF BREATH 11/28/2008  . THROMBOCYTOPENIA 08/12/2008  . ADVERSE DRUG REACTION 07/16/2008  . DM (diabetes mellitus), secondary, uncontrolled, with peripheral vascular complications ([place]) 84/69/6295  . GLAUCOMA, BORDERLINE 10/16/2007   Past Medical History  Diagnosis Date  . CAD (coronary artery disease)   . Hypertension   . Ejection fraction     45%, echo, March, 2012,   /  Ejection fraction after his MI March, 2013 is 45%. There is posterior basal akinesis with abnormal septal motion.  . Aortic insufficiency     Moderate, echo, March, 2012  . Mitral regurgitation     Moderate, echo, March, 2012  . Pulmonary hypertension ([place])     Echo, March, 2012, 42 mmHg  . Hx of CABG   . Diabetes mellitus   . Dyslipidemia   . Hypothyroidism   . Bradycardia     Sinus bradycardia  . RBBB (right bundle branch block with left anterior fascicular  block)     New July, 2010  . Leg cramps     history of nighttime  . Shingles October 2011    Severe pain to right flank treated  . Ischemia     .Marland KitchenMarland KitchenEF 42%  . Hyperlipidemia   . Dizziness      12/2009 & 12/ 2012  . Abnormal LFTs     LFTs were abnormal with acute MI March, 2013, improving at the time of discharge  . Myocardial infarction ([place]) 3/19-27/2013    ARF, elevated  LFTs  . Systolic CHF ([place])   . Elevated CPK     2013, now in 2014 patient on very low dose Crestor  . Hard of hearing   . Nocturia   . Wears glasses   . Arthritis     hands      Medication List    STOP taking these medications        glipiZIDE 5 MG 24 hr tablet  Commonly known as:  GLUCOTROL XL      TAKE these medications        acetaminophen 325 MG tablet  Commonly known as:  TYLENOL  Take 650 mg by mouth every 6 (six) hours as needed for mild pain or moderate pain.     amLODipine 10 MG tablet  Commonly known as:  NORVASC  TAKE 1 TABLET BY MOUTH EVERY DAY     aspirin EC 81 MG tablet  Take 81 mg by mouth every morning.     benazepril 40 MG tablet  Commonly known as:  LOTENSIN  TAKE 1 TABLET BY MOUTH EVERY DAY     carvedilol 3.125 MG tablet  Commonly known as:  COREG  TAKE 1 TABLET (3.125 MG TOTAL) BY MOUTH 2 (TWO) TIMES DAILY WITH A MEAL.     cholecalciferol 1000 units tablet  Commonly known as:  VITAMIN D  Take 1,000 Units by mouth daily.     clopidogrel 75 MG tablet  Commonly known as:  PLAVIX  TAKE 1 TABLET BY MOUTH EVERY DAY     dorzolamide 2 % ophthalmic solution  Commonly known as:  TRUSOPT  Place 1 drop into both eyes daily.     furosemide 40 MG tablet  Commonly known as:  LASIX  Take 20 mg by mouth daily as needed for fluid.     Insulin Detemir 100 UNIT/ML Pen  Commonly known as:  LEVEMIR FLEXTOUCH  Inject 45 Units into the skin daily at 10 pm.     isosorbide mononitrate 30 MG 24 hr tablet  Commonly known as:  IMDUR  TAKE 1 TABLET BY MOUTH EVERY DAY      levothyroxine 50 MCG tablet  Commonly known as:  SYNTHROID, LEVOTHROID  Take 1 tablet (50 mcg total) by mouth daily before breakfast.     LUMIGAN 0.03 % ophthalmic solution  Generic drug:  bimatoprost  Place into both eyes at bedtime and may repeat dose one time if needed.     LUMIGAN 0.01 % Soln  Generic drug:  bimatoprost  Place 1 drop into both eyes at bedtime.     nitroGLYCERIN 0.4 MG SL tablet  Commonly known as:  NITROSTAT  Place 0.4 mg under the tongue every 5 (five) minutes as needed for chest pain.     NONFORMULARY OR COMPOUNDED ITEM  Diabetic testing supply Check glucose once daily if possible Fasting or morning glucose recommended M, W, F, & Sun if possible Goal= 100-150 Glucose 2 hours after breakfast Tue, after lunch Th & 2 hrs after eve meal Sat if possible Goal = < 180     oxyCODONE-acetaminophen 5-325 MG tablet  Commonly known as:  PERCOCET/ROXICET  Take 1 tablet by mouth every 6 (six) hours as needed for moderate pain.     Pen Needles 32G X 4 MM Misc  1 each by Does not apply route daily.     rosuvastatin 10 MG tablet  Commonly known as:  CRESTOR  Take 10 mg by mouth 2 (two) times a week. Takes on  Sundays and Wednesdays     rosuvastatin 10 MG tablet  Commonly known as:  CRESTOR  TAKE 1 TABLET EVERY SUNDAY AND WEDNESDAY        Prescriptions given: Percocet#30 No Refill  Disposition: home  Patient's condition: is Good  Follow up: 1. Dr. Trula Slade in 2 weeks. 2. Dr. Linna Darner in 1 week to check renal function and DM 3. Dr. Ron Parker in 1 week to f/u chest pain   Leontine Locket, PA-C [facility] [phone]  --- For [facility] use --- Instructions: Press F2 to tab through selections.  Delete question if not applicable.   Modified Rankin score at D/C (0-6): 0  IV medication needed for:  1. Hypertension: No 2. Hypotension: No  Post-op Complications: Yes  1. Post-op CVA or TIA: No  If yes: Event classification (right eye, left  eye, right cortical, left cortical, verterobasilar, other): n/a  If yes: Timing of event (intra-op, <6 hrs post-op, >=6 hrs post-op, unknown): n/a  2. CN injury: No  If yes: CN n/a injuried   3. Myocardial infarction: No  If yes: Dx by (EKG or clinical, Troponin): n/a  4.  CHF: No  5.  Dysrhythmia (new): No  6. Wound infection: No  7. Reperfusion symptoms: No  8. Return to OR: No  If yes: return to OR for (bleeding, neurologic, other CEA incision, other): n/a  Discharge medications: Statin use:  Yes If No: '[ ]'$  For Medical reasons, '[ ]'$  Non-compliant, '[ ]'$  Not-indicated ASA use:  Yes  If No: '[ ]'$  For Medical reasons, '[ ]'$  Non-compliant, '[ ]'$  Not-indicated Beta blocker use:  Yes If No: '[ ]'$  For Medical reasons, '[ ]'$  Non-compliant, '[ ]'$  Not-indicated ACE-Inhibitor use:  Yes If No: '[ ]'$  For Medical reasons, '[ ]'$  Non-compliant, '[ ]'$  Not-indicated ARB use:  No P2Y12 Antagonist use: Yes, [ x] Plavix, '[ ]'$  Plasugrel, '[ ]'$  Ticlopinine, '[ ]'$  Ticagrelor, '[ ]'$  Other, '[ ]'$  No for medical reason, '[ ]'$  Non-compliant, '[ ]'$  Not-indicated Anti-coagulant use:  No, '[ ]'$  Warfarin, '[ ]'$  Rivaroxaban, '[ ]'$  Dabigatran, '[ ]'$  Other, '[ ]'$  No for medical reason, '[ ]'$  Non-compliant, '[ ]'$  Not-indicated

## 2015-06-14 NOTE — Progress Notes (Addendum)
  Progress Note    06/14/2015 7:49 AM 1 Day Post-Op  Subjective:  Had some pain at the incision, but it is much better.  Denies any more chest pain  Tm 99.5 now afebrile HR  70's-80's  027'X-412'I systolic 78% RA  Filed Vitals:   06/14/15 0400 06/14/15 0724  BP: 128/71   Pulse: 80   Temp:  98.5 F (36.9 C)  Resp: 18      Physical Exam: Neuro:  In tact; alert to place, year, month, and President Lungs:  Non labored Cardiac:  Regular Incision:  C/d/i  CBC    Component Value Date/Time   WBC 7.5 06/13/2015 0604   RBC 3.83* 06/13/2015 0604   HGB 12.1* 06/13/2015 0604   HCT 36.0* 06/13/2015 0604   PLT 150 06/13/2015 0604   MCV 94.0 06/13/2015 0604   MCH 31.6 06/13/2015 0604   MCHC 33.6 06/13/2015 0604   RDW 13.3 06/13/2015 0604   LYMPHSABS 1.8 10/12/2014 1258   MONOABS 0.5 10/12/2014 1258   EOSABS 0.2 10/12/2014 1258   BASOSABS 0.0 10/12/2014 1258    BMET    Component Value Date/Time   NA 140 06/14/2015 0300   K 4.0 06/14/2015 0300   CL 110 06/14/2015 0300   CO2 25 06/14/2015 0300   GLUCOSE 135* 06/14/2015 0300   BUN 15 06/14/2015 0300   CREATININE 1.45* 06/14/2015 0300   CALCIUM 8.5* 06/14/2015 0300   GFRNONAA 42* 06/14/2015 0300   GFRAA 48* 06/14/2015 0300     Intake/Output Summary (Last 24 hours) at 06/14/15 0749 Last data filed at 06/14/15 0400  Gross per 24 hour  Intake 3905.83 ml  Output   1500 ml  Net 2405.83 ml     Assessment/Plan:  This is a 82 y.o. male who is s/p right CEA 1 Day Post-Op  -pt is doing well this am. -cardiac catheterization cancelled yesterday due to confusion and Dr. Tamala Julian felt that after reviewing the images, there would not be anatomy amendable to PCI unless there is LAD disease distal to the LIMA graft and recommends medical management unless recurrent or unstable sx develop. -he has not had any more chest pain overnight. -pt neuro exam is in tact; he is alert to place (after being re-oriented earlier this morning),  month, year and President.  RN states his confusion comes and goes.  This is most likely ICU confusion.  I have opened the blinds and turned on the lights.  -pt needs to mobilize -pt has not ambulated, but was up in the chair for a bit this morning.  Back to bed b/c of increased pain in incision.  Improved with pain medication. -pt SSI increased per DM coordinator recommendations -pt has voided -pt's creatinine has increased over the past couple of days, but has been around 1.4 before admission.  He has good UOP.  Will hold Glipizide and have him f/u with his PCP in 1 week for check on his DM and renal function. -f/u with Cardiology (Dr. Ron Parker) in 1-2 weeks for f/u chest pain in hospital.    Leontine Locket, PA-C [facility] [phone]  I have examined the patient, reviewed and agree with above. Up in chair with no complaints. Wants to go home. Family is present. No confusion this morning. Will touchwith cardiology to planned discharge today if they are okay with outpatient follow-up  Curt Jews, MD 06/14/2015 9:35 AM

## 2015-06-14 NOTE — Progress Notes (Signed)
       Patient Name: [name] Date of Encounter: 06/14/2015    SUBJECTIVE: He is much clear today. No obvious confusion. No chest pain.  TELEMETRY:  Normal sinus rhythm Filed Vitals:   06/14/15 0400 06/14/15 0724 06/14/15 0931 06/14/15 1139  BP: 128/71 118/63 129/65 127/68  Pulse: 80 64 70 69  Temp:  98.5 F (36.9 C)  98.2 F (36.8 C)  TempSrc:  Oral  Oral  Resp: '18 16  17  '$ Height:      Weight:      SpO2: 97% 94%  93%    Intake/Output Summary (Last 24 hours) at 06/14/15 1212 Last data filed at 06/14/15 0958  Gross per 24 hour  Intake 4205.83 ml  Output   1975 ml  Net 2230.83 ml   LABS: Basic Metabolic Panel:  Recent Labs  06/12/15 1640 06/13/15 0604 06/14/15 0300  NA 137 138 140  K 4.3 4.2 4.0  CL 107 108 110  CO2 21* 23 25  GLUCOSE 207* 223* 135*  BUN 22* 19 15  CREATININE 1.28* 1.33* 1.45*  CALCIUM 8.7* 8.3* 8.5*  MG 2.0  --   --    CBC:  Recent Labs  06/12/15 1323 06/13/15 0604  WBC 12.0* 7.5  HGB 13.4 12.1*  HCT 39.9 36.0*  MCV 91.5 94.0  PLT 171 150   Cardiac Enzymes:  Recent Labs  06/12/15 1640 06/12/15 2232 06/13/15 0604  TROPONINI 0.03 0.20* 0.54*   Hemoglobin A1C:  Recent Labs  06/12/15 1323  HGBA1C 8.2*     Radiology/Studies:  No new data  Physical Exam: Blood pressure 127/68, pulse 69, temperature 98.2 F (36.8 C), temperature source Oral, resp. rate 17, height '5\' 9"'$  (1.753 m), weight 190 lb 7.6 oz (86.4 kg), SpO2 93 %. Weight change: 1 lb 12.2 oz (0.8 kg)  Wt Readings from Last 3 Encounters:  06/13/15 190 lb 7.6 oz (86.4 kg)  06/05/15 187 lb 9 oz (85.078 kg)  05/29/15 187 lb 12.8 oz (85.186 kg)    Cardiac reveals scratchy 1/6 systolic murmur Lungs are clear Mild soreness right neck incision  ASSESSMENT:  1. Coronary artery disease, clinically stable. Chest pain associated with concomitant mild elevation in troponin may not have really been related to each other. The patient has severe heart disease  with bypass graft failure and total occlusion of all native vessels. It is likely that the stress of surgery led to demand related elevation in troponin   2. Status post right carotid endarterectomy   Plan:  EKG and troponin I. If no significant change, discharge today would be appropriate from cardiology standpoint.   Patient states that he is not feel up to going home today.  Demetrios Isaacs 06/14/2015, 12:12 PM

## 2015-06-16 ENCOUNTER — Telehealth: Payer: Self-pay | Admitting: Surgery

## 2015-06-16 NOTE — Telephone Encounter (Addendum)
-----   Message from Mena Goes, RN sent at 06/16/2015  9:19 AM EST ----- Regarding: appts   ----- Message -----    From: Gabriel Earing, PA-C    Sent: 06/14/2015   8:42 AM      To: [facility] Charge Pool  Please make appt with his PCP, Dr. Linna Darner [phone] to check kidney function and DM in one week.  I have discontinued his Glipizide.  Also needs an appt with Dr. Ron Parker in the next week to f/u chest pain in the hospital.  Thanks, [facility]  notified patient of appt. with Gaetano Hawthorne, PA at Dr. Clayborn Heron office on 06-23-15 at 11 am for kidney function & DM, Samantha discontinued his Glipizide; and an appt. on 06-23-15 at 1:45 with Dr. Marlou Porch

## 2015-06-23 ENCOUNTER — Encounter: Payer: Self-pay | Admitting: Family

## 2015-06-23 ENCOUNTER — Other Ambulatory Visit: Payer: Medicare Other

## 2015-06-23 ENCOUNTER — Other Ambulatory Visit (INDEPENDENT_AMBULATORY_CARE_PROVIDER_SITE_OTHER): Payer: Medicare Other

## 2015-06-23 ENCOUNTER — Ambulatory Visit: Payer: Medicare Other | Admitting: Family

## 2015-06-23 ENCOUNTER — Telehealth: Payer: Self-pay | Admitting: Surgery

## 2015-06-23 ENCOUNTER — Encounter: Payer: Self-pay | Admitting: Cardiology

## 2015-06-23 ENCOUNTER — Ambulatory Visit (INDEPENDENT_AMBULATORY_CARE_PROVIDER_SITE_OTHER): Payer: Medicare Other | Admitting: Cardiology

## 2015-06-23 ENCOUNTER — Ambulatory Visit (INDEPENDENT_AMBULATORY_CARE_PROVIDER_SITE_OTHER): Payer: Medicare Other | Admitting: Family

## 2015-06-23 VITALS — BP 120/58 | HR 68 | Ht 69.0 in | Wt 192.8 lb

## 2015-06-23 VITALS — BP 128/72 | HR 60 | Temp 97.7°F | Resp 18 | Ht 69.0 in | Wt 194.0 lb

## 2015-06-23 DIAGNOSIS — IMO0002 Reserved for concepts with insufficient information to code with codable children: Secondary | ICD-10-CM

## 2015-06-23 DIAGNOSIS — R7989 Other specified abnormal findings of blood chemistry: Secondary | ICD-10-CM | POA: Diagnosis not present

## 2015-06-23 DIAGNOSIS — I2510 Atherosclerotic heart disease of native coronary artery without angina pectoris: Secondary | ICD-10-CM | POA: Diagnosis not present

## 2015-06-23 DIAGNOSIS — N289 Disorder of kidney and ureter, unspecified: Secondary | ICD-10-CM | POA: Diagnosis not present

## 2015-06-23 DIAGNOSIS — E1365 Other specified diabetes mellitus with hyperglycemia: Secondary | ICD-10-CM

## 2015-06-23 DIAGNOSIS — E1351 Other specified diabetes mellitus with diabetic peripheral angiopathy without gangrene: Secondary | ICD-10-CM | POA: Diagnosis not present

## 2015-06-23 DIAGNOSIS — I6521 Occlusion and stenosis of right carotid artery: Secondary | ICD-10-CM

## 2015-06-23 DIAGNOSIS — Z951 Presence of aortocoronary bypass graft: Secondary | ICD-10-CM | POA: Diagnosis not present

## 2015-06-23 DIAGNOSIS — R778 Other specified abnormalities of plasma proteins: Secondary | ICD-10-CM

## 2015-06-23 DIAGNOSIS — I2583 Coronary atherosclerosis due to lipid rich plaque: Secondary | ICD-10-CM

## 2015-06-23 LAB — RENAL FUNCTION PANEL
ALBUMIN: 4 g/dL (ref 3.5–5.2)
BUN: 22 mg/dL (ref 6–23)
CO2: 27 mEq/L (ref 19–32)
Calcium: 9.4 mg/dL (ref 8.4–10.5)
Chloride: 107 mEq/L (ref 96–112)
Creatinine, Ser: 1.42 mg/dL (ref 0.40–1.50)
GFR: 50.1 mL/min — ABNORMAL LOW (ref >60.00)
GLUCOSE: 249 mg/dL — AB (ref 70–99)
Phosphorus: 3.9 mg/dL (ref 2.3–4.6)
Potassium: 4.8 mEq/L (ref 3.5–5.1)
SODIUM: 142 meq/L (ref 135–145)

## 2015-06-23 NOTE — Patient Instructions (Signed)

## 2015-06-23 NOTE — Assessment & Plan Note (Signed)
Stable and maintained on benazepril for hypertension and kidney protection. Obtain renal panel to determine current status. Follow up pending renal panel results.

## 2015-06-23 NOTE — Progress Notes (Addendum)
Cardiology Office Note    Date:  06/23/2015   ID:  [name], DOB 1925-12-03, MRN [id number]  PCP:  Mauricio Po, FNP  Cardiologist:   Candee Furbish, MD (former Ron Parker)    History of Present Illness:  [name] is a 82 y.o. male who underwent carotid endarterectomy on 06/12/2015, right sided, who later that afternoon developed chest pain with troponin of 0.54 at peak, subsequent a 0.18 and was sent to the cardiac catheterization lab at the request of Dr. Ellyn Hack however the patient was confused and declared that they had the wrong patient and did not one to procedure. Dr. Tamala Julian reviewed the patient's images from previous cardiac catheterization and did not believe there would be anatomy amenable to PCI unless there was LAD disease distal to the LIMA graft. Under this circumstance, the case was canceled and medical management was suggested unless he had recurrent or unstable symptoms.  The next day, he did well without any recurring chest discomfort and confusion had improved. Creatinine was 1.5. He was seen by Dr. Ellyn Hack, Dr. Tamala Julian, Dr. Meda Coffee during the hospital stay. Echocardiogram from 02/06/14 showed EF of 55%, mild aortic and mitral regurgitation. Prior akinesis of the basal inferior segment as well as inferolateral segments. Line status has remained quite stable.  Last cardiac catheterization was 02/05/14 by Dr. Claiborne Billings. Dr. Tamala Julian reviewed these results carefully.  No further confusion. Doing well. No further chest pain. Ambulating well. 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   Past Medical History  Diagnosis Date  . CAD (coronary artery disease)   . Hypertension   . Ejection fraction     45%, echo, March, 2012,   /  Ejection fraction after his MI March, 2013 is 45%. There is posterior basal akinesis with abnormal septal motion.  . Aortic insufficiency     Moderate, echo, March, 2012  . Mitral regurgitation     Moderate, echo, March, 2012  . Pulmonary hypertension  ([place])     Echo, March, 2012, 42 mmHg  . Hx of CABG   . Diabetes mellitus   . Dyslipidemia   . Hypothyroidism   . Bradycardia     Sinus bradycardia  . RBBB (right bundle branch block with left anterior fascicular block)     New July, 2010  . Leg cramps     history of nighttime  . Shingles October 2011    Severe pain to right flank treated  . Ischemia     .Marland KitchenMarland KitchenEF 42%  . Hyperlipidemia   . Dizziness      12/2009 & 12/ 2012  . Abnormal LFTs     LFTs were abnormal with acute MI March, 2013, improving at the time of discharge  . Myocardial infarction ([place]) 3/19-27/2013    ARF, elevated LFTs  . Systolic CHF ([place])   . Elevated CPK     2013, now in 2014 patient on very low dose Crestor  . Hard of hearing   . Nocturia   . Wears glasses   . Arthritis     hands    Past Surgical History  Procedure Laterality Date  . Coronary artery bypass graft  1996    5 vessels  . Back surgery  1999    LS sx- 1999  . Spinal fusion  2000    scar tissue, plate insertion/fusion @ LS spine - 2000  . Cardiac surgery    . Cardiac catheterization  2013    occluded graft; Dr Lia Foyer  .  Left heart catheterization with coronary angiogram N/A 08/03/2011    Procedure: LEFT HEART CATHETERIZATION WITH CORONARY ANGIOGRAM;  Surgeon: Hillary Bow, MD;  Location: [facility] LAB;  Service: Cardiovascular;  Laterality: N/A;  . Left heart catheterization with coronary/graft angiogram N/A 02/05/2014    Procedure: LEFT HEART CATHETERIZATION WITH Beatrix Fetters;  Surgeon: Troy Sine, MD;  Location: [facility] CATH LAB;  Service: Cardiovascular;  Laterality: N/A;  . Eye surgery Bilateral December 2016    bilateral cataract surgery  . Endarterectomy Right 06/12/2015    Procedure: ENDARTERECTOMY CAROTID WITH PATCH ANGIOPLASTY;  Surgeon: Serafina Mitchell, MD;  Location: [place];  Service: Vascular;  Laterality: Right;    Outpatient Prescriptions Prior to Visit  Medication Sig Dispense Refill  . acetaminophen (TYLENOL)  325 MG tablet Take 650 mg by mouth every 6 (six) hours as needed for mild pain or moderate pain.     Marland Kitchen amLODipine (NORVASC) 10 MG tablet TAKE 1 TABLET BY MOUTH EVERY DAY 30 tablet 9  . aspirin EC 81 MG tablet Take 81 mg by mouth every morning.    . benazepril (LOTENSIN) 40 MG tablet TAKE 1 TABLET BY MOUTH EVERY DAY 30 tablet 1  . carvedilol (COREG) 3.125 MG tablet TAKE 1 TABLET (3.125 MG TOTAL) BY MOUTH 2 (TWO) TIMES DAILY WITH A MEAL. 60 tablet 2  . cholecalciferol (VITAMIN D) 1000 UNITS tablet Take 1,000 Units by mouth daily.     . clopidogrel (PLAVIX) 75 MG tablet TAKE 1 TABLET BY MOUTH EVERY DAY 30 tablet 6  . dorzolamide (TRUSOPT) 2 % ophthalmic solution Place 1 drop into both eyes daily.     . furosemide (LASIX) 40 MG tablet Take 20 mg by mouth daily as needed for fluid.    . Insulin Detemir (LEVEMIR FLEXTOUCH) 100 UNIT/ML Pen Inject 45 Units into the skin daily at 10 pm. 15 mL 3  . Insulin Pen Needle (PEN NEEDLES) 32G X 4 MM MISC 1 each by Does not apply route daily. 100 each 11  . isosorbide mononitrate (IMDUR) 30 MG 24 hr tablet TAKE 1 TABLET BY MOUTH EVERY DAY 30 tablet 5  . levothyroxine (SYNTHROID, LEVOTHROID) 50 MCG tablet Take 1 tablet (50 mcg total) by mouth daily before breakfast. 30 tablet 11  . LUMIGAN 0.01 % SOLN Place 1 drop into both eyes at bedtime.   2  . LUMIGAN 0.03 % ophthalmic solution Place into both eyes at bedtime and may repeat dose one time if needed. AS NEEDED FOR DRY EYES    . nitroGLYCERIN (NITROSTAT) 0.4 MG SL tablet Place 0.4 mg under the tongue every 5 (five) minutes as needed for chest pain.    . NONFORMULARY OR COMPOUNDED ITEM Diabetic testing supply Check glucose once daily if possible Fasting or morning glucose recommended M, W, F, & Sun if possible Goal= 100-150 Glucose 2 hours after breakfast Tue, after lunch Th & 2 hrs after eve meal Sat if possible Goal = < 180 1 each 0  . oxyCODONE-acetaminophen (PERCOCET/ROXICET) 5-325 MG tablet Take 1 tablet by  mouth every 6 (six) hours as needed for moderate pain. 30 tablet 0  . rosuvastatin (CRESTOR) 10 MG tablet Take 10 mg by mouth 2 (two) times a week. Takes on Sundays and Wednesdays    . rosuvastatin (CRESTOR) 10 MG tablet TAKE 1 TABLET EVERY SUNDAY AND WEDNESDAY (Patient not taking: Reported on 06/23/2015) 30 tablet 0   No facility-administered medications prior to visit.     Allergies:  Atorvastatin; Folic acid; Nicardipine hcl; and Pravastatin   Social History   Social History  . Marital Status: Married    Spouse Name: N/A  . Number of Children: N/A  . Years of Education: N/A   Occupational History  . PASTOR    Social History Main Topics  . Smoking status: Former Smoker -- 1.00 packs/day for 5 years    Quit date: 05/17/1949  . Smokeless tobacco: Never Used     Comment: Quit at age 32  . Alcohol Use: No  . Drug Use: No  . Sexual Activity: Not Asked   Other Topics Concern  . None   Social History Narrative   Pt lives with wife.     Family History:  The patient's family history includes Diabetes in his brother and sister; Heart attack (age of onset: 67) in his father; Hyperlipidemia in his mother; Hypertension in his son; Lung cancer in his sister. There is no history of Stroke.   ROS:   Please see the history of present illness.    ROS All other systems reviewed and are negative.   PHYSICAL EXAM:   VS:  BP 120/58 mmHg  Pulse 68  Ht '5\' 9"'$  (1.753 m)  Wt 192 lb 12.8 oz (87.454 kg)  BMI 28.46 kg/m2   GEN: Well nourished, well developed, in no acute distress, elderly HEENT: Right-sided carotid endarterectomy scar well healing Neck: no JVD, carotid bruits, or masses Cardiac: RRR; no murmurs, rubs, or gallops,no edema  Respiratory:  clear to auscultation bilaterally, normal work of breathing GI: soft, nontender, nondistended, + BS MS: no deformity or atrophy Skin: warm and dry, no rash Neuro:  Alert and Oriented x 3, Strength and sensation are intact Psych: euthymic  mood, full affect  Wt Readings from Last 3 Encounters:  06/23/15 192 lb 12.8 oz (87.454 kg)  06/23/15 194 lb (87.998 kg)  06/13/15 190 lb 7.6 oz (86.4 kg)      Studies/Labs Reviewed:   EKG:  EKG is not ordered today.    Recent Labs: 06/05/2015: ALT 24 06/12/2015: Magnesium 2.0 06/13/2015: Hemoglobin 12.1*; Platelets 150 06/14/2015: BUN 15; Creatinine, Ser 1.45*; Potassium 4.0; Sodium 140   Lipid Panel    Component Value Date/Time   CHOL 162 02/05/2014 0740   TRIG 176* 02/05/2014 0740   HDL 38* 02/05/2014 0740   CHOLHDL 4.3 02/05/2014 0740   VLDL 35 02/05/2014 0740   LDLCALC 89 02/05/2014 0740   LDLDIRECT 164.1 03/24/2012 1353    Additional studies/ records that were reviewed today include:  Hospital records reviewed, lab work reviewed    ASSESSMENT:    1. Elevated troponin   2. Stenosis of right carotid artery   3. Hx of CABG   4. Coronary artery disease due to lipid rich plaque      PLAN:  In order of problems listed above:  1. Mildly elevated troponin in the setting of carotid endarterectomy as well as chest pain postoperative day 2. He is no longer having the chest discomfort. He feels great. He has been driving. Doing well. More in line with demand ischemia given his underlying CAD. Continue with aggressive secondary prevention. 2. Carotid endarterectomy, right sided, Dr. Trula Slade 3. Prior bypass surgery, last cardiac catheterization in 2015 reviewed carefully by Dr. Tamala Julian in the cardiac catheterization lab. Continuing with medical management. There was distal LAD disease, there was occluded vein graft supplying circumflex, there was occluded vein graft to the diagonal. Medical management 4. Patient did not have any further  chest discomfort on day 2. Had some confusion postoperatively. Continue with aggressive medical management. Noninvasive approach. 5. 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    Medication Adjustments/Labs and Tests Ordered: Current medicines are  reviewed at length with the patient today.  Concerns regarding medicines are outlined above.  Medication changes, Labs and Tests ordered today are listed in the Patient Instructions below. Patient Instructions  Medication Instructions:  The current medical regimen is effective;  continue present plan and medications.  Follow-Up: Follow up in 6 months with Dr. Marlou Porch.  You will receive a letter in the mail 2 months before you are due.  Please call us when you receive this letter to schedule your follow up appointment.  If you need a refill on your cardiac medications before your next appointment, please call your pharmacy.  Thank you for choosing [facility]!!             Signed, Candee Furbish, MD  06/23/2015 2:20 PM    [facility] [place], [place], [place]  [address] Phone: [phone]; Fax: [phone]

## 2015-06-23 NOTE — Assessment & Plan Note (Signed)
Type 2 diabetes with most recent A1c of 8.2 which is slightly above goal of 8.0. Takes medications as prescribed and denies adverse side effects or hypoglycemic events. Continue current dosage of glipizide and Levemir. Maintained on Crestor and benazepril for CAD risk reduction. Due for pneumonia vaccinations. Eye exam is up to date. Due for foot exam at next visit. Continue management per endocrinology.

## 2015-06-23 NOTE — Assessment & Plan Note (Signed)
Recovering well from recent surgery with no complications. Continue medications for risk reduction. Follow up with cardiology as scheduled.

## 2015-06-23 NOTE — Progress Notes (Signed)
Pre visit review using our clinic review tool, if applicable. No additional management support is needed unless otherwise documented below in the visit note. 

## 2015-06-23 NOTE — Progress Notes (Signed)
Subjective:    Patient ID: [name], male    DOB: [birth year], 82 y.o.   MRN: [id number]  Chief Complaint  Patient presents with  . Hospitalization Follow-up    was in the hospital for corotid artery, no concerns    HPI:  [name] is a 82 y.o. male who  has a past medical history of CAD (coronary artery disease); Hypertension; Ejection fraction; Aortic insufficiency; Mitral regurgitation; Pulmonary hypertension ([place]); CABG; Diabetes mellitus; Dyslipidemia; Hypothyroidism; Bradycardia; RBBB (right bundle branch block with left anterior fascicular block); Leg cramps; Shingles (October 2011); Ischemia; Hyperlipidemia; Dizziness; Abnormal LFTs; Myocardial infarction ([place]) (3/19-27/2013); Systolic CHF ([place]); Elevated CPK; Hard of hearing; Nocturia; Wears glasses; and Arthritis. and presents today for a hospital follow up.  Recently admitted to the hospital for an elective carotid endarterectomy of the right carotid artery. Following the procedure he was noted to have chest pain and a cardiac catheterization was recommended. The case was canceled and managed medically. Postop day 2 he was with  normal/baseline neurological exam and no chest pain. Glipizide was held secondary to elevated creatinine and advised to follow-up with primary care for renal function and glucose check. All hospital records were reviewed in detail.   Since leaving the hospital he reports that he has been doing well. No chest pain, shortness of breath or headaches. Reports that he is wanting to exercise. He also had cataract surgery in November and December. 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  Diabetes is currently managed on Levemir and glipizide. Currently taking medication as prescribed. Denies hypoglycemia or adverse side effects. Morning blood sugar has ranged in the 130-150s on average.  Allergies  Allergen Reactions  . Atorvastatin     REACTION: myalgias  . Folic  Acid     REACTION: Face burns  . Nicardipine Hcl     REACTION: Gingival hypertrophy  . Pravastatin     myalgias     Current Outpatient Prescriptions on File Prior to Visit  Medication Sig Dispense Refill  . acetaminophen (TYLENOL) 325 MG tablet Take 650 mg by mouth every 6 (six) hours as needed for mild pain or moderate pain.     Marland Kitchen amLODipine (NORVASC) 10 MG tablet TAKE 1 TABLET BY MOUTH EVERY DAY 30 tablet 9  . aspirin EC 81 MG tablet Take 81 mg by mouth every morning.    . benazepril (LOTENSIN) 40 MG tablet TAKE 1 TABLET BY MOUTH EVERY DAY 30 tablet 1  . carvedilol (COREG) 3.125 MG tablet TAKE 1 TABLET (3.125 MG TOTAL) BY MOUTH 2 (TWO) TIMES DAILY WITH A MEAL. 60 tablet 2  . cholecalciferol (VITAMIN D) 1000 UNITS tablet Take 1,000 Units by mouth daily.     . clopidogrel (PLAVIX) 75 MG tablet TAKE 1 TABLET BY MOUTH EVERY DAY 30 tablet 6  . dorzolamide (TRUSOPT) 2 % ophthalmic solution Place 1 drop into both eyes daily.     . furosemide (LASIX) 40 MG tablet Take 20 mg by mouth daily as needed for fluid.    . Insulin Detemir (LEVEMIR FLEXTOUCH) 100 UNIT/ML Pen Inject 45 Units into the skin daily at 10 pm. 15 mL 3  . Insulin Pen Needle (PEN NEEDLES) 32G X 4 MM MISC 1 each by Does not apply route daily. 100 each 11  . isosorbide mononitrate (IMDUR) 30 MG 24 hr tablet TAKE 1 TABLET BY MOUTH EVERY DAY 30 tablet 5  . levothyroxine (SYNTHROID, LEVOTHROID) 50 MCG tablet Take 1  tablet (50 mcg total) by mouth daily before breakfast. 30 tablet 11  . LUMIGAN 0.01 % SOLN Place 1 drop into both eyes at bedtime.   2  . LUMIGAN 0.03 % ophthalmic solution Place into both eyes at bedtime and may repeat dose one time if needed.     . nitroGLYCERIN (NITROSTAT) 0.4 MG SL tablet Place 0.4 mg under the tongue every 5 (five) minutes as needed for chest pain.    . NONFORMULARY OR COMPOUNDED ITEM Diabetic testing supply Check glucose once daily if possible Fasting or morning glucose recommended M, W, F, & Sun if  possible Goal= 100-150 Glucose 2 hours after breakfast Tue, after lunch Th & 2 hrs after eve meal Sat if possible Goal = < 180 1 each 0  . oxyCODONE-acetaminophen (PERCOCET/ROXICET) 5-325 MG tablet Take 1 tablet by mouth every 6 (six) hours as needed for moderate pain. 30 tablet 0  . rosuvastatin (CRESTOR) 10 MG tablet Take 10 mg by mouth 2 (two) times a week. Takes on Sundays and Wednesdays    . rosuvastatin (CRESTOR) 10 MG tablet TAKE 1 TABLET EVERY SUNDAY AND WEDNESDAY 30 tablet 0  . [DISCONTINUED] pravastatin (PRAVACHOL) 20 MG tablet Take 20 mg by mouth daily.       No current facility-administered medications on file prior to visit.     Past Surgical History  Procedure Laterality Date  . Coronary artery bypass graft  1996    5 vessels  . Back surgery  1999    LS sx- 1999  . Spinal fusion  2000    scar tissue, plate insertion/fusion @ LS spine - 2000  . Cardiac surgery    . Cardiac catheterization  2013    occluded graft; Dr Lia Foyer  . Left heart catheterization with coronary angiogram N/A 08/03/2011    Procedure: LEFT HEART CATHETERIZATION WITH CORONARY ANGIOGRAM;  Surgeon: Hillary Bow, MD;  Location[facility] C[facility] LAB;  Service: Cardiovascular;  Laterality: N/A;  . Left heart catheterization with coronary/graft angiogram N/A 02/05/2014    Procedure: LEFT HEART CATHETERIZATION WITH Beatrix Fetters;  Surgeon: Troy Sine, MD;  Location: [facility] CATH LAB;  Service: Cardiovascular;  Laterality: N/A;  . Eye surgery Bilateral December 2016    bilateral cataract surgery  . Endarterectomy Right 06/12/2015    Procedure: ENDARTERECTOMY CAROTID WITH PATCH ANGIOPLASTY;  Surgeon: Serafina Mitchell, MD;  Location: [facility] OR;  Service: Vascular;  Laterality: Right;    Review of Systems  Constitutional: Negative for fever and chills.  Respiratory: Negative for chest tightness and shortness of breath.   Cardiovascular: Negative for chest pain, palpitations and leg swelling.  Neurological:  Negative for dizziness, light-headedness and headaches.      Objective:    BP 128/72 mmHg  Pulse 60  Temp(Src) 97.7 F (36.5 C) (Oral)  Resp 18  Ht '5\' 9"'$  (1.753 m)  Wt 194 lb (87.998 kg)  BMI 28.64 kg/m2  SpO2 97% Nursing note and vital signs reviewed.  Physical Exam  Constitutional: He is oriented to person, place, and time. He appears well-developed and well-nourished. No distress.  Cardiovascular: Normal rate, regular rhythm, normal heart sounds and intact distal pulses.   Pulmonary/Chest: Effort normal and breath sounds normal.  Neurological: He is alert and oriented to person, place, and time.  Skin: Skin is warm and dry.  Psychiatric: He has a normal mood and affect. His behavior is normal. Judgment and thought content normal.       Assessment & Plan:  Problem List Items Addressed This Visit      Cardiovascular and Mediastinum   Carotid stenosis - Primary    Recovering well from recent surgery with no complications. Continue medications for risk reduction. Follow up with cardiology as scheduled.       Relevant Orders   Renal Function Panel     Endocrine   DM (diabetes mellitus), secondary, uncontrolled, with peripheral vascular complications ([place])    Type 2 diabetes with most recent A1c of 8.2 which is slightly above goal of 8.0. Takes medications as prescribed and denies adverse side effects or hypoglycemic events. Continue current dosage of glipizide and Levemir. Maintained on Crestor and benazepril for CAD risk reduction. Due for pneumonia vaccinations. Eye exam is up to date. Due for foot exam at next visit. Continue management per endocrinology.       Relevant Orders   Renal Function Panel     Genitourinary   Renal insufficiency    Stable and maintained on benazepril for hypertension and kidney protection. Obtain renal panel to determine current status. Follow up pending renal panel results.       Relevant Orders   Renal Function Panel

## 2015-06-23 NOTE — Patient Instructions (Signed)
Thank you for choosing [facility].  Summary/Instructions:  Please stop by the lab on the basement level of the building for your blood work. Your results will be released to [place] (or called to you) after review, usually within 72 hours after test completion. If any changes need to be made, you will be notified at that same time.  If your symptoms worsen or fail to improve, please contact our office for further instruction, or in case of emergency go directly to the emergency room at the closest medical facility.   Please continue to take your medication as prescribed.  Follow up in 3 months for review of your chronic conditions.

## 2015-06-23 NOTE — Telephone Encounter (Signed)
Returned phone call from pts wife regarding follow up.  No answer, LVM for pts wife to call back to schedule, dpm

## 2015-06-24 ENCOUNTER — Encounter: Payer: Self-pay | Admitting: Surgery

## 2015-06-24 ENCOUNTER — Telehealth: Payer: Self-pay | Admitting: Family

## 2015-06-24 DIAGNOSIS — N183 Chronic kidney disease, stage 3 unspecified: Secondary | ICD-10-CM | POA: Insufficient documentation

## 2015-06-24 NOTE — Telephone Encounter (Signed)
Please inform patient that his kidney function looks good and is improved since leaving the hospital.

## 2015-06-27 NOTE — Telephone Encounter (Signed)
LVM for pt to call back.

## 2015-06-30 ENCOUNTER — Ambulatory Visit (INDEPENDENT_AMBULATORY_CARE_PROVIDER_SITE_OTHER): Payer: Medicare Other | Admitting: Surgery

## 2015-06-30 ENCOUNTER — Encounter: Payer: Self-pay | Admitting: Surgery

## 2015-06-30 VITALS — BP 132/71 | HR 58 | Temp 97.2°F | Ht 69.0 in | Wt 191.4 lb

## 2015-06-30 DIAGNOSIS — I6521 Occlusion and stenosis of right carotid artery: Secondary | ICD-10-CM

## 2015-06-30 NOTE — Progress Notes (Signed)
Patient name: [name] MRN: [id number] DOB: Dec 04, 1925 Sex: male     Chief Complaint  Patient presents with  . Routine Post Op    2 wk fu - c/o pain     HISTORY OF PRESENT ILLNESS: The patient is back for his first postoperative follow-up.  On 06/12/2015, he underwent right carotid endarterectomy with bovine pericardial patch angioplasty.  Intraoperative findings included a near occlusive plaque which resembled a ruptured plaque with mobile debris within the artery.  His postoperative course was uncomplicated.  The patient returns today with complaints of pain in his neck.  He states that it hurts when he turns his head from side to side.  He is not taking anything but Tylenol.  Past Medical History  Diagnosis Date  . CAD (coronary artery disease)   . Hypertension   . Ejection fraction     45%, echo, March, 2012,   /  Ejection fraction after his MI March, 2013 is 45%. There is posterior basal akinesis with abnormal septal motion.  . Aortic insufficiency     Moderate, echo, March, 2012  . Mitral regurgitation     Moderate, echo, March, 2012  . Pulmonary hypertension ([place])     Echo, March, 2012, 42 mmHg  . Hx of CABG   . Diabetes mellitus   . Dyslipidemia   . Hypothyroidism   . Bradycardia     Sinus bradycardia  . RBBB (right bundle branch block with left anterior fascicular block)     New July, 2010  . Leg cramps     history of nighttime  . Shingles October 2011    Severe pain to right flank treated  . Ischemia     .Marland KitchenMarland KitchenEF 42%  . Hyperlipidemia   . Dizziness      12/2009 & 12/ 2012  . Abnormal LFTs     LFTs were abnormal with acute MI March, 2013, improving at the time of discharge  . Myocardial infarction ([place]) 3/19-27/2013    ARF, elevated LFTs  . Systolic CHF ([place])   . Elevated CPK     2013, now in 2014 patient on very low dose Crestor  . Hard of hearing   . Nocturia   . Wears glasses   . Arthritis     hands    Past Surgical History  Procedure  Laterality Date  . Coronary artery bypass graft  1996    5 vessels  . Back surgery  1999    LS sx- 1999  . Spinal fusion  2000    scar tissue, plate insertion/fusion @ [facility] spine - 2000  . Cardiac surgery    . Cardiac catheterization  2013    occluded graft; Dr Lia Foyer  . Left heart catheterization with coronary angiogram N/A 08/03/2011    Procedure: LEFT HEART CATHETERIZATION WITH CORONARY ANGIOGRAM;  Surgeon: Hillary Bow, MD;  Location[facility] CATH LAB;  Service: Cardiovascular;  Laterality: N/A;  . Left heart catheterization with coronary/graft angiogram N/A 02/05/2014    Procedure: LEFT HEART CATHETERIZATION WITH Beatrix Fetters;  Surgeon: Troy Sine, MD;  Location: [facility] CATH LAB;  Service: Cardiovascular;  Laterality: N/A;  . Eye surgery Bilateral December 2016    bilateral cataract surgery  . Endarterectomy Right 06/12/2015    Procedure: ENDARTERECTOMY CAROTID WITH PATCH ANGIOPLASTY;  Surgeon: Serafina Mitchell, MD;  Location: [facility] OR;  Service: Vascular;  Laterality: Right;    Social History   Social History  . Marital Status: Married  Spouse Name: N/A  . Number of Children: N/A  . Years of Education: N/A   Occupational History  . PASTOR    Social History Main Topics  . Smoking status: Former Smoker -- 1.00 packs/day for 5 years    Quit date: 05/17/1949  . Smokeless tobacco: Never Used     Comment: Quit at age 82  . Alcohol Use: No  . Drug Use: No  . Sexual Activity: Not on file   Other Topics Concern  . Not on file   Social History Narrative   Pt lives with wife.    Family History  Problem Relation Age of Onset  . Diabetes Sister   . Heart attack Father 26  . Lung cancer Sister   . Stroke Neg Hx   . Diabetes Brother   . Hyperlipidemia Mother   . Hypertension Son     Allergies as of 06/30/2015 - Review Complete 06/30/2015  Allergen Reaction Noted  . Atorvastatin  09/17/2009  . Folic acid    . Nicardipine hcl    . Pravastatin  11/30/2010     Current Outpatient Prescriptions on File Prior to Visit  Medication Sig Dispense Refill  . acetaminophen (TYLENOL) 325 MG tablet Take 650 mg by mouth every 6 (six) hours as needed for mild pain or moderate pain.     Marland Kitchen amLODipine (NORVASC) 10 MG tablet TAKE 1 TABLET BY MOUTH EVERY DAY 30 tablet 9  . aspirin EC 81 MG tablet Take 81 mg by mouth every morning.    . benazepril (LOTENSIN) 40 MG tablet TAKE 1 TABLET BY MOUTH EVERY DAY 30 tablet 1  . carvedilol (COREG) 3.125 MG tablet TAKE 1 TABLET (3.125 MG TOTAL) BY MOUTH 2 (TWO) TIMES DAILY WITH A MEAL. 60 tablet 2  . cholecalciferol (VITAMIN D) 1000 UNITS tablet Take 1,000 Units by mouth daily.     . clopidogrel (PLAVIX) 75 MG tablet TAKE 1 TABLET BY MOUTH EVERY DAY 30 tablet 6  . dorzolamide (TRUSOPT) 2 % ophthalmic solution Place 1 drop into both eyes daily.     . furosemide (LASIX) 40 MG tablet Take 20 mg by mouth daily as needed for fluid.    Marland Kitchen glipiZIDE (GLUCOTROL XL) 5 MG 24 hr tablet TAKE 2 TABLET (5 MG TOTAL) BY MOUTH DAILY WITH BREAKFAST.  2  . Insulin Detemir (LEVEMIR FLEXTOUCH) 100 UNIT/ML Pen Inject 45 Units into the skin daily at 10 pm. 15 mL 3  . Insulin Pen Needle (PEN NEEDLES) 32G X 4 MM MISC 1 each by Does not apply route daily. 100 each 11  . isosorbide mononitrate (IMDUR) 30 MG 24 hr tablet TAKE 1 TABLET BY MOUTH EVERY DAY 30 tablet 5  . levothyroxine (SYNTHROID, LEVOTHROID) 50 MCG tablet Take 1 tablet (50 mcg total) by mouth daily before breakfast. 30 tablet 11  . LUMIGAN 0.01 % SOLN Place 1 drop into both eyes at bedtime.   2  . LUMIGAN 0.03 % ophthalmic solution Place into both eyes at bedtime and may repeat dose one time if needed. AS NEEDED FOR DRY EYES    . nitroGLYCERIN (NITROSTAT) 0.4 MG SL tablet Place 0.4 mg under the tongue every 5 (five) minutes as needed for chest pain.    . NONFORMULARY OR COMPOUNDED ITEM Diabetic testing supply Check glucose once daily if possible Fasting or morning glucose recommended M, W,  F, & Sun if possible Goal= 100-150 Glucose 2 hours after breakfast Tue, after lunch Th & 2 hrs after eve  meal Sat if possible Goal = < 180 1 each 0  . oxyCODONE-acetaminophen (PERCOCET/ROXICET) 5-325 MG tablet Take 1 tablet by mouth every 6 (six) hours as needed for moderate pain. 30 tablet 0  . rosuvastatin (CRESTOR) 10 MG tablet Take 10 mg by mouth 2 (two) times a week. Takes on Sundays and Wednesdays    . [DISCONTINUED] pravastatin (PRAVACHOL) 20 MG tablet Take 20 mg by mouth daily.       No current facility-administered medications on file prior to visit.       PHYSICAL EXAMINATION:   Vital signs are  Filed Vitals:   06/30/15 0856 06/30/15 0858  BP: 134/74 132/71  Pulse: 58   Temp: 97.2 F (36.2 C)   TempSrc: Oral   Height: '5\' 9"'$  (1.753 m)   Weight: 191 lb 6.4 oz (86.818 kg)   SpO2: 99%    Body mass index is 28.25 kg/(m^2). General: The patient appears their stated age. The right carotid incision is healing appropriately.  It is soft.  There is no hematoma.  He is neurologically intact there is no bruit    Assessment: Status post right carotid endarterectomy Plan: I have tried to reassure the patient that he is doing very well, 2 weeks status post right carotid endarterectomy.  I have encouraged him to increase his activity level.  He will follow up with me in a month with a repeat carotid ultrasound.  He will contact me in one month if he is not significantly improved.  Eldridge Abrahams, M.D. [facility] Office: [phone] Pager:  [phone]

## 2015-06-30 NOTE — Telephone Encounter (Signed)
Pt's spouse advised

## 2015-07-01 ENCOUNTER — Encounter: Payer: Self-pay | Admitting: Surgery

## 2015-07-02 NOTE — Addendum Note (Signed)
Addended by: Mena Goes on: 07/02/2015 10:15 AM   Modules accepted: Orders

## 2015-07-16 ENCOUNTER — Other Ambulatory Visit: Payer: Self-pay | Admitting: Cardiology

## 2015-07-23 ENCOUNTER — Other Ambulatory Visit: Payer: Self-pay | Admitting: Cardiology

## 2015-07-30 ENCOUNTER — Other Ambulatory Visit: Payer: Self-pay | Admitting: *Deleted

## 2015-07-30 MED ORDER — GLIPIZIDE ER 5 MG PO TB24: ORAL_TABLET | ORAL | Status: DC

## 2015-08-13 ENCOUNTER — Other Ambulatory Visit: Payer: Self-pay | Admitting: Cardiology

## 2015-08-15 ENCOUNTER — Other Ambulatory Visit: Payer: Self-pay | Admitting: Cardiology

## 2015-08-15 ENCOUNTER — Ambulatory Visit: Payer: Medicare Other | Admitting: Nurse Practitioner

## 2015-08-26 ENCOUNTER — Other Ambulatory Visit: Payer: Self-pay | Admitting: Cardiology

## 2015-08-27 ENCOUNTER — Ambulatory Visit: Payer: Medicare Other | Admitting: Internal Medicine

## 2015-08-27 DIAGNOSIS — Z0289 Encounter for other administrative examinations: Secondary | ICD-10-CM

## 2015-09-01 ENCOUNTER — Other Ambulatory Visit: Payer: Self-pay

## 2015-09-10 ENCOUNTER — Other Ambulatory Visit: Payer: Self-pay | Admitting: *Deleted

## 2015-09-10 MED ORDER — ROSUVASTATIN CALCIUM 10 MG PO TABS: 10.0000 mg | ORAL_TABLET | ORAL | Status: DC

## 2015-09-25 ENCOUNTER — Ambulatory Visit: Payer: Medicare Other | Admitting: Family

## 2015-10-02 ENCOUNTER — Other Ambulatory Visit (INDEPENDENT_AMBULATORY_CARE_PROVIDER_SITE_OTHER): Payer: Medicare Other

## 2015-10-02 ENCOUNTER — Telehealth: Payer: Self-pay | Admitting: Family

## 2015-10-02 ENCOUNTER — Encounter: Payer: Self-pay | Admitting: Family

## 2015-10-02 ENCOUNTER — Ambulatory Visit (INDEPENDENT_AMBULATORY_CARE_PROVIDER_SITE_OTHER): Payer: Medicare Other | Admitting: Family

## 2015-10-02 VITALS — BP 134/70 | HR 61 | Temp 98.0°F | Resp 16 | Ht 69.0 in | Wt 186.0 lb

## 2015-10-02 DIAGNOSIS — I5022 Chronic systolic (congestive) heart failure: Secondary | ICD-10-CM | POA: Diagnosis not present

## 2015-10-02 DIAGNOSIS — I6521 Occlusion and stenosis of right carotid artery: Secondary | ICD-10-CM | POA: Diagnosis not present

## 2015-10-02 DIAGNOSIS — E038 Other specified hypothyroidism: Secondary | ICD-10-CM

## 2015-10-02 LAB — BASIC METABOLIC PANEL
BUN: 28 mg/dL — ABNORMAL HIGH (ref 6–23)
CHLORIDE: 108 meq/L (ref 96–112)
CO2: 27 mEq/L (ref 19–32)
CREATININE: 1.63 mg/dL — AB (ref 0.40–1.50)
Calcium: 9.7 mg/dL (ref 8.4–10.5)
GFR: 42.7 mL/min — ABNORMAL LOW (ref >60.00)
Glucose, Bld: 131 mg/dL — ABNORMAL HIGH (ref 70–99)
POTASSIUM: 4.9 meq/L (ref 3.5–5.1)
SODIUM: 142 meq/L (ref 135–145)

## 2015-10-02 LAB — TSH: TSH: 3.18 u[IU]/mL (ref 0.35–4.50)

## 2015-10-02 NOTE — Progress Notes (Signed)
Pre visit review using our clinic review tool, if applicable. No additional management support is needed unless otherwise documented below in the visit note. 

## 2015-10-02 NOTE — Patient Instructions (Signed)
Thank you for choosing [facility].  Summary/Instructions:  Please continue to take your medication as prescribed.   Please stop by the lab on the basement level of the building for your blood work. Your results will be released to [place] (or called to you) after review, usually within 72 hours after test completion. If any changes need to be made, you will be notified at that same time.

## 2015-10-02 NOTE — Assessment & Plan Note (Signed)
Heart failure appears stable with current regimen LIMA with no chest pain, shortness of breath, or adventitious lung sounds. Continue current dosage of carvedilol, furosemide, and isosorbide mononitrate. Continue to monitor weight at home. Advised to follow-up for weight gain if necessary.

## 2015-10-02 NOTE — Progress Notes (Signed)
Subjective:    Patient ID: [name], male    DOB: 1926-04-17, 82 y.o.   MRN: [id number]  Chief Complaint  Patient presents with  . Follow-up    HPI:  [name] is a 81 y.o. male who  has a past medical history of CAD (coronary artery disease); Hypertension; Ejection fraction; Aortic insufficiency; Mitral regurgitation; Pulmonary hypertension ([place]); CABG; Diabetes mellitus; Dyslipidemia; Hypothyroidism; Bradycardia; RBBB (right bundle branch block with left anterior fascicular block); Leg cramps; Shingles (October 2011); Ischemia; Hyperlipidemia; Dizziness; Abnormal LFTs; Myocardial infarction ([place]) (3/19-27/2013); Systolic CHF ([place]); Elevated CPK; Hard of hearing; Nocturia; Wears glasses; and Arthritis. and presents today for an office follow up.  1.) Hypothyroidism - Currently maintained on levothyroxine. Reports taking the medication as prescribed and denies adverse side effects. Denies fatigue, temperature changes or changes to skin/hair/nails.  Lab Results  Component Value Date   TSH 3.60 03/14/2014     2.) Chronic systolic heart failure - Currently maintained on benazepril, carvedilol, furosemide, and isosorbide mononitrate. Reports taking the medications as prescribed and denies adverse side effects. Endorses weights have been stable with no chest pain, shortness of breath or lower extremity edema.    Allergies  Allergen Reactions  . Atorvastatin     REACTION: myalgias  . Folic Acid     REACTION: Face burns  . Nicardipine Hcl     REACTION: Gingival hypertrophy  . Pravastatin     myalgias     Current Outpatient Prescriptions on File Prior to Visit  Medication Sig Dispense Refill  . acetaminophen (TYLENOL) 325 MG tablet Take 650 mg by mouth every 6 (six) hours as needed for mild pain or moderate pain.     Marland Kitchen amLODipine (NORVASC) 10 MG tablet TAKE 1 TABLET BY MOUTH EVERY DAY 30 tablet 9  . aspirin EC 81 MG tablet Take 81 mg by mouth every morning.    .  benazepril (LOTENSIN) 40 MG tablet TAKE 1 TABLET BY MOUTH EVERY DAY 30 tablet 5  . carvedilol (COREG) 3.125 MG tablet TAKE 1 TABLET (3.125 MG TOTAL) BY MOUTH 2 (TWO) TIMES DAILY WITH A MEAL. 60 tablet 5  . cholecalciferol (VITAMIN D) 1000 UNITS tablet Take 1,000 Units by mouth daily.     . clopidogrel (PLAVIX) 75 MG tablet TAKE 1 TABLET BY MOUTH EVERY DAY 30 tablet 5  . dorzolamide (TRUSOPT) 2 % ophthalmic solution Place 1 drop into both eyes daily.     . furosemide (LASIX) 40 MG tablet Take 20 mg by mouth daily as needed for fluid.    . furosemide (LASIX) 40 MG tablet Take 0.5 tablets (20 mg total) by mouth as needed. 45 tablet 1  . glipiZIDE (GLUCOTROL XL) 5 MG 24 hr tablet TAKE 2 TABLET (5 MG TOTAL) BY MOUTH DAILY WITH BREAKFAST. 60 tablet 2  . Insulin Detemir (LEVEMIR FLEXTOUCH) 100 UNIT/ML Pen Inject 45 Units into the skin daily at 10 pm. 15 mL 3  . Insulin Pen Needle (PEN NEEDLES) 32G X 4 MM MISC 1 each by Does not apply route daily. 100 each 11  . isosorbide mononitrate (IMDUR) 30 MG 24 hr tablet TAKE 1 TABLET BY MOUTH EVERY DAY 30 tablet 11  . isosorbide mononitrate (IMDUR) 30 MG 24 hr tablet TAKE 1 TABLET BY MOUTH EVERY DAY 30 tablet 3  . levothyroxine (SYNTHROID, LEVOTHROID) 50 MCG tablet Take 1 tablet (50 mcg total) by mouth daily before breakfast. 30 tablet 11  . LUMIGAN 0.01 % SOLN Place 1 drop  into both eyes at bedtime.   2  . LUMIGAN 0.03 % ophthalmic solution Place into both eyes at bedtime and may repeat dose one time if needed. AS NEEDED FOR DRY EYES    . nitroGLYCERIN (NITROSTAT) 0.4 MG SL tablet Place 0.4 mg under the tongue every 5 (five) minutes as needed for chest pain.    . NONFORMULARY OR COMPOUNDED ITEM Diabetic testing supply Check glucose once daily if possible Fasting or morning glucose recommended M, W, F, & Sun if possible Goal= 100-150 Glucose 2 hours after breakfast Tue, after lunch Th & 2 hrs after eve meal Sat if possible Goal = < 180 1 each 0  .  oxyCODONE-acetaminophen (PERCOCET/ROXICET) 5-325 MG tablet Take 1 tablet by mouth every 6 (six) hours as needed for moderate pain. 30 tablet 0  . rosuvastatin (CRESTOR) 10 MG tablet Take 1 tablet (10 mg total) by mouth 2 (two) times a week. Takes on Sundays and Wednesdays 90 tablet 1  . [DISCONTINUED] pravastatin (PRAVACHOL) 20 MG tablet Take 20 mg by mouth daily.       No current facility-administered medications on file prior to visit.     Past Surgical History  Procedure Laterality Date  . Coronary artery bypass graft  1996    5 vessels  . Back surgery  1999    LS sx- 1999  . Spinal fusion  2000    scar tissue, plate insertion/fusion @ LS spine - 2000  . Cardiac surgery    . Cardiac catheterization  2013    occluded graft; Dr Lia Foyer  . Left heart catheterization with coronary angiogram N/A 08/03/2011    Procedure: LEFT HEART CATHETERIZATION WITH CORONARY ANGIOGRAM;  Surgeon: Hillary Bow, MD;  Location: [facility] CATH LAB;  Service: Cardiovascular;  Laterality: N/A;  . Left heart catheterization with coronary/graft angiogram N/A 02/05/2014    Procedure: LEFT HEART CATHETERIZATION WITH Beatrix Fetters;  Surgeon: Troy Sine, MD;  Location: [facility] CATH LAB;  Service: Cardiovascular;  Laterality: N/A;  . Eye surgery Bilateral December 2016    bilateral cataract surgery  . Endarterectomy Right 06/12/2015    Procedure: ENDARTERECTOMY CAROTID WITH PATCH ANGIOPLASTY;  Surgeon: Serafina Mitchell, MD;  Location: [facility] OR;  Service: Vascular;  Laterality: Right;     Review of Systems  Constitutional: Negative for fever and chills.  Respiratory: Negative for chest tightness, shortness of breath and wheezing.   Cardiovascular: Negative for chest pain, palpitations and leg swelling.      Objective:    BP 134/70 mmHg  Pulse 61  Temp(Src) 98 F (36.7 C) (Oral)  Resp 16  Ht '5\' 9"'$  (1.753 m)  Wt 186 lb (84.369 kg)  BMI 27.45 kg/m2  SpO2 94% Nursing note and vital signs  reviewed.  Physical Exam  Constitutional: He is oriented to person, place, and time. He appears well-developed and well-nourished. No distress.  Cardiovascular: Normal rate, regular rhythm, normal heart sounds and intact distal pulses.   Pulmonary/Chest: Effort normal and breath sounds normal.  Neurological: He is alert and oriented to person, place, and time.  Skin: Skin is warm and dry.  Psychiatric: He has a normal mood and affect. His behavior is normal. Judgment and thought content normal.       Assessment & Plan:   Problem List Items Addressed This Visit      Cardiovascular and Mediastinum   Chronic systolic CHF (congestive heart failure) (HCC) - Primary (Chronic)    Heart failure appears stable with current regimen  LIMA with no chest pain, shortness of breath, or adventitious lung sounds. Continue current dosage of carvedilol, furosemide, and isosorbide mononitrate. Continue to monitor weight at home. Advised to follow-up for weight gain if necessary.      Relevant Orders   Basic Metabolic Panel (BMET)     Endocrine   Hypothyroidism-TSH 9 (Chronic)    Hypothyroidism appears stable with current regimen and overdue for TSH. Obtain TSH. Continue current dosage of levothyroxine pending TSH results.      Relevant Orders   TSH       I am having Mr. Ishmael maintain his LUMIGAN, dorzolamide, cholecalciferol, acetaminophen, NONFORMULARY OR COMPOUNDED ITEM, furosemide, aspirin EC, nitroGLYCERIN, LUMIGAN, levothyroxine, amLODipine, Pen Needles, Insulin Detemir, oxyCODONE-acetaminophen, clopidogrel, carvedilol, benazepril, glipiZIDE, isosorbide mononitrate, isosorbide mononitrate, furosemide, and rosuvastatin.   Follow-up: No Follow-up on file.  Mauricio Po, FNP

## 2015-10-02 NOTE — Telephone Encounter (Signed)
Please inform patient that his thyroid function is stable and to please continue to take his thyroid medication as prescribed. His kidney function and electrolytes remain stable.

## 2015-10-02 NOTE — Assessment & Plan Note (Signed)
Hypothyroidism appears stable with current regimen and overdue for TSH. Obtain TSH. Continue current dosage of levothyroxine pending TSH results.

## 2015-10-03 NOTE — Telephone Encounter (Signed)
LVM letting pt know results.

## 2015-10-30 ENCOUNTER — Other Ambulatory Visit: Payer: Self-pay | Admitting: Internal Medicine

## 2015-11-06 DIAGNOSIS — H524 Presbyopia: Secondary | ICD-10-CM | POA: Diagnosis not present

## 2015-11-06 DIAGNOSIS — H40063 Primary angle closure without glaucoma damage, bilateral: Secondary | ICD-10-CM | POA: Diagnosis not present

## 2015-11-06 DIAGNOSIS — H26493 Other secondary cataract, bilateral: Secondary | ICD-10-CM | POA: Diagnosis not present

## 2015-11-06 LAB — HM DIABETES EYE EXAM

## 2015-11-26 ENCOUNTER — Encounter: Payer: Self-pay | Admitting: Cardiology

## 2015-12-08 ENCOUNTER — Encounter: Payer: Self-pay | Admitting: Internal Medicine

## 2015-12-08 ENCOUNTER — Ambulatory Visit (INDEPENDENT_AMBULATORY_CARE_PROVIDER_SITE_OTHER): Payer: Medicare Other | Admitting: Internal Medicine

## 2015-12-08 VITALS — BP 138/62 | HR 56 | Temp 97.7°F | Wt 193.0 lb

## 2015-12-08 DIAGNOSIS — E1165 Type 2 diabetes mellitus with hyperglycemia: Secondary | ICD-10-CM

## 2015-12-08 DIAGNOSIS — I6521 Occlusion and stenosis of right carotid artery: Secondary | ICD-10-CM

## 2015-12-08 DIAGNOSIS — E1159 Type 2 diabetes mellitus with other circulatory complications: Secondary | ICD-10-CM

## 2015-12-08 LAB — POCT GLYCOSYLATED HEMOGLOBIN (HGB A1C): HEMOGLOBIN A1C: 7.9

## 2015-12-08 NOTE — Addendum Note (Signed)
Addended by: Pilar Grammes on: 12/08/2015 04:29 PM   Modules accepted: Orders

## 2015-12-08 NOTE — Progress Notes (Signed)
Patient ID: [name], male   DOB: 1925/10/14, 82 y.o.   MRN: [id number]  HPI: [name] is a 82 y.o.-year-old male, returning for follow-up for DM2, dx in 2008, insulin-dependent since 2015, uncontrolled, with complications (CAD, s/p MI and CABG in 1994, also s/p stent; CKD). Last 6 mo ago.  He will have a CEA later this month.  Last hemoglobin A1c was: Lab Results  Component Value Date   HGBA1C 8.2 (H) 06/12/2015   HGBA1C 7.6 05/29/2015   HGBA1C 7.3 02/25/2015   Pt is on a regimen of: - Levemir 40 >> 45 units at bedtime - Glipizide XL 10 mg daily Could not afford Onglyza 5 mg in a.m  Pt checks his sugars 1x a day in am and they are: - am: 138-141 (200, 245 - last 2 days>> 125-219, 233 >> 107-155, 169, 210 >> 140-175, 216, 223 >> 114-168, 186, 273 - 2h after b'fast: n/c - before lunch: n/c - 2h after lunch: n/c >> 309 >> 340 >> 145 >> n/c - before dinner: n/c >> 196, 205, 223 >> n/c - 2h after dinner: n/c - bedtime: n/c - nighttime: n/c No lows. Lowest sugar was 97 >> 124 >> 107 >> 125 >> 114; he has hypoglycemia awareness at 82.  Highest sugar was 295 x1 >> 375 >> 340x1 >> 223 >> 273.  Pt's meals are: - Breakfast: 1 egg + [place] sausage + toast; occasionally 2 eggs - Lunch: sub or meat + veggies - Dinner: salad/vegetables + stake, sometimes starch - usually eats out or [employer] - Snacks: 2x a day: icecream He plays golf, but not lately. He eats out a lot these days.  - + CKD, last BUN/creatinine:  Lab Results  Component Value Date   BUN 28 (H) 10/02/2015   CREATININE 1.63 (H) 10/02/2015  On Benazepril. - last set of lipids: Lab Results  Component Value Date   CHOL 162 02/05/2014   HDL 38 (L) 02/05/2014   LDLCALC 89 02/05/2014   LDLDIRECT 164.1 03/24/2012   TRIG 176 (H) 02/05/2014   CHOLHDL 4.3 02/05/2014  On Crestor 10. - last eye exam was in 11/06/2015. Dr Bing Plume. No DR.  - no numbness and tingling in his feet.  ROS: Constitutional: + weight  gain, no fatigue, no subjective hyperthermia/hypothermia,no  nocturia Eyes: no blurry vision, no xerophthalmia ENT: no sore throat, no nodules palpated in throat, no dysphagia/odynophagia, no hoarseness Cardiovascular: no CP/SOB/palpitations/leg swelling Respiratory: no cough/SOB Gastrointestinal: no N/V/D/C Musculoskeletal: no muscle/joint aches Skin: no rashes Neurological: no tremors/numbness/tingling/dizziness  I reviewed pt's medications, allergies, PMH, social hx, family hx, and changes were documented in the history of present illness. Otherwise, unchanged from my initial visit note:  Past Medical History:  Diagnosis Date  . Abnormal LFTs    LFTs were abnormal with acute MI March, 2013, improving at the time of discharge  . Aortic insufficiency    Moderate, echo, March, 2012  . Arthritis    hands  . Bradycardia    Sinus bradycardia  . CAD (coronary artery disease)   . Diabetes mellitus   . Dizziness     12/2009 & 12/ 2012  . Dyslipidemia   . Ejection fraction    45%, echo, March, 2012,   /  Ejection fraction after his MI March, 2013 is 45%. There is posterior basal akinesis with abnormal septal motion.  . Elevated CPK    2013, now in 2014 patient on very low dose Crestor  . Hard of  hearing   . Hx of CABG   . Hyperlipidemia   . Hypertension   . Hypothyroidism   . Ischemia    .Marland KitchenMarland KitchenEF 42%  . Leg cramps    history of nighttime  . Mitral regurgitation    Moderate, echo, March, 2012  . Myocardial infarction ([place]) 3/19-27/2013   ARF, elevated LFTs  . Nocturia   . Pulmonary hypertension ([place])    Echo, March, 2012, 42 mmHg  . RBBB (right bundle branch block with left anterior fascicular block)    New July, 2010  . Shingles October 2011   Severe pain to right flank treated  . Systolic CHF ([place])   . Wears glasses    Past Surgical History:  Procedure Laterality Date  . BACK SURGERY  1999   LS sx- 1999  . CARDIAC CATHETERIZATION  2013   occluded graft; Dr Lia Foyer  .  CARDIAC SURGERY    . CORONARY ARTERY BYPASS GRAFT  1996   5 vessels  . ENDARTERECTOMY Right 06/12/2015   Procedure: ENDARTERECTOMY CAROTID WITH PATCH ANGIOPLASTY;  Surgeon: Serafina Mitchell, MD;  Location: [place];  Service: Vascular;  Laterality: Right;  . EYE SURGERY Bilateral December 2016   bilateral cataract surgery  . LEFT HEART CATHETERIZATION WITH CORONARY ANGIOGRAM N/A 08/03/2011   Procedure: LEFT HEART CATHETERIZATION WITH CORONARY ANGIOGRAM;  Surgeon: Hillary Bow, MD;  [facility];  Service: Cardiovascular;  Laterality: N/A;  . LEFT HEART CATHETERIZATION WITH CORONARY/GRAFT ANGIOGRAM N/A 02/05/2014   Procedure: LEFT HEART CATHETERIZATION WITH Beatrix Fetters;  Surgeon: Troy Sine, MD;  Location: [facility] CATH LAB;  Service: Cardiovascular;  Laterality: N/A;  . SPINAL FUSION  2000   scar tissue, plate insertion/fusion @ LS spine - 2000   History   Social History  . Marital Status: Married    Spouse Name: N/A  . Children: yes   Occupational History  .  [employer]    Social History Main Topics  . Smoking status: Former Smoker -- 1.00 packs/day for 5 years    Quit date: 05/17/1949  . Smokeless tobacco: Never Used     Comment: Quit at age 68  . Alcohol Use: No  . Drug Use: No   Current Outpatient Prescriptions on File Prior to Visit  Medication Sig Dispense Refill  . acetaminophen (TYLENOL) 325 MG tablet Take 650 mg by mouth every 6 (six) hours as needed for mild pain or moderate pain.     Marland Kitchen amLODipine (NORVASC) 10 MG tablet TAKE 1 TABLET BY MOUTH EVERY DAY 30 tablet 9  . aspirin EC 81 MG tablet Take 81 mg by mouth every morning.    . benazepril (LOTENSIN) 40 MG tablet TAKE 1 TABLET BY MOUTH EVERY DAY 30 tablet 5  . carvedilol (COREG) 3.125 MG tablet TAKE 1 TABLET (3.125 MG TOTAL) BY MOUTH 2 (TWO) TIMES DAILY WITH A MEAL. 60 tablet 5  . cholecalciferol (VITAMIN D) 1000 UNITS tablet Take 1,000 Units by mouth daily.     . clopidogrel (PLAVIX) 75 MG tablet TAKE  1 TABLET BY MOUTH EVERY DAY 30 tablet 5  . dorzolamide (TRUSOPT) 2 % ophthalmic solution Place 1 drop into both eyes daily.     . furosemide (LASIX) 40 MG tablet Take 20 mg by mouth daily as needed for fluid.    . furosemide (LASIX) 40 MG tablet Take 0.5 tablets (20 mg total) by mouth as needed. 45 tablet 1  . glipiZIDE (GLUCOTROL XL) 5 MG 24 hr tablet TAKE  2 TABLET (5 MG TOTAL) BY MOUTH DAILY WITH BREAKFAST. 60 tablet 2  . Insulin Detemir (LEVEMIR FLEXTOUCH) 100 UNIT/ML Pen Inject 45 Units into the skin daily at 10 pm. 15 mL 3  . Insulin Pen Needle (PEN NEEDLES) 32G X 4 MM MISC 1 each by Does not apply route daily. 100 each 11  . isosorbide mononitrate (IMDUR) 30 MG 24 hr tablet TAKE 1 TABLET BY MOUTH EVERY DAY 30 tablet 3  . levothyroxine (SYNTHROID, LEVOTHROID) 50 MCG tablet Take 1 tablet (50 mcg total) by mouth daily before breakfast. 30 tablet 11  . LUMIGAN 0.01 % SOLN Place 1 drop into both eyes at bedtime.   2  . LUMIGAN 0.03 % ophthalmic solution Place into both eyes at bedtime and may repeat dose one time if needed. AS NEEDED FOR DRY EYES    . nitroGLYCERIN (NITROSTAT) 0.4 MG SL tablet Place 0.4 mg under the tongue every 5 (five) minutes as needed for chest pain.    . NONFORMULARY OR COMPOUNDED ITEM Diabetic testing supply Check glucose once daily if possible Fasting or morning glucose recommended M, W, F, & Sun if possible Goal= 100-150 Glucose 2 hours after breakfast Tue, after lunch Th & 2 hrs after eve meal Sat if possible Goal = < 180 1 each 0  . oxyCODONE-acetaminophen (PERCOCET/ROXICET) 5-325 MG tablet Take 1 tablet by mouth every 6 (six) hours as needed for moderate pain. 30 tablet 0  . rosuvastatin (CRESTOR) 10 MG tablet Take 1 tablet (10 mg total) by mouth 2 (two) times a week. Takes on Sundays and Wednesdays 90 tablet 1  . [DISCONTINUED] pravastatin (PRAVACHOL) 20 MG tablet Take 20 mg by mouth daily.       No current facility-administered medications on file prior to visit.     Allergies  Allergen Reactions  . Atorvastatin     REACTION: myalgias  . Folic Acid     REACTION: Face burns  . Nicardipine Hcl     REACTION: Gingival hypertrophy  . Pravastatin     myalgias   Family History  Problem Relation Age of Onset  . Diabetes Sister   . Heart attack Father 34  . Lung cancer Sister   . Stroke Neg Hx   . Diabetes Brother   . Hyperlipidemia Mother   . Hypertension Son    PE: BP 138/62   Pulse (!) 56   Temp 97.7 F (36.5 C) (Oral)   Wt 193 lb (87.5 kg)   SpO2 96%   BMI 28.50 kg/m  Body mass index is 28.5 kg/m. Wt Readings from Last 3 Encounters:  12/08/15 193 lb (87.5 kg)  10/02/15 186 lb (84.4 kg)  06/30/15 191 lb 6.4 oz (86.8 kg)   Constitutional: normal weight, in NAD Eyes: PERRLA, EOMI, no exophthalmos ENT: moist mucous membranes, no thyromegaly, no cervical lymphadenopathy Cardiovascular: RRR,+ SEM 1/6, no RG Respiratory: CTA B Gastrointestinal: abdomen soft, NT, ND, BS+ Musculoskeletal: no deformities, strength intact in all 4 Skin: moist, warm, no rashes Neurological: no tremor with outstretched hands, DTR normal in all 4  ASSESSMENT: 1. DM2, insulin-dependent, uncontrolled, with complications - chronic kidney disease  - CAD, s/p MI, s/p CABG, s/p stent   PLAN:  1. Patient with long-standing, uncontrolled diabetes, on a basal insulin regimen + Glipizide. He could not afford the DPP4 inhibitor. Sugars are ~ same as last time, but he is decided to start losing weight. In this case, we will not change his regimen. - a good HbA1c  target for him would be less than 7.5%, due to age and comorbidities. - I suggested to: Patient Instructions  Please continue: - Levemir 45 units at bedtime. - Glipizide ER 10 mg in am, before breakfast.  Check sugars 1x a day, rotating check times. Check some sugars later in the day.  Please let me know if the sugars are consistently <80 or >200.  Please return in 3 months with your sugar log.   -  continue checking sugars at different times of the day - check 1-2 times a day, rotating checks - advised for yearly eye exams - he is up-to-date   - will check HbA1c today >> 7.9% (slightly better) - Return to clinic in 3 mo with sugar log

## 2015-12-08 NOTE — Patient Instructions (Addendum)
Please continue: - Levemir  45 units at bedtime. - Glipizide ER 10 mg in am, before breakfast.  Check sugars 1x a day, rotating check times. Check some sugars later in the day.  Please let me know if the sugars are consistently <80 or >200.  Please return in 3 months with your sugar log.

## 2015-12-23 ENCOUNTER — Ambulatory Visit: Payer: Medicare Other | Admitting: Cardiology

## 2016-01-13 ENCOUNTER — Encounter: Payer: Self-pay | Admitting: Cardiology

## 2016-01-13 ENCOUNTER — Other Ambulatory Visit: Payer: Self-pay

## 2016-01-13 ENCOUNTER — Ambulatory Visit (INDEPENDENT_AMBULATORY_CARE_PROVIDER_SITE_OTHER): Payer: Medicare Other | Admitting: Cardiology

## 2016-01-13 VITALS — BP 136/72 | HR 69 | Ht 69.0 in | Wt 191.1 lb

## 2016-01-13 DIAGNOSIS — I5022 Chronic systolic (congestive) heart failure: Secondary | ICD-10-CM

## 2016-01-13 DIAGNOSIS — Z951 Presence of aortocoronary bypass graft: Secondary | ICD-10-CM | POA: Diagnosis not present

## 2016-01-13 DIAGNOSIS — I2510 Atherosclerotic heart disease of native coronary artery without angina pectoris: Secondary | ICD-10-CM | POA: Diagnosis not present

## 2016-01-13 DIAGNOSIS — I6521 Occlusion and stenosis of right carotid artery: Secondary | ICD-10-CM | POA: Diagnosis not present

## 2016-01-13 NOTE — Progress Notes (Signed)
Cardiology Office Note    Date:  01/13/2016   ID:  [name], DOB 09/16/1925, MRN [id number]  PCP:  Mauricio Po, FNP  Cardiologist:   Candee Furbish, MD (former Ron Parker)    History of Present Illness:  [name] is a 82 y.o. male who underwent carotid endarterectomy on 06/12/2015, right sided, who later that afternoon developed chest pain with troponin of 0.54 at peak, subsequent a 0.18 and was sent to the cardiac catheterization lab at the request of Dr. Ellyn Hack however the patient was confused and declared that they had the wrong patient and did not one to procedure. Dr. Tamala Julian reviewed the patient's images from previous cardiac catheterization and did not believe there would be anatomy amenable to PCI unless there was LAD disease distal to the LIMA graft. Under this circumstance, the case was canceled and medical management was suggested unless he had recurrent or unstable symptoms.  The next day, he did well without any recurring chest discomfort and confusion had improved. Creatinine was 1.5. He was seen by Dr. Ellyn Hack, Dr. Tamala Julian, Dr. Meda Coffee during the hospital stay. Echocardiogram from 02/06/14 showed EF of 55%, mild aortic and mitral regurgitation. Prior akinesis of the basal inferior segment as well as inferolateral segments. Line status has remained quite stable.  Last cardiac catheterization was 02/05/14 by Dr. Claiborne Billings. Dr. Tamala Julian reviewed these results carefully.  No further confusion. Doing well. No further chest pain. Ambulating well. 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   Past Medical History:  Diagnosis Date  . Abnormal LFTs    LFTs were abnormal with acute MI March, 2013, improving at the time of discharge  . Aortic insufficiency    Moderate, echo, March, 2012  . Arthritis    hands  . Bradycardia    Sinus bradycardia  . CAD (coronary artery disease)   . Diabetes mellitus   . Dizziness     12/2009 & 12/ 2012  . Dyslipidemia   . Ejection fraction    45%, echo, March, 2012,   /  Ejection fraction after his MI March, 2013 is 45%. There is posterior basal akinesis with abnormal septal motion.  . Elevated CPK    2013, now in 2014 patient on very low dose Crestor  . Hard of hearing   . Hx of CABG   . Hyperlipidemia   . Hypertension   . Hypothyroidism   . Ischemia    .Marland KitchenMarland KitchenEF 42%  . Leg cramps    history of nighttime  . Mitral regurgitation    Moderate, echo, March, 2012  . Myocardial infarction ([place]) 3/19-27/2013   ARF, elevated LFTs  . Nocturia   . Pulmonary hypertension ([place])    Echo, March, 2012, 42 mmHg  . RBBB (right bundle branch block with left anterior fascicular block)    New July, 2010  . Shingles October 2011   Severe pain to right flank treated  . Systolic CHF ([place])   . Wears glasses     Past Surgical History:  Procedure Laterality Date  . BACK SURGERY  1999   LS sx- 1999  . CARDIAC CATHETERIZATION  2013   occluded graft; Dr Lia Foyer  . CARDIAC SURGERY    . CORONARY ARTERY BYPASS GRAFT  1996   5 vessels  . ENDARTERECTOMY Right 06/12/2015   Procedure: ENDARTERECTOMY CAROTID WITH PATCH ANGIOPLASTY;  Surgeon: Serafina Mitchell, MD;  Location: [place];  Service: Vascular;  Laterality: Right;  . EYE SURGERY Bilateral December 2016  bilateral cataract surgery  . LEFT HEART CATHETERIZATION WITH CORONARY ANGIOGRAM N/A 08/03/2011   Procedure: LEFT HEART CATHETERIZATION WITH CORONARY ANGIOGRAM;  Surgeon: Hillary Bow, MD;  [facility] C[facility] LAB;  Service: Cardiovascular;  Laterality: N/A;  . LEFT HEART CATHETERIZATION WITH CORONARY/GRAFT ANGIOGRAM N/A 02/05/2014   Procedure: LEFT HEART CATHETERIZATION WITH Beatrix Fetters;  Surgeon: Troy Sine, MD;  Location: [facility] CATH LAB;  Service: Cardiovascular;  Laterality: N/A;  . SPINAL FUSION  2000   scar tissue, plate insertion/fusion @ LS spine - 2000    Outpatient Medications Prior to Visit  Medication Sig Dispense Refill  . acetaminophen (TYLENOL) 325 MG tablet  Take 650 mg by mouth every 6 (six) hours as needed for mild pain or moderate pain.     Marland Kitchen amLODipine (NORVASC) 10 MG tablet TAKE 1 TABLET BY MOUTH EVERY DAY 30 tablet 9  . aspirin EC 81 MG tablet Take 81 mg by mouth every morning.    . benazepril (LOTENSIN) 40 MG tablet TAKE 1 TABLET BY MOUTH EVERY DAY 30 tablet 5  . carvedilol (COREG) 3.125 MG tablet TAKE 1 TABLET (3.125 MG TOTAL) BY MOUTH 2 (TWO) TIMES DAILY WITH A MEAL. 60 tablet 5  . cholecalciferol (VITAMIN D) 1000 UNITS tablet Take 1,000 Units by mouth daily.     . clopidogrel (PLAVIX) 75 MG tablet TAKE 1 TABLET BY MOUTH EVERY DAY 30 tablet 5  . dorzolamide (TRUSOPT) 2 % ophthalmic solution Place 1 drop into both eyes daily.     . furosemide (LASIX) 40 MG tablet Take 20 mg by mouth daily as needed for fluid.    . furosemide (LASIX) 40 MG tablet Take 0.5 tablets (20 mg total) by mouth as needed. 45 tablet 1  . glipiZIDE (GLUCOTROL XL) 5 MG 24 hr tablet TAKE 2 TABLET (5 MG TOTAL) BY MOUTH DAILY WITH BREAKFAST. 60 tablet 2  . Insulin Detemir (LEVEMIR FLEXTOUCH) 100 UNIT/ML Pen Inject 45 Units into the skin daily at 10 pm. 15 mL 3  . Insulin Pen Needle (PEN NEEDLES) 32G X 4 MM MISC 1 each by Does not apply route daily. 100 each 11  . isosorbide mononitrate (IMDUR) 30 MG 24 hr tablet TAKE 1 TABLET BY MOUTH EVERY DAY 30 tablet 3  . levothyroxine (SYNTHROID, LEVOTHROID) 50 MCG tablet Take 1 tablet (50 mcg total) by mouth daily before breakfast. 30 tablet 11  . LUMIGAN 0.03 % ophthalmic solution Place into both eyes at bedtime and may repeat dose one time if needed. AS NEEDED FOR DRY EYES    . nitroGLYCERIN (NITROSTAT) 0.4 MG SL tablet Place 0.4 mg under the tongue every 5 (five) minutes as needed for chest pain.    . NONFORMULARY OR COMPOUNDED ITEM Diabetic testing supply Check glucose once daily if possible Fasting or morning glucose recommended M, W, F, & Sun if possible Goal= 100-150 Glucose 2 hours after breakfast Tue, after lunch Th & 2 hrs  after eve meal Sat if possible Goal = < 180 1 each 0  . oxyCODONE-acetaminophen (PERCOCET/ROXICET) 5-325 MG tablet Take 1 tablet by mouth every 6 (six) hours as needed for moderate pain. 30 tablet 0  . rosuvastatin (CRESTOR) 10 MG tablet Take 1 tablet (10 mg total) by mouth 2 (two) times a week. Takes on Sundays and Wednesdays 90 tablet 1  . LUMIGAN 0.01 % SOLN Place 1 drop into both eyes at bedtime.   2   No facility-administered medications prior to visit.  Allergies:   Atorvastatin; Folic acid; Nicardipine hcl; and Pravastatin   Social History   Social History  . Marital status: Married    Spouse name: N/A  . Number of children: N/A  . Years of education: N/A   Occupational History  . [employer]   Social History Main Topics  . Smoking status: Former Smoker    Packs/day: 1.00    Years: 5.00    Quit date: 05/17/1949  . Smokeless tobacco: Never Used     Comment: Quit at age 86  . Alcohol use No  . Drug use: No  . Sexual activity: Not Asked   Other Topics Concern  . None   Social History Narrative   Pt lives with wife.     Family History:  The patient's family history includes Diabetes in his brother and sister; Heart attack (age of onset: 71) in his father; Hyperlipidemia in his mother; Hypertension in his son; Lung cancer in his sister.   ROS:   Please see the history of present illness.    ROS All other systems reviewed and are negative.   PHYSICAL EXAM:   VS:  BP 136/72   Pulse 69   Ht '5\' 9"'$  (1.753 m)   Wt 191 lb 1.9 oz (86.7 kg)   SpO2 97%   BMI 28.22 kg/m    GEN: Well nourished, well developed, in no acute distress, elderly  HEENT: Right-sided carotid endarterectomy scar well healing  Neck: no JVD, carotid bruits, or masses Cardiac: RRR; no murmurs, rubs, or gallops,no edema  Respiratory:  clear to auscultation bilaterally, normal work of breathing GI: soft, nontender, nondistended, + BS MS: no deformity or atrophy  Skin:  warm and dry, no rash Neuro:  Alert and Oriented x 3, Strength and sensation are intact Psych: euthymic mood, full affect  Wt Readings from Last 3 Encounters:  01/13/16 191 lb 1.9 oz (86.7 kg)  12/08/15 193 lb (87.5 kg)  10/02/15 186 lb (84.4 kg)      Studies/Labs Reviewed:   EKG:  EKG is not ordered today.    Recent Labs: 06/05/2015: ALT 24 06/12/2015: Magnesium 2.0 06/13/2015: Hemoglobin 12.1; Platelets 150 10/02/2015: BUN 28; Creatinine, Ser 1.63; Potassium 4.9; Sodium 142; TSH 3.18   Lipid Panel    Component Value Date/Time   CHOL 162 02/05/2014 0740   TRIG 176 (H) 02/05/2014 0740   HDL 38 (L) 02/05/2014 0740   CHOLHDL 4.3 02/05/2014 0740   VLDL 35 02/05/2014 0740   LDLCALC 89 02/05/2014 0740   LDLDIRECT 164.1 03/24/2012 1353    Additional studies/ records that were reviewed today include:  Hospital records reviewed, lab work reviewed    ASSESSMENT:    1. Coronary artery disease involving native coronary artery of native heart without angina pectoris   2. Hx of CABG   3. Chronic systolic CHF (congestive heart failure) (HCC)      PLAN:  In order of problems listed above:  1. Mildly elevated troponin in the setting of carotid endarterectomy as well as chest pain postoperative day 2. He is no longer having the chest discomfort. He feels great.  Doing well. More in line with demand ischemia given his underlying CAD. Continue with aggressive secondary prevention. 2. Carotid endarterectomy, right sided, Dr. Trula Slade 3. Prior bypass surgery, last cardiac catheterization in 2015 reviewed carefully by Dr. Tamala Julian in the cardiac catheterization lab. Continuing with medical management. There was distal LAD disease, there was occluded vein graft supplying circumflex, there  was occluded vein graft to the diagonal. Medical management. 4. Patient did not have any further chest discomfort on day 2. Had some confusion postoperatively. Continue with aggressive medical management.  Noninvasive approach. 5. One-year follow-up. Doing very well. Enjoys golf. 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     Medication Adjustments/Labs and Tests Ordered: Current medicines are reviewed at length with the patient today.  Concerns regarding medicines are outlined above.  Medication changes, Labs and Tests ordered today are listed in the Patient Instructions below. Patient Instructions  Medication Instructions:  The current medical regimen is effective;  continue present plan and medications.  Follow-Up: Follow up in 1 year with Dr. Marlou Porch.  You will receive a letter in the mail 2 months before you are due.  Please call us when you receive this letter to schedule your follow up appointment.  If you need a refill on your cardiac medications before your next appointment, please call your pharmacy.  Thank you for choosing [facility]!!          Signed, Candee Furbish, MD  01/13/2016 3:12 PM    [address], [place], [place]  [id number] Phone: [phone]; Fax: [phone]

## 2016-01-13 NOTE — Patient Instructions (Signed)

## 2016-01-20 ENCOUNTER — Other Ambulatory Visit: Payer: Self-pay | Admitting: Cardiology

## 2016-01-26 ENCOUNTER — Other Ambulatory Visit: Payer: Self-pay | Admitting: Cardiology

## 2016-01-26 ENCOUNTER — Other Ambulatory Visit: Payer: Self-pay | Admitting: Internal Medicine

## 2016-02-11 ENCOUNTER — Other Ambulatory Visit: Payer: Self-pay | Admitting: Cardiology

## 2016-02-20 ENCOUNTER — Ambulatory Visit (INDEPENDENT_AMBULATORY_CARE_PROVIDER_SITE_OTHER): Payer: Medicare Other

## 2016-02-20 DIAGNOSIS — Z23 Encounter for immunization: Secondary | ICD-10-CM | POA: Diagnosis not present

## 2016-02-26 ENCOUNTER — Encounter: Payer: Self-pay | Admitting: Surgery

## 2016-03-01 ENCOUNTER — Ambulatory Visit (INDEPENDENT_AMBULATORY_CARE_PROVIDER_SITE_OTHER): Payer: Medicare Other | Admitting: Family

## 2016-03-01 ENCOUNTER — Encounter: Payer: Self-pay | Admitting: Family

## 2016-03-01 ENCOUNTER — Ambulatory Visit (HOSPITAL_COMMUNITY)
Admission: RE | Admit: 2016-03-01 | Discharge: 2016-03-01 | Disposition: A | Discharge status: Home / Self Care | Payer: Medicare Other | Source: Ambulatory Visit | Attending: Surgery | Admitting: Surgery

## 2016-03-01 VITALS — BP 130/73 | HR 54 | Temp 97.0°F | Resp 16 | Ht 69.0 in | Wt 190.0 lb

## 2016-03-01 DIAGNOSIS — I6521 Occlusion and stenosis of right carotid artery: Secondary | ICD-10-CM | POA: Insufficient documentation

## 2016-03-01 DIAGNOSIS — I499 Cardiac arrhythmia, unspecified: Secondary | ICD-10-CM | POA: Diagnosis not present

## 2016-03-01 DIAGNOSIS — I6523 Occlusion and stenosis of bilateral carotid arteries: Secondary | ICD-10-CM | POA: Diagnosis not present

## 2016-03-01 DIAGNOSIS — Z9889 Other specified postprocedural states: Secondary | ICD-10-CM | POA: Diagnosis not present

## 2016-03-01 NOTE — Progress Notes (Signed)
Chief Complaint: Follow up Extracranial Carotid Artery Stenosis   History of Present Illness  [name] is a 82 y.o. male patient of Dr. Trula Slade who is s/p right carotid endarterectomy with bovine pericardial patch angioplasty on 06/12/2015. Intraoperative findings included a near occlusive plaque which resembled a ruptured plaque with mobile debris within the artery.  His postoperative course was uncomplicated. He returns today for follow up.  Dr. Trula Slade last evaluated pt on 06/30/15 with complaints of pain in his neck.  He stated that it hurt when he turns his head from side to side.  He is not taking anything but Tylenol.  At that time the right carotid incision was healing appropriately. There was no hematoma.  He was neurologically intact there is no bruit.  When he first gets up in the morning, he has a stiff feeling at the back of his neck, which improves through the day.   He denies chest pain, denies dyspnea, denies feeling light headed.  The patient denies any history of TIA or stroke symptoms, specifically the patient denies a history of amaurosis fugax or monocular blindness, denies a history unilateral  of facial drooping, denies a history of hemiplegia, and denies a history of receptive or expressive aphasia.    Pt Diabetic: yes, A1C was 7.9 in July, 2017 (review of records) Pt smoker: former smoker, quit in 1951, smoked for 5 years   Pt meds include: Statin : yes ASA: yes Other anticoagulants/antiplatelets: Plavix    Past Medical History:  Diagnosis Date  . Abnormal LFTs    LFTs were abnormal with acute MI March, 2013, improving at the time of discharge  . Aortic insufficiency    Moderate, echo, March, 2012  . Arthritis    hands  . Bradycardia    Sinus bradycardia  . CAD (coronary artery disease)   . Diabetes mellitus   . Dizziness     12/2009 & 12/ 2012  . Dyslipidemia   . Ejection fraction    45%, echo, March, 2012,   /  Ejection fraction after  his MI March, 2013 is 45%. There is posterior basal akinesis with abnormal septal motion.  . Elevated CPK    2013, now in 2014 patient on very low dose Crestor  . Hard of hearing   . Hx of CABG   . Hyperlipidemia   . Hypertension   . Hypothyroidism   . Ischemia    .Marland KitchenMarland KitchenEF 42%  . Leg cramps    history of nighttime  . Mitral regurgitation    Moderate, echo, March, 2012  . Myocardial infarction 3/19-27/2013   ARF, elevated LFTs  . Nocturia   . Pulmonary hypertension    Echo, March, 2012, 42 mmHg  . RBBB (right bundle branch block with left anterior fascicular block)    New July, 2010  . Shingles October 2011   Severe pain to right flank treated  . Systolic CHF ([place])   . Wears glasses     Social History Social History  Substance Use Topics  . Smoking status: Former Smoker    Packs/day: 1.00    Years: 5.00    Quit date: 05/17/1949  . Smokeless tobacco: Never Used     Comment: Quit at age 80  . Alcohol use No    Family History Family History  Problem Relation Age of Onset  . Diabetes Sister   . Heart attack Father 20  . Lung cancer Sister   . Stroke Neg Hx   .  Diabetes Brother   . Hyperlipidemia Mother   . Hypertension Son     Surgical History Past Surgical History:  Procedure Laterality Date  . BACK SURGERY  1999   LS sx- 1999  . CARDIAC CATHETERIZATION  2013   occluded graft; Dr Lia Foyer  . CARDIAC SURGERY    . CORONARY ARTERY BYPASS GRAFT  1996   5 vessels  . ENDARTERECTOMY Right 06/12/2015   Procedure: ENDARTERECTOMY CAROTID WITH PATCH ANGIOPLASTY;  Surgeon: Serafina Mitchell, MD;  Location: [facility] [place];  Service: Vascular;  Laterality: Right;  . EYE SURGERY Bilateral December 2016   bilateral cataract surgery  . LEFT HEART CATHETERIZATION WITH CORONARY ANGIOGRAM N/A 08/03/2011   Procedure: LEFT HEART CATHETERIZATION WITH CORONARY ANGIOGRAM;  Surgeon: Hillary Bow, MD;  Location: [facility] C[facility] LAB;  Service: Cardiovascular;  Laterality: N/A;  . LEFT HEART  CATHETERIZATION WITH CORONARY/GRAFT ANGIOGRAM N/A 02/05/2014   Procedure: LEFT HEART CATHETERIZATION WITH Beatrix Fetters;  Surgeon: Troy Sine, MD;  Location: [facility] CATH LAB;  Service: Cardiovascular;  Laterality: N/A;  . SPINAL FUSION  2000   scar tissue, plate insertion/fusion @ [facility] spine - 2000    Allergies  Allergen Reactions  . Atorvastatin     REACTION: myalgias  . Folic Acid     REACTION: Face burns  . Nicardipine Hcl     REACTION: Gingival hypertrophy  . Pravastatin     myalgias    Current Outpatient Prescriptions  Medication Sig Dispense Refill  . acetaminophen (TYLENOL) 325 MG tablet Take 650 mg by mouth every 6 (six) hours as needed for mild pain or moderate pain.     Marland Kitchen amLODipine (NORVASC) 10 MG tablet TAKE 1 TABLET BY MOUTH EVERY DAY 30 tablet 10  . aspirin EC 81 MG tablet Take 81 mg by mouth every morning.    . benazepril (LOTENSIN) 40 MG tablet TAKE 1 TABLET BY MOUTH EVERY DAY 30 tablet 11  . carvedilol (COREG) 3.125 MG tablet TAKE 1 TABLET (3.125 MG TOTAL) BY MOUTH 2 (TWO) TIMES DAILY WITH A MEAL. 60 tablet 5  . cholecalciferol (VITAMIN D) 1000 UNITS tablet Take 1,000 Units by mouth daily.     . clopidogrel (PLAVIX) 75 MG tablet TAKE 1 TABLET BY MOUTH EVERY DAY 30 tablet 11  . dorzolamide (TRUSOPT) 2 % ophthalmic solution Place 1 drop into both eyes daily.     . furosemide (LASIX) 40 MG tablet Take 0.5 tablets (20 mg total) by mouth as needed. 45 tablet 1  . glipiZIDE (GLUCOTROL XL) 5 MG 24 hr tablet TAKE 2 TABLET (5 MG TOTAL) BY MOUTH DAILY WITH BREAKFAST. 60 tablet 2  . Insulin Detemir (LEVEMIR FLEXTOUCH) 100 UNIT/ML Pen Inject 45 Units into the skin daily at 10 pm. 15 mL 3  . Insulin Pen Needle (PEN NEEDLES) 32G X 4 MM MISC 1 each by Does not apply route daily. 100 each 11  . isosorbide mononitrate (IMDUR) 30 MG 24 hr tablet TAKE 1 TABLET BY MOUTH EVERY DAY 30 tablet 3  . levothyroxine (SYNTHROID, LEVOTHROID) 50 MCG tablet Take 1 tablet (50 mcg total) by  mouth daily before breakfast. 30 tablet 11  . LUMIGAN 0.03 % ophthalmic solution Place into both eyes at bedtime and may repeat dose one time if needed. AS NEEDED FOR DRY EYES    . nitroGLYCERIN (NITROSTAT) 0.4 MG SL tablet Place 0.4 mg under the tongue every 5 (five) minutes as needed for chest pain.    . NONFORMULARY OR COMPOUNDED ITEM Diabetic  testing supply Check glucose once daily if possible Fasting or morning glucose recommended M, W, F, & Sun if possible Goal= 100-150 Glucose 2 hours after breakfast Tue, after lunch Th & 2 hrs after eve meal Sat if possible Goal = < 180 1 each 0  . oxyCODONE-acetaminophen (PERCOCET/ROXICET) 5-325 MG tablet Take 1 tablet by mouth every 6 (six) hours as needed for moderate pain. 30 tablet 0  . rosuvastatin (CRESTOR) 10 MG tablet Take 1 tablet (10 mg total) by mouth 2 (two) times a week. Takes on Sundays and Wednesdays 90 tablet 1   No current facility-administered medications for this visit.     Review of Systems : See HPI for pertinent positives and negatives.  Physical Examination  Vitals:   03/01/16 0945 03/01/16 0949  BP: 139/68 130/73  Pulse: 60 (!) 54  Resp:  16  Temp:  97 F (36.1 C)  TempSrc:  Oral  SpO2:  98%  Weight:  190 lb (86.2 kg)  Height:  '5\' 9"'$  (1.753 m)   Body mass index is 28.06 kg/m.  General: WDWN male in NAD GAIT: normal Eyes: PERRLA Pulmonary:  Respirations are non-labored, good air movement, CTAB   Cardiac: Irregular rhythm, bradycardic at 54 (on a beta blocker),  no detected murmur.  VASCULAR EXAM Carotid Bruits Right Left   Negative Negative    Aorta is not palpable. Radial pulses are 2+ palpable and  equal.                                                                                                                            LE Pulses Right Left       POPLITEAL  not palpable   not palpable       POSTERIOR TIBIAL  not palpable   not palpable        DORSALIS PEDIS      ANTERIOR TIBIAL Faintly  palpable  faintly palpable     Gastrointestinal: soft, nontender, BS WNL, no r/g, no palpable masses.  Musculoskeletal: No muscle atrophy/wasting. M/S 5/5 throughout, extremities without ischemic changes.  Neurologic: A&O X 3; Appropriate Affect, Speech is normal CN 2-12 intact except mild left facial droop with smile, pain and light touch intact in extremities, Motor exam as listed above.    Assessment: [name] is a 82 y.o. male who is s/p right carotid endarterectomy with bovine pericardial patch angioplasty on 06/12/2015. He has no known history of stroke or TIA.   His cardiac rhythm is irregular. Review of Dr. Marlou Porch note from pt's 01/13/16 visit indicates his cardiac rhythm was regular.  Pt has no history of atrial fib or arrhythmias.  He denies chest pain, denies dyspnea, denies feeling light headed. I advised pt to call Dr. Marlou Porch office and ask their advice re this. I will send Dr. Marlou Porch my notes from today and communicate with him re this.   DATA Today's carotid duplex suggests a patent right CEA with no  restenosis. Left ICA with 40-59% stenosis. Bilateral vertebral arteries are antegrade (normal). Bilateral subclavian arteries are multiphasic (normal).  This is the first post operative exam.   Plan: Follow-up in 6 months with Carotid Duplex scan.   I discussed in depth with the patient the nature of atherosclerosis, and emphasized the importance of maximal medical management including strict control of blood pressure, blood glucose, and lipid levels, obtaining regular exercise, and continued cessation of smoking.  The patient is aware that without maximal medical management the underlying atherosclerotic disease process will progress, limiting the benefit of any interventions. The patient was given information about stroke prevention and what symptoms should prompt the patient to seek immediate medical care. Thank you for allowing [place] to participate in this  patient's care.  Clemon Chambers, RN, MSN, FNP-C [facility] Office: [facility] Physician: Trula Slade  03/01/16 9:55 AM

## 2016-03-01 NOTE — Patient Instructions (Signed)
Stroke Prevention Some medical conditions and behaviors are associated with an increased chance of having a stroke. You may prevent a stroke by making healthy choices and managing medical conditions. HOW CAN I REDUCE MY RISK OF HAVING A STROKE?   Stay physically active. Get at least 30 minutes of activity on most or all days.  Do not smoke. It may also be helpful to avoid exposure to secondhand smoke.  Limit alcohol use. Moderate alcohol use is considered to be:  No more than 2 drinks per day for men.  No more than 1 drink per day for nonpregnant women.  Eat healthy foods. This involves:  Eating 5 or more servings of fruits and vegetables a day.  Making dietary changes that address high blood pressure (hypertension), high cholesterol, diabetes, or obesity.  Manage your cholesterol levels.  Making food choices that are high in fiber and low in saturated fat, trans fat, and cholesterol may control cholesterol levels.  Take any prescribed medicines to control cholesterol as directed by your health care provider.  Manage your diabetes.  Controlling your carbohydrate and sugar intake is recommended to manage diabetes.  Take any prescribed medicines to control diabetes as directed by your health care provider.  Control your hypertension.  Making food choices that are low in salt (sodium), saturated fat, trans fat, and cholesterol is recommended to manage hypertension.  Ask your health care provider if you need treatment to lower your blood pressure. Take any prescribed medicines to control hypertension as directed by your health care provider.  If you are 18-39 years of age, have your blood pressure checked every 3-5 years. If you are 40 years of age or older, have your blood pressure checked every year.  Maintain a healthy weight.  Reducing calorie intake and making food choices that are low in sodium, saturated fat, trans fat, and cholesterol are recommended to manage  weight.  Stop drug abuse.  Avoid taking birth control pills.  Talk to your health care provider about the risks of taking birth control pills if you are over 35 years old, smoke, get migraines, or have ever had a blood clot.  Get evaluated for sleep disorders (sleep apnea).  Talk to your health care provider about getting a sleep evaluation if you snore a lot or have excessive sleepiness.  Take medicines only as directed by your health care provider.  For some people, aspirin or blood thinners (anticoagulants) are helpful in reducing the risk of forming abnormal blood clots that can lead to stroke. If you have the irregular heart rhythm of atrial fibrillation, you should be on a blood thinner unless there is a good reason you cannot take them.  Understand all your medicine instructions.  Make sure that other conditions (such as anemia or atherosclerosis) are addressed. SEEK IMMEDIATE MEDICAL CARE IF:   You have sudden weakness or numbness of the face, arm, or leg, especially on one side of the body.  Your face or eyelid droops to one side.  You have sudden confusion.  You have trouble speaking (aphasia) or understanding.  You have sudden trouble seeing in one or both eyes.  You have sudden trouble walking.  You have dizziness.  You have a loss of balance or coordination.  You have a sudden, severe headache with no known cause.  You have new chest pain or an irregular heartbeat. Any of these symptoms may represent a serious problem that is an emergency. Do not wait to see if the symptoms will   go away. Get medical help at once. Call your local emergency services (911 in [place] Do not drive yourself to the hospital.   This information is not intended to replace advice given to you by your health care provider. Make sure you discuss any questions you have with your health care provider.   Document Released: 06/10/2004 Document Revised: 05/24/2014 Document Reviewed:  11/03/2012 Elsevier Interactive Patient Education 2016 Elsevier Inc.  

## 2016-03-08 ENCOUNTER — Other Ambulatory Visit: Payer: Self-pay | Admitting: Cardiology

## 2016-03-08 ENCOUNTER — Other Ambulatory Visit: Payer: Self-pay

## 2016-03-08 MED ORDER — CARVEDILOL 3.125 MG PO TABS: ORAL_TABLET | ORAL | 3 refills | Status: DC

## 2016-03-09 ENCOUNTER — Other Ambulatory Visit: Payer: Self-pay | Admitting: *Deleted

## 2016-03-09 ENCOUNTER — Encounter: Payer: Self-pay | Admitting: Internal Medicine

## 2016-03-09 ENCOUNTER — Ambulatory Visit (INDEPENDENT_AMBULATORY_CARE_PROVIDER_SITE_OTHER): Payer: Medicare Other | Admitting: Internal Medicine

## 2016-03-09 ENCOUNTER — Other Ambulatory Visit: Payer: Self-pay

## 2016-03-09 VITALS — BP 110/78 | HR 55 | Ht 68.5 in | Wt 195.0 lb

## 2016-03-09 DIAGNOSIS — E1159 Type 2 diabetes mellitus with other circulatory complications: Secondary | ICD-10-CM

## 2016-03-09 DIAGNOSIS — I6521 Occlusion and stenosis of right carotid artery: Secondary | ICD-10-CM | POA: Diagnosis not present

## 2016-03-09 DIAGNOSIS — E1165 Type 2 diabetes mellitus with hyperglycemia: Secondary | ICD-10-CM

## 2016-03-09 LAB — POCT GLYCOSYLATED HEMOGLOBIN (HGB A1C): Hemoglobin A1C: 8

## 2016-03-09 MED ORDER — GLIPIZIDE ER 5 MG PO TB24: ORAL_TABLET | ORAL | 1 refills | Status: DC

## 2016-03-09 MED ORDER — GLIPIZIDE ER 5 MG PO TB24: ORAL_TABLET | ORAL | 0 refills | Status: DC

## 2016-03-09 MED ORDER — INSULIN DETEMIR 100 UNIT/ML FLEXPEN: 50.0000 [IU] | PEN_INJECTOR | Freq: Every day | SUBCUTANEOUS | 3 refills | Status: DC

## 2016-03-09 NOTE — Progress Notes (Signed)
Patient ID: [name], male   DOB: 1925-09-07, 82 y.o.   MRN: [id number]  HPI: [name] is a 82 y.o.-year-old male, returning for follow-up for DM2, dx in 2008, insulin-dependent since 2015, uncontrolled, with complications (CAD, s/p MI and CABG in 1994, also s/p stent; CKD). Last 3 mo ago.  He will have a CEA later this month.  Last hemoglobin A1c was: Lab Results  Component Value Date   HGBA1C 7.9 12/08/2015   HGBA1C 8.2 (H) 06/12/2015   HGBA1C 7.6 05/29/2015   Pt is on a regimen of: - Levemir 40 >> 45 units at bedtime - Glipizide XL 10 mg daily Could not afford Onglyza 5 mg in a.m  Pt checks his sugars 1x a day in am and they are: - am: 125-219, 233 >> 107-155, 169, 210 >> 140-175, 216, 223 >> 114-168, 186, 273 >> 99, 124-172, 189, 204 (end of Aug.: 246) - 2h after b'fast: n/c - before lunch: n/c - 2h after lunch: n/c >> 309 >> 340 >> 145 >> n/c >> 152 - before dinner: n/c >> 196, 205, 223 >> n/c - 2h after dinner: n/c - bedtime: n/c - nighttime: n/c No lows. Lowest sugar was 114 >> 99; he has hypoglycemia awareness at 80.  Highest sugar was 273 >> 246.  Pt's meals are: - Breakfast: 1 egg + [place] sausage + toast; occasionally 2 eggs - Lunch: sub or meat + veggies - Dinner: salad/vegetables + stake, sometimes starch - usually eats out or [employer] - Snacks: 2x a day: icecream He plays golf, but not lately. He eats out a lot these days.  - + CKD, last BUN/creatinine:  Lab Results  Component Value Date   BUN 28 (H) 10/02/2015   CREATININE 1.63 (H) 10/02/2015  On Benazepril. - last set of lipids: Lab Results  Component Value Date   CHOL 162 02/05/2014   HDL 38 (L) 02/05/2014   LDLCALC 89 02/05/2014   LDLDIRECT 164.1 03/24/2012   TRIG 176 (H) 02/05/2014   CHOLHDL 4.3 02/05/2014  On Crestor 10. - last eye exam was in 11/06/2015. Dr Bing Plume. No DR.  - no numbness and tingling in his feet.  ROS: Constitutional: + weight gain, no fatigue, no subjective  hyperthermia/hypothermia,no  nocturia Eyes: no blurry vision, no xerophthalmia ENT: no sore throat, no nodules palpated in throat, no dysphagia/odynophagia, no hoarseness Cardiovascular: no CP/SOB/palpitations/leg swelling Respiratory: no cough/SOB Gastrointestinal: no N/V/D/C Musculoskeletal: no muscle/joint aches Skin: no rashes Neurological: no tremors/numbness/tingling/dizziness  I reviewed pt's medications, allergies, PMH, social hx, family hx, and changes were documented in the history of present illness. Otherwise, unchanged from my initial visit note:  Past Medical History:  Diagnosis Date  . Abnormal LFTs    LFTs were abnormal with acute MI March, 2013, improving at the time of discharge  . Aortic insufficiency    Moderate, echo, March, 2012  . Arthritis    hands  . Bradycardia    Sinus bradycardia  . CAD (coronary artery disease)   . Diabetes mellitus   . Dizziness     12/2009 & 12/ 2012  . Dyslipidemia   . Ejection fraction    45%, echo, March, 2012,   /  Ejection fraction after his MI March, 2013 is 45%. There is posterior basal akinesis with abnormal septal motion.  . Elevated CPK    2013, now in 2014 patient on very low dose Crestor  . Hard of hearing   . Hx of CABG   .  Hyperlipidemia   . Hypertension   . Hypothyroidism   . Ischemia    .Marland KitchenMarland KitchenEF 42%  . Leg cramps    history of nighttime  . Mitral regurgitation    Moderate, echo, March, 2012  . Myocardial infarction 3/19-27/2013   ARF, elevated LFTs  . Nocturia   . Pulmonary hypertension    Echo, March, 2012, 42 mmHg  . RBBB (right bundle branch block with left anterior fascicular block)    New July, 2010  . Shingles October 2011   Severe pain to right flank treated  . Systolic CHF ([place])   . Wears glasses    Past Surgical History:  Procedure Laterality Date  . BACK SURGERY  1999   LS sx- 1999  . CARDIAC CATHETERIZATION  2013   occluded graft; Dr Lia Foyer  . CARDIAC SURGERY    . CORONARY ARTERY  BYPASS GRAFT  1996   5 vessels  . ENDARTERECTOMY Right 06/12/2015   Procedure: ENDARTERECTOMY CAROTID WITH PATCH ANGIOPLASTY;  Surgeon: Serafina Mitchell, MD;  Location: [place];  Service: Vascular;  Laterality: Right;  . EYE SURGERY Bilateral December 2016   bilateral cataract surgery  . LEFT HEART CATHETERIZATION WITH CORONARY ANGIOGRAM N/A 08/03/2011   Procedure: LEFT HEART CATHETERIZATION WITH CORONARY ANGIOGRAM;  Surgeon: Hillary Bow, MD;  Location: [facility] CATH LAB;  Service: Cardiovascular;  Laterality: N/A;  . LEFT HEART CATHETERIZATION WITH CORONARY/GRAFT ANGIOGRAM N/A 02/05/2014   Procedure: LEFT HEART CATHETERIZATION WITH Beatrix Fetters;  Surgeon: Troy Sine, MD;  [facility] C[facility] LAB;  Service: Cardiovascular;  Laterality: N/A;  . SPINAL FUSION  2000   scar tissue, plate insertion/fusion @ [facility] - 2000   History   Social History  . Marital Status: Married    Spouse Name: N/A  . Children: yes   Occupational History  .  [employer]    Social History Main Topics  . Smoking status: Former Smoker -- 1.00 packs/day for 5 years    Quit date: 05/17/1949  . Smokeless tobacco: Never Used     Comment: Quit at age 74  . Alcohol Use: No  . Drug Use: No   Current Outpatient Prescriptions on File Prior to Visit  Medication Sig Dispense Refill  . acetaminophen (TYLENOL) 325 MG tablet Take 650 mg by mouth every 6 (six) hours as needed for mild pain or moderate pain.     Marland Kitchen amLODipine (NORVASC) 10 MG tablet TAKE 1 TABLET BY MOUTH EVERY DAY 30 tablet 10  . aspirin EC 81 MG tablet Take 81 mg by mouth every morning.    . benazepril (LOTENSIN) 40 MG tablet TAKE 1 TABLET BY MOUTH EVERY DAY 30 tablet 11  . carvedilol (COREG) 3.125 MG tablet TAKE 1 TABLET (3.125 MG TOTAL) BY MOUTH 2 (TWO) TIMES DAILY WITH A MEAL. 60 tablet 3  . carvedilol (COREG) 3.125 MG tablet Take 1 tablet (3.125 mg total) by mouth 2 (two) times daily with a meal. 60 tablet 11  . cholecalciferol (VITAMIN D) 1000  UNITS tablet Take 1,000 Units by mouth daily.     . clopidogrel (PLAVIX) 75 MG tablet TAKE 1 TABLET BY MOUTH EVERY DAY 30 tablet 11  . dorzolamide (TRUSOPT) 2 % ophthalmic solution Place 1 drop into both eyes daily.     . furosemide (LASIX) 40 MG tablet Take 0.5 tablets (20 mg total) by mouth as needed. 45 tablet 1  . glipiZIDE (GLUCOTROL XL) 5 MG 24 hr tablet TAKE 2 TABLET (5 MG TOTAL) BY  MOUTH DAILY WITH BREAKFAST. 60 tablet 2  . Insulin Detemir (LEVEMIR FLEXTOUCH) 100 UNIT/ML Pen Inject 45 Units into the skin daily at 10 pm. 15 mL 3  . Insulin Pen Needle (PEN NEEDLES) 32G X 4 MM MISC 1 each by Does not apply route daily. 100 each 11  . isosorbide mononitrate (IMDUR) 30 MG 24 hr tablet TAKE 1 TABLET BY MOUTH EVERY DAY 30 tablet 3  . levothyroxine (SYNTHROID, LEVOTHROID) 50 MCG tablet Take 1 tablet (50 mcg total) by mouth daily before breakfast. 30 tablet 11  . LUMIGAN 0.03 % ophthalmic solution Place into both eyes at bedtime and may repeat dose one time if needed. AS NEEDED FOR DRY EYES    . nitroGLYCERIN (NITROSTAT) 0.4 MG SL tablet Place 0.4 mg under the tongue every 5 (five) minutes as needed for chest pain.    . NONFORMULARY OR COMPOUNDED ITEM Diabetic testing supply Check glucose once daily if possible Fasting or morning glucose recommended M, W, F, & Sun if possible Goal= 100-150 Glucose 2 hours after breakfast Tue, after lunch Th & 2 hrs after eve meal Sat if possible Goal = < 180 1 each 0  . oxyCODONE-acetaminophen (PERCOCET/ROXICET) 5-325 MG tablet Take 1 tablet by mouth every 6 (six) hours as needed for moderate pain. 30 tablet 0  . rosuvastatin (CRESTOR) 10 MG tablet Take 1 tablet (10 mg total) by mouth 2 (two) times a week. Takes on Sundays and Wednesdays 90 tablet 1  . [DISCONTINUED] pravastatin (PRAVACHOL) 20 MG tablet Take 20 mg by mouth daily.       No current facility-administered medications on file prior to visit.    Allergies  Allergen Reactions  . Atorvastatin      REACTION: myalgias  . Folic Acid     REACTION: Face burns  . Nicardipine Hcl     REACTION: Gingival hypertrophy  . Pravastatin     myalgias   Family History  Problem Relation Age of Onset  . Diabetes Sister   . Heart attack Father 2  . Lung cancer Sister   . Stroke Neg Hx   . Diabetes Brother   . Hyperlipidemia Mother   . Hypertension Son    PE: BP 110/78 (BP Location: Left Arm, Patient Position: Sitting)   Pulse (!) 55   Ht 5' 8.5" (1.74 m)   Wt 195 lb (88.5 kg)   SpO2 97%   BMI 29.22 kg/m  Body mass index is 29.22 kg/m. Wt Readings from Last 3 Encounters:  03/09/16 195 lb (88.5 kg)  03/01/16 190 lb (86.2 kg)  01/13/16 191 lb 1.9 oz (86.7 kg)   Constitutional: normal weight, in NAD Eyes: PERRLA, EOMI, no exophthalmos ENT: moist mucous membranes, no thyromegaly, no cervical lymphadenopathy Cardiovascular: RRR,+ SEM 1/6, no RG Respiratory: CTA B Gastrointestinal: abdomen soft, NT, ND, BS+ Musculoskeletal: no deformities, strength intact in all 4 Skin: moist, warm, no rashes Neurological: no tremor with outstretched hands, DTR normal in all 4  ASSESSMENT: 1. DM2, insulin-dependent, uncontrolled, with complications - chronic kidney disease  - CAD, s/p MI, s/p CABG, s/p stent   PLAN:  1. Patient with long-standing, uncontrolled diabetes, on a basal insulin regimen + Glipizide. He could not afford the DPP4 inhibitor. At last visit, sugars were ~ same, but he was decided to start losing weight (did not lose since last visit), so we did not change his regimen. Sugars are the same now - they were higher in 12/2015. Will increase the insulin a little  but again strongly advised him tpo check sugars at bedtime, too. - a good HbA1c target for him would be less than 7.5%, due to age and comorbidities. - I suggested to: Patient Instructions  Please increase: - Levemir to 50 units at bedtime.  Continue: - Glipizide ER 10 mg in am, before breakfast.  Check some sugars at  bedtime.  Please let me know if the sugars are consistently <80 or >200.  Please return in 3 months with your sugar log.   - continue checking sugars at different times of the day - check 1-2 times a day, rotating checks - advised for yearly eye exams - he is up-to-date   - had flu shot this season - will check HbA1c today >> 8.0%  - Return to clinic in 3 mo with sugar log  Philemon Kingdom, MD PhD [facility] Endocrinology

## 2016-03-09 NOTE — Addendum Note (Signed)
Addended by: Caprice Beaver T on: 03/09/2016 11:44 AM   Modules accepted: Orders

## 2016-03-09 NOTE — Patient Instructions (Addendum)
Please increase: - Levemir to 50 units at bedtime.  Continue: - Glipizide ER 10 mg in am, before breakfast.  Check some sugars at bedtime.  Please let me know if the sugars are consistently <80 or >200.  Please return in 3 months with your sugar log.

## 2016-03-10 ENCOUNTER — Telehealth: Payer: Self-pay | Admitting: Cardiology

## 2016-03-10 ENCOUNTER — Other Ambulatory Visit: Payer: Self-pay | Admitting: *Deleted

## 2016-03-10 MED ORDER — LEVOTHYROXINE SODIUM 50 MCG PO TABS: 50.0000 ug | ORAL_TABLET | Freq: Every day | ORAL | 1 refills | Status: DC

## 2016-03-10 NOTE — Telephone Encounter (Signed)
[facility] requesting a refill on Levothyroxine 50 mcg tablet, taken once daily. Would you like to refill this medication? Please advise

## 2016-03-10 NOTE — Telephone Encounter (Signed)
Pt should obtain from PCP or FNP Mauricio Po.

## 2016-03-10 NOTE — Telephone Encounter (Signed)
Called [facility] and the pt to inform them that we will not be able to refill this medication of Levothyroxine 50 mcg, because it is not a heart medication and that the pt would have to refill this medication with his PCP. The pharmacy and the pt verbalized understanding.

## 2016-03-10 NOTE — Telephone Encounter (Signed)
Requesting refills on husband Levothyroxine. Sent electronically...[name]

## 2016-03-11 ENCOUNTER — Other Ambulatory Visit: Payer: Self-pay | Admitting: Internal Medicine

## 2016-03-17 ENCOUNTER — Other Ambulatory Visit: Payer: Self-pay | Admitting: Internal Medicine

## 2016-03-19 ENCOUNTER — Other Ambulatory Visit: Payer: Self-pay | Admitting: Internal Medicine

## 2016-03-25 ENCOUNTER — Other Ambulatory Visit: Payer: Self-pay | Admitting: Cardiology

## 2016-04-16 ENCOUNTER — Other Ambulatory Visit: Payer: Self-pay | Admitting: Internal Medicine

## 2016-05-20 DIAGNOSIS — H40063 Primary angle closure without glaucoma damage, bilateral: Secondary | ICD-10-CM | POA: Diagnosis not present

## 2016-05-20 DIAGNOSIS — H524 Presbyopia: Secondary | ICD-10-CM | POA: Diagnosis not present

## 2016-05-20 DIAGNOSIS — H26493 Other secondary cataract, bilateral: Secondary | ICD-10-CM | POA: Diagnosis not present

## 2016-05-20 DIAGNOSIS — E119 Type 2 diabetes mellitus without complications: Secondary | ICD-10-CM | POA: Diagnosis not present

## 2016-06-10 ENCOUNTER — Encounter: Payer: Self-pay | Admitting: Internal Medicine

## 2016-06-10 ENCOUNTER — Ambulatory Visit (INDEPENDENT_AMBULATORY_CARE_PROVIDER_SITE_OTHER): Payer: Medicare Other | Admitting: Internal Medicine

## 2016-06-10 VITALS — BP 124/70 | HR 63 | Ht 69.0 in | Wt 190.0 lb

## 2016-06-10 DIAGNOSIS — E1159 Type 2 diabetes mellitus with other circulatory complications: Secondary | ICD-10-CM | POA: Diagnosis not present

## 2016-06-10 DIAGNOSIS — E1165 Type 2 diabetes mellitus with hyperglycemia: Secondary | ICD-10-CM

## 2016-06-10 LAB — LIPID PANEL
CHOL/HDL RATIO: 5
Cholesterol: 145 mg/dL (ref 0–200)
HDL: 32.1 mg/dL — AB (ref >39.00)
LDL Cholesterol: 91 mg/dL (ref 0–99)
NONHDL: 113.39
Triglycerides: 110 mg/dL (ref 0.0–149.0)
VLDL: 22 mg/dL (ref 0.0–40.0)

## 2016-06-10 LAB — POCT GLYCOSYLATED HEMOGLOBIN (HGB A1C): Hemoglobin A1C: 8.2

## 2016-06-10 MED ORDER — INSULIN DETEMIR 100 UNIT/ML FLEXPEN: PEN_INJECTOR | SUBCUTANEOUS | 2 refills | Status: DC

## 2016-06-10 NOTE — Addendum Note (Signed)
Addended by: Caprice Beaver T on: 06/10/2016 04:34 PM   Modules accepted: Orders

## 2016-06-10 NOTE — Patient Instructions (Addendum)
Please continue: - Levemir to 50 units at bedtime. - Glipizide ER 10 mg in am, before breakfast.  Add 2 halves of a 5 mg Glipizide XL tablet before dinner. If sugars after dinner still >200 in a week, try to take 4 halves of Glipizide XL before dinner.  Please return in 1.5 months with your sugar log.

## 2016-06-10 NOTE — Progress Notes (Signed)
Patient ID: [name], male   DOB: 29-Dec-1925, 83 y.o.   MRN: [id number]  HPI: [name] is a 83 y.o.-year-old male, returning for follow-up for DM2, dx in 2008, insulin-dependent since 2015, uncontrolled, with complications (CAD, s/p MI and CABG in 1994, also s/p stent; CKD). Last 3 mo ago. His wife accompanies him and offers all history as pt is mostly nonverbal.  Last hemoglobin A1c was: Lab Results  Component Value Date   HGBA1C 8.0 03/09/2016   HGBA1C 7.9 12/08/2015   HGBA1C 8.2 (H) 06/12/2015   Pt is on a regimen of: - Levemir 40 >> 45 >> 50 units at bedtime - Glipizide XL 10 mg daily Could not afford Onglyza 5 mg in a.m  Pt checks his sugars 1-2x a day in am and they are: - am: 140-175, 216, 223 >> 114-168, 186, 273 >> 99, 124-172, 189, 204 (end of Aug.: 246) >> 87, 103-192, 221, 241 - 2h after b'fast: n/c - before lunch: n/c >> 200 - 2h after lunch: n/c >> 309 >> 340 >> 145 >> n/c >> 152 >> 193 - before dinner: n/c >> 196, 205, 223 >> n/c >> 187-243 - 2h after dinner: n/c >> 392 - bedtime: n/c >> 243-339, 392 - nighttime: n/c No lows. Lowest sugar was 114 >> 99 >> 87; he has hypoglycemia awareness at 80.  Highest sugar was 273 >> 246 >> 392.  Pt's meals are: - Breakfast: 1 egg + [place] sausage + toast; occasionally 2 eggs - Lunch: sub or meat + veggies - Dinner: salad/vegetables + stake, sometimes starch - usually eats out or [employer] - Snacks: 2x a day: icecream He plays golf, but not recently.  - + CKD, last BUN/creatinine:  Lab Results  Component Value Date   BUN 28 (H) 10/02/2015   CREATININE 1.63 (H) 10/02/2015  On Benazepril. - last set of lipids: Lab Results  Component Value Date   CHOL 162 02/05/2014   HDL 38 (L) 02/05/2014   LDLCALC 89 02/05/2014   LDLDIRECT 164.1 03/24/2012   TRIG 176 (H) 02/05/2014   CHOLHDL 4.3 02/05/2014  On Crestor 10 mg 2x a week. - last eye exam was in 11/06/2015. Dr Bing Plume. No DR.  - no numbness and tingling in  his feet.  ROS: Constitutional: no weight gain, no fatigue, no subjective hyperthermia/hypothermia,no  nocturia Eyes: no blurry vision, no xerophthalmia ENT: no sore throat, no nodules palpated in throat, no dysphagia/odynophagia, no hoarseness Cardiovascular: no CP/SOB/palpitations/leg swelling Respiratory: no cough/SOB Gastrointestinal: no N/V/D/C Musculoskeletal: no muscle/joint aches Skin: no rashes Neurological: no tremors/numbness/tingling/dizziness  I reviewed pt's medications, allergies, PMH, social hx, family hx, and changes were documented in the history of present illness. Otherwise, unchanged from my initial visit note:  Past Medical History:  Diagnosis Date  . Abnormal LFTs    LFTs were abnormal with acute MI March, 2013, improving at the time of discharge  . Aortic insufficiency    Moderate, echo, March, 2012  . Arthritis    hands  . Bradycardia    Sinus bradycardia  . CAD (coronary artery disease)   . Diabetes mellitus   . Dizziness     12/2009 & 12/ 2012  . Dyslipidemia   . Ejection fraction    45%, echo, March, 2012,   /  Ejection fraction after his MI March, 2013 is 45%. There is posterior basal akinesis with abnormal septal motion.  . Elevated CPK    2013, now in 2014 patient on very  low dose Crestor  . Hard of hearing   . Hx of CABG   . Hyperlipidemia   . Hypertension   . Hypothyroidism   . Ischemia    .Marland KitchenMarland KitchenEF 42%  . Leg cramps    history of nighttime  . Mitral regurgitation    Moderate, echo, March, 2012  . Myocardial infarction 3/19-27/2013   ARF, elevated LFTs  . Nocturia   . Pulmonary hypertension    Echo, March, 2012, 42 mmHg  . RBBB (right bundle branch block with left anterior fascicular block)    New July, 2010  . Shingles October 2011   Severe pain to right flank treated  . Systolic CHF ([place])   . Wears glasses    Past Surgical History:  Procedure Laterality Date  . BACK SURGERY  1999   LS sx- 1999  . CARDIAC CATHETERIZATION  2013    occluded graft; Dr Lia Foyer  . CARDIAC SURGERY    . CORONARY ARTERY BYPASS GRAFT  1996   5 vessels  . ENDARTERECTOMY Right 06/12/2015   Procedure: ENDARTERECTOMY CAROTID WITH PATCH ANGIOPLASTY;  Surgeon: Serafina Mitchell, MD;  Location: [place];  Service: Vascular;  Laterality: Right;  . EYE SURGERY Bilateral December 2016   bilateral cataract surgery  . LEFT HEART CATHETERIZATION WITH CORONARY ANGIOGRAM N/A 08/03/2011   Procedure: LEFT HEART CATHETERIZATION WITH CORONARY ANGIOGRAM;  Surgeon: Hillary Bow, MD;  Location: [facility] CATH LAB;  Service: Cardiovascular;  Laterality: N/A;  . LEFT HEART CATHETERIZATION WITH CORONARY/GRAFT ANGIOGRAM N/A 02/05/2014   Procedure: LEFT HEART CATHETERIZATION WITH Beatrix Fetters;  Surgeon: Troy Sine, MD;  Location: [facility] CATH LAB;  Service: Cardiovascular;  Laterality: N/A;  . SPINAL FUSION  2000   scar tissue, plate insertion/fusion @ [facility] spine - 2000   History   Social History  . Marital Status: Married    Spouse Name: N/A  . Children: yes   Occupational History  .  [employer]    Social History Main Topics  . Smoking status: Former Smoker -- 1.00 packs/day for 5 years    Quit date: 05/17/1949  . Smokeless tobacco: Never Used     Comment: Quit at age 82  . Alcohol Use: No  . Drug Use: No   Current Outpatient Prescriptions on File Prior to Visit  Medication Sig Dispense Refill  . acetaminophen (TYLENOL) 325 MG tablet Take 650 mg by mouth every 6 (six) hours as needed for mild pain or moderate pain.     Marland Kitchen amLODipine (NORVASC) 10 MG tablet TAKE 1 TABLET BY MOUTH EVERY DAY 30 tablet 10  . aspirin EC 81 MG tablet Take 81 mg by mouth every morning.    . benazepril (LOTENSIN) 40 MG tablet TAKE 1 TABLET BY MOUTH EVERY DAY 30 tablet 11  . carvedilol (COREG) 3.125 MG tablet Take 1 tablet (3.125 mg total) by mouth 2 (two) times daily with a meal. 60 tablet 11  . cholecalciferol (VITAMIN D) 1000 UNITS tablet Take 1,000 Units by mouth daily.     .  clopidogrel (PLAVIX) 75 MG tablet TAKE 1 TABLET BY MOUTH EVERY DAY 30 tablet 11  . dorzolamide (TRUSOPT) 2 % ophthalmic solution Place 1 drop into both eyes daily.     . furosemide (LASIX) 40 MG tablet TAKE 1/2 TABLET BY MOUTH DAILY AS NEEDED 45 tablet 2  . glipiZIDE (GLUCOTROL XL) 5 MG 24 hr tablet TAKE 2 TABLET (5 MG TOTAL) BY MOUTH DAILY WITH BREAKFAST. 180 tablet 1  . Insulin  Pen Needle (PEN NEEDLES) 32G X 4 MM MISC 1 each by Does not apply route daily. 100 each 11  . isosorbide mononitrate (IMDUR) 30 MG 24 hr tablet TAKE 1 TABLET BY MOUTH EVERY DAY 30 tablet 3  . LEVEMIR FLEXTOUCH 100 UNIT/ML Pen INJECT 45 UNITS SUB-Q DAILY AT 10 PM 15 mL 2  . levothyroxine (SYNTHROID, LEVOTHROID) 50 MCG tablet Take 1 tablet (50 mcg total) by mouth daily before breakfast. 90 tablet 1  . LUMIGAN 0.03 % ophthalmic solution Place into both eyes at bedtime and may repeat dose one time if needed. AS NEEDED FOR DRY EYES    . nitroGLYCERIN (NITROSTAT) 0.4 MG SL tablet Place 0.4 mg under the tongue every 5 (five) minutes as needed for chest pain.    . NONFORMULARY OR COMPOUNDED ITEM Diabetic testing supply Check glucose once daily if possible Fasting or morning glucose recommended M, W, F, & Sun if possible Goal= 100-150 Glucose 2 hours after breakfast Tue, after lunch Th & 2 hrs after eve meal Sat if possible Goal = < 180 1 each 0  . oxyCODONE-acetaminophen (PERCOCET/ROXICET) 5-325 MG tablet Take 1 tablet by mouth every 6 (six) hours as needed for moderate pain. 30 tablet 0  . rosuvastatin (CRESTOR) 10 MG tablet Take 1 tablet (10 mg total) by mouth 2 (two) times a week. Takes on Sundays and Wednesdays 90 tablet 1  . [DISCONTINUED] pravastatin (PRAVACHOL) 20 MG tablet Take 20 mg by mouth daily.       No current facility-administered medications on file prior to visit.    Allergies  Allergen Reactions  . Atorvastatin     REACTION: myalgias  . Folic Acid     REACTION: Face burns  . Nicardipine Hcl     REACTION:  Gingival hypertrophy  . Pravastatin     myalgias   Family History  Problem Relation Age of Onset  . Diabetes Sister   . Heart attack Father 110  . Lung cancer Sister   . Stroke Neg Hx   . Diabetes Brother   . Hyperlipidemia Mother   . Hypertension Son    PE: BP 124/70 (BP Location: Left Arm, Patient Position: Sitting)   Pulse 63   Ht '5\' 9"'$  (1.753 m)   Wt 190 lb (86.2 kg)   SpO2 97%   BMI 28.06 kg/m  Body mass index is 28.06 kg/m. Wt Readings from Last 3 Encounters:  06/10/16 190 lb (86.2 kg)  03/09/16 195 lb (88.5 kg)  03/01/16 190 lb (86.2 kg)   Constitutional: normal weight, in NAD Eyes: PERRLA, EOMI, no exophthalmos ENT: moist mucous membranes, no thyromegaly, no cervical lymphadenopathy Cardiovascular: RRR,+ SEM 1/6, no RG Respiratory: CTA B Gastrointestinal: abdomen soft, NT, ND, BS+ Musculoskeletal: no deformities, strength intact in all 4 Skin: moist, warm, no rashes Neurological: no tremor with outstretched hands, DTR normal in all 4  ASSESSMENT: 1. DM2, insulin-dependent, uncontrolled, with complications - chronic kidney disease  - CAD, s/p MI, s/p CABG, s/p stent   2. HL - on Crestor  PLAN:  1. Patient with long-standing, uncontrolled diabetes, on a basal insulin regimen + Glipizide. He could not afford the DPP4 inhibitor. At last visit, we increased the insulin a little and again strongly advised him tpo check sugars at bedtime, too. He started to check sugars later >> they are very high: 200-300s. Will add Glipizide before dinner, but will likely need mealtime insulin at next visit. - a good HbA1c target for him would be less  than 7.5%, due to age and comorbidities. - I suggested to: Patient Instructions  Please continue: - Levemir 50 units at bedtime. - Glipizide ER 10 mg in am, before breakfast.  Add 2 halves of a 5 mg Glipizide XL tablet before dinner. If sugars after dinner still >200 in a week, try to take 4 halves of Glipizide XL before  dinner.  Please return in 1.5 months with your sugar log.  - continue checking sugars at different times of the day - check 1-2 times a day, rotating checks - advised for yearly eye exams - he is up-to-date   - had flu shot this season - will check HbA1c today >> 8.2%  (higher) - Return to clinic in 3 mo with sugar log  2. HL - on Crestor 10 mg 2x a week - repeat Lipids today (ate b'fast 3 hours ago)  Philemon Kingdom, MD PhD [facility] Endocrinology

## 2016-06-11 ENCOUNTER — Telehealth: Payer: Self-pay

## 2016-06-11 NOTE — Telephone Encounter (Signed)
-----   Message from Philemon Kingdom, MD sent at 06/10/2016  5:29 PM EST ----- Almyra Free, can you please call pt: Cholesterol levels are good, except good cholesterol is slightly low.

## 2016-06-11 NOTE — Telephone Encounter (Signed)
Called patient and gave lab results. Patient had no questions or concerns.  

## 2016-06-15 ENCOUNTER — Other Ambulatory Visit: Payer: Self-pay | Admitting: Cardiology

## 2016-06-26 ENCOUNTER — Other Ambulatory Visit: Payer: Self-pay | Admitting: Internal Medicine

## 2016-07-16 NOTE — Progress Notes (Addendum)
Subjective:   [name] is a 83 y.o. male who presents for an Initial Medicare Annual Wellness Visit.  Review of Systems  No ROS.  Medicare Wellness Visit.  Cardiac Risk Factors include: advanced age (>77mn, >>48women);dyslipidemia;diabetes mellitus;male gender;hypertension;family history of premature cardiovascular disease Sleep patterns: no sleep issues, feels rested on waking, gets up 2 times nightly to void and sleeps 8 hours nightly.   Home Safety/Smoke Alarms: Feels safe in home. Smoke alarms in place.   Living environment; residence and Firearm Safety: 2Hope firearms stored safely. Lives with wife Seat Belt Safety/Bike Helmet: Wears seat belt.   Counseling:   Eye Exam- Every 6 months Dr. DBing PlumeDental- every  6 months.   Male:   CCS-  N/A   PSA-  Lab Results  Component Value Date   PSA 1.34 11/22/2006       Objective:    Today's Vitals   07/19/16 1402  BP: 127/70  Pulse: (!) 56  Resp: 18  SpO2: 99%  Weight: 190 lb (86.2 kg)  Height: '5\' 9"'$  (1.753 m)   Body mass index is 28.06 kg/m.  Current Medications (verified) Outpatient Encounter Prescriptions as of 07/19/2016  Medication Sig  . acetaminophen (TYLENOL) 325 MG tablet Take 650 mg by mouth every 6 (six) hours as needed for mild pain or moderate pain.   .Marland KitchenamLODipine (NORVASC) 10 MG tablet TAKE 1 TABLET BY MOUTH EVERY DAY  . aspirin EC 81 MG tablet Take 81 mg by mouth every morning.  . benazepril (LOTENSIN) 40 MG tablet TAKE 1 TABLET BY MOUTH EVERY DAY  . carvedilol (COREG) 3.125 MG tablet Take 1 tablet (3.125 mg total) by mouth 2 (two) times daily with a meal.  . cholecalciferol (VITAMIN D) 1000 UNITS tablet Take 1,000 Units by mouth 2 (two) times daily.   . clopidogrel (PLAVIX) 75 MG tablet TAKE 1 TABLET BY MOUTH EVERY DAY  . dorzolamide (TRUSOPT) 2 % ophthalmic solution Place 1 drop into both eyes daily.   . furosemide (LASIX) 40 MG tablet TAKE 1/2 TABLET BY MOUTH DAILY AS NEEDED  .  glipiZIDE (GLUCOTROL XL) 5 MG 24 hr tablet TAKE 2 TABLET (5 MG TOTAL) BY MOUTH DAILY WITH BREAKFAST.  .Marland KitchenInsulin Detemir (LEVEMIR FLEXTOUCH) 100 UNIT/ML Pen INJECT 50 UNITS SUB-Q DAILY AT 10 PM  . Insulin Pen Needle (PEN NEEDLES) 32G X 4 MM MISC 1 each by Does not apply route daily.  . isosorbide mononitrate (IMDUR) 30 MG 24 hr tablet TAKE 1 TABLET BY MOUTH EVERY DAY  . levothyroxine (SYNTHROID, LEVOTHROID) 50 MCG tablet Take 1 tablet (50 mcg total) by mouth daily before breakfast.  . LUMIGAN 0.03 % ophthalmic solution Place into both eyes at bedtime and may repeat dose one time if needed. AS NEEDED FOR DRY EYES  . nitroGLYCERIN (NITROSTAT) 0.4 MG SL tablet Place 0.4 mg under the tongue every 5 (five) minutes as needed for chest pain.  . NONFORMULARY OR COMPOUNDED ITEM Diabetic testing supply Check glucose once daily if possible Fasting or morning glucose recommended M, W, F, & Sun if possible Goal= 100-150 Glucose 2 hours after breakfast Tue, after lunch Th & 2 hrs after eve meal Sat if possible Goal = < 180  . oxyCODONE-acetaminophen (PERCOCET/ROXICET) 5-325 MG tablet Take 1 tablet by mouth every 6 (six) hours as needed for moderate pain.  . rosuvastatin (CRESTOR) 10 MG tablet Take 1 tablet (10 mg total) by mouth 2 (two) times a week. Takes on Sundays and  Wednesdays  . [DISCONTINUED] glipiZIDE (GLUCOTROL XL) 5 MG 24 hr tablet TAKE 2 TABLET (5 MG TOTAL) BY MOUTH DAILY WITH BREAKFAST.  . [DISCONTINUED] isosorbide mononitrate (IMDUR) 30 MG 24 hr tablet TAKE 1 TABLET BY MOUTH EVERY DAY   No facility-administered encounter medications on file as of 07/19/2016.     Allergies (verified) Atorvastatin; Folic acid; Nicardipine hcl; and Pravastatin   History: Past Medical History:  Diagnosis Date  . Abnormal LFTs    LFTs were abnormal with acute MI March, 2013, improving at the time of discharge  . Aortic insufficiency    Moderate, echo, March, 2012  . Arthritis    hands  . Bradycardia    Sinus  bradycardia  . CAD (coronary artery disease)   . Diabetes mellitus   . Dizziness     12/2009 & 12/ 2012  . Dyslipidemia   . Ejection fraction    45%, echo, March, 2012,   /  Ejection fraction after his MI March, 2013 is 45%. There is posterior basal akinesis with abnormal septal motion.  . Elevated CPK    2013, now in 2014 patient on very low dose Crestor  . Hard of hearing   . Hx of CABG   . Hyperlipidemia   . Hypertension   . Hypothyroidism   . Ischemia    .Marland KitchenMarland KitchenEF 42%  . Leg cramps    history of nighttime  . Mitral regurgitation    Moderate, echo, March, 2012  . Myocardial infarction 3/19-27/2013   ARF, elevated LFTs  . Nocturia   . Pulmonary hypertension    Echo, March, 2012, 42 mmHg  . RBBB (right bundle branch block with left anterior fascicular block)    New July, 2010  . Shingles October 2011   Severe pain to right flank treated  . Systolic CHF ([place])   . Wears glasses    Past Surgical History:  Procedure Laterality Date  . BACK SURGERY  1999   LS sx- 1999  . CARDIAC CATHETERIZATION  2013   occluded graft; Dr Lia Foyer  . CARDIAC SURGERY    . CORONARY ARTERY BYPASS GRAFT  1996   5 vessels  . ENDARTERECTOMY Right 06/12/2015   Procedure: ENDARTERECTOMY CAROTID WITH PATCH ANGIOPLASTY;  Surgeon: Serafina Mitchell, MD;  Location: [place];  Service: Vascular;  Laterality: Right;  . EYE SURGERY Bilateral December 2016   bilateral cataract surgery  . LEFT HEART CATHETERIZATION WITH CORONARY ANGIOGRAM N/A 08/03/2011   Procedure: LEFT HEART CATHETERIZATION WITH CORONARY ANGIOGRAM;  Surgeon: Hillary Bow, MD;  Location[facility] CATH LAB;  Service: Cardiovascular;  Laterality: N/A;  . LEFT HEART CATHETERIZATION WITH CORONARY/GRAFT ANGIOGRAM N/A 02/05/2014   Procedure: LEFT HEART CATHETERIZATION WITH Beatrix Fetters;  Surgeon: Troy Sine, MD;  Location[facility] C[facility] LAB;  Service: Cardiovascular;  Laterality: N/A;  . SPINAL FUSION  2000   scar tissue, plate insertion/fusion @  [facility] spine - 2000   Family History  Problem Relation Age of Onset  . Heart attack Father 26  . Hyperlipidemia Mother   . Diabetes Sister   . Lung cancer Sister   . Diabetes Brother   . Hypertension Son   . Stroke Neg Hx    Social History   Occupational History  . [employer]   Social History Main Topics  . Smoking status: Former Smoker    Packs/day: 1.00    Years: 5.00    Quit date: 05/17/1949  . Smokeless tobacco: Never Used  Comment: Quit at age 47  . Alcohol use No  . Drug use: No  . Sexual activity: No   Tobacco Counseling Counseling given: Not Answered   Activities of Daily Living In your present state of health, do you have any difficulty performing the following activities: 07/19/2016  Hearing? N  Vision? N  Difficulty concentrating or making decisions? N  Walking or climbing stairs? N  Dressing or bathing? N  Doing errands, shopping? N  Preparing Food and eating ? N  Using the Toilet? N  In the past six months, have you accidently leaked urine? N  Do you have problems with loss of bowel control? N  Managing your Medications? N  Managing your Finances? N  Housekeeping or managing your Housekeeping? N  Some recent data might be hidden    Immunizations and Health Maintenance Immunization History  Administered Date(s) Administered  . Influenza, High Dose Seasonal PF 02/12/2014, 02/20/2016  . Tdap 08/15/2014   Health Maintenance Due  Topic Date Due  . PNA vac Low Risk Adult (1 of 2 - PCV13) 04/21/1993  . FOOT EXAM  03/01/2015    Patient Care Team: Golden Circle, FNP as PCP - General (Family Medicine) Jerline Pain, MD as Consulting Physician (Cardiology) Philemon Kingdom, MD as Consulting Physician (Internal Medicine)  Indicate any recent Medical Services you may have received from other than [facility] providers in the past year (date may be approximate).    Assessment:   This is a routine wellness examination for Micco.  Physical assessment deferred to PCP.   Hearing/Vision screen Hearing Screening Comments: Hearing aides, goes to audiologist every 6 months Vision Screening Comments: Wears glasses, glaucoma.  Dietary issues and exercise activities discussed: Current Exercise Habits: Structured exercise class, Type of exercise: walking (plays golf), Time (Minutes): 30, Frequency (Times/Week): 3, Weekly Exercise (Minutes/Week): 90, Intensity: Moderate Diet (meal preparation, eat out, water intake, caffeinated beverages, dairy products, fruits and vegetables): in general, a "healthy" diet  , well balanced, diabetic, low fat/ cholesterol, low salt  Drinks 3 cups of water per day.  Encouraged patient to increase water intake. Discussed being diligent with low carbohydrates and low sugar diet would help to lower 8.2 A1c.  Goals    None     Depression Screen PHQ 2/9 Scores 07/19/2016 08/19/2014 09/07/2011  PHQ - 2 Score 0 0 0    Fall Risk Fall Risk  07/19/2016 01/13/2016 08/19/2014  Falls in the past year? No No Yes  Number falls in past yr: - - 1  Injury with Fall? - - Yes    Cognitive Function:        Screening Tests Health Maintenance  Topic Date Due  . PNA vac Low Risk Adult (1 of 2 - PCV13) 04/21/1993  . FOOT EXAM  03/01/2015  . OPHTHALMOLOGY EXAM  11/05/2016  . HEMOGLOBIN A1C  12/08/2016  . TETANUS/TDAP  08/14/2024  . INFLUENZA VACCINE  Completed        Plan:     Continue to eat heart healthy diet (full of fruits, vegetables, whole grains, lean protein, water--limit salt, fat, and sugar intake) and increase physical activity as tolerated.  Continue doing brain stimulating activities (puzzles, reading, adult coloring books, staying active) to keep memory sharp.   During the course of the visit Tauheed was educated and counseled about the following appropriate screening and preventive services:   Vaccines to include Pneumoccal, Influenza, Hepatitis B, Td, Zostavax, HCV  Colorectal cancer  screening  Cardiovascular disease screening  Diabetes screening  Glaucoma screening  Nutrition counseling  Prostate cancer screening  Patient Instructions (the written plan) were given to the patient.   Michiel Cowboy, RN   07/19/2016     Medical screening examination/treatment/procedure(s) were performed by non-physician practitioner and as supervising provider I was immediately available for consultation/collaboration. I agree with treatment plan. Mauricio Po, FNP

## 2016-07-16 NOTE — Progress Notes (Signed)
Pre visit review using our clinic review tool, if applicable. No additional management support is needed unless otherwise documented below in the visit note. 

## 2016-07-19 ENCOUNTER — Ambulatory Visit (INDEPENDENT_AMBULATORY_CARE_PROVIDER_SITE_OTHER): Payer: Medicare Other | Admitting: *Deleted

## 2016-07-19 VITALS — BP 127/70 | HR 56 | Resp 18 | Ht 69.0 in | Wt 190.0 lb

## 2016-07-19 DIAGNOSIS — Z0000 Encounter for general adult medical examination without abnormal findings: Secondary | ICD-10-CM | POA: Diagnosis not present

## 2016-07-19 NOTE — Patient Instructions (Addendum)
Continue to eat heart healthy diet (full of fruits, vegetables, whole grains, lean protein, water--limit salt, fat, and sugar intake) and increase physical activity as tolerated.  Continue doing brain stimulating activities (puzzles, reading, adult coloring books, staying active) to keep memory sharp.   [name] , Thank you for taking time to come for your Medicare Wellness Visit. I appreciate your ongoing commitment to your health goals. Please review the following plan we discussed and let me know if I can assist you in the future.   These are the goals we discussed: Goals    . 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       This is a list of the screening recommended for you and due dates:  Health Maintenance  Topic Date Due  . Pneumonia vaccines (1 of 2 - PCV13) 02/14/2017*  . Complete foot exam   07/19/2017*  . Eye exam for diabetics  11/05/2016  . Hemoglobin A1C  12/08/2016  . Tetanus Vaccine  08/14/2024  . Flu Shot  Completed  *Topic was postponed. The date shown is not the original due date.

## 2016-07-21 ENCOUNTER — Other Ambulatory Visit: Payer: Self-pay | Admitting: Internal Medicine

## 2016-08-02 ENCOUNTER — Encounter: Payer: Self-pay | Admitting: Internal Medicine

## 2016-08-02 ENCOUNTER — Ambulatory Visit (INDEPENDENT_AMBULATORY_CARE_PROVIDER_SITE_OTHER): Payer: Medicare Other | Admitting: Internal Medicine

## 2016-08-02 VITALS — BP 122/72 | HR 70 | Wt 191.0 lb

## 2016-08-02 DIAGNOSIS — E1159 Type 2 diabetes mellitus with other circulatory complications: Secondary | ICD-10-CM

## 2016-08-02 DIAGNOSIS — E1165 Type 2 diabetes mellitus with hyperglycemia: Secondary | ICD-10-CM

## 2016-08-02 MED ORDER — INSULIN ASPART PROT & ASPART (70-30 MIX) 100 UNIT/ML PEN: 30.0000 [IU] | PEN_INJECTOR | Freq: Two times a day (BID) | SUBCUTANEOUS | 11 refills | Status: DC

## 2016-08-02 MED ORDER — PEN NEEDLES 32G X 4 MM MISC: 1.0000 | Freq: Two times a day (BID) | 11 refills | Status: DC

## 2016-08-02 NOTE — Patient Instructions (Signed)
Please stop Levemir and start 70/30 insulin: - 30 units before b'fast - 30 units before dinner  Please return in 1.5 months with your sugar log.

## 2016-08-02 NOTE — Progress Notes (Signed)
Patient ID: [name], male   DOB: [birth year], 83 y.o.   MRN: [id number]  HPI: [name] is a 83 y.o.-year-old male, returning for follow-up for DM2, dx in 2008, insulin-dependent since 2015, uncontrolled, with complications (CAD, s/p MI and CABG in 1994, also s/p stent; CKD). Last 3 mo ago. His wife accompanies him and offers all history as pt is mostly nonverbal.  Last hemoglobin A1c was: Lab Results  Component Value Date   HGBA1C 8.2 06/10/2016   HGBA1C 8.0 03/09/2016   HGBA1C 7.9 12/08/2015   Pt is on a regimen of: - Levemir 40 >> 45 >> 50 units at bedtime - Glipizide XL 10 mg daily and 7.5-10 mg (halved) before dinner) Could not afford Onglyza 5 mg in a.m  Pt checks his sugars 1-2x a day in am and they are: - am:  99, 124-172, 189, 204 (end of Aug.: 246) >> 87, 103-192, 221, 241 >> 87, 101-196 - 2h after b'fast: n/c >> 222 - before lunch: n/c >> 200 >> 192-262 - 2h after lunch: n/c >> 309 >> 340 >> 145 >> n/c >> 152 >> 193 >> 230 - before dinner: n/c >> 196, 205, 223 >> n/c >> 187-243 >> 177-333 - 2h after dinner: n/c >> 392 >> n/c - bedtime: n/c >> 243-339, 392 >> 292-301 - nighttime: n/c No lows. Lowest sugar was 114 >> 99 >> 87 >> 87; he has hypoglycemia awareness at 80.  Highest sugar was 273 >> 246 >> 392 >> 333.  Pt's meals are: - Breakfast: 1 egg + [place] sausage + toast; occasionally 2 eggs - Lunch: sub or meat + veggies - Dinner: salad/vegetables + stake, sometimes starch - usually eats out or [employer] - Snacks: 2x a day: icecream He plays golf, but not recently.  - + CKD, last BUN/creatinine:  Lab Results  Component Value Date   BUN 28 (H) 10/02/2015   CREATININE 1.63 (H) 10/02/2015  On Benazepril. - last set of lipids: Lab Results  Component Value Date   CHOL 145 06/10/2016   HDL 32.10 (L) 06/10/2016   LDLCALC 91 06/10/2016   LDLDIRECT 164.1 03/24/2012   TRIG 110.0 06/10/2016   CHOLHDL 5 06/10/2016  On Crestor 10 mg 2x a week. - last eye  exam was in 11/06/2015. Dr Bing Plume. No DR.  - no numbness and tingling in his feet.  ROS: Constitutional: no weight gain, no fatigue, no subjective hyperthermia/hypothermia,no  nocturia Eyes: no blurry vision, no xerophthalmia ENT: no sore throat, no nodules palpated in throat, no dysphagia/odynophagia, no hoarseness Cardiovascular: no CP/SOB/palpitations/leg swelling Respiratory: no cough/SOB Gastrointestinal: no N/V/D/C Musculoskeletal: no muscle/joint aches Skin: no rashes Neurological: no tremors/numbness/tingling/dizziness  I reviewed pt's medications, allergies, PMH, social hx, family hx, and changes were documented in the history of present illness. Otherwise, unchanged from my initial visit note:  Past Medical History:  Diagnosis Date  . Abnormal LFTs    LFTs were abnormal with acute MI March, 2013, improving at the time of discharge  . Aortic insufficiency    Moderate, echo, March, 2012  . Arthritis    hands  . Bradycardia    Sinus bradycardia  . CAD (coronary artery disease)   . Diabetes mellitus   . Dizziness     12/2009 & 12/ 2012  . Dyslipidemia   . Ejection fraction    45%, echo, March, 2012,   /  Ejection fraction after his MI March, 2013 is 45%. There is posterior basal akinesis with abnormal  septal motion.  . Elevated CPK    2013, now in 2014 patient on very low dose Crestor  . Hard of hearing   . Hx of CABG   . Hyperlipidemia   . Hypertension   . Hypothyroidism   . Ischemia    .Marland KitchenMarland KitchenEF 42%  . Leg cramps    history of nighttime  . Mitral regurgitation    Moderate, echo, March, 2012  . Myocardial infarction 3/19-27/2013   ARF, elevated LFTs  . Nocturia   . Pulmonary hypertension    Echo, March, 2012, 42 mmHg  . RBBB (right bundle branch block with left anterior fascicular block)    New July, 2010  . Shingles October 2011   Severe pain to right flank treated  . Systolic CHF ([place])   . Wears glasses    Past Surgical History:  Procedure Laterality Date   . BACK SURGERY  1999   [facility] sx- 1999  . CARDIAC CATHETERIZATION  2013   occluded graft; Dr Lia Foyer  . CARDIAC SURGERY    . CORONARY ARTERY BYPASS GRAFT  1996   5 vessels  . ENDARTERECTOMY Right 06/12/2015   Procedure: ENDARTERECTOMY CAROTID WITH PATCH ANGIOPLASTY;  Surgeon: Serafina Mitchell, MD;  Location: [facility];  Service: Vascular;  Laterality: Right;  . EYE SURGERY Bilateral December 2016   bilateral cataract surgery  . LEFT HEART CATHETERIZATION WITH CORONARY ANGIOGRAM N/A 08/03/2011   Procedure: LEFT HEART CATHETERIZATION WITH CORONARY ANGIOGRAM;  Surgeon: Hillary Bow, MD;  Location: [facility] CATH LAB;  Service: Cardiovascular;  Laterality: N/A;  . LEFT HEART CATHETERIZATION WITH CORONARY/GRAFT ANGIOGRAM N/A 02/05/2014   Procedure: LEFT HEART CATHETERIZATION WITH Beatrix Fetters;  Surgeon: Troy Sine, MD;  Location: [facility] CATH LAB;  Service: Cardiovascular;  Laterality: N/A;  . SPINAL FUSION  2000   scar tissue, plate insertion/fusion @ [facility] spine - 2000   History   Social History  . Marital Status: Married    Spouse Name: N/A  . Children: yes   Occupational History  .  [employer]    Social History Main Topics  . Smoking status: Former Smoker -- 1.00 packs/day for 5 years    Quit date: 05/17/1949  . Smokeless tobacco: Never Used     Comment: Quit at age 48  . Alcohol Use: No  . Drug Use: No   Current Outpatient Prescriptions on File Prior to Visit  Medication Sig Dispense Refill  . acetaminophen (TYLENOL) 325 MG tablet Take 650 mg by mouth every 6 (six) hours as needed for mild pain or moderate pain.     Marland Kitchen amLODipine (NORVASC) 10 MG tablet TAKE 1 TABLET BY MOUTH EVERY DAY 30 tablet 10  . aspirin EC 81 MG tablet Take 81 mg by mouth every morning.    . benazepril (LOTENSIN) 40 MG tablet TAKE 1 TABLET BY MOUTH EVERY DAY 30 tablet 11  . carvedilol (COREG) 3.125 MG tablet Take 1 tablet (3.125 mg total) by mouth 2 (two) times daily with a meal. 60 tablet 11  . cholecalciferol  (VITAMIN D) 1000 UNITS tablet Take 1,000 Units by mouth 2 (two) times daily.     . clopidogrel (PLAVIX) 75 MG tablet TAKE 1 TABLET BY MOUTH EVERY DAY 30 tablet 11  . dorzolamide (TRUSOPT) 2 % ophthalmic solution Place 1 drop into both eyes daily.     . furosemide (LASIX) 40 MG tablet TAKE 1/2 TABLET BY MOUTH DAILY AS NEEDED 45 tablet 2  . glipiZIDE (GLUCOTROL XL) 5 MG 24  hr tablet TAKE 2 TABLET (5 MG TOTAL) BY MOUTH DAILY WITH BREAKFAST. 180 tablet 1  . Insulin Detemir (LEVEMIR FLEXTOUCH) 100 UNIT/ML Pen INJECT 50 UNITS SUB-Q DAILY AT 10 PM 15 mL 2  . Insulin Pen Needle (PEN NEEDLES) 32G X 4 MM MISC 1 each by Does not apply route daily. 100 each 11  . isosorbide mononitrate (IMDUR) 30 MG 24 hr tablet TAKE 1 TABLET BY MOUTH EVERY DAY 30 tablet 3  . LEVEMIR FLEXTOUCH 100 UNIT/ML Pen INJECT SUB Q 45 UNITS DAILY AT 10PM 15 pen 2  . levothyroxine (SYNTHROID, LEVOTHROID) 50 MCG tablet Take 1 tablet (50 mcg total) by mouth daily before breakfast. 90 tablet 1  . LUMIGAN 0.03 % ophthalmic solution Place into both eyes at bedtime and may repeat dose one time if needed. AS NEEDED FOR DRY EYES    . nitroGLYCERIN (NITROSTAT) 0.4 MG SL tablet Place 0.4 mg under the tongue every 5 (five) minutes as needed for chest pain.    . NONFORMULARY OR COMPOUNDED ITEM Diabetic testing supply Check glucose once daily if possible Fasting or morning glucose recommended M, W, F, & Sun if possible Goal= 100-150 Glucose 2 hours after breakfast Tue, after lunch Th & 2 hrs after eve meal Sat if possible Goal = < 180 1 each 0  . oxyCODONE-acetaminophen (PERCOCET/ROXICET) 5-325 MG tablet Take 1 tablet by mouth every 6 (six) hours as needed for moderate pain. 30 tablet 0  . rosuvastatin (CRESTOR) 10 MG tablet Take 1 tablet (10 mg total) by mouth 2 (two) times a week. Takes on Sundays and Wednesdays 90 tablet 1  . [DISCONTINUED] pravastatin (PRAVACHOL) 20 MG tablet Take 20 mg by mouth daily.       No current facility-administered  medications on file prior to visit.    Allergies  Allergen Reactions  . Atorvastatin     REACTION: myalgias  . Folic Acid     REACTION: Face burns  . Nicardipine Hcl     REACTION: Gingival hypertrophy  . Pravastatin     myalgias   Family History  Problem Relation Age of Onset  . Heart attack Father 71  . Hyperlipidemia Mother   . Diabetes Sister   . Lung cancer Sister   . Diabetes Brother   . Hypertension Son   . Stroke Neg Hx    PE: BP 122/72 (BP Location: Left Arm, Patient Position: Sitting)   Pulse 70   Wt 191 lb (86.6 kg)   SpO2 95%   BMI 28.21 kg/m  Body mass index is 28.21 kg/m. Wt Readings from Last 3 Encounters:  08/02/16 191 lb (86.6 kg)  07/19/16 190 lb (86.2 kg)  06/10/16 190 lb (86.2 kg)   Constitutional: normal weight, in NAD Eyes: PERRLA, EOMI, no exophthalmos ENT: moist mucous membranes, no thyromegaly, no cervical lymphadenopathy Cardiovascular: RRR,+ SEM 1/6, no RG Respiratory: CTA B Gastrointestinal: abdomen soft, NT, ND, BS+ Musculoskeletal: no deformities, strength intact in all 4 Skin: moist, warm, no rashes Neurological: no tremor with outstretched hands, DTR normal in all 4  ASSESSMENT: 1. DM2, insulin-dependent, uncontrolled, with complications - chronic kidney disease  - CAD, s/p MI, s/p CABG, s/p stent   2. HL - on Crestor  PLAN:  1. Patient with long-standing, uncontrolled diabetes, on a basal insulin regimen + Glipizide. He could not afford the DPP4 inhibitor. His sugars at this visit are still high despite adding Glipizide before dinner at last visit. He does not appear to respond to Glipizide >>  will need mealtime insulin >> switch to 70/30 Novolog. They have a coupon for NovoLog. Will also stop Glipizide for now. - a good HbA1c target for him would be less than 7.5%, due to age and comorbidities. Last A1c was higher, at 8.2%. - I suggested to: Patient Instructions  Please stop Levemir and start 70/30 insulin: - 30 units before  b'fast - 30 units before dinner  Please return in 1.5 months with your sugar log.  - continue checking sugars at different times of the day - check 1-2 times a day, rotating checks - advised for yearly eye exams - he is up-to-date   - had flu shot this season - Return to clinic in 3 mo with sugar log  2. HL - on Crestor 10 mg 2x a week - reviewed Lipids from last visit >> MUCH improved - continue same dose of Crestor  Philemon Kingdom, MD PhD [facility] End[facility]

## 2016-08-12 ENCOUNTER — Telehealth: Payer: Self-pay | Admitting: Internal Medicine

## 2016-08-12 NOTE — Telephone Encounter (Signed)
I contacted the patient and advised of message via voicemail. Requested a call back to report blood sugars on Monday and if he had any further questions.

## 2016-08-12 NOTE — Telephone Encounter (Signed)
This increase the 70/30 insulin dose to 35 units twice a day before breakfast and dinner and may need to go even a little higher over the weekend. Please let me know about the sugars on Monday.

## 2016-08-12 NOTE — Telephone Encounter (Signed)
See message and please advise, Thanks!  

## 2016-08-12 NOTE — Telephone Encounter (Signed)
Pt's wife called in and said that ever since the medication change the other day, his sugar hasn't gone under 200.  She would like to know what to do. She said if you cannot reach her at home to call her on her cell # [phone]

## 2016-08-17 ENCOUNTER — Telehealth: Payer: Self-pay | Admitting: Internal Medicine

## 2016-08-17 NOTE — Telephone Encounter (Deleted)
Patient wife stated patient b/s has been over 200 since he has been on insulin  insulin aspart protamine - aspart (NOVOLOG MIX 70/30 FLEXPEN) (70-30) 100 UNIT/ML FlexPen 15 mL   Please advise

## 2016-08-18 NOTE — Telephone Encounter (Signed)
This message has no content. Is there something I needed to help this patient with? Thanks!

## 2016-08-18 NOTE — Telephone Encounter (Deleted)
No, disregard

## 2016-08-18 NOTE — Telephone Encounter (Deleted)
Patient no showed today's appt. Please advise on how to follow up. °A. No follow up necessary. °B. Follow up urgent. Contact patient immediately. °C. Follow up necessary. Contact patient and schedule visit in ___ days. °D. Follow up advised. Contact patient and schedule visit in ____weeks. ° °

## 2016-08-19 ENCOUNTER — Encounter: Payer: Self-pay | Admitting: Surgery

## 2016-08-24 ENCOUNTER — Other Ambulatory Visit: Payer: Self-pay

## 2016-08-24 ENCOUNTER — Telehealth: Payer: Self-pay

## 2016-08-24 MED ORDER — INSULIN ASPART PROT & ASPART (70-30 MIX) 100 UNIT/ML PEN: 30.0000 [IU] | PEN_INJECTOR | Freq: Two times a day (BID) | SUBCUTANEOUS | 11 refills | Status: DC

## 2016-08-24 MED ORDER — GLIPIZIDE ER 10 MG PO TB24: ORAL_TABLET | ORAL | 1 refills | Status: DC

## 2016-08-24 NOTE — Telephone Encounter (Signed)
Called and notified patient.

## 2016-08-24 NOTE — Telephone Encounter (Signed)
Patient called regarding insulin issues. She states her husbands blood sugar has been running in the 200's with the new insulin, and she states he needs a refill and wants to know if any changes need to be made.   4/10- 282 am 4/9- 248 am 4/8- 235 am 4/7- 165 am, 141 dinner 4/6- 186 am 4/5- 144 am   Please advise. Thank you!

## 2016-08-24 NOTE — Telephone Encounter (Signed)
Please ask them to add back: Glipizide XL 10 mg daily and 10 mg (halved) before dinner) Let me know about the sugars in few days.

## 2016-08-24 NOTE — Telephone Encounter (Signed)
Submitted RX's to pharmacy, attempted to contact patient, no answer. Will attempt to contact later.

## 2016-08-27 ENCOUNTER — Other Ambulatory Visit: Payer: Self-pay | Admitting: Surgery

## 2016-08-27 DIAGNOSIS — I6523 Occlusion and stenosis of bilateral carotid arteries: Secondary | ICD-10-CM

## 2016-08-30 ENCOUNTER — Ambulatory Visit (INDEPENDENT_AMBULATORY_CARE_PROVIDER_SITE_OTHER): Payer: Medicare Other | Admitting: Surgery

## 2016-08-30 ENCOUNTER — Encounter: Payer: Self-pay | Admitting: Surgery

## 2016-08-30 ENCOUNTER — Ambulatory Visit (HOSPITAL_COMMUNITY)
Admission: RE | Admit: 2016-08-30 | Discharge: 2016-08-30 | Disposition: A | Discharge status: Home / Self Care | Payer: Medicare Other | Source: Ambulatory Visit | Attending: Surgery | Admitting: Surgery

## 2016-08-30 VITALS — BP 119/67 | HR 56 | Temp 97.8°F | Resp 16 | Ht 69.0 in | Wt 193.0 lb

## 2016-08-30 DIAGNOSIS — I6523 Occlusion and stenosis of bilateral carotid arteries: Secondary | ICD-10-CM | POA: Insufficient documentation

## 2016-08-30 LAB — VAS US CAROTID
LEFT ECA DIAS: 0 cm/s
LEFT VERTEBRAL DIAS: 12 cm/s
Left CCA dist dias: 12 cm/s
Left CCA dist sys: 62 cm/s
Left CCA prox dias: 10 cm/s
Left CCA prox sys: 66 cm/s
RIGHT CCA MID DIAS: 10 cm/s
RIGHT ECA DIAS: -1 cm/s
RIGHT VERTEBRAL DIAS: 9 cm/s
Right CCA prox dias: 6 cm/s
Right CCA prox sys: 71 cm/s
Right cca dist sys: -63 cm/s

## 2016-08-30 NOTE — Progress Notes (Signed)
[facility]  Patient name: [name] MRN: [id number] DOB: 07/26/1925 Sex: male   REASON FOR VISIT:    f/u carotid  HISOTRY OF PRESENT ILLNESS:   The patient is back for his first postoperative follow-up.  On 06/12/2015, he underwent right carotid endarterectomy with bovine pericardial patch angioplasty.  Intraoperative findings included a near occlusive plaque which resembled a ruptured plaque with mobile debris within the artery.  His postoperative course was uncomplicated.  He denies any complaints of neurologic symptoms today.  Specifically denies numbness or weakness in either extremity.  He denies slurred speech.  He denies amaurosis fugax.  He does not have any symptoms of claudication.  PAST MEDICAL HISTORY:   Past Medical History:  Diagnosis Date  . Abnormal LFTs    LFTs were abnormal with acute MI March, 2013, improving at the time of discharge  . Aortic insufficiency    Moderate, echo, March, 2012  . Arthritis    hands  . Bradycardia    Sinus bradycardia  . CAD (coronary artery disease)   . Diabetes mellitus   . Dizziness     12/2009 & 12/ 2012  . Dyslipidemia   . Ejection fraction    45%, echo, March, 2012,   /  Ejection fraction after his MI March, 2013 is 45%. There is posterior basal akinesis with abnormal septal motion.  . Elevated CPK    2013, now in 2014 patient on very low dose Crestor  . Hard of hearing   . Hx of CABG   . Hyperlipidemia   . Hypertension   . Hypothyroidism   . Ischemia    .Marland KitchenMarland KitchenEF 42%  . Leg cramps    history of nighttime  . Mitral regurgitation    Moderate, echo, March, 2012  . Myocardial infarction ([place]) 3/19-27/2013   ARF, elevated LFTs  . Nocturia   . Pulmonary hypertension ([place])    Echo, March, 2012, 42 mmHg  . RBBB (right bundle branch block with left anterior fascicular block)    New July, 2010  . Shingles October 2011   Severe pain to right flank  treated  . Systolic CHF ([place])   . Wears glasses      FAMILY HISTORY:   Family History  Problem Relation Age of Onset  . Heart attack Father 79  . Hyperlipidemia Mother   . Diabetes Sister   . Lung cancer Sister   . Diabetes Brother   . Hypertension Son   . Stroke Neg Hx     SOCIAL HISTORY:   Social History  Substance Use Topics  . Smoking status: Former Smoker    Packs/day: 1.00    Years: 5.00    Quit date: 05/17/1949  . Smokeless tobacco: Never Used     Comment: Quit at age 31  . Alcohol use No     ALLERGIES:   Allergies  Allergen Reactions  . Atorvastatin     REACTION: myalgias  . Folic Acid     REACTION: Face burns  . Nicardipine Hcl     REACTION: Gingival hypertrophy  . Pravastatin     myalgias     CURRENT MEDICATIONS:   Current Outpatient Prescriptions  Medication Sig Dispense Refill  . acetaminophen (TYLENOL) 325 MG tablet Take 650 mg by mouth every 6 (six) hours as needed for mild pain or moderate pain.     Marland Kitchen amLODipine (NORVASC) 10 MG tablet TAKE 1 TABLET BY MOUTH EVERY DAY 30 tablet 10  .  aspirin EC 81 MG tablet Take 81 mg by mouth every morning.    . benazepril (LOTENSIN) 40 MG tablet TAKE 1 TABLET BY MOUTH EVERY DAY 30 tablet 11  . carvedilol (COREG) 3.125 MG tablet Take 1 tablet (3.125 mg total) by mouth 2 (two) times daily with a meal. 60 tablet 11  . cholecalciferol (VITAMIN D) 1000 UNITS tablet Take 1,000 Units by mouth 2 (two) times daily.     . clopidogrel (PLAVIX) 75 MG tablet TAKE 1 TABLET BY MOUTH EVERY DAY 30 tablet 11  . dorzolamide (TRUSOPT) 2 % ophthalmic solution Place 1 drop into both eyes daily.     . furosemide (LASIX) 40 MG tablet TAKE 1/2 TABLET BY MOUTH DAILY AS NEEDED 45 tablet 2  . glipiZIDE (GLIPIZIDE XL) 10 MG 24 hr tablet Take one tablet daily, and half before dinner. 90 tablet 1  . insulin aspart protamine - aspart (NOVOLOG MIX 70/30 FLEXPEN) (70-30) 100 UNIT/ML FlexPen Inject 0.3 mLs (30 Units total) into the skin 2  (two) times daily. 15 mL 11  . Insulin Pen Needle (PEN NEEDLES) 32G X 4 MM MISC 1 each by Does not apply route 2 (two) times daily. 100 each 11  . isosorbide mononitrate (IMDUR) 30 MG 24 hr tablet TAKE 1 TABLET BY MOUTH EVERY DAY 30 tablet 3  . levothyroxine (SYNTHROID, LEVOTHROID) 50 MCG tablet Take 1 tablet (50 mcg total) by mouth daily before breakfast. 90 tablet 1  . LUMIGAN 0.03 % ophthalmic solution Place into both eyes at bedtime and may repeat dose one time if needed. AS NEEDED FOR DRY EYES    . nitroGLYCERIN (NITROSTAT) 0.4 MG SL tablet Place 0.4 mg under the tongue every 5 (five) minutes as needed for chest pain.    . NONFORMULARY OR COMPOUNDED ITEM Diabetic testing supply Check glucose once daily if possible Fasting or morning glucose recommended M, W, F, & Sun if possible Goal= 100-150 Glucose 2 hours after breakfast Tue, after lunch Th & 2 hrs after eve meal Sat if possible Goal = < 180 1 each 0  . rosuvastatin (CRESTOR) 10 MG tablet Take 1 tablet (10 mg total) by mouth 2 (two) times a week. Takes on Sundays and Wednesdays 90 tablet 1   No current facility-administered medications for this visit.     REVIEW OF SYSTEMS:   '[X]'$  denotes positive finding, '[ ]'$  denotes negative finding Cardiac  Comments:  Chest pain or chest pressure:    Shortness of breath upon exertion:    Short of breath when lying flat:    Irregular heart rhythm:        Vascular    Pain in calf, thigh, or hip brought on by ambulation:    Pain in feet at night that wakes you up from your sleep:     Blood clot in your veins:    Leg swelling:         Pulmonary    Oxygen at home:    Productive cough:     Wheezing:         Neurologic    Sudden weakness in arms or legs:     Sudden numbness in arms or legs:     Sudden onset of difficulty speaking or slurred speech:    Temporary loss of vision in one eye:     Problems with dizziness:         Gastrointestinal    Blood in stool:     Vomited blood:  Genitourinary    Burning when urinating:     Blood in urine:        Psychiatric    Major depression:         Hematologic    Bleeding problems:    Problems with blood clotting too easily:        Skin    Rashes or ulcers:        Constitutional    Fever or chills:      PHYSICAL EXAM:   Vitals:   08/30/16 1144 08/30/16 1148  BP: 123/68 119/67  Pulse: (!) 56   Resp: 16   Temp: 97.8 F (36.6 C)   TempSrc: Oral   SpO2: 98%   Weight: 193 lb (87.5 kg)   Height: '5\' 9"'$  (1.753 m)     GENERAL: The patient is a well-nourished male, in no acute distress. The vital signs are documented above. CARDIAC: There is a regular rate and rhythm.  VASCULAR: No carotid bruits.PalpablePedal pulses. PULMONARY: Non-labored respirations ABDOMEN: Soft and non-tender with normal pitched bowel sounds.  MUSCULOSKELETAL: There are no major deformities or cyanosis. NEUROLOGIC: No focal weakness or paresthesias are detected. SKIN: There are no ulcers or rashes noted. PSYCHIATRIC: The patient has a normal affect.  STUDIES:   I have reviewed his carotid duplex which shows a patent right carotid endarterectomy site without evidence of restenosis.  There is 40-59% left carotid stenosis  MEDICAL ISSUES:   Status post right carotid endarterectomy: The patient is doing very well this time.  He will follow up in 1 year with repeat Doppler studies.    Annamarie Major, MD [facility] [phone] Pager [phone]

## 2016-08-31 NOTE — Addendum Note (Signed)
Addended by: Lianne Cure A on: 08/31/2016 09:18 AM   Modules accepted: Orders

## 2016-09-10 ENCOUNTER — Other Ambulatory Visit: Payer: Self-pay | Admitting: Internal Medicine

## 2016-09-16 ENCOUNTER — Other Ambulatory Visit: Payer: Self-pay

## 2016-09-23 ENCOUNTER — Other Ambulatory Visit: Payer: Self-pay | Admitting: Family

## 2016-10-08 ENCOUNTER — Encounter: Payer: Self-pay | Admitting: Internal Medicine

## 2016-10-08 ENCOUNTER — Ambulatory Visit (INDEPENDENT_AMBULATORY_CARE_PROVIDER_SITE_OTHER): Payer: Medicare Other | Admitting: Internal Medicine

## 2016-10-08 VITALS — BP 132/70 | HR 65 | Ht 68.0 in | Wt 191.0 lb

## 2016-10-08 DIAGNOSIS — E1165 Type 2 diabetes mellitus with hyperglycemia: Secondary | ICD-10-CM | POA: Diagnosis not present

## 2016-10-08 DIAGNOSIS — I6523 Occlusion and stenosis of bilateral carotid arteries: Secondary | ICD-10-CM

## 2016-10-08 DIAGNOSIS — E1159 Type 2 diabetes mellitus with other circulatory complications: Secondary | ICD-10-CM | POA: Diagnosis not present

## 2016-10-08 LAB — POCT GLYCOSYLATED HEMOGLOBIN (HGB A1C): Hemoglobin A1C: 7

## 2016-10-08 MED ORDER — INSULIN ISOPHANE & REGULAR (HUMAN 70-30)100 UNIT/ML KWIKPEN: 35.0000 [IU] | PEN_INJECTOR | Freq: Two times a day (BID) | SUBCUTANEOUS | 11 refills | Status: DC

## 2016-10-08 MED ORDER — INSULIN NPH ISOPHANE & REGULAR (70-30) 100 UNIT/ML ~~LOC~~ SUSP: 35.0000 [IU] | Freq: Two times a day (BID) | SUBCUTANEOUS | 11 refills | Status: AC

## 2016-10-08 NOTE — Patient Instructions (Addendum)
Please continue: - 70/30 insulin 35 units 30 min before meals (change to N/R insulin)  Please continue: - Glipizide XL 10 mg daily and 5 mg before dinner  Please return in 3 months with your sugar log.

## 2016-10-08 NOTE — Addendum Note (Signed)
Addended by: Caprice Beaver T on: 10/08/2016 03:34 PM   Modules accepted: Orders

## 2016-10-08 NOTE — Progress Notes (Addendum)
Patient ID: [name], male   DOB: May 31, 1925, 83 y.o.   MRN: [id number]  HPI: [name] is a 83 y.o.-year-old male, returning for follow-up for DM2, dx in 2008, insulin-dependent since 2015, uncontrolled, with complications (CAD, s/p MI and CABG in 1994, also s/p stent; CKD). Last 3 mo ago. His wife accompanies him today and offers most of the history as he is mostly nonverbal.  Last hemoglobin A1c was: Lab Results  Component Value Date   HGBA1C 8.2 06/10/2016   HGBA1C 8.0 03/09/2016   HGBA1C 7.9 12/08/2015   Pt was on a regimen of: - Levemir 40 >> 45 >> 50 units at bedtime - Glipizide XL 10 mg daily and 7.5-10 mg (halved) before dinner) Could not afford Onglyza 5 mg in a.m  At and after last OV, we changed to: - 70/30 insulin: - 35 units before b'fast - 35 units at bedtime! - Glipizide XL 10 mg daily and 5 mg before dinner  Pt checks his sugars 1-2x a day: - am: 87, 103-192, 221, 241 >> 87, 101-196 >> 97-175, 307 - 2h after b'fast: n/c >> 222 >> n/c - before lunch: n/c >> 200 >> 192-262 >> n/c - 2h after lunch:340 >> 145 >> n/c >> 152 >> 193 >> 230 >> n/c - before dinner: 196, 205, 223 >> n/c >> 187-243 >> 177-333 >> 124-194 - 2h after dinner: n/c >> 392 >> n/c >> 170-207 - bedtime: n/c >> 243-339, 392 >> 292-301 >> 186 - nighttime: n/c >> 269, 341 No lows. Lowest sugar was 114 >> 99 >> 87 >> 87 >> 97; he has hypoglycemia awareness at 80.  Highest sugar was 273 >> 246 >> 392 >> 333 >> 341.  Pt's meals are: - Breakfast: 1 egg + [place] sausage + toast; occasionally 2 eggs - Lunch: sub or meat + veggies - Dinner: salad/vegetables + stake, sometimes starch - usually eats out or [employer] - Snacks: 2x a day: icecream He plays golf.  - He has CKD, last BUN/creatinine:  Lab Results  Component Value Date   BUN 28 (H) 10/02/2015   CREATININE 1.63 (H) 10/02/2015  On Benazepril. - last set of lipids: Lab Results  Component Value Date   CHOL 145 06/10/2016   HDL  32.10 (L) 06/10/2016   LDLCALC 91 06/10/2016   LDLDIRECT 164.1 03/24/2012   TRIG 110.0 06/10/2016   CHOLHDL 5 06/10/2016  On Crestor 10 mg 2x a week. - last eye exam was in 10/2015. Dr. Bing Plume. No DR. - denies numbness and tingling in his feet.  ROS: Constitutional: no weight gain/no weight loss, no fatigue, no subjective hyperthermia, no subjective hypothermia Eyes: no blurry vision, no xerophthalmia ENT: no sore throat, no nodules palpated in throat, no dysphagia, no odynophagia, no hoarseness Cardiovascular: no CP/no SOB/no palpitations/no leg swelling Respiratory: no cough/no SOB/no wheezing Gastrointestinal: no N/no V/no D/no C/no acid reflux Musculoskeletal: no muscle aches/no joint aches Skin: no rashes, no hair loss Neurological: no tremors/no numbness/no tingling/no dizziness  I reviewed pt's medications, allergies, PMH, social hx, family hx, and changes were documented in the history of present illness. Otherwise, unchanged from my initial visit note.  Past Medical History:  Diagnosis Date  . Abnormal LFTs    LFTs were abnormal with acute MI March, 2013, improving at the time of discharge  . Aortic insufficiency    Moderate, echo, March, 2012  . Arthritis    hands  . Bradycardia    Sinus bradycardia  .  CAD (coronary artery disease)   . Diabetes mellitus   . Dizziness     12/2009 & 12/ 2012  . Dyslipidemia   . Ejection fraction    45%, echo, March, 2012,   /  Ejection fraction after his MI March, 2013 is 45%. There is posterior basal akinesis with abnormal septal motion.  . Elevated CPK    2013, now in 2014 patient on very low dose Crestor  . Hard of hearing   . Hx of CABG   . Hyperlipidemia   . Hypertension   . Hypothyroidism   . Ischemia    .Marland KitchenMarland KitchenEF 42%  . Leg cramps    history of nighttime  . Mitral regurgitation    Moderate, echo, March, 2012  . Myocardial infarction ([place]) 3/19-27/2013   ARF, elevated LFTs  . Nocturia   . Pulmonary hypertension ([place])     Echo, March, 2012, 42 mmHg  . RBBB (right bundle branch block with left anterior fascicular block)    New July, 2010  . Shingles October 2011   Severe pain to right flank treated  . Systolic CHF ([place])   . Wears glasses    Past Surgical History:  Procedure Laterality Date  . BACK SURGERY  1999   LS sx- 1999  . CARDIAC CATHETERIZATION  2013   occluded graft; Dr Lia Foyer  . CARDIAC SURGERY    . CORONARY ARTERY BYPASS GRAFT  1996   5 vessels  . ENDARTERECTOMY Right 06/12/2015   Procedure: ENDARTERECTOMY CAROTID WITH PATCH ANGIOPLASTY;  Surgeon: Serafina Mitchell, MD;  Location: [place];  Service: Vascular;  Laterality: Right;  . EYE SURGERY Bilateral December 2016   bilateral cataract surgery  . LEFT HEART CATHETERIZATION WITH CORONARY ANGIOGRAM N/A 08/03/2011   Procedure: LEFT HEART CATHETERIZATION WITH CORONARY ANGIOGRAM;  Surgeon: Hillary Bow, MD;  Location: [facility] CATH LAB;  Service: Cardiovascular;  Laterality: N/A;  . LEFT HEART CATHETERIZATION WITH CORONARY/GRAFT ANGIOGRAM N/A 02/05/2014   Procedure: LEFT HEART CATHETERIZATION WITH Beatrix Fetters;  Surgeon: Troy Sine, MD;  Location: [facility] CATH LAB;  Service: Cardiovascular;  Laterality: N/A;  . SPINAL FUSION  2000   scar tissue, plate insertion/fusion @ LS spine - 2000   History   Social History  . Marital Status: Married    Spouse Name: N/A  . Children: yes   Occupational History  .  [employer]    Social History Main Topics  . Smoking status: Former Smoker -- 1.00 packs/day for 5 years    Quit date: 05/17/1949  . Smokeless tobacco: Never Used     Comment: Quit at age 46  . Alcohol Use: No  . Drug Use: No   Current Outpatient Prescriptions on File Prior to Visit  Medication Sig Dispense Refill  . acetaminophen (TYLENOL) 325 MG tablet Take 650 mg by mouth every 6 (six) hours as needed for mild pain or moderate pain.     Marland Kitchen amLODipine (NORVASC) 10 MG tablet TAKE 1 TABLET BY MOUTH EVERY DAY 30 tablet 10  . aspirin EC  81 MG tablet Take 81 mg by mouth every morning.    . BD PEN NEEDLE NANO U/F 32G X 4 MM MISC USE DAILY AS DIRECTED 100 each 1  . benazepril (LOTENSIN) 40 MG tablet TAKE 1 TABLET BY MOUTH EVERY DAY 30 tablet 11  . carvedilol (COREG) 3.125 MG tablet Take 1 tablet (3.125 mg total) by mouth 2 (two) times daily with a meal. 60 tablet 11  . cholecalciferol (VITAMIN  D) 1000 UNITS tablet Take 1,000 Units by mouth 2 (two) times daily.     . clopidogrel (PLAVIX) 75 MG tablet TAKE 1 TABLET BY MOUTH EVERY DAY 30 tablet 11  . dorzolamide (TRUSOPT) 2 % ophthalmic solution Place 1 drop into both eyes daily.     . furosemide (LASIX) 40 MG tablet TAKE 1/2 TABLET BY MOUTH DAILY AS NEEDED 45 tablet 2  . glipiZIDE (GLIPIZIDE XL) 10 MG 24 hr tablet Take one tablet daily, and half before dinner. 90 tablet 1  . insulin aspart protamine - aspart (NOVOLOG MIX 70/30 FLEXPEN) (70-30) 100 UNIT/ML FlexPen Inject 0.3 mLs (30 Units total) into the skin 2 (two) times daily. 15 mL 11  . Insulin Pen Needle (PEN NEEDLES) 32G X 4 MM MISC 1 each by Does not apply route 2 (two) times daily. 100 each 11  . isosorbide mononitrate (IMDUR) 30 MG 24 hr tablet TAKE 1 TABLET BY MOUTH EVERY DAY 30 tablet 3  . levothyroxine (SYNTHROID, LEVOTHROID) 50 MCG tablet TAKE ONE TABLET BY MOUTH ONCE DAILY BEFORE BREAKFAST 90 tablet 1  . LUMIGAN 0.03 % ophthalmic solution Place into both eyes at bedtime and may repeat dose one time if needed. AS NEEDED FOR DRY EYES    . nitroGLYCERIN (NITROSTAT) 0.4 MG SL tablet Place 0.4 mg under the tongue every 5 (five) minutes as needed for chest pain.    . NONFORMULARY OR COMPOUNDED ITEM Diabetic testing supply Check glucose once daily if possible Fasting or morning glucose recommended M, W, F, & Sun if possible Goal= 100-150 Glucose 2 hours after breakfast Tue, after lunch Th & 2 hrs after eve meal Sat if possible Goal = < 180 1 each 0  . rosuvastatin (CRESTOR) 10 MG tablet Take 1 tablet (10 mg total) by mouth 2  (two) times a week. Takes on Sundays and Wednesdays 90 tablet 1  . [DISCONTINUED] pravastatin (PRAVACHOL) 20 MG tablet Take 20 mg by mouth daily.       No current facility-administered medications on file prior to visit.    Allergies  Allergen Reactions  . Atorvastatin     REACTION: myalgias  . Folic Acid     REACTION: Face burns  . Nicardipine Hcl     REACTION: Gingival hypertrophy  . Pravastatin     myalgias   Family History  Problem Relation Age of Onset  . Heart attack Father 32  . Hyperlipidemia Mother   . Diabetes Sister   . Lung cancer Sister   . Diabetes Brother   . Hypertension Son   . Stroke Neg Hx    PE: BP 132/70 (BP Location: Left Arm, Patient Position: Sitting)   Pulse 65   Ht 5\' 8"  (1.727 m)   Wt 191 lb (86.6 kg)   SpO2 96%   BMI 29.04 kg/m  Body mass index is 29.04 kg/m. Wt Readings from Last 3 Encounters:  10/08/16 191 lb (86.6 kg)  08/30/16 193 lb (87.5 kg)  08/02/16 191 lb (86.6 kg)   Constitutional: overweight, in NAD Eyes: PERRLA, EOMI, no exophthalmos ENT: moist mucous membranes, no thyromegaly, no cervical lymphadenopathy Cardiovascular: RRR, +1/6 SEM, no RG Respiratory: CTA B Gastrointestinal: abdomen soft, NT, ND, BS+ Musculoskeletal: no deformities, strength intact in all 4 Skin: moist, warm, no rashes Neurological: no tremor with outstretched hands, DTR normal in all 4  ASSESSMENT: 1. DM2, insulin-dependent, uncontrolled, with complications - chronic kidney disease  - CAD, s/p MI, s/p CABG, s/p stent   2. HL -  on Crestor  PLAN:  1. Patient with long-standing, uncontrolled diabetes, on a premixed insulin regimen + Glipizide. Sugars are much better after changing to the premixed insulin at last visit. However, after dinner, he still has high CBGs as they tells me he is taking the insulin at bedtime rather than before dinner! I strongly advised him to move this before dinner.  - will try to change to N/R 70/30 as the Novolog 70/30 is  expensive. I advised them ito inject 30 min before meals - I hope we can stop Glipizide soon, but we tried >> sugars were higher after switching to 70/30 insulin. - I suggested to: Patient Instructions  Please continue: - 70/30 insulin 35 units 30 min before meals (change to N/R insulin)  Please continue: - Glipizide XL 10 mg daily and 5 mg before dinner  Please return in 3 months with your sugar log.   - today, HbA1c is 7% (Much better!) - continue checking sugars at different times of the day - check 2x a day, rotating checks - advised for yearly eye exams >> he is UTD - will check a BMP - Return to clinic in 3 mo with sugar log   2. HL - on Crestor 10 mg 2x a week - reviewed latest lipids >> at goal  Component     Latest Ref Rng & Units 10/08/2016          Sodium     135 - 146 mmol/L 140  Potassium     3.5 - 5.3 mmol/L 5.1  Chloride     98 - 110 mmol/L 105  CO2     20 - 31 mmol/L 23  Glucose     65 - 99 mg/dL 132 (H)  BUN     7 - 25 mg/dL 24  Creatinine     0.70 - 1.11 mg/dL 1.66 (H)  Calcium     8.6 - 10.3 mg/dL 9.3  GFR, Est African American     >=60 mL/min 42 (L)  GFR, Est Non African American     >=60 mL/min 36 (L)  Hemoglobin A1C      7.0   Kidney fxn stable.  Philemon Kingdom, MD PhD [facility] Endocrinology

## 2016-10-09 LAB — BASIC METABOLIC PANEL WITH GFR
BUN: 24 mg/dL (ref 7–25)
CO2: 23 mmol/L (ref 20–31)
Calcium: 9.3 mg/dL (ref 8.6–10.3)
Chloride: 105 mmol/L (ref 98–110)
Creat: 1.66 mg/dL — ABNORMAL HIGH (ref 0.70–1.11)
GFR, EST AFRICAN AMERICAN: 42 mL/min — AB (ref >=60)
GFR, Est Non African American: 36 mL/min — ABNORMAL LOW (ref >=60)
GLUCOSE: 132 mg/dL — AB (ref 65–99)
POTASSIUM: 5.1 mmol/L (ref 3.5–5.3)
Sodium: 140 mmol/L (ref 135–146)

## 2016-10-12 ENCOUNTER — Other Ambulatory Visit: Payer: Self-pay

## 2016-10-12 ENCOUNTER — Telehealth: Payer: Self-pay

## 2016-10-12 MED ORDER — "INSULIN SYRINGE-NEEDLE U-100 31G X 5/16"" 0.5 ML MISC": 0 refills | Status: DC

## 2016-10-12 NOTE — Telephone Encounter (Signed)
-----   Message from Philemon Kingdom, MD sent at 10/12/2016 12:32 PM EDT ----- [name], can you please call pt: Kidney fxn was still a little low, but stable in last year.

## 2016-10-12 NOTE — Telephone Encounter (Signed)
LVM, gave lab results. Gave call back number if any questions or concerns.  

## 2016-10-14 ENCOUNTER — Other Ambulatory Visit: Payer: Self-pay

## 2016-10-14 MED ORDER — "INSULIN SYRINGE-NEEDLE U-100 31G X 5/16"" 0.5 ML MISC": 0 refills | Status: AC

## 2016-10-20 ENCOUNTER — Other Ambulatory Visit: Payer: Self-pay | Admitting: Cardiology

## 2016-10-29 ENCOUNTER — Other Ambulatory Visit: Payer: Self-pay

## 2016-10-29 MED ORDER — ROSUVASTATIN CALCIUM 10 MG PO TABS: 10.0000 mg | ORAL_TABLET | ORAL | 1 refills | Status: DC

## 2016-11-05 ENCOUNTER — Other Ambulatory Visit: Payer: Self-pay

## 2016-11-05 MED ORDER — GLIPIZIDE 10 MG PO TABS: ORAL_TABLET | ORAL | 3 refills | Status: DC

## 2016-11-08 ENCOUNTER — Other Ambulatory Visit: Payer: Self-pay | Admitting: Endocrinology

## 2016-11-09 ENCOUNTER — Other Ambulatory Visit: Payer: Self-pay

## 2016-11-09 MED ORDER — GLIPIZIDE 10 MG PO TABS: ORAL_TABLET | ORAL | 1 refills | Status: AC

## 2016-11-09 NOTE — Telephone Encounter (Signed)
Changed glipizide dosing, as pharmacy stated you could not ER in half, covering MD changed to only one tablet daily.

## 2016-12-09 DIAGNOSIS — H04123 Dry eye syndrome of bilateral lacrimal glands: Secondary | ICD-10-CM | POA: Diagnosis not present

## 2016-12-11 ENCOUNTER — Encounter (HOSPITAL_COMMUNITY): Payer: Self-pay | Admitting: Emergency Medicine

## 2016-12-11 ENCOUNTER — Emergency Department (HOSPITAL_COMMUNITY)
Admission: EM | Admit: 2016-12-11 | Discharge: 2016-12-11 | Disposition: A | Discharge status: Home / Self Care | Payer: Medicare Other | Attending: Emergency Medicine | Admitting: Emergency Medicine

## 2016-12-11 DIAGNOSIS — Z7901 Long term (current) use of anticoagulants: Secondary | ICD-10-CM | POA: Diagnosis not present

## 2016-12-11 DIAGNOSIS — S41111A Laceration without foreign body of right upper arm, initial encounter: Secondary | ICD-10-CM | POA: Diagnosis not present

## 2016-12-11 DIAGNOSIS — Z87891 Personal history of nicotine dependence: Secondary | ICD-10-CM | POA: Diagnosis not present

## 2016-12-11 DIAGNOSIS — Y999 Unspecified external cause status: Secondary | ICD-10-CM | POA: Insufficient documentation

## 2016-12-11 DIAGNOSIS — Y929 Unspecified place or not applicable: Secondary | ICD-10-CM | POA: Diagnosis not present

## 2016-12-11 DIAGNOSIS — E039 Hypothyroidism, unspecified: Secondary | ICD-10-CM | POA: Insufficient documentation

## 2016-12-11 DIAGNOSIS — Z7984 Long term (current) use of oral hypoglycemic drugs: Secondary | ICD-10-CM | POA: Insufficient documentation

## 2016-12-11 DIAGNOSIS — N183 Chronic kidney disease, stage 3 (moderate): Secondary | ICD-10-CM | POA: Insufficient documentation

## 2016-12-11 DIAGNOSIS — Z7982 Long term (current) use of aspirin: Secondary | ICD-10-CM | POA: Diagnosis not present

## 2016-12-11 DIAGNOSIS — Z794 Long term (current) use of insulin: Secondary | ICD-10-CM | POA: Diagnosis not present

## 2016-12-11 DIAGNOSIS — W19XXXA Unspecified fall, initial encounter: Secondary | ICD-10-CM

## 2016-12-11 DIAGNOSIS — E1122 Type 2 diabetes mellitus with diabetic chronic kidney disease: Secondary | ICD-10-CM | POA: Diagnosis not present

## 2016-12-11 DIAGNOSIS — I110 Hypertensive heart disease with heart failure: Secondary | ICD-10-CM | POA: Insufficient documentation

## 2016-12-11 DIAGNOSIS — S40811A Abrasion of right upper arm, initial encounter: Secondary | ICD-10-CM | POA: Diagnosis not present

## 2016-12-11 DIAGNOSIS — S80211A Abrasion, right knee, initial encounter: Secondary | ICD-10-CM | POA: Diagnosis not present

## 2016-12-11 DIAGNOSIS — I5022 Chronic systolic (congestive) heart failure: Secondary | ICD-10-CM | POA: Insufficient documentation

## 2016-12-11 DIAGNOSIS — Y939 Activity, unspecified: Secondary | ICD-10-CM | POA: Insufficient documentation

## 2016-12-11 DIAGNOSIS — I2510 Atherosclerotic heart disease of native coronary artery without angina pectoris: Secondary | ICD-10-CM | POA: Insufficient documentation

## 2016-12-11 DIAGNOSIS — W010XXA Fall on same level from slipping, tripping and stumbling without subsequent striking against object, initial encounter: Secondary | ICD-10-CM | POA: Insufficient documentation

## 2016-12-11 DIAGNOSIS — S4991XA Unspecified injury of right shoulder and upper arm, initial encounter: Secondary | ICD-10-CM | POA: Diagnosis present

## 2016-12-11 LAB — CBG MONITORING, ED
Glucose-Capillary: 53 mg/dL — ABNORMAL LOW (ref 65–99)
Glucose-Capillary: 176 mg/dL — ABNORMAL HIGH (ref 65–99)

## 2016-12-11 MED ORDER — DEXTROSE 50 % IV SOLN
INTRAVENOUS | Status: AC
  Administered 2016-12-11: 25 mL via INTRAVENOUS
  Filled 2016-12-11: qty 50

## 2016-12-11 MED ORDER — DEXTROSE 50 % IV SOLN
25.0000 mL | Freq: Once | INTRAVENOUS | Status: AC
  Administered 2016-12-11: 25 mL via INTRAVENOUS

## 2016-12-11 NOTE — ED Triage Notes (Signed)
Pt reports blurred vision and double vision since Wednesday.  Went to eye doctor and dx with dry eye.  Pt reports this continues and made him fall today.  Skin tears noted to right arm and abrasion to right leg.

## 2016-12-11 NOTE — Discharge Instructions (Signed)
Clean laceration twice a day with soap and water gently. Follow-up with your family doctor this week

## 2016-12-11 NOTE — ED Provider Notes (Signed)
[place] DEPT Provider Note   CSN: [id number] Arrival date & time: 12/11/16  1352     History   Chief Complaint Chief Complaint  Patient presents with  . Dizziness  . Fall    HPI [name] is a 83 y.o. male.  Patient states that he tripped and fell and hurt his arm and leg.   The history is provided by the patient. No language interpreter was used.  Fall  This is a new problem. The current episode started 6 to 12 hours ago. The problem occurs rarely. The problem has been resolved. Pertinent negatives include no chest pain, no abdominal pain and no headaches. Nothing aggravates the symptoms. Nothing relieves the symptoms. He has tried nothing for the symptoms. The treatment provided mild relief.    Past Medical History:  Diagnosis Date  . Abnormal LFTs    LFTs were abnormal with acute MI March, 2013, improving at the time of discharge  . Aortic insufficiency    Moderate, echo, March, 2012  . Arthritis    hands  . Bradycardia    Sinus bradycardia  . CAD (coronary artery disease)   . Diabetes mellitus   . Dizziness     12/2009 & 12/ 2012  . Dyslipidemia   . Ejection fraction    45%, echo, March, 2012,   /  Ejection fraction after his MI March, 2013 is 45%. There is posterior basal akinesis with abnormal septal motion.  . Elevated CPK    2013, now in 2014 patient on very low dose Crestor  . Hard of hearing   . Hx of CABG   . Hyperlipidemia   . Hypertension   . Hypothyroidism   . Ischemia    .Marland KitchenMarland KitchenEF 42%  . Leg cramps    history of nighttime  . Mitral regurgitation    Moderate, echo, March, 2012  . Myocardial infarction ([place]) 3/19-27/2013   ARF, elevated LFTs  . Nocturia   . Pulmonary hypertension ([place])    Echo, March, 2012, 42 mmHg  . RBBB (right bundle branch block with left anterior fascicular block)    New July, 2010  . Shingles October 2011   Severe pain to right flank treated  . Systolic CHF ([place])   . Wears glasses     Patient Active  Problem List   Diagnosis Date Noted  . CKD (chronic kidney disease), stage III 06/24/2015  . Non-STEMI (non-ST elevated myocardial infarction) ([place]) 06/13/2015  . Carotid stenosis 06/12/2015  . Precordial chest pain 06/12/2015  . Pre-operative cardiovascular examination 02/05/2015  . Poorly controlled type 2 diabetes mellitus with circulatory disorder ([place]) 11/21/2014  . Bilateral carotid bruits 02/28/2014  . Periodontal disease 02/13/2014  . Atherosclerotic heart disease of native coronary artery with unstable angina pectoris ([place])   . Statin intolerance 07/20/2013  . Chronic systolic CHF (congestive heart failure) ([place]) 08/31/2012  . Elevated CPK   . Abnormal LFTs   . Ejection fraction   . Hypokalemia 08/10/2011  . Renal insufficiency 08/03/2011  . Hypertension   . Aortic insufficiency   . Mitral regurgitation   . Pulmonary hypertension ([place])   . Hx of CABG   . Dyslipidemia   . Hypothyroidism-TSH 9   . Bradycardia   . RBBB with LAFB   . Leg cramps   . Shingles   . Dizziness   . POSTHERPETIC NEURALGIA 04/29/2009  . VITAMIN D DEFICIENCY 02/17/2009  . ARTHRALGIA 02/12/2009  . UNSPECIFIED MYALGIA AND MYOSITIS 02/12/2009  . SHORTNESS OF  BREATH 11/28/2008  . THROMBOCYTOPENIA 08/12/2008  . ADVERSE DRUG REACTION 07/16/2008  . DM (diabetes mellitus), secondary, uncontrolled, with peripheral vascular complications ([place]) 60/63/0160  . GLAUCOMA, BORDERLINE 10/16/2007    Past Surgical History:  Procedure Laterality Date  . BACK SURGERY  1999   LS sx- 1999  . CARDIAC CATHETERIZATION  2013   occluded graft; Dr Lia Foyer  . CARDIAC SURGERY    . CORONARY ARTERY BYPASS GRAFT  1996   5 vessels  . ENDARTERECTOMY Right 06/12/2015   Procedure: ENDARTERECTOMY CAROTID WITH PATCH ANGIOPLASTY;  Surgeon: Serafina Mitchell, MD;  Location: [place];  Service: Vascular;  Laterality: Right;  . EYE SURGERY Bilateral December 2016   bilateral cataract surgery  . LEFT HEART CATHETERIZATION WITH  CORONARY ANGIOGRAM N/A 08/03/2011   Procedure: LEFT HEART CATHETERIZATION WITH CORONARY ANGIOGRAM;  Surgeon: Hillary Bow, MD;  Location: [facility] CATH LAB;  Service: Cardiovascular;  Laterality: N/A;  . LEFT HEART CATHETERIZATION WITH CORONARY/GRAFT ANGIOGRAM N/A 02/05/2014   Procedure: LEFT HEART CATHETERIZATION WITH Beatrix Fetters;  Surgeon: Troy Sine, MD;  Location: [facility] CATH LAB;  Service: Cardiovascular;  Laterality: N/A;  . SPINAL FUSION  2000   scar tissue, plate insertion/fusion @ LS spine - 2000       Home Medications    Prior to Admission medications   Medication Sig Start Date End Date Taking? Authorizing Provider  amLODipine (NORVASC) 10 MG tablet TAKE 1 TABLET BY MOUTH EVERY DAY 02/11/16  Yes Carlena Bjornstad, MD  aspirin EC 81 MG tablet Take 81 mg by mouth every morning.   Yes [provider]  benazepril (LOTENSIN) 40 MG tablet TAKE 1 TABLET BY MOUTH EVERY DAY 01/26/16  Yes Jerline Pain, MD  carvedilol (COREG) 3.125 MG tablet Take 1 tablet (3.125 mg total) by mouth 2 (two) times daily with a meal. Patient taking differently: Take 3.125 mg by mouth daily.  03/08/16  Yes Jerline Pain, MD  cholecalciferol (VITAMIN D) 1000 UNITS tablet Take 2,000 Units by mouth daily.    Yes [provider]  clopidogrel (PLAVIX) 75 MG tablet TAKE 1 TABLET BY MOUTH EVERY DAY 01/20/16  Yes Jerline Pain, MD  furosemide (LASIX) 40 MG tablet TAKE 1/2 TABLET BY MOUTH DAILY AS NEEDED 03/25/16  Yes Jerline Pain, MD  glipiZIDE (GLUCOTROL) 10 MG tablet Take 1 tablet in the morning 11/09/16  Yes Elayne Snare, MD  Insulin Syringe-Needle U-100 (BD INSULIN SYRINGE ULTRAFINE) 31G X 5/16" 0.5 ML MISC Use to inject insulin vial. 10/14/16  Yes Philemon Kingdom, MD  isosorbide mononitrate (IMDUR) 30 MG 24 hr tablet TAKE 1 TABLET BY MOUTH EVERY DAY 08/18/15  Yes Jerline Pain, MD  levothyroxine (SYNTHROID, LEVOTHROID) 50 MCG tablet TAKE ONE TABLET BY MOUTH ONCE DAILY BEFORE BREAKFAST  09/23/16  Yes Golden Circle, FNP  LUMIGAN 0.03 % ophthalmic solution Place into both eyes at bedtime and may repeat dose one time if needed. AS NEEDED FOR DRY EYES 02/26/11  Yes [provider]  nitroGLYCERIN (NITROSTAT) 0.4 MG SL tablet Place 0.4 mg under the tongue every 5 (five) minutes as needed for chest pain.   Yes [provider]  NONFORMULARY OR COMPOUNDED ITEM Diabetic testing supply Check glucose once daily if possible Fasting or morning glucose recommended M, W, F, & Sun if possible Goal= 100-150 Glucose 2 hours after breakfast Tue, after lunch Th & 2 hrs after eve meal Sat if possible Goal = < 180 07/13/12  Yes Unice Cobble  F, MD  acetaminophen (TYLENOL) 325 MG tablet Take 650 mg by mouth every 6 (six) hours as needed for mild pain or moderate pain.     [provider]  dorzolamide (TRUSOPT) 2 % ophthalmic solution Place 1 drop into both eyes daily.  03/14/11   [provider]  insulin NPH-regular Human (NOVOLIN 70/30) (70-30) 100 UNIT/ML injection Inject 35 Units into the skin 2 (two) times daily before a meal. Patient taking differently: Inject 25 Units into the skin 2 (two) times daily before a meal.  10/08/16   Philemon Kingdom, MD    Family History Family History  Problem Relation Age of Onset  . Heart attack Father 30  . Hyperlipidemia Mother   . Diabetes Sister   . Lung cancer Sister   . Diabetes Brother   . Hypertension Son   . Stroke Neg Hx     Social History Social History  Substance Use Topics  . Smoking status: Former Smoker    Packs/day: 1.00    Years: 5.00    Quit date: 05/17/1949  . Smokeless tobacco: Never Used     Comment: Quit at age 68  . Alcohol use No     Allergies   Atorvastatin; Folic acid; Nicardipine hcl; and Pravastatin   Review of Systems Review of Systems  Constitutional: Negative for appetite change and fatigue.  HENT: Negative for congestion, ear discharge and sinus pressure.   Eyes:  Negative for discharge.  Respiratory: Negative for cough.   Cardiovascular: Negative for chest pain.  Gastrointestinal: Negative for abdominal pain and diarrhea.  Genitourinary: Negative for frequency and hematuria.  Musculoskeletal: Negative for back pain.  Skin: Negative for rash.       Abrasion to right arm and right knee  Neurological: Negative for seizures and headaches.  Psychiatric/Behavioral: Negative for hallucinations.     Physical Exam Updated Vital Signs BP (!) 155/64 (BP Location: Left Arm)   Pulse 66   Temp (!) 97.5 F (36.4 C) (Oral)   Resp 15   SpO2 98%   Physical Exam  Constitutional: He is oriented to person, place, and time. He appears well-developed.  HENT:  Head: Normocephalic.  Eyes: Conjunctivae and EOM are normal. No scleral icterus.  Neck: Neck supple. No thyromegaly present.  Cardiovascular: Normal rate and regular rhythm.  Exam reveals no gallop and no friction rub.   No murmur heard. Pulmonary/Chest: No stridor. He has no wheezes. He has no rales. He exhibits no tenderness.  Abdominal: He exhibits no distension. There is no tenderness. There is no rebound.  Musculoskeletal: Normal range of motion. He exhibits no edema.  Patient has superficial laceration to his right upper arm. And abrasion to right knee  Lymphadenopathy:    He has no cervical adenopathy.  Neurological: He is oriented to person, place, and time. He exhibits normal muscle tone. Coordination normal.  Skin: No rash noted. No erythema.  Psychiatric: He has a normal mood and affect. His behavior is normal.     ED Treatments / Results  Labs (all labs ordered are listed, but only abnormal results are displayed) Labs Reviewed  CBG MONITORING, ED - Abnormal; Notable for the following:       Result Value   Glucose-Capillary 53 (*)    All other components within normal limits  CBG MONITORING, ED - Abnormal; Notable for the following:    Glucose-Capillary 176 (*)    All other  components within normal limits    EKG  EKG Interpretation None  Radiology No results found.  Procedures Procedures (including critical care time)  Medications Ordered in ED Medications  dextrose 50 % solution 25 mL (25 mLs Intravenous Given 12/11/16 1517)     Initial Impression / Assessment and Plan / ED Course  I have reviewed the triage vital signs and the nursing notes.  Pertinent labs & imaging results that were available during my care of the patient were reviewed by me and considered in my medical decision making (see chart for details).     The nurse applied Steri-Strips to the superficial laceration to right arm. Patient will follow-up with his PCP  Final Clinical Impressions(s) / ED Diagnoses   Final diagnoses:  Fall, initial encounter    New Prescriptions New Prescriptions   No medications on file     Milton Ferguson, MD 12/11/16 1710

## 2016-12-11 NOTE — ED Notes (Signed)
Steristrips applied to skin tear on right forearm.

## 2016-12-13 DIAGNOSIS — H02402 Unspecified ptosis of left eyelid: Secondary | ICD-10-CM | POA: Diagnosis not present

## 2016-12-13 DIAGNOSIS — G7001 Myasthenia gravis with (acute) exacerbation: Secondary | ICD-10-CM | POA: Diagnosis not present

## 2016-12-16 ENCOUNTER — Ambulatory Visit (INDEPENDENT_AMBULATORY_CARE_PROVIDER_SITE_OTHER): Payer: Medicare Other | Admitting: Family

## 2016-12-16 ENCOUNTER — Encounter: Payer: Self-pay | Admitting: Family

## 2016-12-16 ENCOUNTER — Other Ambulatory Visit: Payer: Medicare Other

## 2016-12-16 VITALS — BP 110/68 | HR 70 | Temp 98.0°F | Resp 16 | Ht 68.0 in | Wt 186.0 lb

## 2016-12-16 DIAGNOSIS — S41101A Unspecified open wound of right upper arm, initial encounter: Secondary | ICD-10-CM | POA: Insufficient documentation

## 2016-12-16 DIAGNOSIS — S41101D Unspecified open wound of right upper arm, subsequent encounter: Secondary | ICD-10-CM

## 2016-12-16 DIAGNOSIS — I6523 Occlusion and stenosis of bilateral carotid arteries: Secondary | ICD-10-CM | POA: Diagnosis not present

## 2016-12-16 DIAGNOSIS — H02409 Unspecified ptosis of unspecified eyelid: Secondary | ICD-10-CM | POA: Insufficient documentation

## 2016-12-16 DIAGNOSIS — H02402 Unspecified ptosis of left eyelid: Secondary | ICD-10-CM

## 2016-12-16 NOTE — Assessment & Plan Note (Signed)
Wound appears to be healing well with steri-strips in place and no evidence of infection. Several risk factors for complicated healing including age, diabetes, and anticoagulation. Continue basic wound care. Follow up for signs of infection.

## 2016-12-16 NOTE — Patient Instructions (Addendum)
Thank you for choosing [facility].  SUMMARY AND INSTRUCTIONS:  Please continue to clean with soap and water. No peroxide or alcohol.  We will check your blood work today.  A referral to neurology has been placed.   Follow up pending results.   Labs:  Please stop by the lab on the lower level of the building for your blood work. Your results will be released to [facility] (or called to you) after review, usually within 72 hours after test completion. If any changes need to be made, you will be notified at that same time.  1.) The lab is open from 7:30am to 5:30 pm Monday-Friday 2.) No appointment is necessary 3.) Fasting (if needed) is 6-8 hours after food and drink; black coffee and water are okay   Follow up:  If your symptoms worsen or fail to improve, please contact our office for further instruction, or in case of emergency go directly to the emergency room at the closest medical facility.    Myasthenia Gravis Myasthenia gravis (MG) means severe weakness. It is a long-term (chronic) condition that causes weakness in the muscles you can control (voluntary muscles). MG can affect any voluntary muscle. The muscles most often affected are the ones that control:  Eye movement.  Facial movements.  Swallowing.  MG is an autoimmune disease, which means that your body's defense system (immune system) attacks healthy parts of your body instead of germs and other things that make you sick. When you have MG, your immune system makes proteins (antibodies) that block the chemical (acetylcholine) your body needs to send nerve signals to your muscles. This causes muscle weakness. What are the causes? The exact cause of MG is unknown. One possible cause is an enlarged thymus gland, which is located under your breastbone. What are the signs or symptoms? The earliest symptom of MG is muscle weakness that gets worse with activity and gets better after rest. Other symptoms of MG may  include:  Drooping eyelids.  Double vision.  Loss of facial expression.  Trouble chewing and swallowing.  Slurred speech.  A waddling walk.  Weakness of the arms, hands, and legs.  Trouble breathing is the most dangerous symptom of MG. Sudden and severe difficulty breathing (myasthenic crisis) may require emergency breathing support. This symptom sometimes happens after:  Infection.  Fever.  Drug reaction.  How is this diagnosed? It can be hard to diagnose MG because muscle weakness is a common symptom in many conditions. Your health care provider will do a physical exam. You may also have tests that will help make a diagnosis. These may include:  A blood test.  A test using the medicine edrophonium. This medicine increases muscle strength by slowing the breakdown of acetylcholine.  Tests to measure nerve conduction to muscle (electromyography).  An imaging study of the chest (CT or MRI).  How is this treated? Treatment can improve muscle strength. Sometimes symptoms of MG go away for a while (remission) and you can stop treatment. Possible treatments include:  Medicine.  Removal of the thymus gland (thymectomy). This may result in a long remission for some people.  Follow these instructions at home:  Take medicines only as directed by your health care provider.  Get plenty of rest to conserve your energy.  Take frequent breaks to rest your eyes.  Maintain a healthy diet and a healthy weight.  Do not use any tobacco products including cigarettes, chewing tobacco, or electronic cigarettes. If you need help quitting, ask your  health care provider.  Keep all follow-up visits as directed by your health care provider. This is important. Contact a health care provider if:  Your symptoms get worse after a fever or infection.  You have a reaction to a medicine you are taking.  Your symptoms change or get worse. Get help right away if: You have trouble  breathing. This information is not intended to replace advice given to you by your health care provider. Make sure you discuss any questions you have with your health care provider. Document Released: 08/09/2000 Document Revised: 10/09/2015 Document Reviewed: 07/04/2013 Elsevier Interactive Patient Education  [employer].

## 2016-12-16 NOTE — Progress Notes (Signed)
Subjective:    Patient ID: [name], male    DOB: January 21, 1926, 83 y.o.   MRN: [id number]  Chief Complaint  Patient presents with  . Hospitalization Follow-up    had a fall and skinned his right arm up, left eye lid is dropping and causing blurred vision, needs labs done per Dr. Bing Plume states it is a muscle that is causing his eye to do that    HPI:  [name] is a 83 y.o. male who  has a past medical history of Abnormal LFTs; Aortic insufficiency; Arthritis; Bradycardia; CAD (coronary artery disease); Diabetes mellitus; Dizziness; Dyslipidemia; Ejection fraction; Elevated CPK; Hard of hearing; CABG; Hyperlipidemia; Hypertension; Hypothyroidism; Ischemia; Leg cramps; Mitral regurgitation; Myocardial infarction ([place]) (3/19-27/2013); Nocturia; Pulmonary hypertension (HCC); RBBB (right bundle branch block with left anterior fascicular block); Shingles (October 2011); Systolic CHF ([place]); and Wears glasses. and presents today for a follow up office visit.  Since leaving the ED he reports that his wounds are healing well with no evidence of infection. No fevers. Modifying factors include basic wound care. During that time he was seen by Dr. Bing Plume of Opthalmology and was noted to have concern for ptosis and possible myesthenia gravis. Opthalmology recommends follow up blood work.   Allergies  Allergen Reactions  . Atorvastatin     REACTION: myalgias  . Folic Acid     REACTION: Face burns  . Nicardipine Hcl     REACTION: Gingival hypertrophy  . Pravastatin     myalgias      Outpatient Medications Prior to Visit  Medication Sig Dispense Refill  . acetaminophen (TYLENOL) 325 MG tablet Take 650 mg by mouth every 6 (six) hours as needed for mild pain or moderate pain.     Marland Kitchen amLODipine (NORVASC) 10 MG tablet TAKE 1 TABLET BY MOUTH EVERY DAY 30 tablet 10  . aspirin EC 81 MG tablet Take 81 mg by mouth every morning.    . benazepril (LOTENSIN) 40 MG tablet TAKE 1 TABLET BY MOUTH  EVERY DAY 30 tablet 11  . Carboxymethylcellul-Glycerin (CLEAR EYES FOR DRY EYES OP) Place 1-2 drops into both eyes 2 (two) times daily as needed.    . carvedilol (COREG) 3.125 MG tablet Take 1 tablet (3.125 mg total) by mouth 2 (two) times daily with a meal. (Patient taking differently: Take 3.125 mg by mouth daily. ) 60 tablet 11  . cholecalciferol (VITAMIN D) 1000 UNITS tablet Take 2,000 Units by mouth daily.     . clopidogrel (PLAVIX) 75 MG tablet TAKE 1 TABLET BY MOUTH EVERY DAY 30 tablet 11  . dorzolamide (TRUSOPT) 2 % ophthalmic solution Place 1 drop into both eyes daily.     . furosemide (LASIX) 40 MG tablet TAKE 1/2 TABLET BY MOUTH DAILY AS NEEDED 45 tablet 2  . glipiZIDE (GLUCOTROL) 10 MG tablet Take 1 tablet in the morning 90 tablet 1  . insulin NPH-regular Human (NOVOLIN 70/30) (70-30) 100 UNIT/ML injection Inject 35 Units into the skin 2 (two) times daily before a meal. (Patient taking differently: Inject 25 Units into the skin 2 (two) times daily before a meal. ) 15 mL 11  . Insulin Syringe-Needle U-100 (BD INSULIN SYRINGE ULTRAFINE) 31G X 5/16" 0.5 ML MISC Use to inject insulin vial. 100 each 0  . isosorbide mononitrate (IMDUR) 30 MG 24 hr tablet TAKE 1 TABLET BY MOUTH EVERY DAY 30 tablet 3  . levothyroxine (SYNTHROID, LEVOTHROID) 50 MCG tablet TAKE ONE TABLET BY MOUTH ONCE DAILY BEFORE  BREAKFAST 90 tablet 1  . LUMIGAN 0.03 % ophthalmic solution Place into both eyes at bedtime and may repeat dose one time if needed. AS NEEDED FOR DRY EYES    . nitroGLYCERIN (NITROSTAT) 0.4 MG SL tablet Place 0.4 mg under the tongue every 5 (five) minutes as needed for chest pain.    . NONFORMULARY OR COMPOUNDED ITEM Diabetic testing supply Check glucose once daily if possible Fasting or morning glucose recommended M, W, F, & Sun if possible Goal= 100-150 Glucose 2 hours after breakfast Tue, after lunch Th & 2 hrs after eve meal Sat if possible Goal = < 180 1 each 0   No facility-administered  medications prior to visit.       Past Surgical History:  Procedure Laterality Date  . BACK SURGERY  1999   LS sx- 1999  . CARDIAC CATHETERIZATION  2013   occluded graft; Dr Lia Foyer  . CARDIAC SURGERY    . CORONARY ARTERY BYPASS GRAFT  1996   5 vessels  . ENDARTERECTOMY Right 06/12/2015   Procedure: ENDARTERECTOMY CAROTID WITH PATCH ANGIOPLASTY;  Surgeon: Serafina Mitchell, MD;  Location: [facility];  Service: Vascular;  Laterality: Right;  . EYE SURGERY Bilateral December 2016   bilateral cataract surgery  . LEFT HEART CATHETERIZATION WITH CORONARY ANGIOGRAM N/A 08/03/2011   Procedure: LEFT HEART CATHETERIZATION WITH CORONARY ANGIOGRAM;  Surgeon: Hillary Bow, MD;  Location: [facility] LAB;  Service: Cardiovascular;  Laterality: N/A;  . LEFT HEART CATHETERIZATION WITH CORONARY/GRAFT ANGIOGRAM N/A 02/05/2014   Procedure: LEFT HEART CATHETERIZATION WITH Beatrix Fetters;  Surgeon: Troy Sine, MD;  Location: [facility] C[facility] LAB;  Service: Cardiovascular;  Laterality: N/A;  . SPINAL FUSION  2000   scar tissue, plate insertion/fusion @ [facility] spine - 2000      Past Medical History:  Diagnosis Date  . Abnormal LFTs    LFTs were abnormal with acute MI March, 2013, improving at the time of discharge  . Aortic insufficiency    Moderate, echo, March, 2012  . Arthritis    hands  . Bradycardia    Sinus bradycardia  . CAD (coronary artery disease)   . Diabetes mellitus   . Dizziness     12/2009 & 12/ 2012  . Dyslipidemia   . Ejection fraction    45%, echo, March, 2012,   /  Ejection fraction after his MI March, 2013 is 45%. There is posterior basal akinesis with abnormal septal motion.  . Elevated CPK    2013, now in 2014 patient on very low dose Crestor  . Hard of hearing   . Hx of CABG   . Hyperlipidemia   . Hypertension   . Hypothyroidism   . Ischemia    .Marland KitchenMarland KitchenEF 42%  . Leg cramps    history of nighttime  . Mitral regurgitation    Moderate, echo, March, 2012  . Myocardial  infarction ([place]) 3/19-27/2013   ARF, elevated LFTs  . Nocturia   . Pulmonary hypertension ([place])    Echo, March, 2012, 42 mmHg  . RBBB (right bundle branch block with left anterior fascicular block)    New July, 2010  . Shingles October 2011   Severe pain to right flank treated  . Systolic CHF ([place])   . Wears glasses       Review of Systems  Constitutional: Negative for chills and fever.  HENT:       Ptosis of left eye.   Respiratory: Negative for chest tightness and shortness of  breath.   Cardiovascular: Negative for chest pain, palpitations and leg swelling.  Skin: Positive for wound. Negative for color change and pallor.  Neurological: Negative for weakness and numbness.      Objective:    BP 110/68 (BP Location: Left Arm, Patient Position: Sitting, Cuff Size: Normal)   Pulse 70   Temp 98 F (36.7 C) (Oral)   Resp 16   Ht 5\' 8"  (1.727 m)   Wt 186 lb (84.4 kg)   SpO2 93%   BMI 28.28 kg/m  Nursing note and vital signs reviewed.  Physical Exam  Constitutional: He is oriented to person, place, and time. He appears well-developed and well-nourished. No distress.  Eyes:  Ptosis and dropping of left eye lid noted.   Cardiovascular: Normal rate, regular rhythm, normal heart sounds and intact distal pulses.   Pulmonary/Chest: Effort normal and breath sounds normal.  Neurological: He is alert and oriented to person, place, and time.  Skin: Skin is warm and dry.  Right arm - Wound appears to be well approximated with steri-strips in place and mild sanguinous drainage. No evidence of infection. Distal pulses and sensation are intact and appropriate.   Psychiatric: He has a normal mood and affect. His behavior is normal. Judgment and thought content normal.       Assessment & Plan:   Problem List Items Addressed This Visit      Other   Ptosis    New onset ptosis noted by opthalmology with concern for myasthenia gravis. Obtain acetylcholine antibodies and refer to  neurology. Continue to monitor pending blood work results.       Relevant Orders   Acetylcholine Receptor Ab, All   Ambulatory referral to Neurology   Open wound of right upper extremity - Primary    Wound appears to be healing well with steri-strips in place and no evidence of infection. Several risk factors for complicated healing including age, diabetes, and anticoagulation. Continue basic wound care. Follow up for signs of infection.           I am having Mr. Novacek maintain his LUMIGAN, dorzolamide, cholecalciferol, acetaminophen, NONFORMULARY OR COMPOUNDED ITEM, aspirin EC, nitroGLYCERIN, isosorbide mononitrate, clopidogrel, benazepril, amLODipine, carvedilol, furosemide, levothyroxine, insulin NPH-regular Human, Insulin Syringe-Needle U-100, glipiZIDE, and Carboxymethylcellul-Glycerin (CLEAR EYES FOR DRY EYES OP).   Follow-up: Return in about 1 month (around 01/16/2017), or if symptoms worsen or fail to improve.  Mauricio Po, FNP

## 2016-12-16 NOTE — Assessment & Plan Note (Signed)
New onset ptosis noted by opthalmology with concern for myasthenia gravis. Obtain acetylcholine antibodies and refer to neurology. Continue to monitor pending blood work results.

## 2016-12-17 ENCOUNTER — Encounter: Payer: Self-pay | Admitting: Neurology

## 2016-12-19 ENCOUNTER — Other Ambulatory Visit: Payer: Self-pay | Admitting: Cardiology

## 2016-12-21 ENCOUNTER — Other Ambulatory Visit: Payer: Self-pay | Admitting: Cardiology

## 2016-12-23 LAB — ACETYLCHOLINE RECEPTOR AB, ALL
ACETYLCHOL BLOCK AB: 24 % (ref 0–25)
AChR Binding Ab, Serum: <0.03 nmol/L (ref 0.00–0.24)

## 2016-12-24 ENCOUNTER — Emergency Department (HOSPITAL_COMMUNITY): Payer: Medicare Other

## 2016-12-24 ENCOUNTER — Encounter (HOSPITAL_COMMUNITY): Payer: Self-pay | Admitting: Emergency Medicine

## 2016-12-24 ENCOUNTER — Emergency Department (HOSPITAL_COMMUNITY)
Admission: EM | Admit: 2016-12-24 | Discharge: 2016-12-25 | Disposition: A | Discharge status: Home / Self Care | Payer: Medicare Other | Attending: Emergency Medicine | Admitting: Emergency Medicine

## 2016-12-24 DIAGNOSIS — C7931 Secondary malignant neoplasm of brain: Secondary | ICD-10-CM

## 2016-12-24 DIAGNOSIS — E039 Hypothyroidism, unspecified: Secondary | ICD-10-CM | POA: Insufficient documentation

## 2016-12-24 DIAGNOSIS — Z87891 Personal history of nicotine dependence: Secondary | ICD-10-CM | POA: Insufficient documentation

## 2016-12-24 DIAGNOSIS — H26493 Other secondary cataract, bilateral: Secondary | ICD-10-CM | POA: Diagnosis not present

## 2016-12-24 DIAGNOSIS — I2510 Atherosclerotic heart disease of native coronary artery without angina pectoris: Secondary | ICD-10-CM | POA: Diagnosis not present

## 2016-12-24 DIAGNOSIS — H04123 Dry eye syndrome of bilateral lacrimal glands: Secondary | ICD-10-CM | POA: Diagnosis not present

## 2016-12-24 DIAGNOSIS — Z7982 Long term (current) use of aspirin: Secondary | ICD-10-CM | POA: Diagnosis not present

## 2016-12-24 DIAGNOSIS — E1122 Type 2 diabetes mellitus with diabetic chronic kidney disease: Secondary | ICD-10-CM | POA: Diagnosis not present

## 2016-12-24 DIAGNOSIS — Z7901 Long term (current) use of anticoagulants: Secondary | ICD-10-CM | POA: Insufficient documentation

## 2016-12-24 DIAGNOSIS — I5022 Chronic systolic (congestive) heart failure: Secondary | ICD-10-CM | POA: Diagnosis not present

## 2016-12-24 DIAGNOSIS — S299XXA Unspecified injury of thorax, initial encounter: Secondary | ICD-10-CM | POA: Diagnosis not present

## 2016-12-24 DIAGNOSIS — Z79899 Other long term (current) drug therapy: Secondary | ICD-10-CM | POA: Diagnosis not present

## 2016-12-24 DIAGNOSIS — H02409 Unspecified ptosis of unspecified eyelid: Secondary | ICD-10-CM | POA: Insufficient documentation

## 2016-12-24 DIAGNOSIS — H5712 Ocular pain, left eye: Secondary | ICD-10-CM | POA: Diagnosis not present

## 2016-12-24 DIAGNOSIS — H02402 Unspecified ptosis of left eyelid: Secondary | ICD-10-CM | POA: Diagnosis not present

## 2016-12-24 DIAGNOSIS — J90 Pleural effusion, not elsewhere classified: Secondary | ICD-10-CM | POA: Diagnosis not present

## 2016-12-24 DIAGNOSIS — Z794 Long term (current) use of insulin: Secondary | ICD-10-CM | POA: Diagnosis not present

## 2016-12-24 DIAGNOSIS — H538 Other visual disturbances: Secondary | ICD-10-CM | POA: Diagnosis not present

## 2016-12-24 DIAGNOSIS — C719 Malignant neoplasm of brain, unspecified: Secondary | ICD-10-CM | POA: Diagnosis not present

## 2016-12-24 DIAGNOSIS — I130 Hypertensive heart and chronic kidney disease with heart failure and stage 1 through stage 4 chronic kidney disease, or unspecified chronic kidney disease: Secondary | ICD-10-CM | POA: Diagnosis not present

## 2016-12-24 DIAGNOSIS — N183 Chronic kidney disease, stage 3 (moderate): Secondary | ICD-10-CM | POA: Diagnosis not present

## 2016-12-24 DIAGNOSIS — E119 Type 2 diabetes mellitus without complications: Secondary | ICD-10-CM | POA: Diagnosis not present

## 2016-12-24 LAB — CBC WITH DIFFERENTIAL/PLATELET
Basophils Absolute: 0 10*3/uL (ref 0.0–0.1)
Basophils Relative: 0 %
EOS ABS: 0.2 10*3/uL (ref 0.0–0.7)
Eosinophils Relative: 2 %
HEMATOCRIT: 42.1 % (ref 39.0–52.0)
HEMOGLOBIN: 14.1 g/dL (ref 13.0–17.0)
LYMPHS ABS: 2.3 10*3/uL (ref 0.7–4.0)
Lymphocytes Relative: 27 %
MCH: 30.9 pg (ref 26.0–34.0)
MCHC: 33.5 g/dL (ref 30.0–36.0)
MCV: 92.3 fL (ref 78.0–100.0)
MONOS PCT: 8 %
Monocytes Absolute: 0.7 10*3/uL (ref 0.1–1.0)
NEUTROS PCT: 63 %
Neutro Abs: 5.3 10*3/uL (ref 1.7–7.7)
Platelets: 240 10*3/uL (ref 150–400)
RBC: 4.56 MIL/uL (ref 4.22–5.81)
RDW: 13.4 % (ref 11.5–15.5)
WBC: 8.4 10*3/uL (ref 4.0–10.5)

## 2016-12-24 LAB — BASIC METABOLIC PANEL
Anion gap: 10 (ref 5–15)
BUN: 21 mg/dL — ABNORMAL HIGH (ref 6–20)
CHLORIDE: 105 mmol/L (ref 101–111)
CO2: 26 mmol/L (ref 22–32)
CREATININE: 1.54 mg/dL — AB (ref 0.61–1.24)
Calcium: 9.4 mg/dL (ref 8.9–10.3)
GFR calc non Af Amer: 39 mL/min — ABNORMAL LOW (ref >60)
GFR, EST AFRICAN AMERICAN: 45 mL/min — AB (ref >60)
Glucose, Bld: 213 mg/dL — ABNORMAL HIGH (ref 65–99)
Potassium: 5 mmol/L (ref 3.5–5.1)
Sodium: 141 mmol/L (ref 135–145)

## 2016-12-24 MED ORDER — ACETAMINOPHEN 500 MG PO TABS
1000.0000 mg | ORAL_TABLET | Freq: Once | ORAL | Status: AC
  Administered 2016-12-25: 1000 mg via ORAL
  Filled 2016-12-24: qty 2

## 2016-12-24 NOTE — ED Provider Notes (Signed)
[facility] DEPT Provider Note   CSN: [id number] Arrival date & time: 12/24/16  1650     History   Chief Complaint Chief Complaint  Patient presents with  . Eye Problem    HPI [name] is a 83 y.o. male with a hx of significant cardiac disease presents to the Emergency Department complaining of gradual, persistent, progressively worsening blurred vision onset 3 weeks ago.  Pt has been following with ophthalmology for this.  He reports 1.5 weeks ago he developed pain in the left eye and lid lag that worsens throughout the day.  He has ptosis of the LEFT eye only.  No progressive extremity weakness. Associated symptoms include clear lacrimation from the left eye.  Nothing makes it better and nothing makes it worse.  Pt denies fever, chills, neck pain, neck stiffness, chest pain, SOB, abd pain, N/V/D.  Ophthalmology note states pt with diplopia, but pt is clear that he does not have double vision only blurry vision.      The history is provided by the patient and medical records. No language interpreter was used.    Past Medical History:  Diagnosis Date  . Abnormal LFTs    LFTs were abnormal with acute MI March, 2013, improving at the time of discharge  . Aortic insufficiency    Moderate, echo, March, 2012  . Arthritis    hands  . Bradycardia    Sinus bradycardia  . CAD (coronary artery disease)   . Diabetes mellitus   . Dizziness     12/2009 & 12/ 2012  . Dyslipidemia   . Ejection fraction    45%, echo, March, 2012,   /  Ejection fraction after his MI March, 2013 is 45%. There is posterior basal akinesis with abnormal septal motion.  . Elevated CPK    2013, now in 2014 patient on very low dose Crestor  . Hard of hearing   . Hx of CABG   . Hyperlipidemia   . Hypertension   . Hypothyroidism   . Ischemia    .Marland KitchenMarland KitchenEF 42%  . Leg cramps    history of nighttime  . Mitral regurgitation    Moderate, echo, March, 2012  . Myocardial infarction ([place]) 3/19-27/2013   ARF,  elevated LFTs  . Nocturia   . Pulmonary hypertension ([place])    Echo, March, 2012, 42 mmHg  . RBBB (right bundle branch block with left anterior fascicular block)    New July, 2010  . Shingles October 2011   Severe pain to right flank treated  . Systolic CHF ([place])   . Wears glasses     Patient Active Problem List   Diagnosis Date Noted  . Ptosis 12/16/2016  . Open wound of right upper extremity 12/16/2016  . CKD (chronic kidney disease), stage III 06/24/2015  . Non-STEMI (non-ST elevated myocardial infarction) ([place]) 06/13/2015  . Carotid stenosis 06/12/2015  . Precordial chest pain 06/12/2015  . Pre-operative cardiovascular examination 02/05/2015  . Poorly controlled type 2 diabetes mellitus with circulatory disorder ([place]) 11/21/2014  . Bilateral carotid bruits 02/28/2014  . Periodontal disease 02/13/2014  . Atherosclerotic heart disease of native coronary artery with unstable angina pectoris ([place])   . Statin intolerance 07/20/2013  . Chronic systolic CHF (congestive heart failure) ([place]) 08/31/2012  . Elevated CPK   . Abnormal LFTs   . Ejection fraction   . Hypokalemia 08/10/2011  . Renal insufficiency 08/03/2011  . Hypertension   . Aortic insufficiency   . Mitral regurgitation   .  Pulmonary hypertension ([place])   . Hx of CABG   . Dyslipidemia   . Hypothyroidism-TSH 9   . Bradycardia   . RBBB with LAFB   . Leg cramps   . Shingles   . Dizziness   . POSTHERPETIC NEURALGIA 04/29/2009  . VITAMIN D DEFICIENCY 02/17/2009  . ARTHRALGIA 02/12/2009  . UNSPECIFIED MYALGIA AND MYOSITIS 02/12/2009  . SHORTNESS OF BREATH 11/28/2008  . THROMBOCYTOPENIA 08/12/2008  . ADVERSE DRUG REACTION 07/16/2008  . DM (diabetes mellitus), secondary, uncontrolled, with peripheral vascular complications ([place]) 71/10/2692  . GLAUCOMA, BORDERLINE 10/16/2007    Past Surgical History:  Procedure Laterality Date  . BACK SURGERY  1999   LS sx- 1999  . CARDIAC CATHETERIZATION  2013   occluded  graft; Dr Lia Foyer  . CARDIAC SURGERY    . CORONARY ARTERY BYPASS GRAFT  1996   5 vessels  . ENDARTERECTOMY Right 06/12/2015   Procedure: ENDARTERECTOMY CAROTID WITH PATCH ANGIOPLASTY;  Surgeon: Serafina Mitchell, MD;  Location: [facility];  Service: Vascular;  Laterality: Right;  . EYE SURGERY Bilateral December 2016   bilateral cataract surgery  . LEFT HEART CATHETERIZATION WITH CORONARY ANGIOGRAM N/A 08/03/2011   Procedure: LEFT HEART CATHETERIZATION WITH CORONARY ANGIOGRAM;  Surgeon: Hillary Bow, MD;  Location: [facility] CATH LAB;  Service: Cardiovascular;  Laterality: N/A;  . LEFT HEART CATHETERIZATION WITH CORONARY/GRAFT ANGIOGRAM N/A 02/05/2014   Procedure: LEFT HEART CATHETERIZATION WITH Beatrix Fetters;  Surgeon: Troy Sine, MD;  Location: [facility] C[facility] LAB;  Service: Cardiovascular;  Laterality: N/A;  . SPINAL FUSION  2000   scar tissue, plate insertion/fusion @ LS spine - 2000       Home Medications    Prior to Admission medications   Medication Sig Start Date End Date Taking? Authorizing Provider  acetaminophen (TYLENOL) 650 MG CR tablet Take 650-1,300 mg by mouth every 8 (eight) hours as needed for pain.   Yes [provider]  amLODipine (NORVASC) 10 MG tablet TAKE 1 TABLET BY MOUTH EVERY DAY Patient taking differently: TAKE 1 TABLET (10 MG) BY MOUTH EVERY DAY 12/21/16  Yes Jerline Pain, MD  aspirin EC 81 MG tablet Take 81 mg by mouth daily.    Yes [provider]  benazepril (LOTENSIN) 40 MG tablet TAKE 1 TABLET BY MOUTH EVERY DAY Patient taking differently: TAKE 1 TABLET (40 MG) BY MOUTH EVERY DAY 01/26/16  Yes Skains, Thana Farr, MD  bimatoprost (LUMIGAN) 0.01 % SOLN Place 1 drop into both eyes at bedtime.   Yes [provider]  brinzolamide (AZOPT) 1 % ophthalmic suspension Place 1 drop into both eyes daily.   Yes [provider]  carvedilol (COREG) 3.125 MG tablet Take 1 tablet (3.125 mg total) by mouth 2 (two) times daily with a  meal. Patient taking differently: Take 3.125 mg by mouth daily.  03/08/16  Yes Jerline Pain, MD  cholecalciferol (VITAMIN D) 1000 UNITS tablet Take 2,000 Units by mouth daily.    Yes [provider]  clopidogrel (PLAVIX) 75 MG tablet TAKE 1 TABLET BY MOUTH EVERY DAY Patient taking differently: TAKE 1 TABLET (75 MG) BY MOUTH EVERY DAY 12/21/16  Yes Jerline Pain, MD  furosemide (LASIX) 40 MG tablet TAKE 1/2 TABLET BY MOUTH DAILY AS NEEDED Patient taking differently: TAKE 1/2 TABLET (20 MG) BY MOUTH DAILY AS NEEDED FOR FEET AND ANKLE SWELLING 12/20/16  Yes Jerline Pain, MD  glipiZIDE (GLUCOTROL) 10 MG tablet Take 1 tablet in the morning Patient taking differently:  Take 10 mg by mouth daily before breakfast.  11/09/16  Yes Elayne Snare, MD  insulin NPH-regular Human (NOVOLIN 70/30) (70-30) 100 UNIT/ML injection Inject 35 Units into the skin 2 (two) times daily before a meal. Patient taking differently: Inject 25 Units into the skin 2 (two) times daily before a meal.  10/08/16  Yes Philemon Kingdom, MD  isosorbide mononitrate (IMDUR) 30 MG 24 hr tablet TAKE 1 TABLET BY MOUTH EVERY DAY Patient taking differently: TAKE 1 TABLET (30 MG) BY MOUTH EVERY DAY 08/18/15  Yes Jerline Pain, MD  levothyroxine (SYNTHROID, LEVOTHROID) 50 MCG tablet TAKE ONE TABLET BY MOUTH ONCE DAILY BEFORE BREAKFAST Patient taking differently: TAKE ONE TABLET ( 50 MCG) BY MOUTH ONCE DAILY BEFORE BREAKFAST 09/23/16  Yes Golden Circle, FNP  nitroGLYCERIN (NITROSTAT) 0.4 MG SL tablet Place 0.4 mg under the tongue every 5 (five) minutes as needed for chest pain.   Yes [provider]  polyvinyl alcohol (ARTIFICIAL TEARS) 1.4 % ophthalmic solution Place 1 drop into both eyes daily as needed for dry eyes.   Yes [provider]  rosuvastatin (CRESTOR) 10 MG tablet Take 10 mg by mouth See admin instructions. Take 1 tablet (10 mg) by mouth twice a week - Sunday and Wednesday mornings 10/29/16  Yes [provider]  Insulin Syringe-Needle U-100 (BD INSULIN SYRINGE ULTRAFINE) 31G X 5/16" 0.5 ML MISC Use to inject insulin vial. 10/14/16   Philemon Kingdom, MD  NONFORMULARY OR COMPOUNDED ITEM Diabetic testing supply Check glucose once daily if possible Fasting or morning glucose recommended M, W, F, & Sun if possible Goal= 100-150 Glucose 2 hours after breakfast Tue, after lunch Th & 2 hrs after eve meal Sat if possible Goal = < 180 07/13/12   Hendricks Limes, MD    Family History Family History  Problem Relation Age of Onset  . Heart attack Father 21  . Hyperlipidemia Mother   . Diabetes Sister   . Lung cancer Sister   . Diabetes Brother   . Hypertension Son   . Stroke Neg Hx     Social History Social History  Substance Use Topics  . Smoking status: Former Smoker    Packs/day: 1.00    Years: 5.00    Quit date: 05/17/1949  . Smokeless tobacco: Never Used     Comment: Quit at age 52  . Alcohol use No     Allergies   Atorvastatin; Folic acid; Nicardipine hcl; and Pravastatin   Review of Systems Review of Systems  Constitutional: Negative for appetite change, diaphoresis, fatigue, fever and unexpected weight change.  HENT: Negative for mouth sores.   Eyes: Positive for pain, discharge ( lacrimation) and visual disturbance ( blurry).  Respiratory: Negative for cough, chest tightness, shortness of breath and wheezing.   Cardiovascular: Negative for chest pain.  Gastrointestinal: Negative for abdominal pain, constipation, diarrhea, nausea and vomiting.  Endocrine: Negative for polydipsia, polyphagia and polyuria.  Genitourinary: Negative for dysuria, frequency, hematuria and urgency.  Musculoskeletal: Negative for back pain and neck stiffness.  Skin: Negative for rash.  Allergic/Immunologic: Negative for immunocompromised state.  Neurological: Positive for headaches. Negative for syncope and light-headedness.  Hematological: Does not bruise/bleed easily.   Psychiatric/Behavioral: Negative for sleep disturbance. The patient is not nervous/anxious.   All other systems reviewed and are negative.    Physical Exam Updated Vital Signs BP (!) 151/64   Pulse 67   Temp 98.2 F (36.8 C) (Oral)   Resp 16   SpO2  97%   Physical Exam  Constitutional: He appears well-developed and well-nourished. No distress.  Awake, alert, nontoxic appearance  HENT:  Head: Normocephalic and atraumatic.  Mouth/Throat: Oropharynx is clear and moist. No oropharyngeal exudate.  Eyes: Conjunctivae are normal. Right eye exhibits no chemosis, no discharge and no exudate. Left eye exhibits no chemosis, no discharge and no exudate. No scleral icterus. Right eye exhibits normal extraocular motion. Left eye exhibits abnormal extraocular motion. Pupils are unequal.  Decreased EOMs of the left eye Ptosis of the left eye Miosis of the left eye  Neck: Normal range of motion. Neck supple. No JVD present. Carotid bruit is not present.  No swelling or mass of the neck  Cardiovascular: Normal rate, regular rhythm and intact distal pulses.   Pulmonary/Chest: Effort normal and breath sounds normal. No respiratory distress. He has no wheezes.  Equal chest expansion  Abdominal: Soft. Bowel sounds are normal. He exhibits no mass. There is no tenderness. There is no rebound and no guarding.  Musculoskeletal: Normal range of motion. He exhibits no edema.  Neurological: He is alert.  Speech is clear and goal oriented Moves extremities without ataxia  Skin: Skin is warm and dry. He is not diaphoretic.  Psychiatric: He has a normal mood and affect.  Nursing note and vitals reviewed.    ED Treatments / Results  Labs (all labs ordered are listed, but only abnormal results are displayed) Labs Reviewed  BASIC METABOLIC PANEL - Abnormal; Notable for the following:       Result Value   Glucose, Bld 213 (*)    BUN 21 (*)    Creatinine, Ser 1.54 (*)    GFR calc non Af Amer 39 (*)     GFR calc Af Amer 45 (*)    All other components within normal limits  CBC WITH DIFFERENTIAL/PLATELET    Radiology Dg Chest 2 View  Result Date: 12/25/2016 CLINICAL DATA:  Golden Circle today, weakness. History of pulmonary hypertension. EXAM: CHEST  2 VIEW COMPARISON:  Chest radiograph Oct 12, 2014 FINDINGS: The cardiac silhouette is mild-to-moderately enlarged, unchanged. Tortuous calcified aorta. Blunting of the RIGHT costophrenic angle. Trachea projects midline and there is no pneumothorax. Mild bronchitic changes. Status post median sternotomy for CABG. Soft tissue planes and included osseous structures are nonsuspicious. IMPRESSION: Small RIGHT pleural effusion.  Mild bronchitic changes. Stable cardiomegaly. Aortic Atherosclerosis (ICD10-I70.0). Electronically Signed   By: Elon Alas M.D.   On: 12/25/2016 00:55   Ct Head Wo Contrast  Result Date: 12/24/2016 CLINICAL DATA:  Chronic left eye pain. Subacute onset of blurred vision and tearing at the left eye. Initial encounter. EXAM: CT HEAD WITHOUT CONTRAST TECHNIQUE: Contiguous axial images were obtained from the base of the skull through the vertex without intravenous contrast. COMPARISON:  CT of the head performed 10/12/2014 FINDINGS: Brain: No evidence of acute infarction, hemorrhage, hydrocephalus, extra-axial collection or mass lesion/mass effect. Prominence of the ventricles and sulci reflects mild to moderate cortical volume loss. Mild cerebellar atrophy is noted. Scattered periventricular white matter change likely reflects small vessel ischemic microangiopathy. A chronic infarct is again noted at the high right posterior parietal lobe. There is a new chronic appearing infarct more anteriorly at the high right parietal lobe. A chronic lacunar infarct is noted at the right cerebellar hemisphere. The brainstem and fourth ventricle are within normal limits. The basal ganglia are unremarkable in appearance. No mass effect or midline shift is seen.  Vascular: No hyperdense vessel or unexpected calcification. Skull: There is  no evidence of fracture; visualized osseous structures are unremarkable in appearance. Sinuses/Orbits: The orbits are within normal limits. The paranasal sinuses and mastoid air cells are well-aerated. Other: No significant soft tissue abnormalities are seen. IMPRESSION: 1. No acute intracranial pathology seen on CT. The left orbit is unremarkable in appearance. 2. Mild to moderate cortical volume loss and scattered small vessel ischemic microangiopathy. 3. Chronic infarct at the high right posterior parietal lobe. New chronic appearing infarct also noted more anteriorly at the high right parietal lobe. 4. Chronic lacunar infarct at the right cerebellar hemisphere. Electronically Signed   By: Garald Balding M.D.   On: 12/24/2016 23:52   Mr Brain Wo Contrast  Result Date: 12/25/2016 CLINICAL DATA:  Blurry vision, chronic LEFT eye pain. History of hypertension, diabetes. EXAM: MRI HEAD WITHOUT CONTRAST TECHNIQUE: Multiplanar, multiecho pulse sequences of the brain and surrounding structures were obtained without intravenous contrast. COMPARISON:  CT HEAD December 24, 2016 FINDINGS: BRAIN: No reduced diffusion to suggest acute ischemia. No susceptibility artifact to suggest hemorrhage. Old small bilateral cerebellar infarcts with superimposed faint susceptibility artifact RIGHT cerebellum compatible with mineralization. RIGHT parietoccipital lobe encephalomalacia with faint susceptibility artifact. Small area LEFT parietoccipital and RIGHT frontal parent vasogenic edema with subcentimeter cysts. Old RIGHT basal ganglia lacunar infarct. Few scattered subcentimeter supratentorial white matter FLAIR T2 hyperintensities are less than expected for age, compatible with mild chronic small vessel ischemic disease. Ventricles and sulci are normal for patient's age. No midline shift from mass-effect, no masses. No abnormal extra-axial fluid  collections. VASCULAR: Normal major intracranial vascular flow voids present at skull base. SKULL AND UPPER CERVICAL SPINE: No abnormal sellar expansion. Abnormal low T1, intermediate FLAIR signal LEFT 5 a/central skullbase. Craniocervical junction maintained. SINUSES/ORBITS: The mastoid air-cells and included paranasal sinuses are well-aerated. The included ocular globes and orbital contents are non-suspicious. Status post bilateral ocular lens implants. OTHER: None. IMPRESSION: 1. No acute intracranial process. 2. RIGHT frontal and LEFT parietoccipital lobe vasogenic edema and suspected small cystic metastasis. LEFT clival/ central skullbase metastasis. Recommend MRI of the brain with contrast. 3. 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 Old RIGHT basal ganglia lacunar infarct. Acute findings discussed with and reconfirmed by.[name] on 12/25/2016 at 2:30 am. Electronically Signed   By: Elon Alas M.D.   On: 12/25/2016 02:42    Procedures Procedures (including critical care time)  Medications Ordered in ED Medications  acetaminophen (TYLENOL) tablet 1,000 mg (1,000 mg Oral Given 12/25/16 0013)     Initial Impression / Assessment and Plan / ED Course  I have reviewed the triage vital signs and the nursing notes.  Pertinent labs & imaging results that were available during my care of the patient were reviewed by me and considered in my medical decision making (see chart for details).      Patient presents with left eye ptosis and blurred vision. No clinical evidence of shingles. Patient had significant ophthalmologic workup today.  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  CXR without mass or widening of his mediastinum.  Cardiomegaly appears to be baseline.  RIGHT pleural effusion noted. Pt is without SOB.    CT scan was without acute abnormality however MRI showed right frontal and left parieto-occipital lobe vasogenic edema and suspected  small cystic metastasis. Patient will need MRI with contrast and PET scan outpatient.  Long discussion with patient and family about findings. They will be referred to oncology for further evaluation and treatment. They state understanding and are in agreement with  the plan.  The patient was discussed with and seen by Dr. Randal Buba who agrees with the treatment plan.   Final Clinical Impressions(s) / ED Diagnoses   Final diagnoses:  Ptosis of left eyelid  Brain metastases (HCC)  Pleural effusion on right    New Prescriptions New Prescriptions   No medications on file     Agapito Games 12/25/16 0618    Palumbo, April, MD 12/25/16 [phone]

## 2016-12-24 NOTE — ED Triage Notes (Signed)
Patient reports chronic left eye pain , lacrimation and blurred vision for 3 weeks , seen at eye clinic today sent here for further evaluation , denies injury /no  pain at arrival .

## 2016-12-24 NOTE — ED Provider Notes (Signed)
Medical screening examination/treatment/procedure(s) were conducted as a shared visit with non-physician practitioner(s) and myself.  I personally evaluated the patient during the encounter. No diplopia, has blurry vision, no CP no SOB.  No weakness no changes in speech.    PE:    NCAT Ptosis with miosis on the left, no chemosis MMM Neck is supple, no bruits no tenderness  RRR CTAB NABS, soft non tender FROM  Plan CXR and MRI    Renne Platts, MD 12/24/16 2333

## 2016-12-25 ENCOUNTER — Emergency Department (HOSPITAL_COMMUNITY): Payer: Medicare Other

## 2016-12-25 DIAGNOSIS — H538 Other visual disturbances: Secondary | ICD-10-CM | POA: Diagnosis not present

## 2016-12-25 DIAGNOSIS — H5712 Ocular pain, left eye: Secondary | ICD-10-CM | POA: Diagnosis not present

## 2016-12-25 DIAGNOSIS — H02409 Unspecified ptosis of unspecified eyelid: Secondary | ICD-10-CM | POA: Diagnosis not present

## 2016-12-25 DIAGNOSIS — S299XXA Unspecified injury of thorax, initial encounter: Secondary | ICD-10-CM | POA: Diagnosis not present

## 2016-12-25 NOTE — Discharge Instructions (Signed)
1. Medications: usual home medications 2. Treatment: rest, drink plenty of fluids,  3. Follow Up: Please followup with your primary doctor and oncology in 3 days for discussion of your diagnoses and further evaluation after today's visit; if you do not have a primary care doctor use the resource guide provided to find one; Please return to the ER for new or worsening symptoms.

## 2016-12-29 DIAGNOSIS — C801 Malignant (primary) neoplasm, unspecified: Secondary | ICD-10-CM | POA: Diagnosis present

## 2016-12-29 DIAGNOSIS — Z87891 Personal history of nicotine dependence: Secondary | ICD-10-CM | POA: Diagnosis not present

## 2016-12-29 DIAGNOSIS — G936 Cerebral edema: Secondary | ICD-10-CM | POA: Diagnosis present

## 2016-12-29 DIAGNOSIS — R918 Other nonspecific abnormal finding of lung field: Secondary | ICD-10-CM | POA: Diagnosis present

## 2016-12-29 DIAGNOSIS — I25110 Atherosclerotic heart disease of native coronary artery with unstable angina pectoris: Secondary | ICD-10-CM | POA: Diagnosis not present

## 2016-12-29 DIAGNOSIS — R590 Localized enlarged lymph nodes: Secondary | ICD-10-CM | POA: Diagnosis not present

## 2016-12-29 DIAGNOSIS — I129 Hypertensive chronic kidney disease with stage 1 through stage 4 chronic kidney disease, or unspecified chronic kidney disease: Secondary | ICD-10-CM | POA: Diagnosis present

## 2016-12-29 DIAGNOSIS — E118 Type 2 diabetes mellitus with unspecified complications: Secondary | ICD-10-CM | POA: Diagnosis not present

## 2016-12-29 DIAGNOSIS — Z951 Presence of aortocoronary bypass graft: Secondary | ICD-10-CM | POA: Diagnosis not present

## 2016-12-29 DIAGNOSIS — C7951 Secondary malignant neoplasm of bone: Secondary | ICD-10-CM | POA: Diagnosis present

## 2016-12-29 DIAGNOSIS — J910 Malignant pleural effusion: Secondary | ICD-10-CM | POA: Diagnosis present

## 2016-12-29 DIAGNOSIS — Z7982 Long term (current) use of aspirin: Secondary | ICD-10-CM | POA: Diagnosis not present

## 2016-12-29 DIAGNOSIS — Z794 Long term (current) use of insulin: Secondary | ICD-10-CM | POA: Diagnosis not present

## 2016-12-29 DIAGNOSIS — Z955 Presence of coronary angioplasty implant and graft: Secondary | ICD-10-CM | POA: Diagnosis not present

## 2016-12-29 DIAGNOSIS — H538 Other visual disturbances: Secondary | ICD-10-CM | POA: Diagnosis not present

## 2016-12-29 DIAGNOSIS — Z79899 Other long term (current) drug therapy: Secondary | ICD-10-CM | POA: Diagnosis not present

## 2016-12-29 DIAGNOSIS — R079 Chest pain, unspecified: Secondary | ICD-10-CM | POA: Diagnosis not present

## 2016-12-29 DIAGNOSIS — Z801 Family history of malignant neoplasm of trachea, bronchus and lung: Secondary | ICD-10-CM | POA: Diagnosis not present

## 2016-12-29 DIAGNOSIS — M898X0 Other specified disorders of bone, multiple sites: Secondary | ICD-10-CM | POA: Diagnosis not present

## 2016-12-29 DIAGNOSIS — I4510 Unspecified right bundle-branch block: Secondary | ICD-10-CM | POA: Diagnosis present

## 2016-12-29 DIAGNOSIS — E119 Type 2 diabetes mellitus without complications: Secondary | ICD-10-CM | POA: Diagnosis not present

## 2016-12-29 DIAGNOSIS — C7931 Secondary malignant neoplasm of brain: Secondary | ICD-10-CM | POA: Diagnosis present

## 2016-12-29 DIAGNOSIS — G939 Disorder of brain, unspecified: Secondary | ICD-10-CM | POA: Diagnosis not present

## 2016-12-29 DIAGNOSIS — R970 Elevated carcinoembryonic antigen [CEA]: Secondary | ICD-10-CM | POA: Diagnosis not present

## 2016-12-29 DIAGNOSIS — Z7901 Long term (current) use of anticoagulants: Secondary | ICD-10-CM | POA: Diagnosis not present

## 2016-12-29 DIAGNOSIS — G44019 Episodic cluster headache, not intractable: Secondary | ICD-10-CM | POA: Diagnosis not present

## 2016-12-29 DIAGNOSIS — G9389 Other specified disorders of brain: Secondary | ICD-10-CM | POA: Diagnosis not present

## 2016-12-29 DIAGNOSIS — Z7902 Long term (current) use of antithrombotics/antiplatelets: Secondary | ICD-10-CM | POA: Diagnosis not present

## 2016-12-29 DIAGNOSIS — R51 Headache: Secondary | ICD-10-CM | POA: Diagnosis not present

## 2016-12-29 DIAGNOSIS — R609 Edema, unspecified: Secondary | ICD-10-CM | POA: Diagnosis not present

## 2016-12-29 DIAGNOSIS — R9431 Abnormal electrocardiogram [ECG] [EKG]: Secondary | ICD-10-CM | POA: Diagnosis not present

## 2016-12-29 DIAGNOSIS — N183 Chronic kidney disease, stage 3 (moderate): Secondary | ICD-10-CM | POA: Diagnosis present

## 2016-12-29 DIAGNOSIS — E1122 Type 2 diabetes mellitus with diabetic chronic kidney disease: Secondary | ICD-10-CM | POA: Diagnosis present

## 2016-12-29 DIAGNOSIS — I2510 Atherosclerotic heart disease of native coronary artery without angina pectoris: Secondary | ICD-10-CM | POA: Diagnosis not present

## 2016-12-29 DIAGNOSIS — G4489 Other headache syndrome: Secondary | ICD-10-CM | POA: Diagnosis not present

## 2016-12-30 ENCOUNTER — Telehealth: Payer: Self-pay

## 2016-12-30 NOTE — Telephone Encounter (Signed)
Notified that pt will be sent at [facility].

## 2016-12-31 ENCOUNTER — Ambulatory Visit: Payer: Medicare Other | Admitting: Neurology

## 2017-01-04 DIAGNOSIS — C3431 Malignant neoplasm of lower lobe, right bronchus or lung: Secondary | ICD-10-CM | POA: Diagnosis not present

## 2017-01-04 DIAGNOSIS — R911 Solitary pulmonary nodule: Secondary | ICD-10-CM | POA: Diagnosis not present

## 2017-01-04 DIAGNOSIS — C3411 Malignant neoplasm of upper lobe, right bronchus or lung: Secondary | ICD-10-CM | POA: Diagnosis not present

## 2017-01-04 DIAGNOSIS — R918 Other nonspecific abnormal finding of lung field: Secondary | ICD-10-CM | POA: Diagnosis not present

## 2017-01-05 DIAGNOSIS — C801 Malignant (primary) neoplasm, unspecified: Secondary | ICD-10-CM | POA: Diagnosis not present

## 2017-01-06 DIAGNOSIS — C7951 Secondary malignant neoplasm of bone: Secondary | ICD-10-CM | POA: Diagnosis not present

## 2017-01-06 DIAGNOSIS — C3490 Malignant neoplasm of unspecified part of unspecified bronchus or lung: Secondary | ICD-10-CM | POA: Diagnosis not present

## 2017-01-06 DIAGNOSIS — H4900 Third [oculomotor] nerve palsy, unspecified eye: Secondary | ICD-10-CM | POA: Diagnosis not present

## 2017-01-06 DIAGNOSIS — C7931 Secondary malignant neoplasm of brain: Secondary | ICD-10-CM | POA: Diagnosis not present

## 2017-01-12 DIAGNOSIS — Z79899 Other long term (current) drug therapy: Secondary | ICD-10-CM | POA: Diagnosis not present

## 2017-01-12 DIAGNOSIS — C3491 Malignant neoplasm of unspecified part of right bronchus or lung: Secondary | ICD-10-CM | POA: Diagnosis not present

## 2017-01-12 DIAGNOSIS — Z951 Presence of aortocoronary bypass graft: Secondary | ICD-10-CM | POA: Diagnosis not present

## 2017-01-12 DIAGNOSIS — Z9221 Personal history of antineoplastic chemotherapy: Secondary | ICD-10-CM | POA: Diagnosis not present

## 2017-01-12 DIAGNOSIS — Z9889 Other specified postprocedural states: Secondary | ICD-10-CM | POA: Diagnosis not present

## 2017-01-12 DIAGNOSIS — D72829 Elevated white blood cell count, unspecified: Secondary | ICD-10-CM | POA: Diagnosis not present

## 2017-01-12 DIAGNOSIS — I2510 Atherosclerotic heart disease of native coronary artery without angina pectoris: Secondary | ICD-10-CM | POA: Diagnosis not present

## 2017-01-12 DIAGNOSIS — C7931 Secondary malignant neoplasm of brain: Secondary | ICD-10-CM | POA: Diagnosis not present

## 2017-01-12 DIAGNOSIS — Z794 Long term (current) use of insulin: Secondary | ICD-10-CM | POA: Diagnosis not present

## 2017-01-12 DIAGNOSIS — E119 Type 2 diabetes mellitus without complications: Secondary | ICD-10-CM | POA: Diagnosis not present

## 2017-01-12 DIAGNOSIS — H538 Other visual disturbances: Secondary | ICD-10-CM | POA: Diagnosis not present

## 2017-01-12 DIAGNOSIS — I959 Hypotension, unspecified: Secondary | ICD-10-CM | POA: Diagnosis not present

## 2017-01-12 DIAGNOSIS — J90 Pleural effusion, not elsewhere classified: Secondary | ICD-10-CM | POA: Diagnosis not present

## 2017-01-12 DIAGNOSIS — Z7982 Long term (current) use of aspirin: Secondary | ICD-10-CM | POA: Diagnosis not present

## 2017-01-12 DIAGNOSIS — R918 Other nonspecific abnormal finding of lung field: Secondary | ICD-10-CM | POA: Diagnosis not present

## 2017-01-12 DIAGNOSIS — R51 Headache: Secondary | ICD-10-CM | POA: Diagnosis not present

## 2017-01-12 DIAGNOSIS — Z888 Allergy status to other drugs, medicaments and biological substances status: Secondary | ICD-10-CM | POA: Diagnosis not present

## 2017-01-12 DIAGNOSIS — D720 Genetic anomalies of leukocytes: Secondary | ICD-10-CM | POA: Diagnosis not present

## 2017-01-14 ENCOUNTER — Ambulatory Visit: Payer: Medicare Other | Admitting: Internal Medicine

## 2017-01-19 ENCOUNTER — Other Ambulatory Visit: Payer: Self-pay | Admitting: Internal Medicine

## 2017-01-20 ENCOUNTER — Other Ambulatory Visit: Payer: Self-pay | Admitting: Cardiology

## 2017-01-21 DIAGNOSIS — Z510 Encounter for antineoplastic radiation therapy: Secondary | ICD-10-CM | POA: Diagnosis not present

## 2017-01-21 DIAGNOSIS — C3490 Malignant neoplasm of unspecified part of unspecified bronchus or lung: Secondary | ICD-10-CM | POA: Diagnosis not present

## 2017-01-21 DIAGNOSIS — C7931 Secondary malignant neoplasm of brain: Secondary | ICD-10-CM | POA: Diagnosis not present

## 2017-01-25 ENCOUNTER — Other Ambulatory Visit: Payer: Self-pay | Admitting: Cardiology

## 2017-01-31 DIAGNOSIS — J40 Bronchitis, not specified as acute or chronic: Secondary | ICD-10-CM | POA: Diagnosis not present

## 2017-01-31 DIAGNOSIS — J329 Chronic sinusitis, unspecified: Secondary | ICD-10-CM | POA: Diagnosis not present

## 2017-01-31 DIAGNOSIS — Z888 Allergy status to other drugs, medicaments and biological substances status: Secondary | ICD-10-CM | POA: Diagnosis not present

## 2017-01-31 DIAGNOSIS — Z7982 Long term (current) use of aspirin: Secondary | ICD-10-CM | POA: Diagnosis not present

## 2017-01-31 DIAGNOSIS — I2510 Atherosclerotic heart disease of native coronary artery without angina pectoris: Secondary | ICD-10-CM | POA: Diagnosis not present

## 2017-01-31 DIAGNOSIS — C7931 Secondary malignant neoplasm of brain: Secondary | ICD-10-CM | POA: Diagnosis not present

## 2017-01-31 DIAGNOSIS — Z794 Long term (current) use of insulin: Secondary | ICD-10-CM | POA: Diagnosis not present

## 2017-01-31 DIAGNOSIS — C3491 Malignant neoplasm of unspecified part of right bronchus or lung: Secondary | ICD-10-CM | POA: Diagnosis not present

## 2017-01-31 DIAGNOSIS — Z951 Presence of aortocoronary bypass graft: Secondary | ICD-10-CM | POA: Diagnosis not present

## 2017-01-31 DIAGNOSIS — Z955 Presence of coronary angioplasty implant and graft: Secondary | ICD-10-CM | POA: Diagnosis not present

## 2017-01-31 DIAGNOSIS — D696 Thrombocytopenia, unspecified: Secondary | ICD-10-CM | POA: Diagnosis not present

## 2017-01-31 DIAGNOSIS — Z7902 Long term (current) use of antithrombotics/antiplatelets: Secondary | ICD-10-CM | POA: Diagnosis not present

## 2017-01-31 DIAGNOSIS — E119 Type 2 diabetes mellitus without complications: Secondary | ICD-10-CM | POA: Diagnosis not present

## 2017-02-01 ENCOUNTER — Other Ambulatory Visit: Payer: Self-pay | Admitting: Cardiology

## 2017-02-03 DIAGNOSIS — C7931 Secondary malignant neoplasm of brain: Secondary | ICD-10-CM | POA: Diagnosis not present

## 2017-02-03 DIAGNOSIS — Z923 Personal history of irradiation: Secondary | ICD-10-CM | POA: Diagnosis not present

## 2017-02-03 DIAGNOSIS — C3491 Malignant neoplasm of unspecified part of right bronchus or lung: Secondary | ICD-10-CM | POA: Diagnosis not present

## 2017-02-07 DIAGNOSIS — C7931 Secondary malignant neoplasm of brain: Secondary | ICD-10-CM | POA: Diagnosis not present

## 2017-02-07 DIAGNOSIS — Z7982 Long term (current) use of aspirin: Secondary | ICD-10-CM | POA: Diagnosis not present

## 2017-02-07 DIAGNOSIS — Z006 Encounter for examination for normal comparison and control in clinical research program: Secondary | ICD-10-CM | POA: Diagnosis not present

## 2017-02-07 DIAGNOSIS — N183 Chronic kidney disease, stage 3 (moderate): Secondary | ICD-10-CM | POA: Diagnosis not present

## 2017-02-07 DIAGNOSIS — I2510 Atherosclerotic heart disease of native coronary artery without angina pectoris: Secondary | ICD-10-CM | POA: Diagnosis not present

## 2017-02-07 DIAGNOSIS — Z794 Long term (current) use of insulin: Secondary | ICD-10-CM | POA: Diagnosis not present

## 2017-02-07 DIAGNOSIS — Z888 Allergy status to other drugs, medicaments and biological substances status: Secondary | ICD-10-CM | POA: Diagnosis not present

## 2017-02-07 DIAGNOSIS — Z79899 Other long term (current) drug therapy: Secondary | ICD-10-CM | POA: Diagnosis not present

## 2017-02-07 DIAGNOSIS — Z951 Presence of aortocoronary bypass graft: Secondary | ICD-10-CM | POA: Diagnosis not present

## 2017-02-07 DIAGNOSIS — C3491 Malignant neoplasm of unspecified part of right bronchus or lung: Secondary | ICD-10-CM | POA: Diagnosis not present

## 2017-02-07 DIAGNOSIS — E119 Type 2 diabetes mellitus without complications: Secondary | ICD-10-CM | POA: Diagnosis not present

## 2017-02-07 DIAGNOSIS — R530 Neoplastic (malignant) related fatigue: Secondary | ICD-10-CM | POA: Diagnosis not present

## 2017-02-09 ENCOUNTER — Encounter: Payer: Self-pay | Admitting: Cardiology

## 2017-02-09 ENCOUNTER — Ambulatory Visit (INDEPENDENT_AMBULATORY_CARE_PROVIDER_SITE_OTHER): Payer: Medicare Other | Admitting: Cardiology

## 2017-02-09 VITALS — BP 108/64 | HR 72 | Ht 68.0 in | Wt 188.1 lb

## 2017-02-09 DIAGNOSIS — Z951 Presence of aortocoronary bypass graft: Secondary | ICD-10-CM | POA: Diagnosis not present

## 2017-02-09 DIAGNOSIS — C3490 Malignant neoplasm of unspecified part of unspecified bronchus or lung: Secondary | ICD-10-CM | POA: Diagnosis not present

## 2017-02-09 DIAGNOSIS — I5022 Chronic systolic (congestive) heart failure: Secondary | ICD-10-CM | POA: Diagnosis not present

## 2017-02-09 DIAGNOSIS — I2510 Atherosclerotic heart disease of native coronary artery without angina pectoris: Secondary | ICD-10-CM | POA: Diagnosis not present

## 2017-02-09 DIAGNOSIS — I6523 Occlusion and stenosis of bilateral carotid arteries: Secondary | ICD-10-CM | POA: Diagnosis not present

## 2017-02-09 NOTE — Patient Instructions (Signed)
Medication Instructions:  Please discontinue your Plavix and Benazepril. Continue all other medications as listed.  Follow-Up: Follow up as needed with Dr Marlou Porch.  If you need a refill on your cardiac medications before your next appointment, please call your pharmacy.  Thank you for choosing [facility]!!

## 2017-02-09 NOTE — Progress Notes (Signed)
Cardiology Office Note    Date:  02/09/2017   ID:  [name], DOB 26-Jan-1926, MRN [id number]  PCP:  Golden Circle, FNP  Cardiologist:   Candee Furbish, MD (former Ron Parker)    History of Present Illness:  [name] is a 83 y.o. male who underwent carotid endarterectomy on 06/12/2015, right sided, who later that afternoon developed chest pain with troponin of 0.54 at peak, subsequent a 0.18 and was sent to the cardiac catheterization lab at the request of Dr. Ellyn Hack however the patient was confused and declared that they had the wrong patient and did not one to procedure. Dr. Tamala Julian reviewed the patient's images from previous cardiac catheterization and did not believe there would be anatomy amenable to PCI unless there was LAD disease distal to the LIMA graft. Under this circumstance, the case was canceled and medical management was suggested unless he had recurrent or unstable symptoms.  The next day, he did well without any recurring chest discomfort and confusion had improved. Creatinine was 1.5. He was seen by Dr. Ellyn Hack, Dr. Tamala Julian, Dr. Meda Coffee during the hospital stay. Echocardiogram from 02/06/14 showed EF of 55%, mild aortic and mitral regurgitation. Prior akinesis of the basal inferior segment as well as inferolateral segments. Line status has remained quite stable.  Last cardiac catheterization was 02/05/14 by Dr. Claiborne Billings. Dr. Tamala Julian reviewed these results carefully.  He is a Artist.  02/09/17-since last visit, he has been diagnosed with metastatic lung cancer, brain metastasis[facility]. Feeling weak. In wheelchair currently but still able to walk. Going to be starting chemotherapy soon according to his son. Denies any chest pain, shortness of breath. Original symptom was left I abnormality with vision, double vision.   Past Medical History:  Diagnosis Date  . Abnormal LFTs    LFTs were abnormal with acute MI March, 2013, improving at the time of discharge   . Aortic insufficiency    Moderate, echo, March, 2012  . Arthritis    hands  . Bradycardia    Sinus bradycardia  . CAD (coronary artery disease)   . Diabetes mellitus   . Dizziness     12/2009 & 12/ 2012  . Dyslipidemia   . Ejection fraction    45%, echo, March, 2012,   /  Ejection fraction after his MI March, 2013 is 45%. There is posterior basal akinesis with abnormal septal motion.  . Elevated CPK    2013, now in 2014 patient on very low dose Crestor  . Hard of hearing   . Hx of CABG   . Hyperlipidemia   . Hypertension   . Hypothyroidism   . Ischemia    .Marland KitchenMarland KitchenEF 42%  . Leg cramps    history of nighttime  . Mitral regurgitation    Moderate, echo, March, 2012  . Myocardial infarction ([place]) 3/19-27/2013   ARF, elevated LFTs  . Nocturia   . Pulmonary hypertension ([place])    Echo, March, 2012, 42 mmHg  . RBBB (right bundle branch block with left anterior fascicular block)    New July, 2010  . Shingles October 2011   Severe pain to right flank treated  . Systolic CHF ([place])   . Wears glasses     Past Surgical History:  Procedure Laterality Date  . BACK SURGERY  1999   LS sx- 1999  . CARDIAC CATHETERIZATION  2013   occluded graft; Dr Lia Foyer  . CARDIAC SURGERY    . CORONARY ARTERY BYPASS GRAFT  1996   5 vessels  . ENDARTERECTOMY Right 06/12/2015   Procedure: ENDARTERECTOMY CAROTID WITH PATCH ANGIOPLASTY;  Surgeon: Serafina Mitchell, MD;  Location: [facility];  Service: Vascular;  Laterality: Right;  . EYE SURGERY Bilateral December 2016   bilateral cataract surgery  . LEFT HEART CATHETERIZATION WITH CORONARY ANGIOGRAM N/A 08/03/2011   Procedure: LEFT HEART CATHETERIZATION WITH CORONARY ANGIOGRAM;  Surgeon: Hillary Bow, MD;  Location: [facility] CATH LAB;  Service: Cardiovascular;  Laterality: N/A;  . LEFT HEART CATHETERIZATION WITH CORONARY/GRAFT ANGIOGRAM N/A 02/05/2014   Procedure: LEFT HEART CATHETERIZATION WITH Beatrix Fetters;  Surgeon: Troy Sine, MD;  Location: [facility]  CATH LAB;  Service: Cardiovascular;  Laterality: N/A;  . SPINAL FUSION  2000   scar tissue, plate insertion/fusion @ LS spine - 2000    Outpatient Medications Prior to Visit  Medication Sig Dispense Refill  . acetaminophen (TYLENOL) 650 MG CR tablet Take 650-1,300 mg by mouth every 8 (eight) hours as needed for pain.    Marland Kitchen aspirin EC 81 MG tablet Take 81 mg by mouth daily.     . B-D INS SYRINGE 0.5CC/31GX5/16 31G X 5/16" 0.5 ML MISC USE TO INJECT INSULIN VIAL. 100 each 3  . bimatoprost (LUMIGAN) 0.01 % SOLN Place 1 drop into both eyes at bedtime.    . brinzolamide (AZOPT) 1 % ophthalmic suspension Place 1 drop into both eyes daily.    . cholecalciferol (VITAMIN D) 1000 UNITS tablet Take 2,000 Units by mouth daily.     . furosemide (LASIX) 40 MG tablet TAKE 1/2 TABLET BY MOUTH DAILY AS NEEDED (Patient taking differently: TAKE 1/2 TABLET (20 MG) BY MOUTH DAILY AS NEEDED FOR FEET AND ANKLE SWELLING) 30 tablet 1  . glipiZIDE (GLUCOTROL) 10 MG tablet Take 1 tablet in the morning (Patient taking differently: Take 10 mg by mouth daily before breakfast. ) 90 tablet 1  . insulin NPH-regular Human (NOVOLIN 70/30) (70-30) 100 UNIT/ML injection Inject 35 Units into the skin 2 (two) times daily before a meal. (Patient taking differently: Inject 25 Units into the skin 2 (two) times daily before a meal. ) 15 mL 11  . Insulin Syringe-Needle U-100 (BD INSULIN SYRINGE ULTRAFINE) 31G X 5/16" 0.5 ML MISC Use to inject insulin vial. 100 each 0  . isosorbide mononitrate (IMDUR) 30 MG 24 hr tablet TAKE 1 TABLET BY MOUTH EVERY DAY (Patient taking differently: TAKE 1 TABLET (30 MG) BY MOUTH EVERY DAY) 30 tablet 3  . levothyroxine (SYNTHROID, LEVOTHROID) 50 MCG tablet TAKE ONE TABLET BY MOUTH ONCE DAILY BEFORE BREAKFAST (Patient taking differently: TAKE ONE TABLET ( 50 MCG) BY MOUTH ONCE DAILY BEFORE BREAKFAST) 90 tablet 1  . nitroGLYCERIN (NITROSTAT) 0.4 MG SL tablet Place 0.4 mg under the tongue every 5 (five) minutes as  needed for chest pain.    . NONFORMULARY OR COMPOUNDED ITEM Diabetic testing supply Check glucose once daily if possible Fasting or morning glucose recommended M, W, F, & Sun if possible Goal= 100-150 Glucose 2 hours after breakfast Tue, after lunch Th & 2 hrs after eve meal Sat if possible Goal = < 180 1 each 0  . polyvinyl alcohol (ARTIFICIAL TEARS) 1.4 % ophthalmic solution Place 1 drop into both eyes daily as needed for dry eyes.    . rosuvastatin (CRESTOR) 10 MG tablet Take 10 mg by mouth See admin instructions. Take 1 tablet (10 mg) by mouth twice a week - Sunday and Wednesday mornings  1  . benazepril (LOTENSIN) 40 MG tablet  TAKE 1 TABLET BY MOUTH EVERY DAY 30 tablet 0  . clopidogrel (PLAVIX) 75 MG tablet TAKE 1 TABLET BY MOUTH EVERY DAY 30 tablet 0  . amLODipine (NORVASC) 10 MG tablet TAKE 1 TABLET BY MOUTH EVERY DAY 30 tablet 0  . carvedilol (COREG) 3.125 MG tablet Take 1 tablet (3.125 mg total) by mouth 2 (two) times daily with a meal. (Patient taking differently: Take 3.125 mg by mouth daily. ) 60 tablet 11   No facility-administered medications prior to visit.    Stopping benazepril and Plavix   Allergies:   Atorvastatin; Folic acid; Nicardipine hcl; and Pravastatin   Social History   Social History  . Marital status: Married    Spouse name: N/A  . Number of children: N/A  . Years of education: N/A   Occupational History  . [employer]   Social History Main Topics  . Smoking status: Former Smoker    Packs/day: 1.00    Years: 5.00    Quit date: 05/17/1949  . Smokeless tobacco: Never Used     Comment: Quit at age 28  . Alcohol use No  . Drug use: No  . Sexual activity: No   Other Topics Concern  . None   Social History Narrative   Pt lives with wife.     Family History:  The patient's family history includes Diabetes in his brother and sister; Heart attack (age of onset: 30) in his father; Hyperlipidemia in his mother; Hypertension in his  son; Lung cancer in his sister.   ROS:   Please see the history of present illness.    Review of Systems  All other systems reviewed and are negative.   PHYSICAL EXAM:   VS:  BP 108/64   Pulse 72   Ht 5\' 8"  (1.727 m)   Wt 188 lb 1.9 oz (85.3 kg)   BMI 28.60 kg/m    GEN: Elderly in wheelchair, in no acute distress  HEENT: normal  Neck: no JVD, carotid bruits, or masses Cardiac: RRR; no murmurs, rubs, or gallops,no edema  Respiratory:  clear to auscultation bilaterally, normal work of breathing GI: soft, nontender, nondistended, + BS MS: no deformity or atrophy  Skin: warm and dry, no rash Neuro:  Alert and Oriented x 3, Strength and sensation are intact Psych: euthymic mood, full affect  Wt Readings from Last 3 Encounters:  02/09/17 188 lb 1.9 oz (85.3 kg)  12/16/16 186 lb (84.4 kg)  10/08/16 191 lb (86.6 kg)      Studies/Labs Reviewed:   EKG:  EKG is not ordered today.    Recent Labs: 12/24/2016: BUN 21; Creatinine, Ser 1.54; Hemoglobin 14.1; Platelets 240; Potassium 5.0; Sodium 141   Lipid Panel    Component Value Date/Time   CHOL 145 06/10/2016 1323   TRIG 110.0 06/10/2016 1323   HDL 32.10 (L) 06/10/2016 1323   CHOLHDL 5 06/10/2016 1323   VLDL 22.0 06/10/2016 1323   LDLCALC 91 06/10/2016 1323   LDLDIRECT 164.1 03/24/2012 1353    Additional studies/ records that were reviewed today include:  Hospital records reviewed, lab work reviewed    ASSESSMENT:    1. Chronic systolic CHF (congestive heart failure) ([place])   2. Hx of CABG   3. Coronary artery disease involving native coronary artery of native heart without angina pectoris   4. Malignant neoplasm of lung, unspecified laterality, unspecified part of lung ([place])      PLAN:  In order of problems listed above:  1. Mildly elevated troponin in the setting of carotid endarterectomy as well as chest pain postoperative day 2. He is no longer having the chest discomfort. He feels great.  Doing well. More in  line with demand ischemia given his underlying CAD. Continue with aggressive secondary prevention.No changes in symptoms. 2. Carotid endarterectomy, right sided, Dr. Wilhelmenia Blase strokelike symptoms 3. Prior bypass surgery, last cardiac catheterization in 2015 reviewed carefully by Dr. Tamala Julian in the cardiac catheterization lab. Continuing with medical management. There was distal LAD disease, there was occluded vein graft supplying circumflex, there was occluded vein graft to the diagonal. Medical management. Continue with current management. Patient did not have any further chest discomfort on day 2. Had some confusion postoperatively.  Noninvasive approach. 4. 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 Had gamma knife procedure. He is about to undergo chemotherapy. [facility]. Currently in wheelchair. He is able to walk but he is quite weak. Left lower extremity edema noted. I mentioned to them that if Lasix and elevation does not seem to help, they may wish to mention to the oncology team the possibility of DVT. 5. Hypotension - I'm comfortable with discontinuation of benazepril. His amlodipine and carvedilol have both been stopped. I have also stopped his Plavix given the potential for bleeding. He is easy bruising at this point. 6. PRN follow up.      Medication Adjustments/Labs and Tests Ordered: Current medicines are reviewed at length with the patient today.  Concerns regarding medicines are outlined above.  Medication changes, Labs and Tests ordered today are listed in the Patient Instructions below. Patient Instructions  Medication Instructions:  Please discontinue your Plavix and Benazepril. Continue all other medications as listed.  Follow-Up: Follow up as needed with Dr Marlou Porch.  If you need a refill on your cardiac medications before your next appointment, please call your pharmacy.  Thank you for choosing [facility]!!          Signed, Candee Furbish, MD  02/09/2017 3:13 PM    [facility] [address], [place], [place]  [address] Phone: [phone]; Fax: [phone]

## 2017-02-11 ENCOUNTER — Other Ambulatory Visit: Payer: Self-pay | Admitting: Cardiology

## 2017-02-18 ENCOUNTER — Other Ambulatory Visit: Payer: Self-pay | Admitting: Cardiology

## 2017-02-18 MED ORDER — ISOSORBIDE MONONITRATE ER 30 MG PO TB24: 30.0000 mg | ORAL_TABLET | Freq: Every day | ORAL | 3 refills | Status: AC

## 2017-02-18 MED ORDER — FUROSEMIDE 40 MG PO TABS: ORAL_TABLET | ORAL | 3 refills | Status: AC

## 2017-02-21 DIAGNOSIS — C7931 Secondary malignant neoplasm of brain: Secondary | ICD-10-CM | POA: Diagnosis not present

## 2017-02-21 DIAGNOSIS — Z23 Encounter for immunization: Secondary | ICD-10-CM | POA: Diagnosis not present

## 2017-02-21 DIAGNOSIS — Z923 Personal history of irradiation: Secondary | ICD-10-CM | POA: Diagnosis not present

## 2017-02-21 DIAGNOSIS — Z5111 Encounter for antineoplastic chemotherapy: Secondary | ICD-10-CM | POA: Diagnosis not present

## 2017-02-21 DIAGNOSIS — Z006 Encounter for examination for normal comparison and control in clinical research program: Secondary | ICD-10-CM | POA: Diagnosis not present

## 2017-02-21 DIAGNOSIS — I2510 Atherosclerotic heart disease of native coronary artery without angina pectoris: Secondary | ICD-10-CM | POA: Diagnosis not present

## 2017-02-21 DIAGNOSIS — Z794 Long term (current) use of insulin: Secondary | ICD-10-CM | POA: Diagnosis not present

## 2017-02-21 DIAGNOSIS — E119 Type 2 diabetes mellitus without complications: Secondary | ICD-10-CM | POA: Diagnosis not present

## 2017-02-21 DIAGNOSIS — Z888 Allergy status to other drugs, medicaments and biological substances status: Secondary | ICD-10-CM | POA: Diagnosis not present

## 2017-02-21 DIAGNOSIS — D499 Neoplasm of unspecified behavior of unspecified site: Secondary | ICD-10-CM | POA: Diagnosis not present

## 2017-02-21 DIAGNOSIS — Z79899 Other long term (current) drug therapy: Secondary | ICD-10-CM | POA: Diagnosis not present

## 2017-02-21 DIAGNOSIS — Z7982 Long term (current) use of aspirin: Secondary | ICD-10-CM | POA: Diagnosis not present

## 2017-02-21 DIAGNOSIS — C3491 Malignant neoplasm of unspecified part of right bronchus or lung: Secondary | ICD-10-CM | POA: Diagnosis not present

## 2017-02-21 DIAGNOSIS — Z951 Presence of aortocoronary bypass graft: Secondary | ICD-10-CM | POA: Diagnosis not present

## 2017-02-21 DIAGNOSIS — Z7902 Long term (current) use of antithrombotics/antiplatelets: Secondary | ICD-10-CM | POA: Diagnosis not present

## 2017-02-21 DIAGNOSIS — Z5112 Encounter for antineoplastic immunotherapy: Secondary | ICD-10-CM | POA: Diagnosis not present

## 2017-02-23 ENCOUNTER — Other Ambulatory Visit: Payer: Self-pay | Admitting: Cardiology

## 2017-02-23 NOTE — Telephone Encounter (Signed)
AVS Reports   Date/Time Report Action User  02/09/2017 3:12 PM After Visit Summary Printed Shellia Cleverly, RN  Patient Instructions   Medication Instructions:  Please discontinue your Plavix and Benazepril.

## 2017-02-25 DIAGNOSIS — I469 Cardiac arrest, cause unspecified: Secondary | ICD-10-CM | POA: Diagnosis not present

## 2017-03-17 DIAGNOSIS — 419620001 Death: Secondary | SNOMED CT | POA: Diagnosis not present

## 2017-03-17 DEATH — deceased

## 2017-06-28 ENCOUNTER — Encounter: Payer: Self-pay | Admitting: Cardiology

## 2017-09-05 ENCOUNTER — Ambulatory Visit: Payer: Medicare Other | Admitting: Family

## 2017-09-05 ENCOUNTER — Encounter (HOSPITAL_COMMUNITY): Payer: Medicare Other

## 2019-05-23 IMAGING — MR MR HEAD W/O CM
9 of 10 series · 34 of 48 positions shown · non-contrast
Comparison: CT HEAD December 24, 2016

CLINICAL DATA: Blurry vision, chronic LEFT eye pain. History of
hypertension, diabetes.

EXAM:
MRI HEAD WITHOUT CONTRAST
TECHNIQUE: Multiplanar, multiecho pulse sequences of the brain and surrounding
structures were obtained without intravenous contrast.

[Series 3: DWI · axial · 3.0mm · 0.94mm/px · z∈[-68,+84]mm · 8 of 104 slices shown (1 of 2)]
[im 1/104]
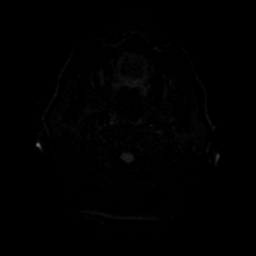
[im 12/104]
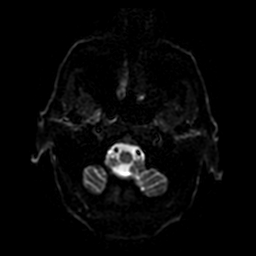
[im 35/104]
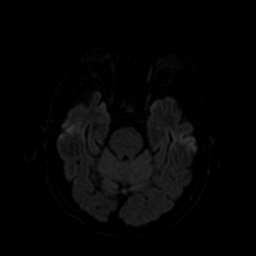
[im 46/104]
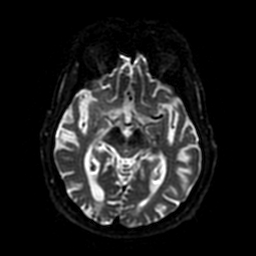
[im 58/104]
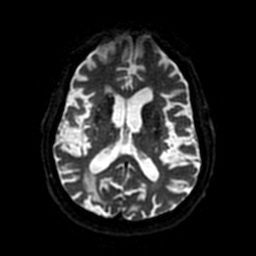
[im 69/104]
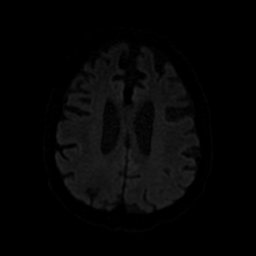
[im 92/104]
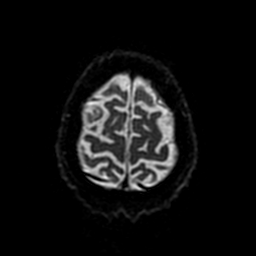
[im 104/104]
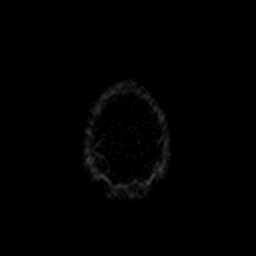

[Series 4: DWI · coronal · 4.0mm · 0.94mm/px · 7 of 76 slices shown (2 of 2)]
[im 1/76]
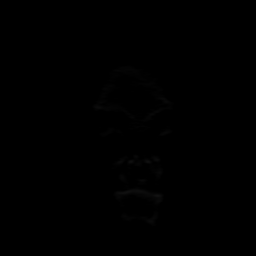
[im 13/76]
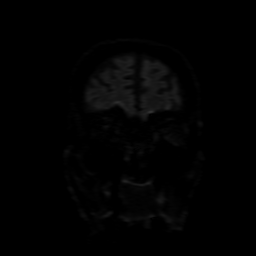
[im 26/76]
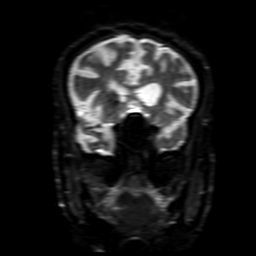
[im 38/76]
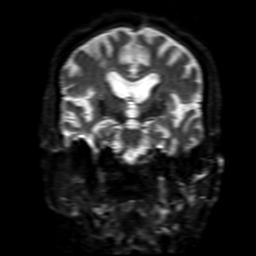
[im 51/76]
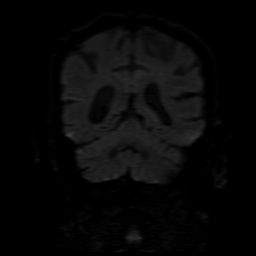
[im 63/76]
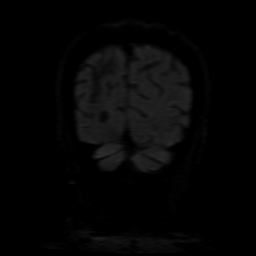
[im 76/76]
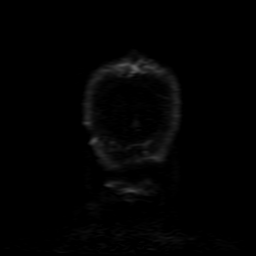

[Series 5: FLAIR · sagittal · 5.0mm · 0.47mm/px · 2 of 25 slices shown (1 of 2)]
[im 1/25]
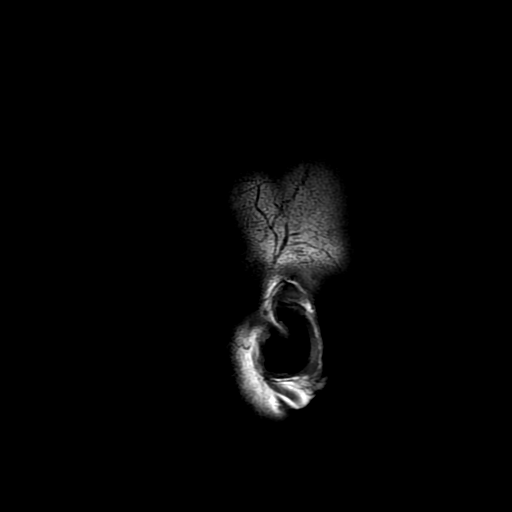
[im 25/25]
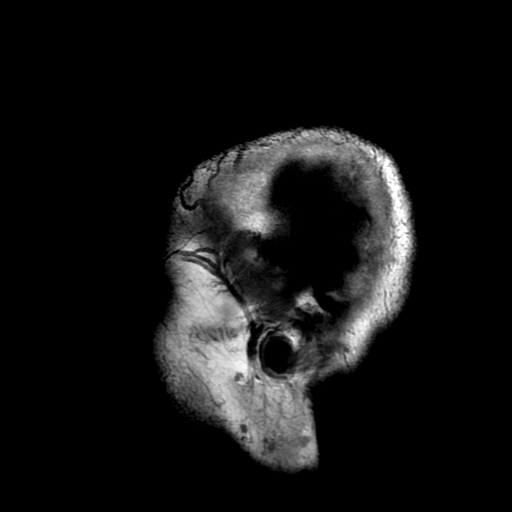

[Series 6: T2 · axial · 5.0mm · 0.47mm/px · z∈[-65,+84]mm · 2 of 26 slices shown (1 of 2)]
[im 1/26]
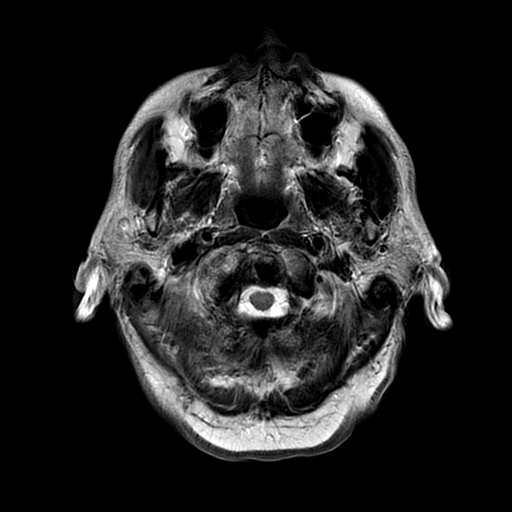
[im 26/26]
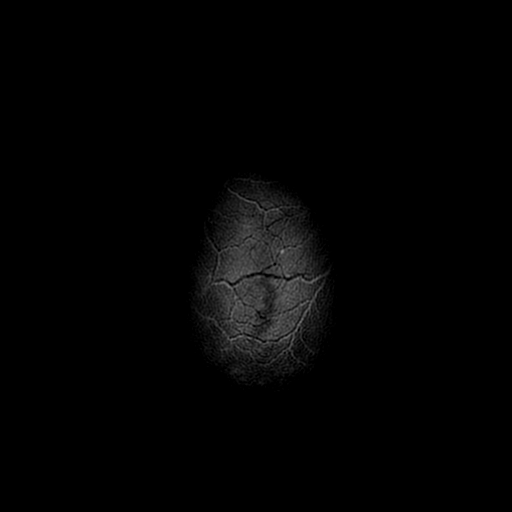

[Series 7: FLAIR · axial · 5.0mm · 0.47mm/px · z∈[-65,+84]mm · 2 of 26 slices shown (2 of 2)]
[im 1/26]
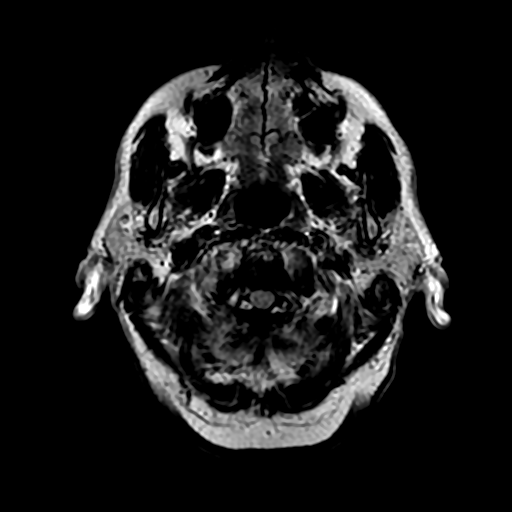
[im 26/26]
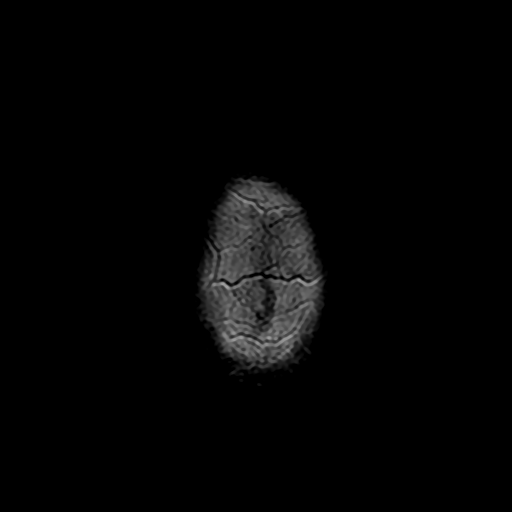

[Series 8: (person_name) · axial · 3.0mm · 0.47mm/px · z∈[-67,-49]mm · 2 of 104 slices shown]
[im 1/104]
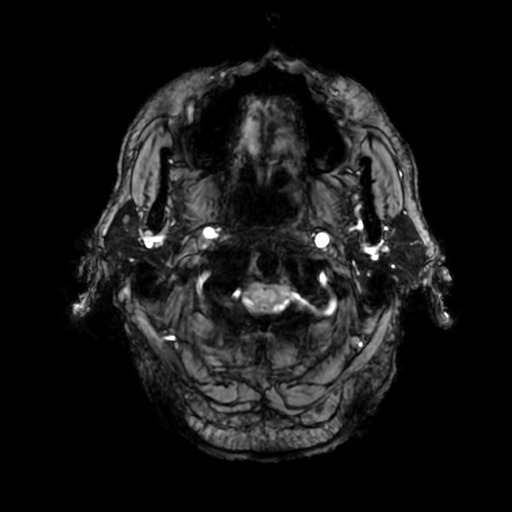
[im 13/104]
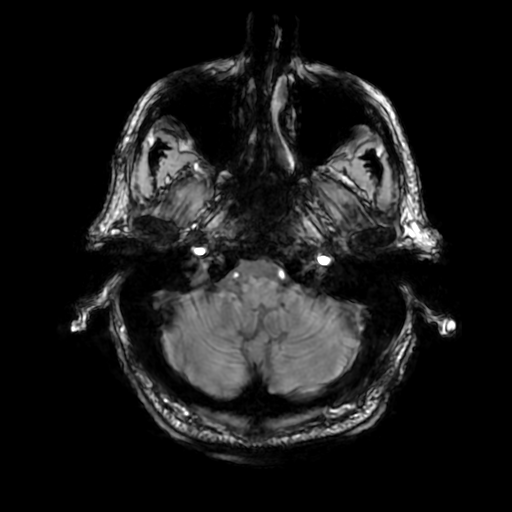

[Series 10: T2 · coronal · 5.0mm · 0.41mm/px · 3 of 31 slices shown (2 of 2)]
[im 1/31]
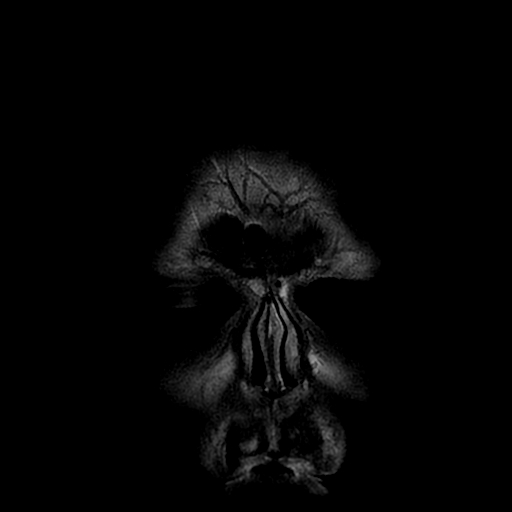
[im 16/31]
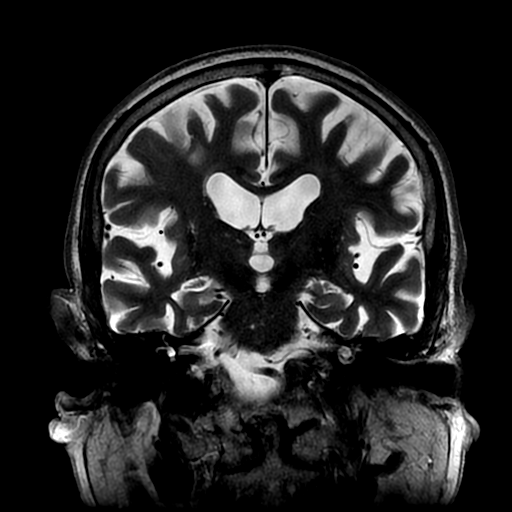
[im 31/31]
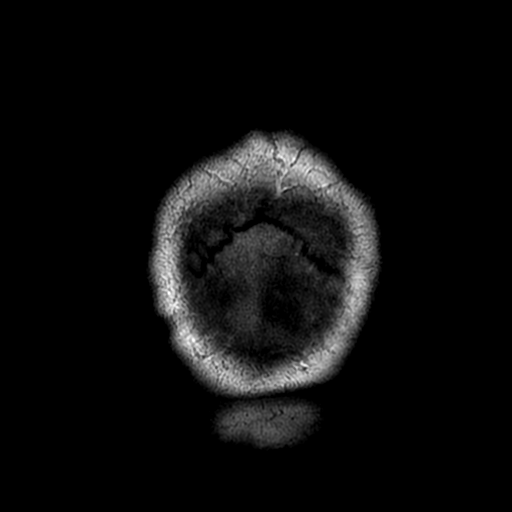

[Series 350: ADC · axial · 3.0mm · 0.94mm/px · z∈[-68,+84]mm · 5 of 52 slices shown (1 of 2)]
[im 1/52]
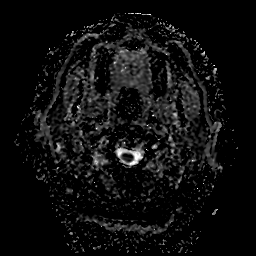
[im 13/52]
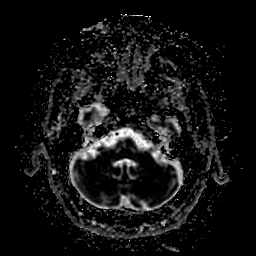
[im 26/52]
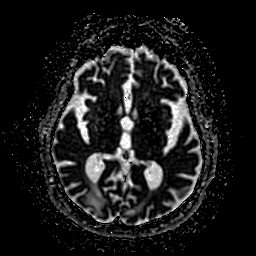
[im 39/52]
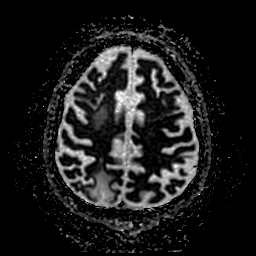
[im 52/52]
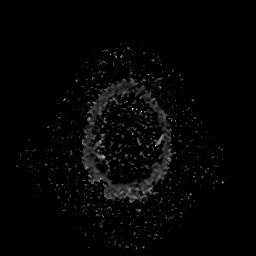

[Series 450: ADC · coronal · 4.0mm · 0.94mm/px · 3 of 38 slices shown (2 of 2)]
[im 1/38]
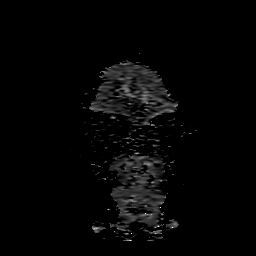
[im 19/38]
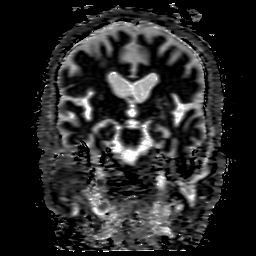
[im 38/38]
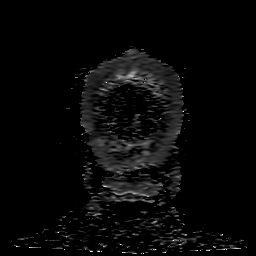

[34 of 48 positions shown; findings below may reference images not displayed]

FINDINGS: BRAIN: No reduced diffusion to suggest acute ischemia. No
susceptibility artifact to suggest hemorrhage. Old small bilateral
cerebellar infarcts with superimposed faint susceptibility artifact
RIGHT cerebellum compatible with mineralization. RIGHT
parietoccipital lobe encephalomalacia with faint susceptibility
artifact. Small area LEFT parietoccipital and RIGHT frontal parent
vasogenic edema with subcentimeter cysts. Old RIGHT basal ganglia
lacunar infarct. Few scattered subcentimeter supratentorial white
matter FLAIR T2 hyperintensities are less than expected for age,
compatible with mild chronic small vessel ischemic disease.
Ventricles and sulci are normal for patient's age. No midline shift
from mass-effect, no masses. No abnormal extra-axial fluid
collections.

VASCULAR: Normal major intracranial vascular flow voids present at
skull base.

SKULL AND UPPER CERVICAL SPINE: No abnormal sellar expansion.
Abnormal low T1, intermediate FLAIR signal LEFT 5 a/central
skullbase. Craniocervical junction maintained.

SINUSES/ORBITS: The mastoid air-cells and included paranasal sinuses
are well-aerated. The included ocular globes and orbital contents
are non-suspicious. Status post bilateral ocular lens implants.

OTHER: None.
IMPRESSION: 1. No acute intracranial process.

2. RIGHT frontal and LEFT parietoccipital lobe vasogenic edema and
suspected small cystic metastasis. LEFT clival/ central skullbase
metastasis. Recommend MRI of the brain with contrast.
3. Old RIGHT parietoccipital lobe infarcts. Old cerebellar infarcts.
Old RIGHT basal ganglia lacunar infarct.
Acute findings discussed with and reconfirmed by.NOMASIBULELE MOATSHE
on 12/25/2016 at [DATE].

## 2019-05-23 IMAGING — DX DG CHEST 2V
2 series · 2 of 2 positions shown · non-contrast
Comparison: Chest radiograph October 12, 2014

CLINICAL DATA: Fell today, weakness. History of pulmonary
hypertension.

EXAM:
CHEST  2 VIEW

[chest lat]
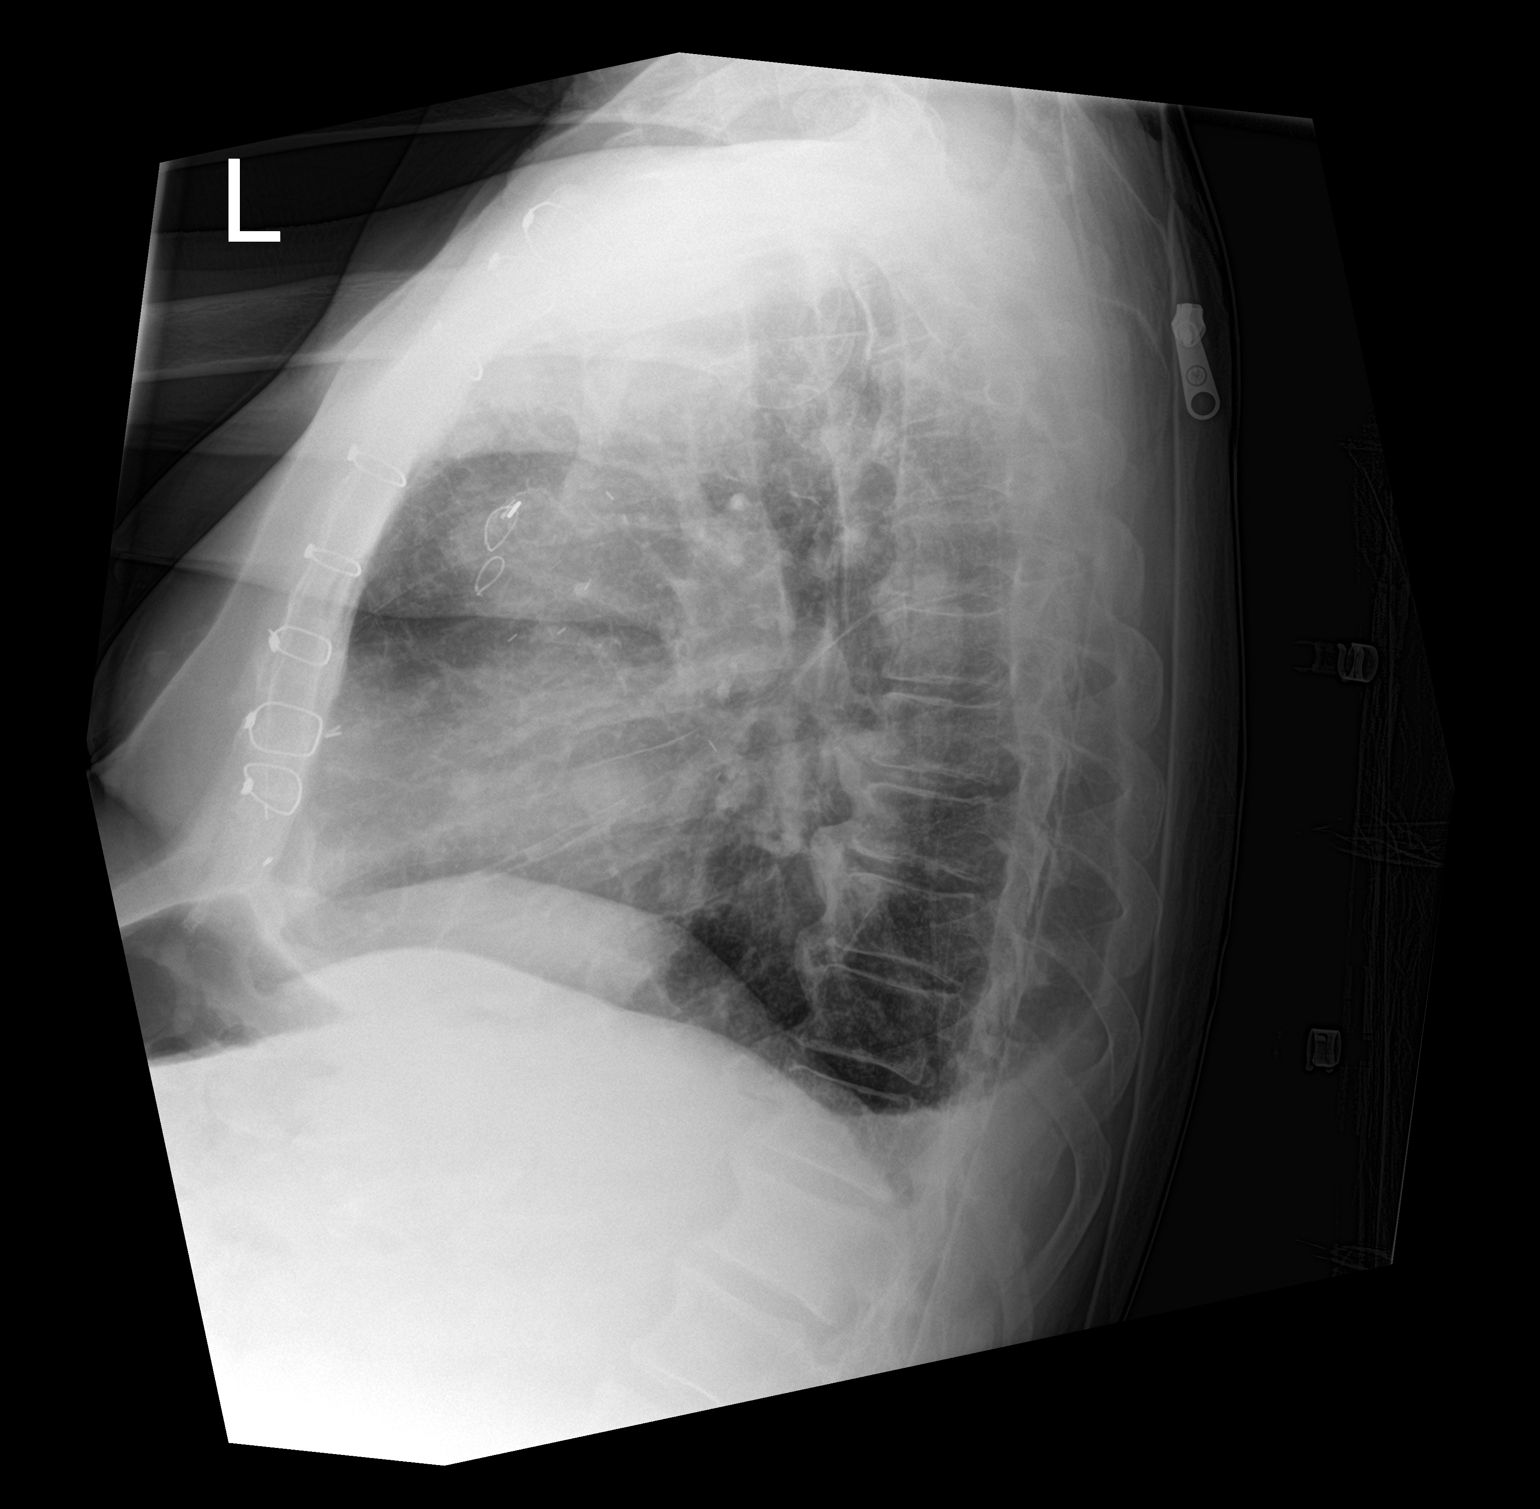

[chest ap]
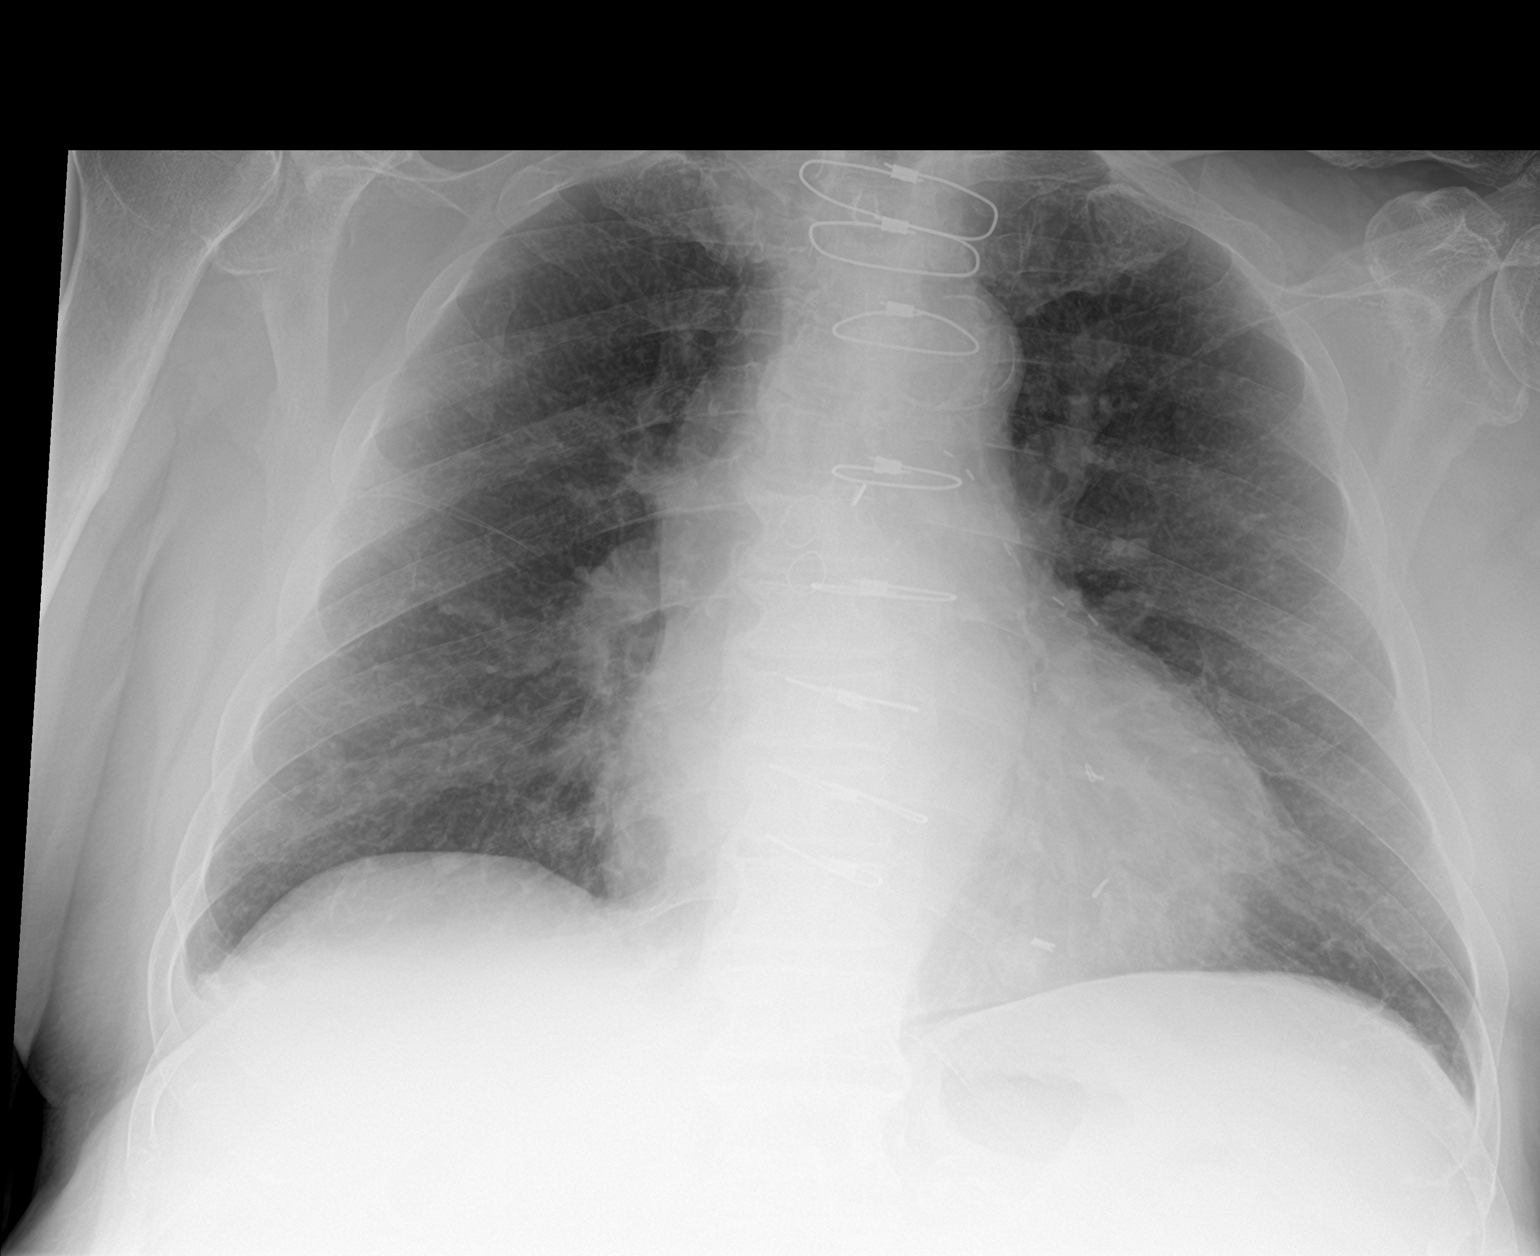

[2 of 2 positions shown; findings below may reference images not displayed]

FINDINGS: The cardiac silhouette is mild-to-moderately enlarged, unchanged.
Tortuous calcified aorta. Blunting of the RIGHT costophrenic angle.
Trachea projects midline and there is no pneumothorax. Mild
bronchitic changes. Status post median sternotomy for CABG. Soft
tissue planes and included osseous structures are nonsuspicious.
IMPRESSION: Small RIGHT pleural effusion.  Mild bronchitic changes.

Stable cardiomegaly.

Aortic Atherosclerosis ([id number]).
